# Patient Record
Sex: Female | Born: 1965 | Race: Black or African American | Hispanic: No | Marital: Single | Smoking: Former smoker
Health system: Southern US, Community
[De-identification: ages and names within clinical notes are randomized; demographics above are authoritative.]

## PROBLEM LIST (undated history)

## (undated) DIAGNOSIS — R011 Cardiac murmur, unspecified: Secondary | ICD-10-CM

## (undated) DIAGNOSIS — F32A Depression, unspecified: Secondary | ICD-10-CM

## (undated) DIAGNOSIS — F419 Anxiety disorder, unspecified: Secondary | ICD-10-CM

## (undated) DIAGNOSIS — G629 Polyneuropathy, unspecified: Secondary | ICD-10-CM

## (undated) DIAGNOSIS — M1990 Unspecified osteoarthritis, unspecified site: Secondary | ICD-10-CM

## (undated) DIAGNOSIS — E785 Hyperlipidemia, unspecified: Secondary | ICD-10-CM

## (undated) DIAGNOSIS — I219 Acute myocardial infarction, unspecified: Secondary | ICD-10-CM

## (undated) DIAGNOSIS — I2510 Atherosclerotic heart disease of native coronary artery without angina pectoris: Secondary | ICD-10-CM

## (undated) DIAGNOSIS — K219 Gastro-esophageal reflux disease without esophagitis: Secondary | ICD-10-CM

## (undated) DIAGNOSIS — K449 Diaphragmatic hernia without obstruction or gangrene: Secondary | ICD-10-CM

## (undated) DIAGNOSIS — I10 Essential (primary) hypertension: Secondary | ICD-10-CM

## (undated) DIAGNOSIS — Z720 Tobacco use: Secondary | ICD-10-CM

## (undated) DIAGNOSIS — E079 Disorder of thyroid, unspecified: Secondary | ICD-10-CM

## (undated) DIAGNOSIS — I714 Abdominal aortic aneurysm, without rupture, unspecified: Secondary | ICD-10-CM

## (undated) DIAGNOSIS — E119 Type 2 diabetes mellitus without complications: Secondary | ICD-10-CM

## (undated) DIAGNOSIS — R232 Flushing: Secondary | ICD-10-CM

## (undated) DIAGNOSIS — N189 Chronic kidney disease, unspecified: Secondary | ICD-10-CM

## (undated) DIAGNOSIS — C801 Malignant (primary) neoplasm, unspecified: Secondary | ICD-10-CM

## (undated) HISTORY — PX: ABDOMINAL AORTIC ANEURYSM REPAIR: SUR1152

## (undated) HISTORY — DX: Malignant (primary) neoplasm, unspecified: C80.1

## (undated) HISTORY — DX: Depression, unspecified: F32.A

## (undated) HISTORY — DX: Anxiety disorder, unspecified: F41.9

## (undated) HISTORY — DX: Hyperlipidemia, unspecified: E78.5

## (undated) HISTORY — DX: Gastro-esophageal reflux disease without esophagitis: K21.9

## (undated) HISTORY — DX: Unspecified osteoarthritis, unspecified site: M19.90

## (undated) HISTORY — DX: Chronic kidney disease, unspecified: N18.9

## (undated) HISTORY — DX: Abdominal aortic aneurysm, without rupture, unspecified: I71.40

## (undated) HISTORY — DX: Type 2 diabetes mellitus without complications: E11.9

## (undated) HISTORY — DX: Flushing: R23.2

## (undated) HISTORY — DX: Cardiac murmur, unspecified: R01.1

## (undated) HISTORY — PX: BREAST BIOPSY: SHX20

## (undated) HISTORY — DX: Diaphragmatic hernia without obstruction or gangrene: K44.9

## (undated) HISTORY — DX: Disorder of thyroid, unspecified: E07.9

---

## 1984-10-20 HISTORY — PX: BREAST SURGERY: SHX581

## 1998-10-20 HISTORY — PX: SALPINGOOPHORECTOMY: SHX82

## 2005-05-10 ENCOUNTER — Emergency Department: Payer: Self-pay | Admitting: Emergency Medicine

## 2005-05-10 ENCOUNTER — Other Ambulatory Visit: Payer: Self-pay

## 2005-11-23 ENCOUNTER — Emergency Department: Payer: Self-pay | Admitting: Emergency Medicine

## 2006-08-31 ENCOUNTER — Emergency Department: Payer: Self-pay | Admitting: Emergency Medicine

## 2006-09-30 ENCOUNTER — Other Ambulatory Visit: Payer: Self-pay

## 2006-10-07 ENCOUNTER — Ambulatory Visit: Payer: Self-pay | Admitting: Vascular Surgery

## 2006-10-19 ENCOUNTER — Emergency Department: Payer: Self-pay | Admitting: General Practice

## 2007-12-08 ENCOUNTER — Emergency Department: Payer: Self-pay | Admitting: Internal Medicine

## 2008-03-20 ENCOUNTER — Emergency Department (HOSPITAL_COMMUNITY): Admission: EM | Admit: 2008-03-20 | Discharge: 2008-03-20 | Payer: Self-pay | Admitting: Emergency Medicine

## 2008-07-10 ENCOUNTER — Emergency Department: Payer: Self-pay | Admitting: Emergency Medicine

## 2008-07-10 ENCOUNTER — Other Ambulatory Visit: Payer: Self-pay

## 2008-09-27 ENCOUNTER — Emergency Department: Payer: Self-pay | Admitting: Emergency Medicine

## 2008-10-08 ENCOUNTER — Inpatient Hospital Stay: Payer: Self-pay | Admitting: *Deleted

## 2008-11-02 ENCOUNTER — Ambulatory Visit (HOSPITAL_COMMUNITY): Admission: RE | Admit: 2008-11-02 | Discharge: 2008-11-02 | Payer: Self-pay | Admitting: Obstetrics & Gynecology

## 2008-11-30 ENCOUNTER — Ambulatory Visit: Payer: Self-pay | Admitting: Obstetrics & Gynecology

## 2009-02-15 ENCOUNTER — Ambulatory Visit: Payer: Self-pay | Admitting: Family Medicine

## 2009-02-20 ENCOUNTER — Emergency Department: Payer: Self-pay | Admitting: Internal Medicine

## 2009-05-03 ENCOUNTER — Ambulatory Visit: Payer: Self-pay | Admitting: Obstetrics and Gynecology

## 2009-05-22 ENCOUNTER — Emergency Department: Payer: Self-pay | Admitting: Emergency Medicine

## 2009-07-19 ENCOUNTER — Ambulatory Visit: Payer: Self-pay | Admitting: Obstetrics & Gynecology

## 2009-10-04 ENCOUNTER — Ambulatory Visit: Payer: Self-pay | Admitting: Obstetrics & Gynecology

## 2009-12-20 ENCOUNTER — Ambulatory Visit: Payer: Self-pay | Admitting: Obstetrics and Gynecology

## 2010-02-19 ENCOUNTER — Emergency Department: Payer: Self-pay | Admitting: Unknown Physician Specialty

## 2010-03-13 ENCOUNTER — Ambulatory Visit: Payer: Self-pay | Admitting: Obstetrics and Gynecology

## 2010-03-13 LAB — CONVERTED CEMR LAB: Pap Smear: NEGATIVE

## 2010-03-26 ENCOUNTER — Ambulatory Visit (HOSPITAL_COMMUNITY): Admission: RE | Admit: 2010-03-26 | Discharge: 2010-03-26 | Payer: Self-pay | Admitting: Family Medicine

## 2010-05-29 ENCOUNTER — Ambulatory Visit: Payer: Self-pay | Admitting: Obstetrics & Gynecology

## 2011-03-04 NOTE — Group Therapy Note (Signed)
Heather Mahoney, DEASON NO.:  [id number]   MEDICAL RECORD NO.:  [id number]          PATIENT TYPE:  WOC   LOCATION:  [facility]                   FACILITY:  [facility]   PHYSICIAN:  Allie Bossier, MD        DATE OF BIRTH:  May 30, 1966   DATE OF SERVICE:                                  CLINIC NOTE   Ms. Emerick presents today for her Depo shot.  However, on evaluation,  her blood pressure was found to be 165/110 with a repeat 160/100.  I was  asked to evaluate the patient for elevated blood pressures.  The patient  relates a long history of elevated blood pressures with the use of  medications on occasion previously.  However, due to her social  situation and no current insurance, she has not been to the doctor or  been able to get medications anytime recently.  She denies any  headaches, dizziness, chest pain, or shortness of breath.   PHYSICAL EXAMINATION:  VITAL SIGNS:  Blood pressure is as noted.  NECK:  Her thyroid is normal size without nodules noted.  LUNGS:  Clear to auscultation.  CARDIOVASCULAR:  Regular without murmur.  EXTREMITIES:  She has no peripheral edema.   PAST MEDICAL HISTORY:  Significant for hypertension.   PAST SURGICAL HISTORY:  She has had last laparoscopy and laparotomy.   ALLERGIES:  She is not allergic any medications.   FAMILY HISTORY:  Diabetes, hypertension.   SOCIAL HISTORY:  She smokes one-half pack per day and denies alcohol  use.   ASSESSMENT AND PLAN:  Essential hypertension.  We will prescribe the  patient Tenoretic 50/25, this is on the [employer], 4 dollars, she will  take pill a day.  I strongly recommended to her that once her benefits  kick in, she find a primary care Cathie Bonnell for further management of this  issue.     ______________________________  Odie Sera, D.O.    ______________________________  Allie Bossier, MD    MC/MEDQ  D:  02/15/2009  T:  02/16/2009  Job:  [id number]

## 2011-03-04 NOTE — Group Therapy Note (Signed)
Heather Mahoney, GIBLER NO.:  [id number]   MEDICAL RECORD NO.:  [id number]          PATIENT TYPE:  WOC   LOCATION:  [facility]                   FACILITY:  [facility]   PHYSICIAN:  Scheryl Darter, MD       DATE OF BIRTH:  12/03/1965   DATE OF SERVICE:  11/30/2008                                  CLINIC NOTE   Patient is referred due to heavy periods.   Patient is a 45 year old black female, gravida 3, para 3, last menstrual  period November 20, 2008, who has had increasing problems with prolonged  heavy periods.  In May 2009 her period went from Mar 03, 2008 to March 22, 2008.  Her cycles are about 21 to 30 days.  Last several cycles have  been 5 to 7 days.  She wanted to start Depo-Provera but she was referred  for evaluation for her irregular heavy periods.   PAST MEDICAL HISTORY:  1. Heart murmur.  2. Hypertension, currently on no medication.   PAST SURGICAL HISTORY:  Laparoscopy followed by laparotomy and salpingo-  oophorectomy in February 2000 in [place].   FAMILY HISTORY:  Diabetes and hypertension.   ALLERGIES:  STRAWBERRIES.  No medical allergies.   SOCIAL HISTORY:  Patient is separated and sexually active.  Her last  intercourse was about 5 days ago.  She smokes 1/2 pack of cigarettes a  day for 28 years and drinks alcohol about twice a week and denies any  abuse.  No bleeding today or abnormal discharge.   PHYSICAL EXAMINATION:  Patient in no acute distress.  Normal affect.  Height is 65-1/4 inches, BP 157/100, pulse is 82, temperature is 97.7.  ABDOMEN:  Soft, nontender, no mass.  PELVIC:  External genitalia, vagina and cervix appear normal.  Uterus is  normal size, nontender, no adnexal masses.  Last Pap smear was normal.   IMPRESSION:  Increasing menstrual flow with some menstrual irregularity  and the need for contraception.   PLAN:  Patient had a negative pregnancy test today.  She received Depo-  Provera 150 mg IM.  Ultrasound will be scheduled  to check for uterine  anomalies or endometrial thickening.  She will continue with Depo-  Provera every 3 months.  We discussed options including Mirena and  Implanons.      Scheryl Darter, MD     JA/MEDQ  D:  11/30/2008  T:  11/30/2008  Job:  [id number]

## 2011-04-08 ENCOUNTER — Emergency Department: Payer: Self-pay | Admitting: Emergency Medicine

## 2011-04-09 ENCOUNTER — Emergency Department: Payer: Self-pay | Admitting: Emergency Medicine

## 2011-10-07 ENCOUNTER — Emergency Department: Payer: Self-pay | Admitting: *Deleted

## 2012-08-10 ENCOUNTER — Emergency Department: Payer: Self-pay | Admitting: Emergency Medicine

## 2012-08-13 LAB — BETA STREP CULTURE(ARMC)

## 2013-10-05 ENCOUNTER — Emergency Department: Payer: Self-pay | Admitting: Emergency Medicine

## 2013-10-08 ENCOUNTER — Emergency Department: Payer: Self-pay | Admitting: Emergency Medicine

## 2014-06-20 ENCOUNTER — Emergency Department: Payer: Self-pay | Admitting: Emergency Medicine

## 2014-11-22 ENCOUNTER — Emergency Department: Payer: Self-pay | Admitting: Emergency Medicine

## 2015-04-21 ENCOUNTER — Encounter: Payer: Self-pay | Admitting: Emergency Medicine

## 2015-04-21 ENCOUNTER — Emergency Department
Admission: EM | Admit: 2015-04-21 | Discharge: 2015-04-21 | Disposition: A | Discharge status: Home / Self Care | Payer: No Typology Code available for payment source | Attending: Emergency Medicine | Admitting: Emergency Medicine

## 2015-04-21 DIAGNOSIS — M542 Cervicalgia: Secondary | ICD-10-CM | POA: Insufficient documentation

## 2015-04-21 DIAGNOSIS — J029 Acute pharyngitis, unspecified: Secondary | ICD-10-CM | POA: Diagnosis not present

## 2015-04-21 DIAGNOSIS — R51 Headache: Secondary | ICD-10-CM | POA: Diagnosis present

## 2015-04-21 DIAGNOSIS — K047 Periapical abscess without sinus: Secondary | ICD-10-CM | POA: Insufficient documentation

## 2015-04-21 DIAGNOSIS — Z79899 Other long term (current) drug therapy: Secondary | ICD-10-CM | POA: Diagnosis not present

## 2015-04-21 DIAGNOSIS — Z720 Tobacco use: Secondary | ICD-10-CM | POA: Insufficient documentation

## 2015-04-21 DIAGNOSIS — H9202 Otalgia, left ear: Secondary | ICD-10-CM | POA: Diagnosis not present

## 2015-04-21 DIAGNOSIS — I10 Essential (primary) hypertension: Secondary | ICD-10-CM | POA: Insufficient documentation

## 2015-04-21 HISTORY — DX: Essential (primary) hypertension: I10

## 2015-04-21 MED ORDER — HYDROCODONE-ACETAMINOPHEN 5-325 MG PO TABS: 1.0000 | ORAL_TABLET | ORAL | Status: DC | PRN

## 2015-04-21 MED ORDER — AMOXICILLIN 500 MG PO CAPS
500.0000 mg | ORAL_CAPSULE | Freq: Once | ORAL | Status: AC
  Administered 2015-04-21: 500 mg via ORAL

## 2015-04-21 MED ORDER — HYDROCODONE-ACETAMINOPHEN 5-325 MG PO TABS
ORAL_TABLET | ORAL | Status: AC
  Administered 2015-04-21: 1 via ORAL
  Filled 2015-04-21: qty 1

## 2015-04-21 MED ORDER — AMOXICILLIN 500 MG PO CAPS
ORAL_CAPSULE | ORAL | Status: AC
  Filled 2015-04-21: qty 1

## 2015-04-21 MED ORDER — HYDROCODONE-ACETAMINOPHEN 5-325 MG PO TABS
1.0000 | ORAL_TABLET | Freq: Once | ORAL | Status: AC
  Administered 2015-04-21: 1 via ORAL

## 2015-04-21 MED ORDER — AMOXICILLIN 500 MG PO TABS: 500.0000 mg | ORAL_TABLET | Freq: Two times a day (BID) | ORAL | Status: DC

## 2015-04-21 NOTE — Discharge Instructions (Signed)

## 2015-04-21 NOTE — ED Notes (Signed)
Pt presents to ER alert and in NAD. Pt states left side facial pain. Pt reports pain worse when laying down. Pt has no weakness or drift noted to left side, but states she is not able to smile due to pain.

## 2015-04-21 NOTE — ED Provider Notes (Signed)
[facility] Emergency Department Provider Note ____________________________________________  Time seen: Approximately 9:46 PM  I have reviewed the triage vital signs and the nursing notes.   HISTORY  Chief Complaint Facial Pain; Sore Throat; and Otalgia  HPI Heather Mahoney is a 49 y.o. female who presents to the emergency department for left-sided facial pain, left earache and pain to the left neck with swallowing. She states that she has a wisdom tooth that has been broken for quite some time. She rinses with Listerine and peroxide daily to help prevent infection, but she feels that it is now infected despite that. She denies fevers, nausea, vomiting. She denies difficulty hearing. She states that the pain in her throat is only with swallowing. She has not taken anything over-the-counter.   Past Medical History  Diagnosis Date  . Hypertension     There are no active problems to display for this patient.   Past Surgical History  Procedure Laterality Date  . Oophorectomy    . Breast surgery      Current Outpatient Rx  Name  Route  Sig  Dispense  Refill  . hydrochlorothiazide (MICROZIDE) 12.5 MG capsule   Oral   Take 12.5 mg by mouth daily.         Marland Kitchen amoxicillin (AMOXIL) 500 MG tablet   Oral   Take 1 tablet (500 mg total) by mouth 2 (two) times daily.   30 tablet   0   . HYDROcodone-acetaminophen (NORCO/VICODIN) 5-325 MG per tablet   Oral   Take 1 tablet by mouth every 4 (four) hours as needed for moderate pain.   12 tablet   0     Allergies Review of patient's allergies indicates no known allergies.  No family history on file.  Social History History  Substance Use Topics  . Smoking status: Current Every Day Smoker  . Smokeless tobacco: Not on file  . Alcohol Use: No    Review of Systems Constitutional: No fever/chills Eyes: No visual changes. ENT: No sore throat. Cardiovascular: Denies chest pain. Respiratory: Denies  shortness of breath. Gastrointestinal: No abdominal pain.  No nausea, no vomiting.  Genitourinary: Negative for dysuria. Musculoskeletal: Negative for back pain. Skin: Negative for rash. Neurological: Negative for headaches, focal weakness or numbness. 10-point ROS otherwise negative.  ____________________________________________   PHYSICAL EXAM:  VITAL SIGNS: ED Triage Vitals  Enc Vitals Group     BP 04/21/15 1952 172/117 mmHg     Pulse Rate 04/21/15 1952 78     Resp 04/21/15 1952 20     Temp 04/21/15 1952 98.5 F (36.9 C)     Temp Source 04/21/15 1952 Oral     SpO2 04/21/15 1952 98 %     Weight 04/21/15 1952 114 lb (51.71 kg)     Height 04/21/15 1952 5\' 7"  (1.702 m)     Head Cir --      Peak Flow --      Pain Score 04/21/15 1953 5     Pain Loc --      Pain Edu? --      Excl. in [place]? --     Constitutional: Alert and oriented. Well appearing and in no acute distress. Eyes: Conjunctivae are normal. PERRL. EOMI. Head: Atraumatic. Nose: No congestion/rhinnorhea. Mouth/Throat: Mucous membranes are moist.  Oropharynx non-erythematous. Periodontal Exam    Neck: No stridor.  Hematological/Lymphatic/Immunilogical: No cervical lymphadenopathy. Cardiovascular:   Good peripheral circulation. Respiratory: Normal respiratory effort.  No retractions. Musculoskeletal: No lower extremity  tenderness nor edema.  No joint effusions. Neurologic:  Normal speech and language. No gross focal neurologic deficits are appreciated. Speech is normal. No gait instability. Skin:  Skin is warm, dry and intact. No rash noted. Psychiatric: Mood and affect are normal. Speech and behavior are normal.  ____________________________________________   LABS (all labs ordered are listed, but only abnormal results are displayed)  Labs Reviewed - No data to display ____________________________________________   RADIOLOGY  Not  indicated ____________________________________________   PROCEDURES  Procedure(s) performed: None  Critical Care performed: No  ____________________________________________   INITIAL IMPRESSION / ASSESSMENT AND PLAN / ED COURSE  Pertinent labs & imaging results that were available during my care of the patient were reviewed by me and considered in my medical decision making (see chart for details).  Patient was advised to see the dentist within 14 days. Also advised to take the antibiotic until finished. Instructed to return to the ER for symptoms that change or worsen if you are unable to schedule an appointment.  She was also advised to make sure she takes her hypertension medications as prescribed. She was advised to follow-up with her primary care provider to recheck blood pressure. She states that it increases when she is in pain and feels that this is the reason why it is increased today. ____________________________________________   FINAL CLINICAL IMPRESSION(S) / ED DIAGNOSES  Final diagnoses:  Dental abscess       Victorino Dike, FNP 04/21/15 2150  Nena Polio, MD 04/22/15 [phone]

## 2015-04-25 ENCOUNTER — Other Ambulatory Visit: Payer: Self-pay

## 2015-04-25 ENCOUNTER — Emergency Department
Admission: EM | Admit: 2015-04-25 | Discharge: 2015-04-25 | Disposition: A | Discharge status: Home / Self Care | Payer: No Typology Code available for payment source | Attending: Emergency Medicine | Admitting: Emergency Medicine

## 2015-04-25 ENCOUNTER — Emergency Department: Payer: No Typology Code available for payment source

## 2015-04-25 DIAGNOSIS — Z79899 Other long term (current) drug therapy: Secondary | ICD-10-CM | POA: Diagnosis not present

## 2015-04-25 DIAGNOSIS — Z720 Tobacco use: Secondary | ICD-10-CM | POA: Diagnosis not present

## 2015-04-25 DIAGNOSIS — I10 Essential (primary) hypertension: Secondary | ICD-10-CM | POA: Insufficient documentation

## 2015-04-25 DIAGNOSIS — R519 Headache, unspecified: Secondary | ICD-10-CM

## 2015-04-25 DIAGNOSIS — R51 Headache: Secondary | ICD-10-CM

## 2015-04-25 DIAGNOSIS — Z792 Long term (current) use of antibiotics: Secondary | ICD-10-CM | POA: Diagnosis not present

## 2015-04-25 DIAGNOSIS — G43909 Migraine, unspecified, not intractable, without status migrainosus: Secondary | ICD-10-CM | POA: Diagnosis not present

## 2015-04-25 LAB — CBC WITH DIFFERENTIAL/PLATELET
Basophils Absolute: 0.1 10*3/uL (ref 0–0.1)
Basophils Relative: 1 %
Eosinophils Absolute: 0.3 10*3/uL (ref 0–0.7)
Eosinophils Relative: 3 %
HCT: 38.3 % (ref 35.0–47.0)
Hemoglobin: 12.4 g/dL (ref 12.0–16.0)
LYMPHS ABS: 3.4 10*3/uL (ref 1.0–3.6)
Lymphocytes Relative: 28 %
MCH: 28 pg (ref 26.0–34.0)
MCHC: 32.3 g/dL (ref 32.0–36.0)
MCV: 86.7 fL (ref 80.0–100.0)
MONO ABS: 0.5 10*3/uL (ref 0.2–0.9)
MONOS PCT: 5 %
NEUTROS ABS: 7.6 10*3/uL — AB (ref 1.4–6.5)
NEUTROS PCT: 63 %
PLATELETS: 376 10*3/uL (ref 150–440)
RBC: 4.42 MIL/uL (ref 3.80–5.20)
RDW: 15.8 % — AB (ref 11.5–14.5)
WBC: 12 10*3/uL — AB (ref 3.6–11.0)

## 2015-04-25 LAB — BASIC METABOLIC PANEL
ANION GAP: 10 (ref 5–15)
BUN: 8 mg/dL (ref 6–20)
CALCIUM: 9.4 mg/dL (ref 8.9–10.3)
CHLORIDE: 103 mmol/L (ref 101–111)
CO2: 28 mmol/L (ref 22–32)
Creatinine, Ser: 0.72 mg/dL (ref 0.44–1.00)
GFR calc Af Amer: >60 mL/min (ref >60)
GFR calc non Af Amer: >60 mL/min (ref >60)
GLUCOSE: 104 mg/dL — AB (ref 65–99)
POTASSIUM: 3.5 mmol/L (ref 3.5–5.1)
SODIUM: 141 mmol/L (ref 135–145)

## 2015-04-25 MED ORDER — METOPROLOL TARTRATE 1 MG/ML IV SOLN
2.5000 mg | Freq: Once | INTRAVENOUS | Status: AC
  Administered 2015-04-25: 2.5 mg via INTRAVENOUS

## 2015-04-25 MED ORDER — KETOROLAC TROMETHAMINE 30 MG/ML IJ SOLN
INTRAMUSCULAR | Status: AC
  Administered 2015-04-25: 30 mg via INTRAVENOUS
  Filled 2015-04-25: qty 1

## 2015-04-25 MED ORDER — SODIUM CHLORIDE 0.9 % IV BOLUS (SEPSIS)
500.0000 mL | Freq: Once | INTRAVENOUS | Status: AC
  Administered 2015-04-25: 500 mL via INTRAVENOUS

## 2015-04-25 MED ORDER — KETOROLAC TROMETHAMINE 30 MG/ML IJ SOLN
30.0000 mg | Freq: Once | INTRAMUSCULAR | Status: AC
  Administered 2015-04-25: 30 mg via INTRAVENOUS

## 2015-04-25 MED ORDER — METOCLOPRAMIDE HCL 5 MG/ML IJ SOLN
10.0000 mg | Freq: Once | INTRAMUSCULAR | Status: AC
  Administered 2015-04-25: 10 mg via INTRAVENOUS

## 2015-04-25 MED ORDER — METOCLOPRAMIDE HCL 5 MG/ML IJ SOLN
INTRAMUSCULAR | Status: AC
  Administered 2015-04-25: 10 mg via INTRAVENOUS
  Filled 2015-04-25: qty 2

## 2015-04-25 MED ORDER — METOPROLOL TARTRATE 1 MG/ML IV SOLN
INTRAVENOUS | Status: AC
  Administered 2015-04-25: 2.5 mg via INTRAVENOUS
  Filled 2015-04-25: qty 5

## 2015-04-25 NOTE — ED Notes (Signed)
Patient woke up at 4am this morning with headache.  She denies taking any medication.  She reports going to work out and seeing stars when going to for a walk.

## 2015-04-25 NOTE — Discharge Instructions (Signed)
It is unclear what initially started your headache. The head CT was okay. We treated you with some antimigraine medications. You felt much better after Reglan and Toradol. Continue your current anti-blood pressure medication. You may need to take an additional medication as well. Follow-up with regular doctor for further evaluation for this. Return to the emergency department if you have worsening symptoms, uncontrolled pain, or other urgent concerns.  General Headache Without Cause A general headache is pain or discomfort felt around the head or neck area. The cause may not be found.  HOME CARE   Keep all doctor visits.  Only take medicines as told by your doctor.  Lie down in a dark, quiet room when you have a headache.  Keep a journal to find out if certain things bring on headaches. For example, write down:  What you eat and drink.  How much sleep you get.  Any change to your diet or medicines.  Relax by getting a massage or doing other relaxing activities.  Put ice or heat packs on the head and neck area as told by your doctor.  Lessen stress.  Sit up straight. Do not tighten (tense) your muscles.  Quit smoking if you smoke.  Lessen how much alcohol you drink.  Lessen how much caffeine you drink, or stop drinking caffeine.  Eat and sleep on a regular schedule.  Get 7 to 9 hours of sleep, or as told by your doctor.  Keep lights dim if bright lights bother you or make your headaches worse. GET HELP RIGHT AWAY IF:   Your headache becomes really bad.  You have a fever.  You have a stiff neck.  You have trouble seeing.  Your muscles are weak, or you lose muscle control.  You lose your balance or have trouble walking.  You feel like you will pass out (faint), or you pass out.  You have really bad symptoms that are different than your first symptoms.  You have problems with the medicines given to you by your doctor.  Your medicines do not work.  Your  headache feels different than the other headaches.  You feel sick to your stomach (nauseous) or throw up (vomit). MAKE SURE YOU:   Understand these instructions.  Will watch your condition.  Will get help right away if you are not doing well or get worse. Document Released: 07/15/2008 Document Revised: 12/29/2011 Document Reviewed: 09/26/2011 [facility] Patient Information 2015 [place], [place]. This information is not intended to replace advice given to you by your health care provider. Make sure you discuss any questions you have with your health care provider.

## 2015-04-25 NOTE — ED Provider Notes (Signed)
[facility] Emergency Department Provider Note  ____________________________________________  Time seen: 10:15 AM  I have reviewed the triage vital signs and the nursing notes.   HISTORY  Chief Complaint Headache     HPI Heather Mahoney is a 49 y.o. female who woke at 4 AM, her usual time, with a bit of a headache. She was doing all right until she got up to go do some exercise area and she was given ago for a walk. As soon as she stepped out her door and started going down the stairs the headache got worse and she saw stars and other visual defects.   The patient denies any weakness in her arms or legs. She is ambulatory to. She is alert and communicative. She denies nausea.  She has been seen in the emergency department recently due to a dental infection on the left.  Patient denies having headaches like this before. Acute in onset.    Past Medical History  Diagnosis Date  . Hypertension     There are no active problems to display for this patient.   Past Surgical History  Procedure Laterality Date  . Oophorectomy    . Breast surgery      Current Outpatient Rx  Name  Route  Sig  Dispense  Refill  . amoxicillin (AMOXIL) 500 MG tablet   Oral   Take 1 tablet (500 mg total) by mouth 2 (two) times daily.   30 tablet   0   . hydrochlorothiazide (MICROZIDE) 12.5 MG capsule   Oral   Take 12.5 mg by mouth daily.         Marland Kitchen HYDROcodone-acetaminophen (NORCO/VICODIN) 5-325 MG per tablet   Oral   Take 1 tablet by mouth every 4 (four) hours as needed for moderate pain.   12 tablet   0     Allergies Strawberry  No family history on file.  Social History History  Substance Use Topics  . Smoking status: Current Every Day Smoker  . Smokeless tobacco: Not on file  . Alcohol Use: No    Review of Systems  Constitutional: Negative for fever. ENT: Negative for sore throat. Cardiovascular: Negative for chest pain. Respiratory: Negative  for shortness of breath. Gastrointestinal: Negative for abdominal pain, vomiting and diarrhea. Genitourinary: Negative for dysuria. Musculoskeletal: No myalgias or injuries. Skin: Negative for rash. Neurological: Positive for headache. See history of present illness   10-point ROS otherwise negative.  ____________________________________________   PHYSICAL EXAM:  VITAL SIGNS: ED Triage Vitals  Enc Vitals Group     BP 04/25/15 0923 190/117 mmHg     Pulse Rate 04/25/15 0923 71     Resp 04/25/15 0923 14     Temp 04/25/15 0923 98.3 F (36.8 C)     Temp Source 04/25/15 0923 Oral     SpO2 04/25/15 0923 97 %     Weight 04/25/15 0923 143 lb (64.864 kg)     Height 04/25/15 0923 5\' 4"  (1.626 m)     Head Cir --      Peak Flow --      Pain Score 04/25/15 0924 6     Pain Loc --      Pain Edu? --      Excl. in [place]? --     Constitutional: Alert and oriented. Well appearing and in no distress. ENT   Head: Normocephalic and atraumatic.   Nose: No congestion/rhinnorhea.   Mouth/Throat: Mucous membranes are moist.     Ears:  Normal canals except for a small amount of older oxidized wax, normal TM's, no erythema. Cardiovascular: Normal rate, regular rhythm, no murmur noted Respiratory:  Normal respiratory effort, no tachypnea.    Breath sounds are clear and equal bilaterally.  Gastrointestinal: Soft and nontender. No distention.  Back: No muscle spasm, no tenderness, no CVA tenderness. Musculoskeletal: No deformity noted. Nontender with normal range of motion in all extremities.  No noted edema. Neurologic:  Normal speech and language. No gross focal neurologic deficits are appreciated.  Alert, communicative, no acute distress, pucker 5 strength in all 4 extremities, equal grip strength bilaterally. Good finger to nose, negative pronator drift. Intact sensation. Cranial nerves II through XII are intact. Skin:  Skin is warm, dry. No rash noted. Psychiatric: Mood and affect are  normal. Speech and behavior are normal.  ____________________________________________    LABS (pertinent positives/negatives)  Labs Reviewed  CBC WITH DIFFERENTIAL/PLATELET - Abnormal; Notable for the following:    WBC 12.0 (*)    RDW 15.8 (*)    Neutro Abs 7.6 (*)    All other components within normal limits  BASIC METABOLIC PANEL - Abnormal; Notable for the following:    Glucose, Bld 104 (*)    All other components within normal limits     ____________________________________________   EKG  ED ECG REPORT I, Jaeleigh Monaco W, the attending physician, personally viewed and interpreted this ECG.   Date: 04/25/2015  EKG Time: 9:13 AM  Rate: 69  Rhythm: sinus rhythm with 2 PVCs noted on this trip.  Axis: Normal  Intervals: Normal  ST&T Change: None noted   ____________________________________________    RADIOLOGY  CT head: IMPRESSION: Negative noncontrast CT appearance of the brain when allowing for mild streak artifact through the posterior hemispheres.  ____________________________________________   INITIAL IMPRESSION / ASSESSMENT AND PLAN / ED COURSE  Pertinent labs & imaging results that were available during my care of the patient were reviewed by me and considered in my medical decision making (see chart for details).  Alert, well-appearing 48 year old female in no acute distress with a negative neuro exam. The patient may be having a migraine. We will treat her with Reglan. The head CT overall looks normal. She'll receive a dose of Toradol. The blood pressure is elevated as high as 583 systolic. The patient reports she used to take atenolol, but that the dose was too strong for her. We've talked about using a lower dose which she would welcome. At this time I will treat her with metoprolol, 2.5 mg, IV, times one.  ----------------------------------------- 12:16 PM on 04/25/2015 -----------------------------------------  Patient feels much better. She is  up and ambulating to the restroom. She is ready for discharge.  It is unclear what caused the headache, but I do suspect it may have been a migraine-like situation.  I will ask her to follow with her primary doctor for ongoing care of her hypertension. 5 advised her to return to the emergency department if she has further distress or any other urgent concerns.   ____________________________________________   FINAL CLINICAL IMPRESSION(S) / ED DIAGNOSES  Final diagnoses:  Migraine without status migrainosus, not intractable, unspecified migraine type  Essential hypertension       Ahmed Prima, MD 04/25/15 1220

## 2015-04-25 NOTE — ED Notes (Signed)
MD at bedside. 

## 2015-04-25 NOTE — ED Notes (Signed)
"  clear specs" in visual field, nausea with no vomiting. Headache that is on left side and radiates to whole head since pt awoke at 0400AM. Pt alert and oriented X4, active, cooperative, pt in NAD. RR even and unlabored, color WNL.

## 2015-07-22 ENCOUNTER — Emergency Department: Payer: No Typology Code available for payment source

## 2015-07-22 ENCOUNTER — Emergency Department
Admission: EM | Admit: 2015-07-22 | Discharge: 2015-07-22 | Disposition: A | Discharge status: Home / Self Care | Payer: No Typology Code available for payment source | Attending: Emergency Medicine | Admitting: Emergency Medicine

## 2015-07-22 ENCOUNTER — Encounter: Payer: Self-pay | Admitting: *Deleted

## 2015-07-22 DIAGNOSIS — Y92410 Unspecified street and highway as the place of occurrence of the external cause: Secondary | ICD-10-CM | POA: Diagnosis not present

## 2015-07-22 DIAGNOSIS — S199XXA Unspecified injury of neck, initial encounter: Secondary | ICD-10-CM | POA: Diagnosis present

## 2015-07-22 DIAGNOSIS — Z79899 Other long term (current) drug therapy: Secondary | ICD-10-CM | POA: Insufficient documentation

## 2015-07-22 DIAGNOSIS — S4992XA Unspecified injury of left shoulder and upper arm, initial encounter: Secondary | ICD-10-CM | POA: Insufficient documentation

## 2015-07-22 DIAGNOSIS — S161XXA Strain of muscle, fascia and tendon at neck level, initial encounter: Secondary | ICD-10-CM | POA: Insufficient documentation

## 2015-07-22 DIAGNOSIS — Y998 Other external cause status: Secondary | ICD-10-CM | POA: Diagnosis not present

## 2015-07-22 DIAGNOSIS — Y9389 Activity, other specified: Secondary | ICD-10-CM | POA: Insufficient documentation

## 2015-07-22 DIAGNOSIS — Z720 Tobacco use: Secondary | ICD-10-CM | POA: Insufficient documentation

## 2015-07-22 DIAGNOSIS — I10 Essential (primary) hypertension: Secondary | ICD-10-CM | POA: Insufficient documentation

## 2015-07-22 MED ORDER — IBUPROFEN 800 MG PO TABS: 800.0000 mg | ORAL_TABLET | Freq: Three times a day (TID) | ORAL | Status: DC | PRN

## 2015-07-22 MED ORDER — CYCLOBENZAPRINE HCL 10 MG PO TABS: 10.0000 mg | ORAL_TABLET | Freq: Three times a day (TID) | ORAL | Status: DC | PRN
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
## 2015-11-23 ENCOUNTER — Emergency Department: Payer: BLUE CROSS/BLUE SHIELD

## 2015-11-23 ENCOUNTER — Emergency Department
Admission: EM | Admit: 2015-11-23 | Discharge: 2015-11-23 | Disposition: A | Discharge status: Home / Self Care | Payer: BLUE CROSS/BLUE SHIELD | Attending: Emergency Medicine | Admitting: Emergency Medicine

## 2015-11-23 ENCOUNTER — Encounter: Payer: Self-pay | Admitting: Emergency Medicine

## 2015-11-23 DIAGNOSIS — S8001XA Contusion of right knee, initial encounter: Secondary | ICD-10-CM

## 2015-11-23 DIAGNOSIS — Z79899 Other long term (current) drug therapy: Secondary | ICD-10-CM | POA: Insufficient documentation

## 2015-11-23 DIAGNOSIS — Y998 Other external cause status: Secondary | ICD-10-CM | POA: Diagnosis not present

## 2015-11-23 DIAGNOSIS — I10 Essential (primary) hypertension: Secondary | ICD-10-CM | POA: Insufficient documentation

## 2015-11-23 DIAGNOSIS — Y92481 Parking lot as the place of occurrence of the external cause: Secondary | ICD-10-CM | POA: Diagnosis not present

## 2015-11-23 DIAGNOSIS — W010XXA Fall on same level from slipping, tripping and stumbling without subsequent striking against object, initial encounter: Secondary | ICD-10-CM | POA: Insufficient documentation

## 2015-11-23 DIAGNOSIS — Y9389 Activity, other specified: Secondary | ICD-10-CM | POA: Insufficient documentation

## 2015-11-23 DIAGNOSIS — S8991XA Unspecified injury of right lower leg, initial encounter: Secondary | ICD-10-CM | POA: Diagnosis present

## 2015-11-23 DIAGNOSIS — F17210 Nicotine dependence, cigarettes, uncomplicated: Secondary | ICD-10-CM | POA: Insufficient documentation

## 2015-11-23 MED ORDER — HYDROCODONE-ACETAMINOPHEN 5-325 MG PO TABS: 1.0000 | ORAL_TABLET | ORAL | Status: DC | PRN

## 2015-11-23 MED ORDER — HYDROCODONE-ACETAMINOPHEN 5-325 MG PO TABS
1.0000 | ORAL_TABLET | Freq: Once | ORAL | Status: AC
  Administered 2015-11-23: 1 via ORAL
  Filled 2015-11-23: qty 1

## 2015-11-23 NOTE — Discharge Instructions (Signed)
Contusion A contusion is a deep bruise. Contusions happen when an injury causes bleeding under the skin. Symptoms of bruising include pain, swelling, and discolored skin. The skin may turn blue, purple, or yellow. HOME CARE   Rest the injured area.  If told, put ice on the injured area.  Put ice in a plastic bag.  Place a towel between your skin and the bag.  Leave the ice on for 20 minutes, 2-3 times per day.  If told, put light pressure (compression) on the injured area using an elastic bandage. Make sure the bandage is not too tight. Remove it and put it back on as told by your doctor.  If possible, raise (elevate) the injured area above the level of your heart while you are sitting or lying down.  Take over-the-counter and prescription medicines only as told by your doctor. GET HELP IF:  Your symptoms do not get better after several days of treatment.  Your symptoms get worse.  You have trouble moving the injured area. GET HELP RIGHT AWAY IF:   You have very bad pain.  You have a loss of feeling (numbness) in a hand or foot.  Your hand or foot turns pale or cold.   This information is not intended to replace advice given to you by your health care provider. Make sure you discuss any questions you have with your health care provider.   Document Released: 03/24/2008 Document Revised: 06/27/2015 Document Reviewed: 02/21/2015 Elsevier Interactive Patient Education 2016 [place] and elevate leg as needed for pain. Take Norco needed for pain only as directed. Use crutches as needed for walking. Follow-up with your doctor or Dr. Marry Guan if any continued problems.
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
## 2015-11-29 ENCOUNTER — Ambulatory Visit (INDEPENDENT_AMBULATORY_CARE_PROVIDER_SITE_OTHER): Payer: BLUE CROSS/BLUE SHIELD | Admitting: Family Medicine

## 2015-11-29 ENCOUNTER — Encounter: Payer: Self-pay | Admitting: Family Medicine

## 2015-11-29 ENCOUNTER — Emergency Department
Admission: EM | Admit: 2015-11-29 | Discharge: 2015-11-29 | Disposition: A | Discharge status: Home / Self Care | Payer: BLUE CROSS/BLUE SHIELD | Attending: Emergency Medicine | Admitting: Emergency Medicine

## 2015-11-29 ENCOUNTER — Emergency Department: Payer: BLUE CROSS/BLUE SHIELD

## 2015-11-29 ENCOUNTER — Encounter: Payer: Self-pay | Admitting: Intensive Care

## 2015-11-29 VITALS — BP 146/104 | HR 102 | Temp 98.1°F | Resp 16 | Ht 67.0 in | Wt 144.0 lb

## 2015-11-29 DIAGNOSIS — F17210 Nicotine dependence, cigarettes, uncomplicated: Secondary | ICD-10-CM | POA: Insufficient documentation

## 2015-11-29 DIAGNOSIS — R1900 Intra-abdominal and pelvic swelling, mass and lump, unspecified site: Secondary | ICD-10-CM

## 2015-11-29 DIAGNOSIS — I10 Essential (primary) hypertension: Secondary | ICD-10-CM | POA: Diagnosis present

## 2015-11-29 DIAGNOSIS — Z23 Encounter for immunization: Secondary | ICD-10-CM

## 2015-11-29 DIAGNOSIS — I160 Hypertensive urgency: Secondary | ICD-10-CM | POA: Insufficient documentation

## 2015-11-29 LAB — BASIC METABOLIC PANEL
ANION GAP: 9 (ref 5–15)
BUN: 13 mg/dL (ref 6–20)
CO2: 25 mmol/L (ref 22–32)
Calcium: 9.4 mg/dL (ref 8.9–10.3)
Chloride: 105 mmol/L (ref 101–111)
Creatinine, Ser: 0.54 mg/dL (ref 0.44–1.00)
GFR calc Af Amer: >60 mL/min (ref >60)
GLUCOSE: 103 mg/dL — AB (ref 65–99)
POTASSIUM: 3.8 mmol/L (ref 3.5–5.1)
Sodium: 139 mmol/L (ref 135–145)

## 2015-11-29 MED ORDER — HYDROCHLOROTHIAZIDE 12.5 MG PO CAPS: 12.5000 mg | ORAL_CAPSULE | Freq: Every day | ORAL | Status: DC

## 2015-11-29 MED ORDER — HYDROCHLOROTHIAZIDE 12.5 MG PO CAPS
12.5000 mg | ORAL_CAPSULE | ORAL | Status: AC
  Administered 2015-11-29: 12.5 mg via ORAL
  Filled 2015-11-29: qty 1

## 2015-11-29 NOTE — Progress Notes (Signed)
Name: Heather Mahoney   MRN: [id number]    DOB: 11/10/1965   Date:11/29/2015       Progress Note  Subjective  Chief Complaint  Chief Complaint  Patient presents with  . Establish Care  . Hypertension    was on HCTZ    Hypertension This is a chronic problem. The problem has been rapidly worsening since onset. The problem is uncontrolled. Associated symptoms include headaches (had a headache past couple of days, but not today). Pertinent negatives include no blurred vision, chest pain, palpitations, peripheral edema or shortness of breath. Past treatments include diuretics and beta blockers (HCTZ Rx by [facility] in [place] 2-3 years ago.). There is no history of kidney disease, CAD/MI or CVA.    Pt. Is here to establish care. No previous PCP.  Past Medical History  Diagnosis Date  . GERD (gastroesophageal reflux disease)   . Hypertension     On Hydrochlorothiazide    Past Surgical History  Procedure Laterality Date  . Breast surgery  1986    I&D for 'milk duct'  . Salpingoophorectomy Left 2000    Family History  Problem Relation Age of Onset  . Diabetes Mother   . Hypertension Mother   . Hyperlipidemia Mother   . Heart disease Mother     CABG X 4  . Asthma Mother   . Sarcoidosis Mother     In remission  . Diabetes Maternal Grandmother   . Heart disease Maternal Grandmother   . Hyperlipidemia Maternal Grandmother     Social History   Social History  . Marital Status: Married    Spouse Name: N/A  . Number of Children: N/A  . Years of Education: N/A   Occupational History  . Not on file.   Social History Main Topics  . Smoking status: Current Every Day Smoker -- 0.50 packs/day    Types: Cigarettes  . Smokeless tobacco: Not on file  . Alcohol Use: No  . Drug Use: Yes    Special: Marijuana     Comment: smokes Marijuana 'when I cant sleep, maybe once or twice a week'  . Sexual Activity: Yes   Other Topics Concern  . Not on file   Social History  Narrative    No current outpatient prescriptions on file.  Allergies  Allergen Reactions  . Strawberry Extract      Review of Systems  Eyes: Negative for blurred vision and double vision.  Respiratory: Negative for shortness of breath.   Cardiovascular: Negative for chest pain, palpitations and leg swelling.  Gastrointestinal: Negative for nausea and vomiting.  Neurological: Positive for headaches (had a headache past couple of days, but not today).     Objective  Filed Vitals:   11/29/15 0836  BP: 146/104  Pulse: 102  Temp: 98.1 F (36.7 C)  TempSrc: Oral  Resp: 16  Height: 5\' 7"  (1.702 m)  Weight: 144 lb (65.318 kg)  SpO2: 98%    Physical Exam  Constitutional: She is oriented to person, place, and time and well-developed, well-nourished, and in no distress.  HENT:  Head: Normocephalic and atraumatic.  Cardiovascular: Regular rhythm.   Murmur heard.  Systolic murmur is present with a grade of 3/6  Regular rhythm with pauses every 4-5 beats.  Abdominal: Soft. She exhibits pulsatile midline mass. There is tenderness in the epigastric area.    Tenderness to palpation over the gastric region, pulsatile asked like sensation palpated in the same area, concern for aortic aneurysm  Neurological: She  is alert and oriented to person, place, and time.  Psychiatric: Mood, memory, affect and judgment normal.  Nursing note and vitals reviewed.     Assessment & Plan  1. Needs flu shot  - Flu Vaccine QUAD 36+ mos PF IM (Fluarix & Fluzone Quad PF)  2. Hypertensive urgency Blood pressure elevated at 146/104, with headaches in the past few days, concern for hypertensive urgency. We will refer to ER for immediate treatment and follow-up.  3. Abdominal pulsatile mass Along with hypertensive urgency, concern for possible aortic aneurysm. We will refer to ER for further assessment and workup. Patient verbalized agreement with plan.    Kimani Hovis [facility] 11/29/2015 9:03 AM

## 2015-11-29 NOTE — ED Provider Notes (Signed)
[facility] Emergency Department Provider Note  ____________________________________________  Time seen: Approximately 10:21 AM  I have reviewed the triage vital signs and the nursing notes.   HISTORY  Chief Complaint Hypertension    HPI Heather Mahoney is a 50 y.o. female sent from primary doctor's office to rule out aneurysm of the abdomen.  Patient reports she was in her normal state of health and went in for her first physical exam and blood pressure follow-up, one her doctor felt a mass that was pulsating or abdomen. Patient denies any pain, chest pain or other concerns. She states she does have hypertension and was following up for that is not currently on any medicine. She's not had any back pain, numbness, tingling, weakness or other concerns.  Reports she was previously on hydrochlorothiazide but ran out a while ago. No headache or vision change. No nausea or vomiting.   Past Medical History  Diagnosis Date  . GERD (gastroesophageal reflux disease)   . Hypertension     On Hydrochlorothiazide    Patient Active Problem List   Diagnosis Date Noted  . Hypertensive urgency 11/29/2015  . Abdominal pulsatile mass 11/29/2015    Past Surgical History  Procedure Laterality Date  . Breast surgery  1986    I&D for 'milk duct'  . Salpingoophorectomy Left 2000    Current Outpatient Rx  Name  Route  Sig  Dispense  Refill  . hydrochlorothiazide (MICROZIDE) 12.5 MG capsule   Oral   Take 1 capsule (12.5 mg total) by mouth daily.   30 capsule   0     Allergies Strawberry extract  Family History  Problem Relation Age of Onset  . Diabetes Mother   . Hypertension Mother   . Hyperlipidemia Mother   . Heart disease Mother     CABG X 4  . Asthma Mother   . Sarcoidosis Mother     In remission  . Diabetes Maternal Grandmother   . Heart disease Maternal Grandmother   . Hyperlipidemia Maternal Grandmother     Social History Social History   Substance Use Topics  . Smoking status: Current Every Day Smoker -- 0.50 packs/day    Types: Cigarettes  . Smokeless tobacco: Never Used  . Alcohol Use: Yes    Review of Systems Constitutional: No fever/chills Eyes: No visual changes. ENT: No sore throat. Cardiovascular: Denies chest pain. Respiratory: Denies shortness of breath. Gastrointestinal: No abdominal pain.  No nausea, no vomiting.  No diarrhea.  No constipation. Genitourinary: Negative for dysuria. Musculoskeletal: Negative for back pain. Skin: Negative for rash. Neurological: Negative for headaches, focal weakness or numbness.  10-point ROS otherwise negative.  ____________________________________________   PHYSICAL EXAM:  VITAL SIGNS: ED Triage Vitals  Enc Vitals Group     BP 11/29/15 0957 196/134 mmHg     Pulse Rate 11/29/15 0957 75     Resp 11/29/15 0957 13     Temp 11/29/15 0957 98 F (36.7 C)     Temp Source 11/29/15 0957 Oral     SpO2 11/29/15 0957 100 %     Weight 11/29/15 0957 144 lb (65.318 kg)     Height 11/29/15 0957 5\' 7"  (1.702 m)     Head Cir --      Peak Flow --      Pain Score 11/29/15 0959 1     Pain Loc --      Pain Edu? --      Excl. in [place]? --  Constitutional: Alert and oriented. Well appearing and in no acute distress. Eyes: Conjunctivae are normal. PERRL. EOMI. Head: Atraumatic. Nose: No congestion/rhinnorhea. Mouth/Throat: Mucous membranes are moist.  Oropharynx non-erythematous. Neck: No stridor.   Cardiovascular: Normal rate, regular rhythm. Grossly normal heart sounds.  Good peripheral circulation. Respiratory: Normal respiratory effort.  No retractions. Lungs CTAB. Gastrointestinal: Soft and nontender. No distention. No abdominal bruits.Musculoskeletal: No lower extremity tenderness nor edema.  Neurologic:  Normal speech and language. No gross focal neurologic deficits are appreciated. No gait instability. Skin:  Skin is warm, dry and intact. No rash noted. Psychiatric:  Mood and affect are normal. Speech and behavior are normal.  ____________________________________________   LABS (all labs ordered are listed, but only abnormal results are displayed)  Labs Reviewed  BASIC METABOLIC PANEL - Abnormal; Notable for the following:    Glucose, Bld 103 (*)    All other components within normal limits   ____________________________________________  EKG   ____________________________________________  RADIOLOGY  IMPRESSION: No abdominal aortic aneurysm is noted. Palpable abnormality may be related to a previously seen fat containing umbilical hernia. Clinical correlation is recommended.   Electronically Signed By: Inez Catalina M.D. On: 11/29/2015 10:53       ____________________________________________   PROCEDURES  Procedure(s) performed: None  Critical Care performed: No  ____________________________________________   INITIAL IMPRESSION / ASSESSMENT AND PLAN / ED COURSE  Pertinent labs & imaging results that were available during my care of the patient were reviewed by me and considered in my medical decision making (see chart for details).  Patient presents asymptomatically. She was following up simply for blood pressure and primary care establishment and the primary is concerned about a possible abdominal aneurysm. She denies any symptoms that would be consistent with rupture. She does have hypertension, but does not experience any moving pain, ripping pain, abdominal pain, and has normal distal dorsalis pedis and posterior tibial pulses. No signs or symptoms to suggest acute dissection.  Her blood pressure is elevated, I'll place her back on hydrochlorothiazide which she was previously treated on.  ----------------------------------------- 12:41 PM on 11/29/2015 -----------------------------------------  Patient a symptomatic. We'll discharge the patient to home, return precautions and prescription for heart score thiazide  given. Reviewed ultrasound findings. Patient will follow-up with her primary care doctor.  Return precautions and treatment recommendations and follow-up discussed with the patient who is agreeable with the plan.  ____________________________________________   FINAL CLINICAL IMPRESSION(S) / ED DIAGNOSES  Final diagnoses:  Hypertensive urgency      Delman Kitten, MD 11/29/15 1241

## 2015-11-29 NOTE — ED Notes (Signed)
Patient was sent here by Dr. Gabriel Carina for pounding aorta on abdomen(worried about possible aneurysm), heart murmur and hypertension

## 2015-11-29 NOTE — ED Notes (Signed)
Patient transported to Ultrasound 

## 2015-11-29 NOTE — ED Notes (Signed)
Patient arrived by EMS from [place]. Patient was being seen at [facility] for High B/P Doctor became concerned about pulsating mass on abdomen (aorta) and heart murmur. Patient denies any SOB or chest pain.

## 2015-11-29 NOTE — ED Notes (Signed)
NAD noted at time of D/C. Pt denies questions or concerns. Pt ambulatory to the lobby at this time.  

## 2015-12-12 ENCOUNTER — Ambulatory Visit (INDEPENDENT_AMBULATORY_CARE_PROVIDER_SITE_OTHER): Payer: BLUE CROSS/BLUE SHIELD | Admitting: Family Medicine

## 2015-12-12 ENCOUNTER — Encounter: Payer: Self-pay | Admitting: Family Medicine

## 2015-12-12 VITALS — BP 142/99 | HR 116 | Temp 98.7°F | Resp 21 | Ht 67.0 in | Wt 145.5 lb

## 2015-12-12 DIAGNOSIS — I10 Essential (primary) hypertension: Secondary | ICD-10-CM | POA: Diagnosis not present

## 2015-12-12 DIAGNOSIS — I340 Nonrheumatic mitral (valve) insufficiency: Secondary | ICD-10-CM | POA: Insufficient documentation

## 2015-12-12 DIAGNOSIS — D473 Essential (hemorrhagic) thrombocythemia: Secondary | ICD-10-CM | POA: Diagnosis not present

## 2015-12-12 DIAGNOSIS — E559 Vitamin D deficiency, unspecified: Secondary | ICD-10-CM

## 2015-12-12 DIAGNOSIS — R011 Cardiac murmur, unspecified: Secondary | ICD-10-CM

## 2015-12-12 DIAGNOSIS — E782 Mixed hyperlipidemia: Secondary | ICD-10-CM | POA: Insufficient documentation

## 2015-12-12 DIAGNOSIS — E785 Hyperlipidemia, unspecified: Secondary | ICD-10-CM | POA: Diagnosis not present

## 2015-12-12 DIAGNOSIS — R7989 Other specified abnormal findings of blood chemistry: Secondary | ICD-10-CM | POA: Insufficient documentation

## 2015-12-12 MED ORDER — VITAMIN D (ERGOCALCIFEROL) 1.25 MG (50000 UNIT) PO CAPS: 50000.0000 [IU] | ORAL_CAPSULE | ORAL | Status: DC

## 2015-12-12 MED ORDER — ATORVASTATIN CALCIUM 10 MG PO TABS: 10.0000 mg | ORAL_TABLET | Freq: Every day | ORAL | Status: DC

## 2015-12-12 MED ORDER — LOSARTAN POTASSIUM-HCTZ 50-12.5 MG PO TABS: 1.0000 | ORAL_TABLET | Freq: Every day | ORAL | Status: DC

## 2015-12-12 NOTE — Progress Notes (Signed)
Name: Heather Mahoney   MRN: [id number]    DOB: 12-05-1965   Date:12/12/2015       Progress Note  Subjective  Chief Complaint  Chief Complaint  Patient presents with  . Hospitalization Follow-up  . Hypertension    Hypertension This is a chronic problem. The problem is uncontrolled. Pertinent negatives include no blurred vision, chest pain, headaches or palpitations. Past treatments include diuretics. The current treatment provides mild improvement. There is no history of kidney disease or CAD/MI.  Hyperlipidemia This is a new problem. This is a new diagnosis. Recent lipid tests were reviewed and are high (Lab results reviewed from 11/27/15, obtained at [facility], ordered by [employer] She has no history of diabetes. Pertinent negatives include no chest pain. She is currently on no antihyperlipidemic treatment.   Elevated Platelet count Reviewed lab work ordered on 11/27/15 by Roanna Raider. Platelet count is elevated at 416. No history of Hematologic disorders.  Vitamin D Deficiency Reviewed lab work ordered on 11/27/15 and Vitamin D level was 8.3 ng/mL. She will be started on Vitamin D therapy today.   Past Medical History  Diagnosis Date  . GERD (gastroesophageal reflux disease)   . Hypertension     On Hydrochlorothiazide    Past Surgical History  Procedure Laterality Date  . Breast surgery  1986    I&D for 'milk duct'  . Salpingoophorectomy Left 2000    Family History  Problem Relation Age of Onset  . Diabetes Mother   . Hypertension Mother   . Hyperlipidemia Mother   . Heart disease Mother     CABG X 4  . Asthma Mother   . Sarcoidosis Mother     In remission  . Diabetes Maternal Grandmother   . Heart disease Maternal Grandmother   . Hyperlipidemia Maternal Grandmother     Social History   Social History  . Marital Status: Married    Spouse Name: N/A  . Number of Children: N/A  . Years of Education: N/A   Occupational History  . Not on  file.   Social History Main Topics  . Smoking status: Current Every Day Smoker -- 0.50 packs/day    Types: Cigarettes  . Smokeless tobacco: Never Used  . Alcohol Use: Yes  . Drug Use: Yes    Special: Marijuana     Comment: smokes Marijuana 'when I cant sleep, maybe once or twice a week'  . Sexual Activity: Yes   Other Topics Concern  . Not on file   Social History Narrative     Current outpatient prescriptions:  .  hydrochlorothiazide (MICROZIDE) 12.5 MG capsule, Take 1 capsule (12.5 mg total) by mouth daily., Disp: 30 capsule, Rfl: 0  Allergies  Allergen Reactions  . Strawberry Extract      Review of Systems  Eyes: Negative for blurred vision.  Cardiovascular: Negative for chest pain and palpitations.  Neurological: Negative for headaches.    Objective  Filed Vitals:   12/12/15 1437  BP: 142/99  Pulse: 116  Temp: 98.7 F (37.1 C)  TempSrc: Oral  Resp: 21  Height: 5\' 7"  (1.702 m)  Weight: 145 lb 8 oz (65.998 kg)  SpO2: 98%    Physical Exam  Constitutional: She is oriented to person, place, and time and well-developed, well-nourished, and in no distress.  HENT:  Head: Normocephalic.  Cardiovascular: Normal rate, regular rhythm, S1 normal and S2 normal.   Murmur heard.  Systolic murmur is present with a grade of 2/6  Pulmonary/Chest: Effort normal and breath sounds normal.  Abdominal: Soft. Bowel sounds are normal.  Neurological: She is alert and oriented to person, place, and time.  Nursing note and vitals reviewed.   Assessment & Plan  1. Essential hypertension BP 2 elevated on hydrochlorothiazide 12.5 mg. Will add losartan 50 mg to antihypertensive regimen. Recheck in 4 weeks. - losartan-hydrochlorothiazide (HYZAAR) 50-12.5 MG tablet; Take 1 tablet by mouth daily.  Dispense: 90 tablet; Refill: 3  2. Hyperlipidemia We'll start on Lipitor 10 mg at bedtime for elevated cholesterol - atorvastatin (LIPITOR) 10 MG tablet; Take 1 tablet (10 mg total) by  mouth daily.  Dispense: 90 tablet; Refill: 0  3. Vitamin D deficiency Started on vitamin D 50,000 units weekly. - Vitamin D, Ergocalciferol, (DRISDOL) 50000 units CAPS capsule; Take 1 capsule (50,000 Units total) by mouth every 7 (seven) days.  Dispense: 12 capsule; Refill: 0  4. Elevated platelet count (HCC)  - CBC with Differential  5. Cardiac murmur  - Ambulatory referral to Cardiology   [facility] 12/12/2015 2:49 PM

## 2015-12-13 ENCOUNTER — Encounter: Payer: Self-pay | Admitting: Family Medicine

## 2015-12-13 LAB — CBC WITH DIFFERENTIAL/PLATELET
BASOS: 0 %
Basophils Absolute: 0 10*3/uL (ref 0.0–0.2)
EOS (ABSOLUTE): 0.3 10*3/uL (ref 0.0–0.4)
EOS: 3 %
HEMATOCRIT: 37.8 % (ref 34.0–46.6)
HEMOGLOBIN: 12.4 g/dL (ref 11.1–15.9)
IMMATURE GRANS (ABS): 0 10*3/uL (ref 0.0–0.1)
IMMATURE GRANULOCYTES: 0 %
LYMPHS: 39 %
Lymphocytes Absolute: 4.7 10*3/uL — ABNORMAL HIGH (ref 0.7–3.1)
MCH: 28.3 pg (ref 26.6–33.0)
MCHC: 32.8 g/dL (ref 31.5–35.7)
MCV: 86 fL (ref 79–97)
Monocytes Absolute: 0.8 10*3/uL (ref 0.1–0.9)
Monocytes: 7 %
NEUTROS ABS: 6.1 10*3/uL (ref 1.4–7.0)
NEUTROS PCT: 51 %
PLATELETS: 443 10*3/uL — AB (ref 150–379)
RBC: 4.38 x10E6/uL (ref 3.77–5.28)
RDW: 16.1 % — ABNORMAL HIGH (ref 12.3–15.4)
WBC: 12 10*3/uL — ABNORMAL HIGH (ref 3.4–10.8)

## 2015-12-13 NOTE — Addendum Note (Signed)
Addended byManuella Ghazi, Allean Montfort A A on: 12/13/2015 08:10 PM   Modules accepted: Orders, SmartSet

## 2015-12-21 ENCOUNTER — Inpatient Hospital Stay: Payer: BLUE CROSS/BLUE SHIELD | Attending: Hematology and Oncology | Admitting: Hematology and Oncology

## 2015-12-21 ENCOUNTER — Ambulatory Visit
Admission: RE | Admit: 2015-12-21 | Discharge: 2015-12-21 | Disposition: A | Discharge status: Home / Self Care | Payer: BLUE CROSS/BLUE SHIELD | Source: Ambulatory Visit | Attending: Hematology and Oncology | Admitting: Hematology and Oncology

## 2015-12-21 ENCOUNTER — Ambulatory Visit: Payer: BLUE CROSS/BLUE SHIELD

## 2015-12-21 VITALS — BP 124/85 | HR 79 | Temp 98.4°F | Resp 18 | Wt 143.5 lb

## 2015-12-21 DIAGNOSIS — R05 Cough: Secondary | ICD-10-CM | POA: Insufficient documentation

## 2015-12-21 DIAGNOSIS — I10 Essential (primary) hypertension: Secondary | ICD-10-CM

## 2015-12-21 DIAGNOSIS — D473 Essential (hemorrhagic) thrombocythemia: Secondary | ICD-10-CM | POA: Diagnosis not present

## 2015-12-21 DIAGNOSIS — M79605 Pain in left leg: Secondary | ICD-10-CM | POA: Insufficient documentation

## 2015-12-21 DIAGNOSIS — R7989 Other specified abnormal findings of blood chemistry: Secondary | ICD-10-CM

## 2015-12-21 DIAGNOSIS — R059 Cough, unspecified: Secondary | ICD-10-CM

## 2015-12-21 DIAGNOSIS — F17210 Nicotine dependence, cigarettes, uncomplicated: Secondary | ICD-10-CM

## 2015-12-21 DIAGNOSIS — R779 Abnormality of plasma protein, unspecified: Secondary | ICD-10-CM | POA: Insufficient documentation

## 2015-12-21 DIAGNOSIS — Z780 Asymptomatic menopausal state: Secondary | ICD-10-CM | POA: Diagnosis not present

## 2015-12-21 DIAGNOSIS — E8809 Other disorders of plasma-protein metabolism, not elsewhere classified: Secondary | ICD-10-CM | POA: Insufficient documentation

## 2015-12-21 DIAGNOSIS — K219 Gastro-esophageal reflux disease without esophagitis: Secondary | ICD-10-CM | POA: Diagnosis not present

## 2015-12-21 DIAGNOSIS — M79604 Pain in right leg: Secondary | ICD-10-CM | POA: Insufficient documentation

## 2015-12-21 DIAGNOSIS — D72820 Lymphocytosis (symptomatic): Secondary | ICD-10-CM | POA: Insufficient documentation

## 2015-12-21 DIAGNOSIS — R918 Other nonspecific abnormal finding of lung field: Secondary | ICD-10-CM | POA: Diagnosis not present

## 2015-12-21 DIAGNOSIS — R61 Generalized hyperhidrosis: Secondary | ICD-10-CM

## 2015-12-21 LAB — COMPREHENSIVE METABOLIC PANEL
ALT: 14 U/L (ref 14–54)
AST: 17 U/L (ref 15–41)
Albumin: 4.6 g/dL (ref 3.5–5.0)
Alkaline Phosphatase: 78 U/L (ref 38–126)
Anion gap: 8 (ref 5–15)
BUN: 19 mg/dL (ref 6–20)
CO2: 31 mmol/L (ref 22–32)
Calcium: 9.7 mg/dL (ref 8.9–10.3)
Chloride: 99 mmol/L — ABNORMAL LOW (ref 101–111)
Creatinine, Ser: 0.72 mg/dL (ref 0.44–1.00)
GFR calc Af Amer: >60 mL/min (ref >60)
GFR calc non Af Amer: >60 mL/min (ref >60)
Glucose, Bld: 94 mg/dL (ref 65–99)
Potassium: 4 mmol/L (ref 3.5–5.1)
Sodium: 138 mmol/L (ref 135–145)
Total Bilirubin: 0.4 mg/dL (ref 0.3–1.2)
Total Protein: 8.5 g/dL — ABNORMAL HIGH (ref 6.5–8.1)

## 2015-12-21 LAB — CBC WITH DIFFERENTIAL/PLATELET
Basophils Absolute: 0.1 10*3/uL (ref 0–0.1)
Basophils Relative: 1 %
Eosinophils Absolute: 0.4 10*3/uL (ref 0–0.7)
Eosinophils Relative: 3 %
HCT: 38.7 % (ref 35.0–47.0)
Hemoglobin: 13 g/dL (ref 12.0–16.0)
Lymphocytes Relative: 37 %
Lymphs Abs: 4.3 10*3/uL — ABNORMAL HIGH (ref 1.0–3.6)
MCH: 27.9 pg (ref 26.0–34.0)
MCHC: 33.6 g/dL (ref 32.0–36.0)
MCV: 83.2 fL (ref 80.0–100.0)
Monocytes Absolute: 0.8 10*3/uL (ref 0.2–0.9)
Monocytes Relative: 7 %
Neutro Abs: 6 10*3/uL (ref 1.4–6.5)
Neutrophils Relative %: 52 %
Platelets: 390 10*3/uL (ref 150–440)
RBC: 4.66 MIL/uL (ref 3.80–5.20)
RDW: 15.7 % — ABNORMAL HIGH (ref 11.5–14.5)
WBC: 11.6 10*3/uL — ABNORMAL HIGH (ref 3.6–11.0)

## 2015-12-21 LAB — LACTATE DEHYDROGENASE: LDH: 134 U/L (ref 98–192)

## 2015-12-21 LAB — IRON AND TIBC
Iron: 67 ug/dL (ref 28–170)
Saturation Ratios: 16 % (ref 10.4–31.8)
TIBC: 409 ug/dL (ref 250–450)
UIBC: 342 ug/dL

## 2015-12-21 LAB — FERRITIN: Ferritin: 95 ng/mL (ref 11–307)

## 2015-12-21 LAB — SEDIMENTATION RATE: Sed Rate: 29 mm/hr (ref 0–30)

## 2015-12-21 LAB — URIC ACID: Uric Acid, Serum: 4.8 mg/dL (ref 2.3–6.6)

## 2015-12-21 NOTE — Progress Notes (Signed)
Patient here today as new evaluation for elevated platelets.  Referred by Dr. Manuella Ghazi.

## 2015-12-21 NOTE — Progress Notes (Signed)
[place] [facility] day:  12/21/2015  Chief Complaint: Heather Mahoney is a 50 y.o. female with thrombocytosis who is referred by Dr. Keith Rake for assessment and management.  HPI:  The patient states that she just started seeing a primary care physician because of her age, general health issues, blood pressure, and smoking history.    She states that she went to the [facility] on 11/27/2015.  Platelet count was 416,000.   She had follow-up blood work done on 12/12/2015 at [facility].  CBC revealed a hematocrit of 37.8, hemoglobin 12.4, MCV 86, platelets 443,000, WBC 12,000 with an ANC of 6100.  Absolute lymphocyte count was 4700.  CBC on 04/25/2015 revealed a platelet count of 376,000.  She comments that she has bilateral leg pain, from her waist down.  She denies any lower extremity swelling.  She notes no energy.  She notes poor sleep. She has had sweats for the past 2 years (nights are worse).  She describes some drenching sweats with the need to change her clothes. She denies any adenopathy or bleeding.  She states that she wakes up with her hands and arms numb.  She has to "shake them to get them to "wake the up". She denies any numbness or tingling in her toes.  Her last menses was about 5-7 years ago after taking Depo Provera.  She notes cough as if she has something in her throat and can't get it out. She has had a cough for about 6-7 months.  She has been smoking a half a pack a day for "30 some years".   Weight is been stable (135- 145 pounds).  She states that she doesn't eat a lot. She hates vegetables. She eats a lot of chicken. She eats red meat 1 to 2 times a month.  She craves pretzels.  She had a colonoscopy in 2002 or 2003. She denies any nausea, vomiting or diarrhea. She does not think she has ever had an EGD. She denies any melena or hematochezia.  Mammogram in 10/2015 was negative.  Past Medical History  Diagnosis Date   . GERD (gastroesophageal reflux disease)   . Hypertension     On Hydrochlorothiazide    Past Surgical History  Procedure Laterality Date  . Breast surgery  1986    I&D for 'milk duct'  . Salpingoophorectomy Left 2000    Family History  Problem Relation Age of Onset  . Diabetes Mother   . Hypertension Mother   . Hyperlipidemia Mother   . Heart disease Mother     CABG X 4  . Asthma Mother   . Sarcoidosis Mother     In remission  . Diabetes Maternal Grandmother   . Heart disease Maternal Grandmother   . Hyperlipidemia Maternal Grandmother     Social History:  reports that she has been smoking Cigarettes.  She has been smoking about 0.50 packs per day. She has never used smokeless tobacco. She reports that she drinks alcohol. She reports that she uses illicit drugs (Marijuana).  She works at [employer] at [employer].  The patient is alone today.  Allergies:  Allergies  Allergen Reactions  . Strawberry Extract     Current Medications: Current Outpatient Prescriptions  Medication Sig Dispense Refill  . atorvastatin (LIPITOR) 10 MG tablet Take 1 tablet (10 mg total) by mouth daily. 90 tablet 0  . losartan-hydrochlorothiazide (HYZAAR) 50-12.5 MG tablet Take 1 tablet by mouth daily. [place]  tablet 3  . Vitamin D, Ergocalciferol, (DRISDOL) 50000 units CAPS capsule Take 1 capsule (50,000 Units total) by mouth every 7 (seven) days. 12 capsule 0   No current facility-administered medications for this visit.    Review of Systems:  GENERAL:  No energy.  No fevers.  Sweats x 2 years.  Weight stable (135 -145 pounds). PERFORMANCE STATUS (ECOG):  1 HEENT:  No visual changes, runny nose, sore throat, mouth sores or tenderness.  Wisdom teeth need extraction. Lungs: Cough.  No shortness of breath.  No hemoptysis. Cardiac:  No chest pain, palpitations, orthopnea, or PND. GI:  Doesn't eat a lot.  No nausea, vomiting, diarrhea, constipation, melena or hematochezia. GU:  No urgency, frequency,  dysuria, or hematuria. Musculoskeletal:  Bilateral leg pain.  No back pain.  No joint pain.  No muscle tenderness. Extremities:  Right 3rd finger gets stuck.  No pain or swelling. Skin:  No rashes or skin changes. Neuro:  Wakes up with hands and arms numb.  Migraine headaches.  No weakness, balance or coordination issues. Endocrine:  No diabetes, thyroid issues, or hot flashes.  Night sweats.  No menses. Psych:  No mood changes, depression or anxiety. Pain:  No focal pain. Review of systems:  All other systems reviewed and found to be negative.  Physical Exam: Blood pressure 124/85, pulse 79, temperature 98.4 F (36.9 C), temperature source Tympanic, resp. rate 18, weight 143 lb 8.3 oz (65.1 kg). GENERAL:  Well developed, well nourished, woman appears somewhat sleepy and sitting comfortably in the exam room in no acute distress. MENTAL STATUS:  Alert and oriented to person, place and time. HEAD:  Wearing a cap.  Short brown curly hair.  Normocephalic, atraumatic, face symmetric, no Cushingoid features. EYES:  Brown eyes.  Pupils equal round and reactive to light and accomodation.  No conjunctivitis or scleral icterus. ENT:  Oropharynx clear without lesion.  Tongue normal. Mucous membranes moist.  RESPIRATORY:  Clear to auscultation without rales, wheezes or rhonchi. CARDIOVASCULAR:  Regular rate and rhythm without murmur, rub or gallop. ABDOMEN:  Soft, slightly tender left upper quadrant.  No guarding or rebound tenderness. Active bowel sounds and no appreciable hepatosplenomegaly.  No masses.  Pulsatile aorta. SKIN:  No rashes, ulcers or lesions. EXTREMITIES: 1+ clubbing.  No edema, no skin discoloration or tenderness.  No palpable cords. LYMPH NODES: Tender small (< 1 cm) cervical node.  No palpable supraclavicular, axillary or inguinal adenopathy  NEUROLOGICAL: Unremarkable. PSYCH:  Appropriate.  No visits with results within 3 Day(s) from this visit. Latest known visit with results  is:  Office Visit on 12/12/2015  Component Date Value Ref Range Status  . WBC 12/12/2015 12.0* 3.4 - 10.8 x10E3/uL Final  . RBC 12/12/2015 4.38  3.77 - 5.28 x10E6/uL Final  . Hemoglobin 12/12/2015 12.4  11.1 - 15.9 g/dL Final  . Hematocrit 12/12/2015 37.8  34.0 - 46.6 % Final  . MCV 12/12/2015 86  79 - 97 fL Final  . MCH 12/12/2015 28.3  26.6 - 33.0 pg Final  . MCHC 12/12/2015 32.8  31.5 - 35.7 g/dL Final  . RDW 12/12/2015 16.1* 12.3 - 15.4 % Final  . Platelets 12/12/2015 443* 150 - 379 x10E3/uL Final  . Neutrophils 12/12/2015 51   Final  . Lymphs 12/12/2015 39   Final  . Monocytes 12/12/2015 7   Final  . Eos 12/12/2015 3   Final  . Basos 12/12/2015 0   Final  . Neutrophils Absolute 12/12/2015 6.1  1.4 -  7.0 x10E3/uL Final  . Lymphocytes Absolute 12/12/2015 4.7* 0.7 - 3.1 x10E3/uL Final  . Monocytes Absolute 12/12/2015 0.8  0.1 - 0.9 x10E3/uL Final  . EOS (ABSOLUTE) 12/12/2015 0.3  0.0 - 0.4 x10E3/uL Final  . Basophils Absolute 12/12/2015 0.0  0.0 - 0.2 x10E3/uL Final  . Immature Granulocytes 12/12/2015 0   Final  . Immature Grans (Abs) 12/12/2015 0.0  0.0 - 0.1 x10E3/uL Final    Assessment:  Heather Mahoney is a 50 y.o. female with mild thrombocytosis since 11/27/2015.  Platelet count has ranged between 416,000 and 443,000.  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  She has a 15 pack year smoking history.  She has a 2 year history of sweats which appear to be perimenopausal.  Colonoscopy in 2002 or 2003 was negative.  Mammogram in 10/2015 was negative.  She is fatigued.  She denies any fevers or weight loss. She has had a cough for 6-7 months.  Exam reveals a small tender right cervical node.  She has mild lymphocytosis.  Plan: 1.  Review differential diagnosis of thrombocytosis.  Discuss reactive thrombocytopenia and primary thrombocytosis (ET).  Doubt myeloproliferative disorder.  Discuss limited work-up given only recent thrombocytosis. 2.  Labs today;  CBC with diff, CMP,  ESR, ferritin, iron studies, LDH, uric acid, FSH, estradiol. 3.  CXR (PA and lateral):  cough. 4.  Discuss consideration of JAK2 testing in future if platelet count remains elevated without explanation.  Discuss JAK2 + in approximately 50% of essential thrombocytosis (ET) cases.  There would be no indication for treatment if she had ET given her mild thrombocytopenia and age. 5.  RTC in 1 week for review of work-up and discussion regarding direction of therapy.   Lequita Asal, MD  12/21/2015, 3:17 PM

## 2015-12-22 LAB — ESTRADIOL: Estradiol: <5 pg/mL

## 2015-12-22 LAB — FOLLICLE STIMULATING HORMONE: FSH: 80.2 m[IU]/mL

## 2015-12-31 ENCOUNTER — Inpatient Hospital Stay (HOSPITAL_BASED_OUTPATIENT_CLINIC_OR_DEPARTMENT_OTHER): Payer: BLUE CROSS/BLUE SHIELD | Admitting: Hematology and Oncology

## 2015-12-31 ENCOUNTER — Encounter: Payer: Self-pay | Admitting: Hematology and Oncology

## 2015-12-31 VITALS — BP 134/94 | HR 76 | Temp 95.8°F | Resp 18 | Ht 67.0 in | Wt 143.1 lb

## 2015-12-31 DIAGNOSIS — D473 Essential (hemorrhagic) thrombocythemia: Secondary | ICD-10-CM | POA: Diagnosis not present

## 2015-12-31 DIAGNOSIS — R61 Generalized hyperhidrosis: Secondary | ICD-10-CM | POA: Diagnosis not present

## 2015-12-31 DIAGNOSIS — M79604 Pain in right leg: Secondary | ICD-10-CM

## 2015-12-31 DIAGNOSIS — I10 Essential (primary) hypertension: Secondary | ICD-10-CM

## 2015-12-31 DIAGNOSIS — D72820 Lymphocytosis (symptomatic): Secondary | ICD-10-CM

## 2015-12-31 DIAGNOSIS — E8809 Other disorders of plasma-protein metabolism, not elsewhere classified: Secondary | ICD-10-CM

## 2015-12-31 DIAGNOSIS — R05 Cough: Secondary | ICD-10-CM

## 2015-12-31 DIAGNOSIS — M79605 Pain in left leg: Secondary | ICD-10-CM | POA: Diagnosis not present

## 2015-12-31 DIAGNOSIS — R779 Abnormality of plasma protein, unspecified: Secondary | ICD-10-CM

## 2015-12-31 DIAGNOSIS — R7989 Other specified abnormal findings of blood chemistry: Secondary | ICD-10-CM

## 2015-12-31 DIAGNOSIS — Z780 Asymptomatic menopausal state: Secondary | ICD-10-CM

## 2015-12-31 DIAGNOSIS — F17210 Nicotine dependence, cigarettes, uncomplicated: Secondary | ICD-10-CM

## 2015-12-31 DIAGNOSIS — K219 Gastro-esophageal reflux disease without esophagitis: Secondary | ICD-10-CM

## 2015-12-31 NOTE — Progress Notes (Signed)
[facility] day:  12/31/2015  Chief Complaint: Heather Mahoney is a 50 y.o. female with thrombocytosis who is seen for review of work-up and discussion regarding direction of therapy.  HPI:  The patient was last seen in the hematology clinic on 12/21/2015.  At that time, she was seen for initial consultation regarding thrombocytosis.  Platelet count had ranged between 416,000 and 443,000.  She appeared to have reactive thrombocytosis.  She had a 15 pack year smoking history.  She noted a cough for 6-7 months.  She had a 2 year history of sweats which appeared to be perimenopausal.  She had mild lymphocytosis.  The patient underwent a work-up.  CBC revealed a normal platelet count of 390,000.  Differential revealed a mild lymphocytosis (4300).  Additional labs revealed an elevated protein of 8.5 (6.5-8.1).  Ferritin and iron studies revealed no evidence of iron deficiency anemia.  LDH was 134 (normal).  Uric acid was 4.8 (normal).  FSH and estradiol confirmed a post-menopausal status.  ESR was 29 (normal).  Chest x-ray on 12/21/2015 revealed bronchitic changes with minimal linear atelectasis versus scarring at RIGHT middle lobe.  Symptomatically, she feels tired (up at 4:30 AM).  She notes some indigestion.  Past Medical History  Diagnosis Date  . GERD (gastroesophageal reflux disease)   . Hypertension     On Hydrochlorothiazide    Past Surgical History  Procedure Laterality Date  . Breast surgery  1986    I&D for 'milk duct'  . Salpingoophorectomy Left 2000    Family History  Problem Relation Age of Onset  . Diabetes Mother   . Hypertension Mother   . Hyperlipidemia Mother   . Heart disease Mother     CABG X 4  . Asthma Mother   . Sarcoidosis Mother     In remission  . Diabetes Maternal Grandmother   . Heart disease Maternal Grandmother   . Hyperlipidemia Maternal Grandmother     Social History:  reports that she has been smoking  Cigarettes.  She has been smoking about 0.50 packs per day. She has never used smokeless tobacco. She reports that she drinks alcohol. She reports that she uses illicit drugs (Marijuana).  She works at [employer] at [employer].  The patient is alone today.  Allergies:  Allergies  Allergen Reactions  . Strawberry Extract     Current Medications: Current Outpatient Prescriptions  Medication Sig Dispense Refill  . atorvastatin (LIPITOR) 10 MG tablet Take 1 tablet (10 mg total) by mouth daily. 90 tablet 0  . losartan-hydrochlorothiazide (HYZAAR) 50-12.5 MG tablet Take 1 tablet by mouth daily. 90 tablet 3  . Vitamin D, Ergocalciferol, (DRISDOL) 50000 units CAPS capsule Take 1 capsule (50,000 Units total) by mouth every 7 (seven) days. 12 capsule 0   No current facility-administered medications for this visit.    Review of Systems:  GENERAL: Fatigue (up at 4;30 AM).  No fevers.  Sweats x 2 years.  Weight stable (135 -145 pounds). PERFORMANCE STATUS (ECOG):  1 HEENT:  No visual changes, runny nose, sore throat, mouth sores or tenderness.  Wisdom teeth need extraction. Lungs: Cough.  No shortness of breath.  No hemoptysis. Cardiac:  No chest pain, palpitations, orthopnea, or PND. GI:  Indigestion.  No nausea, vomiting, diarrhea, constipation, melena or hematochezia. GU:  No urgency, frequency, dysuria, or hematuria. Musculoskeletal:  Bilateral leg pain.  No back pain.  No joint pain.  No muscle tenderness. Extremities:  Right  3rd finger gets stuck.  No pain or swelling. Skin:  No rashes or skin changes. Neuro:  Wakes up with hands and arms numb.  Migraine headaches.  No weakness, balance or coordination issues. Endocrine:  No diabetes, thyroid issues, or hot flashes.  Night sweats.  No menses. Psych:  No mood changes, depression or anxiety. Pain:  No focal pain. Review of systems:  All other systems reviewed and found to be negative.  Physical Exam: Blood pressure 134/94, pulse 76, temperature  95.8 F (35.4 C), temperature source Tympanic, resp. rate 18, height 5' 7" (1.702 m), weight 143 lb 1.3 oz (64.9 kg). GENERAL:  Well developed, well nourished, woman sitting comfortably in the exam room in no acute distress. MENTAL STATUS:  Alert and oriented to person, place and time. HEAD:  Wearing a dark cap.  Short brown curly hair.  Normocephalic, atraumatic, face symmetric, no Cushingoid features. EYES:  Brown eyes. o conjunctivitis or scleral icterus. NEUROLOGICAL: Unremarkable. PSYCH:  Appropriate.  No visits with results within 3 Day(s) from this visit. Latest known visit with results is:  Appointment on 12/21/2015  Component Date Value Ref Range Status  . WBC 12/21/2015 11.6* 3.6 - 11.0 K/uL Final  . RBC 12/21/2015 4.66  3.80 - 5.20 MIL/uL Final  . Hemoglobin 12/21/2015 13.0  12.0 - 16.0 g/dL Final  . HCT 12/21/2015 38.7  35.0 - 47.0 % Final  . MCV 12/21/2015 83.2  80.0 - 100.0 fL Final  . MCH 12/21/2015 27.9  26.0 - 34.0 pg Final  . MCHC 12/21/2015 33.6  32.0 - 36.0 g/dL Final  . RDW 12/21/2015 15.7* 11.5 - 14.5 % Final  . Platelets 12/21/2015 390  150 - 440 K/uL Final  . Neutrophils Relative % 12/21/2015 52   Final  . Neutro Abs 12/21/2015 6.0  1.4 - 6.5 K/uL Final  . Lymphocytes Relative 12/21/2015 37   Final  . Lymphs Abs 12/21/2015 4.3* 1.0 - 3.6 K/uL Final  . Monocytes Relative 12/21/2015 7   Final  . Monocytes Absolute 12/21/2015 0.8  0.2 - 0.9 K/uL Final  . Eosinophils Relative 12/21/2015 3   Final  . Eosinophils Absolute 12/21/2015 0.4  0 - 0.7 K/uL Final  . Basophils Relative 12/21/2015 1   Final  . Basophils Absolute 12/21/2015 0.1  0 - 0.1 K/uL Final  . Sodium 12/21/2015 138  135 - 145 mmol/L Final  . Potassium 12/21/2015 4.0  3.5 - 5.1 mmol/L Final  . Chloride 12/21/2015 99* 101 - 111 mmol/L Final  . CO2 12/21/2015 31  22 - 32 mmol/L Final  . Glucose, Bld 12/21/2015 94  65 - 99 mg/dL Final  . BUN 12/21/2015 19  6 - 20 mg/dL Final  . Creatinine, Ser  12/21/2015 0.72  0.44 - 1.00 mg/dL Final  . Calcium 12/21/2015 9.7  8.9 - 10.3 mg/dL Final  . Total Protein 12/21/2015 8.5* 6.5 - 8.1 g/dL Final  . Albumin 12/21/2015 4.6  3.5 - 5.0 g/dL Final  . AST 12/21/2015 17  15 - 41 U/L Final  . ALT 12/21/2015 14  14 - 54 U/L Final  . Alkaline Phosphatase 12/21/2015 78  38 - 126 U/L Final  . Total Bilirubin 12/21/2015 0.4  0.3 - 1.2 mg/dL Final  . GFR calc non Af Amer 12/21/2015 >60  >60 mL/min Final  . GFR calc Af Amer 12/21/2015 >60  >60 mL/min Final   Comment: (NOTE) The eGFR has been calculated using the CKD EPI equation. This calculation has not  been validated in all clinical situations. eGFR's persistently <60 mL/min signify possible Chronic Kidney Disease.   . Anion gap 12/21/2015 8  5 - 15 Final  . Sed Rate 12/21/2015 29  0 - 30 mm/hr Final  . Ferritin 12/21/2015 95  11 - 307 ng/mL Final  . Iron 12/21/2015 67  28 - 170 ug/dL Final  . TIBC 12/21/2015 409  250 - 450 ug/dL Final  . Saturation Ratios 12/21/2015 16  10.4 - 31.8 % Final  . UIBC 12/21/2015 342   Final  . LDH 12/21/2015 134  98 - 192 U/L Final  . Uric Acid, Serum 12/21/2015 4.8  2.3 - 6.6 mg/dL Final  . [facility] 12/21/2015 80.2   Final   Comment: (NOTE)                    Adult Female:                      Follicular phase      3.5 -  12.5                      Ovulation phase       4.7 -  21.5                      Luteal phase          1.7 -   7.7                      Postmenopausal       25.8 - 134.8 Performed At: [facility] [place], [place] [id number] Lindon Romp MD EX:[id number]   . Estradiol 12/21/2015 <5.0   Final   Comment: (NOTE)                    Adult Female:                      Follicular phase   49.6 -   166.0                      Ovulation phase    85.8 -   498.0                      Luteal phase       43.8 -   211.0                      Postmenopausal     <6.0 -    54.7                    Pregnancy                      1st  trimester     215.0 - >4300.0                    Girls (1-10 years)    6.0 -    27.0 Roche ECLIA methodology Performed At: [facility] [place], [place] [id number] Lindon Romp MD KZ:[id number]     Assessment:  Heather Mahoney is a 50 y.o. female with transient mild thrombocytosis first noted on 11/27/2015.  Platelet count has ranged between 416,000 and 443,000.  Platelet count was 390,000 (normal) on 12/21/2015.  She has reactive thrombocytosis.    She has a 15 pack year smoking history.  Chest x-ray on 12/21/2015 revealed bronchitic changes with minimal linear atelectasis versus scarring at RIGHT middle lobe.  She has a 2 year history of sweats.  Labs on 12/21/2015 confirmed a post-menopausal status.  LDH and uric acid were normal.  Colonoscopy in 2002 or 2003 was negative.  Mammogram in 10/2015 was negative.  She is fatigued.  She denies any fevers or weight loss. She has had a cough for 6-7 months.  Exam reveals a small tender right cervical node.  She has mild lymphocytosis (ALC 4700 on 12/12/2015 and 4300 on 01/17/2016).  Plan: 1.  Review work-up.  Discuss resolution of thrombocytosis.   Discuss reactive thrombocytosis.  Etiology of what caused her reactive thrombocytosis is unclear.  Discussed her smoking history.  Discussed her CXR.  Discuss plan for follow-up CXR or low dose spiral non-contrasted chest CT.  Discuss likely mild reactive lymphocytosis.  Discuss follow-up CBC to ensure resolution.  Discuss mildly elevated protein with plan to recheck .  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 2.  Recommend follow-up CBC with diff and CMP in 3 months.  Consider SPEP with serum immunoglobulins if protein remains elevated. 3.  Recommend follow-up CXR or low dose spiral non-contrast chest CT given smoking history. 4.  Encourage smoking cessation. 5.  RTC prn.   Lequita Asal, MD   12/31/2015, 8:39 AM

## 2015-12-31 NOTE — Progress Notes (Signed)
No changes since last visit. 

## 2016-01-08 ENCOUNTER — Encounter: Payer: Self-pay | Admitting: Emergency Medicine

## 2016-01-08 ENCOUNTER — Emergency Department
Admission: EM | Admit: 2016-01-08 | Discharge: 2016-01-08 | Disposition: A | Discharge status: Home / Self Care | Payer: BLUE CROSS/BLUE SHIELD | Attending: Emergency Medicine | Admitting: Emergency Medicine

## 2016-01-08 ENCOUNTER — Emergency Department: Payer: BLUE CROSS/BLUE SHIELD

## 2016-01-08 DIAGNOSIS — R200 Anesthesia of skin: Secondary | ICD-10-CM | POA: Diagnosis not present

## 2016-01-08 DIAGNOSIS — G43809 Other migraine, not intractable, without status migrainosus: Secondary | ICD-10-CM | POA: Insufficient documentation

## 2016-01-08 DIAGNOSIS — I160 Hypertensive urgency: Secondary | ICD-10-CM | POA: Insufficient documentation

## 2016-01-08 DIAGNOSIS — F17210 Nicotine dependence, cigarettes, uncomplicated: Secondary | ICD-10-CM | POA: Diagnosis not present

## 2016-01-08 DIAGNOSIS — Z79899 Other long term (current) drug therapy: Secondary | ICD-10-CM | POA: Insufficient documentation

## 2016-01-08 DIAGNOSIS — E785 Hyperlipidemia, unspecified: Secondary | ICD-10-CM | POA: Insufficient documentation

## 2016-01-08 DIAGNOSIS — K219 Gastro-esophageal reflux disease without esophagitis: Secondary | ICD-10-CM | POA: Diagnosis not present

## 2016-01-08 DIAGNOSIS — I639 Cerebral infarction, unspecified: Secondary | ICD-10-CM | POA: Diagnosis present

## 2016-01-08 DIAGNOSIS — G43109 Migraine with aura, not intractable, without status migrainosus: Secondary | ICD-10-CM

## 2016-01-08 LAB — COMPREHENSIVE METABOLIC PANEL
ALBUMIN: 4.3 g/dL (ref 3.5–5.0)
ALT: 13 U/L — AB (ref 14–54)
AST: 20 U/L (ref 15–41)
Alkaline Phosphatase: 85 U/L (ref 38–126)
Anion gap: 7 (ref 5–15)
BILIRUBIN TOTAL: 0.8 mg/dL (ref 0.3–1.2)
BUN: 13 mg/dL (ref 6–20)
CHLORIDE: 103 mmol/L (ref 101–111)
CO2: 26 mmol/L (ref 22–32)
CREATININE: 0.69 mg/dL (ref 0.44–1.00)
Calcium: 9.5 mg/dL (ref 8.9–10.3)
GFR calc Af Amer: >60 mL/min (ref >60)
GLUCOSE: 115 mg/dL — AB (ref 65–99)
POTASSIUM: 3.5 mmol/L (ref 3.5–5.1)
Sodium: 136 mmol/L (ref 135–145)
TOTAL PROTEIN: 8.1 g/dL (ref 6.5–8.1)

## 2016-01-08 LAB — DIFFERENTIAL
BASOS ABS: 0.1 10*3/uL (ref 0–0.1)
Basophils Relative: 1 %
EOS ABS: 0.2 10*3/uL (ref 0–0.7)
Eosinophils Relative: 2 %
LYMPHS ABS: 2.2 10*3/uL (ref 1.0–3.6)
Lymphocytes Relative: 23 %
MONOS PCT: 11 %
Monocytes Absolute: 1 10*3/uL — ABNORMAL HIGH (ref 0.2–0.9)
NEUTROS ABS: 6.2 10*3/uL (ref 1.4–6.5)
NEUTROS PCT: 63 %

## 2016-01-08 LAB — CBC
HEMATOCRIT: 37.3 % (ref 35.0–47.0)
HEMOGLOBIN: 12.9 g/dL (ref 12.0–16.0)
MCH: 28.6 pg (ref 26.0–34.0)
MCHC: 34.6 g/dL (ref 32.0–36.0)
MCV: 82.6 fL (ref 80.0–100.0)
Platelets: 331 10*3/uL (ref 150–440)
RBC: 4.52 MIL/uL (ref 3.80–5.20)
RDW: 15.1 % — ABNORMAL HIGH (ref 11.5–14.5)
WBC: 9.7 10*3/uL (ref 3.6–11.0)

## 2016-01-08 LAB — PROTIME-INR
INR: 1.08
Prothrombin Time: 14.2 seconds (ref 11.4–15.0)

## 2016-01-08 LAB — TROPONIN I

## 2016-01-08 LAB — APTT: APTT: 33 s (ref 24–36)

## 2016-01-08 LAB — GLUCOSE, CAPILLARY: Glucose-Capillary: 110 mg/dL — ABNORMAL HIGH (ref 65–99)

## 2016-01-08 MED ORDER — BUTALBITAL-APAP-CAFFEINE 50-325-40 MG PO TABS: 1.0000 | ORAL_TABLET | Freq: Four times a day (QID) | ORAL | Status: AC | PRN

## 2016-01-08 MED ORDER — PROCHLORPERAZINE EDISYLATE 5 MG/ML IJ SOLN
10.0000 mg | Freq: Once | INTRAMUSCULAR | Status: AC
  Administered 2016-01-08: 10 mg via INTRAVENOUS
  Filled 2016-01-08: qty 2

## 2016-01-08 MED ORDER — SODIUM CHLORIDE 0.9 % IV BOLUS (SEPSIS)
1000.0000 mL | Freq: Once | INTRAVENOUS | Status: AC
  Administered 2016-01-08: 1000 mL via INTRAVENOUS

## 2016-01-08 MED ORDER — ASPIRIN 81 MG PO CHEW
324.0000 mg | CHEWABLE_TABLET | Freq: Once | ORAL | Status: AC
  Administered 2016-01-08: 324 mg via ORAL
  Filled 2016-01-08: qty 4

## 2016-01-08 MED ORDER — KETOROLAC TROMETHAMINE 30 MG/ML IJ SOLN
30.0000 mg | Freq: Once | INTRAMUSCULAR | Status: AC
  Administered 2016-01-08: 30 mg via INTRAVENOUS
  Filled 2016-01-08: qty 1

## 2016-01-08 MED ORDER — DIPHENHYDRAMINE HCL 50 MG/ML IJ SOLN
25.0000 mg | Freq: Once | INTRAMUSCULAR | Status: AC
  Administered 2016-01-08: 25 mg via INTRAVENOUS
  Filled 2016-01-08: qty 1

## 2016-01-08 NOTE — Progress Notes (Signed)
   01/08/16 0900  Clinical Encounter Type  Visited With Patient  Visit Type ED;Initial  Referral From Nurse  Consult/Referral To Chaplain  Spiritual Encounters  Spiritual Needs Emotional  Stress Factors  Patient Stress Factors Health changes  Provided care team support for patient. Chap. Zaven Klemens G. [place]

## 2016-01-08 NOTE — ED Notes (Signed)
Called SOC per Dr. Clearnce Hasten to establish code stroke [phone]

## 2016-01-08 NOTE — ED Notes (Signed)
Pt to ed with c/o headache, nausea, left hand numbness and tingling and dizziness that started about 15 min pta.

## 2016-01-08 NOTE — Discharge Instructions (Signed)

## 2016-01-08 NOTE — ED Notes (Signed)
Pt taken to ct scan.  Code stroke called. Charge rn aware.

## 2016-01-08 NOTE — ED Notes (Signed)
Cancelled SOC per Peyton Najjar, Dr. Doy Mince on unit [phone]

## 2016-01-08 NOTE — ED Notes (Signed)
Paged Dr. Doy Mince per Dr. Clearnce Hasten  [phone]

## 2016-01-08 NOTE — ED Notes (Signed)
Stroke nurse comment:  Pt arrived POV at 0821, LKW 0800 today.  Pt co sudden onset of headache, lt side sensory loss and "cold feeling" on lt side.  Pt recently hospitalized at [facility] for syncope, released from hospital yesterday.  NIHSS 1 for partial sensory loss on lt side.  No treatment at this time, pending further imaging from MRI.  Keeping pt in treatment window until 1230, VS Q15, Modified NIHSS Q30, Anna RN, primary nurse updated on status.

## 2016-01-08 NOTE — ED Notes (Signed)
Dr. Doy Mince, Neurologist in room to assess patient.  Will continue to monitor.

## 2016-01-08 NOTE — ED Notes (Signed)
Paged Dr. Doy Mince per Dr. Clearnce Hasten  [phone]

## 2016-01-08 NOTE — ED Provider Notes (Signed)
[facility] Emergency Department Provider Note  ____________________________________________  Time seen: Seen upon arrival to the emergency department  I have reviewed the triage vital signs and the nursing notes.   HISTORY  Chief Complaint Dizziness; Code Stroke; Nausea; and Weakness    HPI Heather Mahoney is a 50 y.o. female story of hypertension who is presenting to the emergency department today with left-sided headache as well as left-sided facial and upper extremity numbness. She says the numbness is in her left hand and started at about 8 AM. She said that she was also seen at [facility] this past Sunday after being found down in her kitchen by her children. She says she had no preceding symptoms. There was no seizure activity noted. She does not have a history of seizures.A code stroke was called from triage.  Patient describes the headache as needed out of 10, stabbing to the left side of her head. She said it started out weaker and then increased to its current status. She said that she also feels nauseous and this was the first symptom that she encountered.   Past Medical History  Diagnosis Date  . GERD (gastroesophageal reflux disease)   . Hypertension     On Hydrochlorothiazide    Patient Active Problem List   Diagnosis Date Noted  . Elevated blood protein 12/21/2015  . Lymphocytosis 12/21/2015  . Hypertension 12/12/2015  . Hyperlipidemia 12/12/2015  . Vitamin D deficiency 12/12/2015  . Elevated platelet count ([place]) 12/12/2015  . Cardiac murmur 12/12/2015  . Hypertensive urgency 11/29/2015  . Abdominal pulsatile mass 11/29/2015    Past Surgical History  Procedure Laterality Date  . Breast surgery  1986    I&D for 'milk duct'  . Salpingoophorectomy Left 2000    Current Outpatient Rx  Name  Route  Sig  Dispense  Refill  . atorvastatin (LIPITOR) 10 MG tablet   Oral   Take 1 tablet (10 mg total) by mouth daily.   90 tablet   0    . losartan-hydrochlorothiazide (HYZAAR) 50-12.5 MG tablet   Oral   Take 1 tablet by mouth daily.   90 tablet   3   . Vitamin D, Ergocalciferol, (DRISDOL) 50000 units CAPS capsule   Oral   Take 1 capsule (50,000 Units total) by mouth every 7 (seven) days.   12 capsule   0     Allergies Strawberry extract  Family History  Problem Relation Age of Onset  . Diabetes Mother   . Hypertension Mother   . Hyperlipidemia Mother   . Heart disease Mother     CABG X 4  . Asthma Mother   . Sarcoidosis Mother     In remission  . Diabetes Maternal Grandmother   . Heart disease Maternal Grandmother   . Hyperlipidemia Maternal Grandmother     Social History Social History  Substance Use Topics  . Smoking status: Current Every Day Smoker -- 0.50 packs/day    Types: Cigarettes  . Smokeless tobacco: Never Used  . Alcohol Use: Yes    Review of Systems Constitutional: No fever/chills Eyes: No visual changes. ENT: No sore throat. Cardiovascular: Denies chest pain. Respiratory: Denies shortness of breath. Gastrointestinal: No abdominal pain.   no vomiting.  No diarrhea.  No constipation. Genitourinary: Negative for dysuria. Musculoskeletal: Negative for back pain. Skin: Negative for rash. Neurological: Negative for focal weakness or numbness.  10-point ROS otherwise negative.  ____________________________________________   PHYSICAL EXAM:  VITAL SIGNS: ED Triage Vitals  Enc Vitals Group     BP 01/08/16 0825 129/103 mmHg     Pulse Rate 01/08/16 0825 103     Resp 01/08/16 0825 20     Temp 01/08/16 0825 98 F (36.7 C)     Temp Source 01/08/16 0847 Oral     SpO2 01/08/16 0825 97 %     Weight 01/08/16 0825 138 lb (62.596 kg)     Height 01/08/16 0825 5\' 7"  (1.702 m)     Head Cir --      Peak Flow --      Pain Score 01/08/16 0925 8     Pain Loc --      Pain Edu? --      Excl. in [place]? --     Constitutional: Alert and oriented. Well appearing and in no acute  distress. Eyes: Conjunctivae are normal. PERRL. EOMI. Head: Atraumatic. Nose: No congestion/rhinnorhea. Mouth/Throat: Mucous membranes are moist.   Neck: No stridor.   Cardiovascular: Normal rate, regular rhythm. Grossly normal heart sounds.  Good peripheral circulation. Respiratory: Normal respiratory effort.  No retractions. Lungs CTAB. Gastrointestinal: Soft and nontender. No distention. No abdominal bruits. No CVA tenderness. Musculoskeletal: No lower extremity tenderness nor edema.  No joint effusions. Neurologic:  Normal speech and language. Patient says that it feels "different" when palpating the left side of the face as well as left upper extremity. Skin:  Skin is warm, dry and intact. No rash noted. Psychiatric: Mood and affect are normal. Speech and behavior are normal.  NIH Stroke Scale   Person Administering Scale: Doran Stabler  Administer stroke scale items in the order listed. Record performance in each category after each subscale exam. Do not go back and change scores. Follow directions provided for each exam technique. Scores should reflect what the patient does, not what the clinician thinks the patient can do. The clinician should record answers while administering the exam and work quickly. Except where indicated, the patient should not be coached (i.e., repeated requests to patient to make a special effort).   1a  Level of consciousness: 0=alert; keenly responsive  1b. LOC questions:  0=Performs both tasks correctly  1c. LOC commands: 0=Performs both tasks correctly  2.  Best Gaze: 0=normal  3.  Visual: 0=No visual loss  4. Facial Palsy: 0=Normal symmetric movement  5a.  Motor left arm: 0=No drift, limb holds 90 (or 45) degrees for full 10 seconds  5b.  Motor right arm: 0=No drift, limb holds 90 (or 45) degrees for full 10 seconds  6a. motor left leg: 0=No drift, limb holds 90 (or 45) degrees for full 10 seconds  6b  Motor right leg:  0=No drift, limb holds  90 (or 45) degrees for full 10 seconds  7. Limb Ataxia: 0=Absent  8.  Sensory: 1=Mild to moderate sensory loss; patient feels pinprick is less sharp or is dull on the affected side; there is a loss of superficial pain with pinprick but patient is aware She is being touched  9. Best Language:  0=No aphasia, normal  10. Dysarthria: 0=Normal  11. Extinction and Inattention: 0=No abnormality  12. Distal motor function: 0=Normal   Total:   1   ____________________________________________   LABS (all labs ordered are listed, but only abnormal results are displayed)  Labs Reviewed  CBC - Abnormal; Notable for the following:    RDW 15.1 (*)    All other components within normal limits  DIFFERENTIAL - Abnormal; Notable for the following:  Monocytes Absolute 1.0 (*)    All other components within normal limits  COMPREHENSIVE METABOLIC PANEL - Abnormal; Notable for the following:    Glucose, Bld 115 (*)    ALT 13 (*)    All other components within normal limits  GLUCOSE, CAPILLARY - Abnormal; Notable for the following:    Glucose-Capillary 110 (*)    All other components within normal limits  PROTIME-INR  APTT  TROPONIN I  URINALYSIS COMPLETEWITH MICROSCOPIC ([facility] ONLY)  CBG MONITORING, ED   ____________________________________________  EKG  ED ECG REPORT I, Doran Stabler, the attending physician, personally viewed and interpreted this ECG.   Date: 01/08/2016  EKG Time: 849  Rate: 87  Rhythm: normal sinus rhythm, PVC times one  Axis: Normal  Intervals:none  ST&T Change: No ST segment elevation or depression. No abnormal T-wave inversions.  ____________________________________________  RADIOLOGY  No acute finding on the CAT scan of the brain.  : IMPRESSION:  No acute infarct or intracranial hemorrhage.  Several scattered punctate and patchy nonspecific white matter changes have progressed since prior examination and may reflect result of chronic small vessel  disease in this hypertensive patient. Other causes of white matter changes felt to be less likely considerations.  No intracranial mass lesion noted on this unenhanced exam.  Cervical spondylotic changes C4-5 incompletely assessed on present exam.  Minimal mucosal thickening ethmoid sinus air cells and maxillary sinuses.   Electronically Signed By: Genia Del M.D. On: 01/08/2016 10:14  ____________________________________________   PROCEDURES    ____________________________________________   INITIAL IMPRESSION / ASSESSMENT AND PLAN / ED COURSE  Pertinent labs & imaging results that were available during my care of the patient were reviewed by me and considered in my medical decision making (see chart for details).  ----------------------------------------- 9:42 AM on 01/08/2016 -----------------------------------------  Dr. Doy Mince of neurology has seen and evaluated the patient and deemed her not a candidate for TPA. The patient will undergo MRI. If the MRI is negative for any acute process Dr. Doy Mince recommends treating the headache and outpatient follow-up.  ----------------------------------------- 10:24 AM on 01/08/2016 -----------------------------------------  Patient is back from MRI and still says has left-sided headache as well as numbness. I reviewed the MRI results as well as CAT scan results with her and she is aware that there is no evidence of acute stroke. We will proceed with migraine treatment.  ----------------------------------------- 11:43 AM on 01/08/2016 -----------------------------------------  Patient now with complete resolution of symptoms after migraine treatment. MRI without any acute findings. Likely complex migraine. We'll give patient neurology follow-up as well as Fioricet for home use. Explain the results as well as the plan to the patient and she understands and is willing to  comply. ____________________________________________   FINAL CLINICAL IMPRESSION(S) / ED DIAGNOSES  Final diagnoses:  Left sided numbness   Complex migraine   Orbie Pyo, MD 01/08/16 1143

## 2016-01-08 NOTE — Consult Note (Addendum)
Referring Physician: Schaevitz    Chief Complaint: Left sided numbness, headache  HPI: Heather Mahoney is an 50 y.o. female who presents after acute onset left sided numbness and headache.  The patient reports waking up normal today.  Was able to drive, etc.  On returning home had the acute onset of left sided headache.  Describes it as sharp and rates at at 8/10.  No associated nausea or vomiting.  Also reports numbness on the left side of her body including the face, arm and leg.   Patient released from [facility] on yesterday after having a syncopal episode after which she was quadriplegic.  Symptoms resolved quickly and spontaneously.  Work up included a MRI/MRA of the head and MRI of the cervical spine that were unremarkable.   Patient with recent stressors of son that was in prison with psychiatric problems for 7 years being released this past weekend.   Initial NIHSS of 1.    Date last known well: 01/08/2016 Time last known well: Time: 08:00 tPA Given: No: Minimal symptoms  MRankin: 0  Past Medical History  Diagnosis Date  . GERD (gastroesophageal reflux disease)   . Hypertension     On Hydrochlorothiazide    Past Surgical History  Procedure Laterality Date  . Breast surgery  1986    I&D for 'milk duct'  . Salpingoophorectomy Left 2000    Family History  Problem Relation Age of Onset  . Diabetes Mother   . Hypertension Mother   . Hyperlipidemia Mother   . Heart disease Mother     CABG X 4  . Asthma Mother   . Sarcoidosis Mother     In remission  . Diabetes Maternal Grandmother   . Heart disease Maternal Grandmother   . Hyperlipidemia Maternal Grandmother    Social History:  reports that she has been smoking Cigarettes.  She has been smoking about 0.50 packs per day. She has never used smokeless tobacco. She reports that she drinks alcohol. She reports that she uses illicit drugs (Marijuana).  Allergies:  Allergies  Allergen Reactions  . Strawberry Extract      Medications: I have reviewed the patient's current medications. Prior to Admission:  Prior to Admission medications   Medication Sig Start Date End Date Taking? Authorizing Provider  atorvastatin (LIPITOR) 10 MG tablet Take 1 tablet (10 mg total) by mouth daily. 12/12/15   Roselee Nova, MD  losartan-hydrochlorothiazide (HYZAAR) 50-12.5 MG tablet Take 1 tablet by mouth daily. 12/12/15   Roselee Nova, MD  Vitamin D, Ergocalciferol, (DRISDOL) 50000 units CAPS capsule Take 1 capsule (50,000 Units total) by mouth every 7 (seven) days. 12/12/15   Roselee Nova, MD    ROS: History obtained from the patient  General ROS: negative for - chills, fatigue, fever, night sweats, weight gain or weight loss Psychological ROS: negative for - behavioral disorder, hallucinations, memory difficulties, mood swings or suicidal ideation Ophthalmic ROS: negative for - blurry vision, double vision, eye pain or loss of vision ENT ROS: negative for - epistaxis, nasal discharge, oral lesions, sore throat, tinnitus or vertigo Allergy and Immunology ROS: negative for - hives or itchy/watery eyes Hematological and Lymphatic ROS: negative for - bleeding problems, bruising or swollen lymph nodes Endocrine ROS: negative for - galactorrhea, hair pattern changes, polydipsia/polyuria or temperature intolerance Respiratory ROS: negative for - cough, hemoptysis, shortness of breath or wheezing Cardiovascular ROS: negative for - chest pain, dyspnea on exertion, edema or irregular heartbeat Gastrointestinal ROS:  negative for - abdominal pain, diarrhea, hematemesis, nausea/vomiting or stool incontinence Genito-Urinary ROS: negative for - dysuria, hematuria, incontinence or urinary frequency/urgency Musculoskeletal ROS: negative for - joint swelling or muscular weakness Neurological ROS: as noted in HPI Dermatological ROS: negative for rash and skin lesion changes  Physical Examination: Blood pressure 126/98, pulse  92, temperature 98.2 F (36.8 C), temperature source Oral, resp. rate 16, height 5\' 7"  (1.702 m), weight 61.78 kg (136 lb 3.2 oz), SpO2 98 %.  HEENT-  Normocephalic, no lesions, without obvious abnormality.  Normal external eye and conjunctiva.  Normal TM's bilaterally.  Normal auditory canals and external ears. Normal external nose, mucus membranes and septum.  Normal pharynx. Cardiovascular- S1, S2 normal, pulses palpable throughout   Lungs- chest clear, no wheezing, rales, normal symmetric air entry Abdomen- soft, non-tender; bowel sounds normal; no masses,  no organomegaly Extremities- no edema Lymph-no adenopathy palpable Musculoskeletal-no joint tenderness, deformity or swelling Skin-warm and dry, no hyperpigmentation, vitiligo, or suspicious lesions  Neurological Examination Mental Status: Alert, oriented, thought content appropriate.  Speech fluent without evidence of aphasia.  Able to follow 3 step commands without difficulty. Cranial Nerves: II: Discs flat bilaterally; Visual fields grossly normal, pupils equal, round, reactive to light and accommodation III,IV, VI: ptosis not present, extra-ocular motions intact bilaterally V,VII: smile symmetric, facial light touch sensation normal bilaterally VIII: hearing normal bilaterally IX,X: gag reflex present XI: bilateral shoulder shrug XII: midline tongue extension Motor: Right : Upper extremity   5/5    Left:     Upper extremity   5/5  Lower extremity   5/5     Lower extremity   5/5 Tone and bulk:normal tone throughout; no atrophy noted Sensory: Pinprick and light touch decreased on the left splitting the midline Deep Tendon Reflexes: 2+ and symmetric with absent AJ's bilaterally Plantars: Right: downgoing   Left: downgoing Cerebellar: Normal finger-to-nose and normal heel-to-shin testing bilaterally Gait: not tested due to safety concerns   Laboratory Studies:  Basic Metabolic Panel:  Recent Labs Lab 01/08/16 0841  NA  136  K 3.5  CL 103  CO2 26  GLUCOSE 115*  BUN 13  CREATININE 0.69  CALCIUM 9.5    Liver Function Tests:  Recent Labs Lab 01/08/16 0841  AST 20  ALT 13*  ALKPHOS 85  BILITOT 0.8  PROT 8.1  ALBUMIN 4.3   No results for input(s): LIPASE, AMYLASE in the last 168 hours. No results for input(s): AMMONIA in the last 168 hours.  CBC:  Recent Labs Lab 01/08/16 0841  WBC 9.7  NEUTROABS 6.2  HGB 12.9  HCT 37.3  MCV 82.6  PLT 331    Cardiac Enzymes:  Recent Labs Lab 01/08/16 0841  TROPONINI <0.03    BNP: Invalid input(s): POCBNP  CBG:  Recent Labs Lab 01/08/16 0842  GLUCAP 110*    Microbiology: Results for orders placed or performed in visit on 08/10/12  Beta Strep Culture [facility])     Status: None   Collection Time: 08/10/12 10:38 AM  Result Value Ref Range Status   Micro Text Report   Final       SOURCE: THROAT    ORGANISM 1                MODERATE GROWTH GROUP G STREPTOCOCCUS   COMMENT                   -   ANTIBIOTIC  Coagulation Studies:  Recent Labs  01/08/16 0841  LABPROT 14.2  INR 1.08    Urinalysis: No results for input(s): COLORURINE, LABSPEC, PHURINE, GLUCOSEU, HGBUR, BILIRUBINUR, KETONESUR, PROTEINUR, UROBILINOGEN, NITRITE, LEUKOCYTESUR in the last 168 hours.  Invalid input(s): APPERANCEUR  Lipid Panel: No results found for: CHOL, TRIG, HDL, CHOLHDL, VLDL, LDLCALC  HgbA1C: No results found for: HGBA1C  Urine Drug Screen:  No results found for: LABOPIA, COCAINSCRNUR, LABBENZ, AMPHETMU, THCU, LABBARB  Alcohol Level: No results for input(s): ETH in the last 168 hours.  Other results: EKG: normal sinus rhythm at 87 bpm.  Imaging: Ct Head Wo Contrast  01/08/2016  CLINICAL DATA:  Acute onset of left face and left upper extremity with weakness and numbness, generalized headache, code stroke EXAM: CT HEAD WITHOUT CONTRAST TECHNIQUE: Contiguous axial images were obtained from  the base of the skull through the vertex without intravenous contrast. COMPARISON:  Seven/ 6/16 FINDINGS: No skull fracture is noted. Paranasal sinuses and mastoid air cells are unremarkable. No intracranial hemorrhage, mass effect or midline shift. No definite acute cortical infarction. Ventricular size is stable from prior exam. No mass lesion is noted on this unenhanced scan. No hydrocephalus. No intra or extra-axial fluid collection. IMPRESSION: No acute intracranial abnormality. No definite acute cortical infarction. These results were called by telephone at the time of interpretation on 01/08/2016 at 8:46 am to Dr. Larae Grooms , who verbally acknowledged these results. Electronically Signed   By: Lahoma Crocker M.D.   On: 01/08/2016 08:46    Assessment: 50 y.o. female presenting with left sided headache and left sided paresthesias.  Patient splits the midline.  No weakness noted.  Head CT personally reviewed and shows no acute changes.  Although with extensive work up about 24 hours ago with change in neurological presentation would image further.    Stroke Risk Factors - hyperlipidemia, hypertension and smoking  Plan: 1. MRI of the brain without contrast.  If unremarkable, no further work up indicated at this time.   2. ASA 81mg  daily    Alexis Goodell, MD Neurology [phone] 01/08/2016, 9:12 AM

## 2016-01-08 NOTE — ED Notes (Signed)
Care handed off from [place], [place]

## 2016-01-08 NOTE — ED Notes (Signed)
Code  Stroke  Called  To 3333

## 2016-01-08 NOTE — ED Notes (Signed)
Patient presents to the ED with a left sided headache and left sided facial numbness/tingling that began at 8am.  Patient reports recently being seen at [facility].  Patient is alert and oriented at this time.  Patient is complaining of feeling weak.

## 2016-01-09 ENCOUNTER — Encounter: Payer: Self-pay | Admitting: Family Medicine

## 2016-01-09 ENCOUNTER — Ambulatory Visit (INDEPENDENT_AMBULATORY_CARE_PROVIDER_SITE_OTHER): Payer: BLUE CROSS/BLUE SHIELD | Admitting: Family Medicine

## 2016-01-09 VITALS — BP 118/72 | HR 97 | Temp 98.0°F | Resp 16 | Ht 67.0 in | Wt 138.0 lb

## 2016-01-09 DIAGNOSIS — G43009 Migraine without aura, not intractable, without status migrainosus: Secondary | ICD-10-CM | POA: Diagnosis not present

## 2016-01-09 DIAGNOSIS — E785 Hyperlipidemia, unspecified: Secondary | ICD-10-CM | POA: Diagnosis not present

## 2016-01-09 DIAGNOSIS — I10 Essential (primary) hypertension: Secondary | ICD-10-CM

## 2016-01-09 DIAGNOSIS — G43909 Migraine, unspecified, not intractable, without status migrainosus: Secondary | ICD-10-CM | POA: Insufficient documentation

## 2016-01-09 MED ORDER — LOSARTAN POTASSIUM-HCTZ 50-12.5 MG PO TABS: 1.0000 | ORAL_TABLET | Freq: Every day | ORAL | Status: DC

## 2016-01-09 NOTE — Progress Notes (Signed)
Name: Heather Mahoney   MRN: [id number]    DOB: 1966/03/09   Date:01/09/2016       Progress Note  Subjective  Chief Complaint  Chief Complaint  Patient presents with  . Hypertension    follow up  . Hyperlipidemia  . ER Follow up    Seen at [facility] 01/08/16 and [facility] 01/06/16. Family found her blackouted on floor. Neurology referral    HPI  Hypertension: Takes Losartan-HCTZ 50-12.5 mg daily. Works well, BP is at goal today.   Hyperlipidemia: Elevated total cholesterol and LDL. Started on Atorvastatin 10 mg since February 2017. No side effects.   Migraine Headache:Was seen in the ER with intense left sided headache, numbness and tingling  of left hand. Rules out for acute stroke and was felt to be migraines. Neurology was consulted and recommended outpatient follow-up. Was started on Fioricet, headache has now resolved. ER records and imaging studies reviewed and discussed with pt. In detail.  Past Medical History  Diagnosis Date  . GERD (gastroesophageal reflux disease)   . Hypertension     On Hydrochlorothiazide    Past Surgical History  Procedure Laterality Date  . Breast surgery  1986    I&D for 'milk duct'  . Salpingoophorectomy Left 2000    Family History  Problem Relation Age of Onset  . Diabetes Mother   . Hypertension Mother   . Hyperlipidemia Mother   . Heart disease Mother     CABG X 4  . Asthma Mother   . Sarcoidosis Mother     In remission  . Diabetes Maternal Grandmother   . Heart disease Maternal Grandmother   . Hyperlipidemia Maternal Grandmother     Social History   Social History  . Marital Status: Married    Spouse Name: N/A  . Number of Children: N/A  . Years of Education: N/A   Occupational History  . Not on file.   Social History Main Topics  . Smoking status: Current Every Day Smoker -- 0.50 packs/day    Types: Cigarettes  . Smokeless tobacco: Never Used  . Alcohol Use: Yes  . Drug Use: Yes    Special: Marijuana     Comment: smokes  Marijuana 'when I cant sleep, maybe once or twice a week'  . Sexual Activity: Yes   Other Topics Concern  . Not on file   Social History Narrative     Current outpatient prescriptions:  .  atorvastatin (LIPITOR) 10 MG tablet, Take 1 tablet (10 mg total) by mouth daily., Disp: 90 tablet, Rfl: 0 .  butalbital-acetaminophen-caffeine (FIORICET) 50-325-40 MG tablet, Take 1-2 tablets by mouth every 6 (six) hours as needed for headache., Disp: 20 tablet, Rfl: 0 .  losartan-hydrochlorothiazide (HYZAAR) 50-12.5 MG tablet, Take 1 tablet by mouth daily., Disp: 90 tablet, Rfl: 3 .  Vitamin D, Ergocalciferol, (DRISDOL) 50000 units CAPS capsule, Take 1 capsule (50,000 Units total) by mouth every 7 (seven) days., Disp: 12 capsule, Rfl: 0  Allergies  Allergen Reactions  . Strawberry Extract      Review of Systems  Constitutional: Negative for fever, chills, weight loss and malaise/fatigue.  Eyes: Negative for blurred vision and double vision.  Respiratory: Negative for cough and shortness of breath.   Cardiovascular: Negative for chest pain and palpitations.  Gastrointestinal: Negative for abdominal pain.  Neurological: Negative for headaches.     Objective  Filed Vitals:   01/09/16 0923  BP: 118/72  Pulse: 97  Temp: 98 F (36.7 C)  TempSrc:  Oral  Resp: 16  Height: 5\' 7"  (1.702 m)  Weight: 138 lb (62.596 kg)  SpO2: 97%    Physical Exam  Constitutional: She is oriented to person, place, and time and well-developed, well-nourished, and in no distress.  HENT:  Head: Normocephalic and atraumatic.  Cardiovascular: Normal rate, regular rhythm, S1 normal and S2 normal.   Murmur heard.  Systolic murmur is present with a grade of 2/6  Pulmonary/Chest: Effort normal and breath sounds normal.  Abdominal: Soft. Bowel sounds are normal.  Musculoskeletal: She exhibits no edema.  Neurological: She is alert and oriented to person, place, and time.  Psychiatric: Mood, memory, affect and  judgment normal.  Nursing note and vitals reviewed.       Assessment & Plan  1. Essential hypertension Blood pressure improving, continue present therapy - losartan-hydrochlorothiazide (HYZAAR) 50-12.5 MG tablet; Take 1 tablet by mouth daily.  Dispense: 90 tablet; Refill: 0  2. Hyperlipidemia Repeat fasting lipid panel and adjust medication as necessary - Lipid Profile  3. Migraine without aura and without status migrainosus, not intractable Continue on Fioricet started by the ER. Referral to neurology for further assessment of migraine headaches.   Dorothy Polhemus [facility] 01/09/2016 9:31 AM

## 2016-01-10 ENCOUNTER — Telehealth: Payer: Self-pay

## 2016-01-10 NOTE — Telephone Encounter (Signed)
Daughter stated that her mother was seen here yesterday and that she needed a note for work to keep her out due to her not feeling any better.

## 2016-01-10 NOTE — Telephone Encounter (Signed)
Patient mentioned that her headache had gone away and her office visit yesterday. If she needs any further time off, she should contact the neurologist whom she has been referred by the ER

## 2016-01-11 ENCOUNTER — Other Ambulatory Visit: Payer: Self-pay

## 2016-01-11 ENCOUNTER — Emergency Department
Admission: EM | Admit: 2016-01-11 | Discharge: 2016-01-11 | Disposition: A | Discharge status: Home / Self Care | Payer: BLUE CROSS/BLUE SHIELD | Attending: Emergency Medicine | Admitting: Emergency Medicine

## 2016-01-11 ENCOUNTER — Encounter: Payer: Self-pay | Admitting: *Deleted

## 2016-01-11 ENCOUNTER — Emergency Department: Payer: BLUE CROSS/BLUE SHIELD

## 2016-01-11 DIAGNOSIS — J9801 Acute bronchospasm: Secondary | ICD-10-CM

## 2016-01-11 DIAGNOSIS — Z79899 Other long term (current) drug therapy: Secondary | ICD-10-CM | POA: Diagnosis not present

## 2016-01-11 DIAGNOSIS — R0602 Shortness of breath: Secondary | ICD-10-CM | POA: Diagnosis present

## 2016-01-11 DIAGNOSIS — R011 Cardiac murmur, unspecified: Secondary | ICD-10-CM | POA: Insufficient documentation

## 2016-01-11 DIAGNOSIS — F1290 Cannabis use, unspecified, uncomplicated: Secondary | ICD-10-CM | POA: Insufficient documentation

## 2016-01-11 DIAGNOSIS — I160 Hypertensive urgency: Secondary | ICD-10-CM | POA: Diagnosis not present

## 2016-01-11 DIAGNOSIS — I10 Essential (primary) hypertension: Secondary | ICD-10-CM | POA: Insufficient documentation

## 2016-01-11 DIAGNOSIS — Z8719 Personal history of other diseases of the digestive system: Secondary | ICD-10-CM | POA: Insufficient documentation

## 2016-01-11 DIAGNOSIS — E785 Hyperlipidemia, unspecified: Secondary | ICD-10-CM | POA: Insufficient documentation

## 2016-01-11 DIAGNOSIS — F17210 Nicotine dependence, cigarettes, uncomplicated: Secondary | ICD-10-CM | POA: Insufficient documentation

## 2016-01-11 DIAGNOSIS — G43909 Migraine, unspecified, not intractable, without status migrainosus: Secondary | ICD-10-CM | POA: Diagnosis not present

## 2016-01-11 MED ORDER — ALBUTEROL SULFATE HFA 108 (90 BASE) MCG/ACT IN AERS: 2.0000 | INHALATION_SPRAY | Freq: Four times a day (QID) | RESPIRATORY_TRACT | Status: DC | PRN

## 2016-01-11 MED ORDER — IPRATROPIUM-ALBUTEROL 0.5-2.5 (3) MG/3ML IN SOLN
3.0000 mL | Freq: Once | RESPIRATORY_TRACT | Status: AC
  Administered 2016-01-11: 3 mL via RESPIRATORY_TRACT
  Filled 2016-01-11 (×2): qty 3

## 2016-01-11 NOTE — ED Provider Notes (Signed)
[facility] Emergency Department Provider Note  ____________________________________________    I have reviewed the triage vital signs and the nursing notes.   HISTORY  Chief Complaint Shortness of Breath    HPI Heather Mahoney is a 50 y.o. female who presents with shortness of breath. Patient reports she was at work when she started becoming short of breath and anxious. She feels this may be a panic attack. She denies chest pain. She does report a history of smoking. Denies fevers or chills. No recent travel. No calf pain or swelling. No pleurisy.     Past Medical History  Diagnosis Date  . GERD (gastroesophageal reflux disease)   . Hypertension     On Hydrochlorothiazide    Patient Active Problem List   Diagnosis Date Noted  . Migraine headache 01/09/2016  . Lymphocytosis 12/21/2015  . Hypertension 12/12/2015  . Hyperlipidemia 12/12/2015  . Vitamin D deficiency 12/12/2015  . Cardiac murmur 12/12/2015  . Hypertensive urgency 11/29/2015  . Abdominal pulsatile mass 11/29/2015    Past Surgical History  Procedure Laterality Date  . Breast surgery  1986    I&D for 'milk duct'  . Salpingoophorectomy Left 2000    Current Outpatient Rx  Name  Route  Sig  Dispense  Refill  . albuterol (PROVENTIL HFA;VENTOLIN HFA) 108 (90 Base) MCG/ACT inhaler   Inhalation   Inhale 2 puffs into the lungs every 6 (six) hours as needed for wheezing or shortness of breath.   1 Inhaler   2   . atorvastatin (LIPITOR) 10 MG tablet   Oral   Take 1 tablet (10 mg total) by mouth daily.   90 tablet   0   . butalbital-acetaminophen-caffeine (FIORICET) 50-325-40 MG tablet   Oral   Take 1-2 tablets by mouth every 6 (six) hours as needed for headache.   20 tablet   0   . losartan-hydrochlorothiazide (HYZAAR) 50-12.5 MG tablet   Oral   Take 1 tablet by mouth daily.   90 tablet   0   . Vitamin D, Ergocalciferol, (DRISDOL) 50000 units CAPS capsule   Oral   Take 1  capsule (50,000 Units total) by mouth every 7 (seven) days.   12 capsule   0     Allergies Strawberry extract  Family History  Problem Relation Age of Onset  . Diabetes Mother   . Hypertension Mother   . Hyperlipidemia Mother   . Heart disease Mother     CABG X 4  . Asthma Mother   . Sarcoidosis Mother     In remission  . Diabetes Maternal Grandmother   . Heart disease Maternal Grandmother   . Hyperlipidemia Maternal Grandmother     Social History Social History  Substance Use Topics  . Smoking status: Current Every Day Smoker -- 0.50 packs/day    Types: Cigarettes  . Smokeless tobacco: Never Used  . Alcohol Use: Yes    Review of Systems  Constitutional: Negative for fever. Eyes: Negative for redness ENT: Negative for sore throat Cardiovascular: Negative for chest pain Respiratory: As above Gastrointestinal: Negative for abdominal pain Genitourinary: Negative for dysuria. Musculoskeletal: Negative for back pain. Skin: Negative for rash. Neurological: Negative for focal weakness Psychiatric: Positive for anxiety    ____________________________________________   PHYSICAL EXAM:  VITAL SIGNS: ED Triage Vitals  Enc Vitals Group     BP 01/11/16 1825 111/84 mmHg     Pulse Rate 01/11/16 1825 84     Resp 01/11/16 1825 19  Temp 01/11/16 1825 97.9 F (36.6 C)     Temp Source 01/11/16 1825 Oral     SpO2 01/11/16 1825 99 %     Weight 01/11/16 1825 138 lb (62.596 kg)     Height 01/11/16 1825 5\' 7"  (1.702 m)     Head Cir --      Peak Flow --      Pain Score 01/11/16 1821 0     Pain Loc --      Pain Edu? --      Excl. in [place]? --      Constitutional: Alert and oriented. Well appearing but anxious and tearful Eyes: Conjunctivae are normal. No erythema or injection ENT   Head: Normocephalic and atraumatic.   Mouth/Throat: Mucous membranes are moist. Cardiovascular: Normal rate, regular rhythm. Normal and symmetric distal pulses are present in the  upper extremities. No murmurs or rubs  Respiratory: Normal respiratory effort without tachypnea nor retractions. Scattered wheezes Gastrointestinal: Soft and non-tender in all quadrants. No distention. There is no CVA tenderness. Genitourinary: deferred Musculoskeletal: Nontender with normal range of motion in all extremities. No lower extremity tenderness nor edema. Neurologic:  Normal speech and language. No gross focal neurologic deficits are appreciated. Skin:  Skin is warm, dry and intact. No rash noted. Psychiatric: Mood and affect are normal. Patient exhibits appropriate insight and judgment.  ____________________________________________    LABS (pertinent positives/negatives)  Labs Reviewed - No data to display  ____________________________________________   EKG  ED ECG REPORT I, Lavonia Drafts, the attending physician, personally viewed and interpreted this ECG.  Date: 01/11/2016 EKG Time: 6:20 PM Rate: 93 Rhythm: normal sinus rhythm QRS Axis: normal Intervals: normal ST/T Wave abnormalities: normal Conduction Disturbances: none Narrative Interpretation: PVCs   ____________________________________________    RADIOLOGY  Chest x-ray normal  ____________________________________________   PROCEDURES  Procedure(s) performed: none  Critical Care performed:none  ____________________________________________   INITIAL IMPRESSION / ASSESSMENT AND PLAN / ED COURSE  Pertinent labs & imaging results that were available during my care of the patient were reviewed by me and considered in my medical decision making (see chart for details).  Patient presents with shortness of breath of relatively rapid onset. She does have scattered wheezes but certainly there is a component of anxiety as well. We will give a DuoNeb, check a chest x-ray and reevaluate. She has no chest pain.  After DuoNeb patient reports complete resolution of her shortness of breath. She is  smiling and no longer tearful. She feels well. At this point I'll discharge her with a inhaler for asthma needed at home. We did discuss return precautions  ____________________________________________   FINAL CLINICAL IMPRESSION(S) / ED DIAGNOSES  Final diagnoses:  Bronchospasm          Lavonia Drafts, MD 01/11/16 2004

## 2016-01-11 NOTE — Discharge Instructions (Signed)
Bronchospasm, Adult  A bronchospasm is a spasm or tightening of the airways going into the lungs. During a bronchospasm breathing becomes more difficult because the airways get smaller. When this happens there can be coughing, a whistling sound when breathing (wheezing), and difficulty breathing. Bronchospasm is often associated with asthma, but not all patients who experience a bronchospasm have asthma.  CAUSES   A bronchospasm is caused by inflammation or irritation of the airways. The inflammation or irritation may be triggered by:   · Allergies (such as to animals, pollen, food, or mold). Allergens that cause bronchospasm may cause wheezing immediately after exposure or many hours later.    · Infection. Viral infections are believed to be the most common cause of bronchospasm.    · Exercise.    · Irritants (such as pollution, cigarette smoke, strong odors, aerosol sprays, and paint fumes).    · Weather changes. Winds increase molds and pollens in the air. Rain refreshes the air by washing irritants out. Cold air may cause inflammation.    · Stress and emotional upset.    SIGNS AND SYMPTOMS   · Wheezing.    · Excessive nighttime coughing.    · Frequent or severe coughing with a simple cold.    · Chest tightness.    · Shortness of breath.    DIAGNOSIS   Bronchospasm is usually diagnosed through a history and physical exam. Tests, such as chest X-rays, are sometimes done to look for other conditions.  TREATMENT   · Inhaled medicines can be given to open up your airways and help you breathe. The medicines can be given using either an inhaler or a nebulizer machine.  · Corticosteroid medicines may be given for severe bronchospasm, usually when it is associated with asthma.  HOME CARE INSTRUCTIONS   · Always have a plan prepared for seeking medical care. Know when to call your health care provider and local emergency services (911 in the [place] Know where you can access local emergency care.  · Only take medicines as  directed by your health care provider.  · If you were prescribed an inhaler or nebulizer machine, ask your health care provider to explain how to use it correctly. Always use a spacer with your inhaler if you were given one.  · It is necessary to remain calm during an attack. Try to relax and breathe more slowly.   · Control your home environment in the following ways:      Change your heating and air conditioning filter at least once a month.      Limit your use of fireplaces and wood stoves.    Do not smoke and do not allow smoking in your home.      Avoid exposure to perfumes and fragrances.      Get rid of pests (such as roaches and mice) and their droppings.      Throw away plants if you see mold on them.      Keep your house clean and dust free.      Replace carpet with wood, tile, or vinyl flooring. Carpet can trap dander and dust.      Use allergy-proof pillows, mattress covers, and box spring covers.      Wash bed sheets and blankets every week in hot water and dry them in a dryer.      Use blankets that are made of polyester or cotton.      Wash hands frequently.  SEEK MEDICAL CARE IF:   · You have muscle aches.    · You have chest pain.    · The sputum changes from clear or   white to yellow, green, gray, or bloody.    · The sputum you cough up gets thicker.    · There are problems that may be related to the medicine you are given, such as a rash, itching, swelling, or trouble breathing.    SEEK IMMEDIATE MEDICAL CARE IF:   · You have worsening wheezing and coughing even after taking your prescribed medicines.    · You have increased difficulty breathing.    · You develop severe chest pain.  MAKE SURE YOU:   · Understand these instructions.  · Will watch your condition.  · Will get help right away if you are not doing well or get worse.     This information is not intended to replace advice given to you by your health care provider. Make sure you discuss any questions you have with your health care  provider.     Document Released: 10/09/2003 Document Revised: 10/27/2014 Document Reviewed: 03/28/2013  Elsevier Interactive Patient Education ©2016 Elsevier Inc.

## 2016-01-11 NOTE — ED Notes (Addendum)
Pt presents to ED with SOB. Pt states was just seen here two days for same. Pt states was at work this afternoon when became SOB, lightheaded, and dizzy. Upon arrival pt AAOx4, NAD noted at this time. Pt repeating to self in triage, "this is just a panic attack, this is just a panic attack" Talking complete and full sentences.

## 2016-01-12 LAB — LIPID PANEL
Chol/HDL Ratio: 4.4 ratio units (ref 0.0–4.4)
Cholesterol, Total: 226 mg/dL — ABNORMAL HIGH (ref 100–199)
HDL: 51 mg/dL (ref >39)
LDL Calculated: 158 mg/dL — ABNORMAL HIGH (ref 0–99)
Triglycerides: 85 mg/dL (ref 0–149)
VLDL CHOLESTEROL CAL: 17 mg/dL (ref 5–40)

## 2016-01-16 ENCOUNTER — Telehealth: Payer: Self-pay

## 2016-01-16 MED ORDER — ATORVASTATIN CALCIUM 20 MG PO TABS: 20.0000 mg | ORAL_TABLET | Freq: Every day | ORAL | Status: DC

## 2016-01-16 NOTE — Telephone Encounter (Signed)
Lab results have been reported to patient and a prescription for Atorvastatin 20 mg has been sent to [facility] per Dr. Manuella Ghazi, patient has been notified.

## 2016-01-17 ENCOUNTER — Ambulatory Visit (INDEPENDENT_AMBULATORY_CARE_PROVIDER_SITE_OTHER): Payer: BLUE CROSS/BLUE SHIELD | Admitting: Cardiology

## 2016-01-17 ENCOUNTER — Encounter: Payer: Self-pay | Admitting: Cardiology

## 2016-01-17 VITALS — BP 110/77 | HR 93 | Ht 67.0 in | Wt 136.5 lb

## 2016-01-17 DIAGNOSIS — R55 Syncope and collapse: Secondary | ICD-10-CM | POA: Diagnosis not present

## 2016-01-17 DIAGNOSIS — I10 Essential (primary) hypertension: Secondary | ICD-10-CM | POA: Diagnosis not present

## 2016-01-17 DIAGNOSIS — R011 Cardiac murmur, unspecified: Secondary | ICD-10-CM | POA: Diagnosis not present

## 2016-01-17 DIAGNOSIS — E785 Hyperlipidemia, unspecified: Secondary | ICD-10-CM

## 2016-01-17 DIAGNOSIS — I493 Ventricular premature depolarization: Secondary | ICD-10-CM

## 2016-01-17 MED ORDER — LOSARTAN POTASSIUM 50 MG PO TABS: 50.0000 mg | ORAL_TABLET | Freq: Every day | ORAL | Status: DC

## 2016-01-17 NOTE — Progress Notes (Signed)
Cardiology Office Note   Date:  01/17/2016   ID:  Heather Mahoney, Heather Mahoney 02-Apr-1966, MRN [id number]  Referring Doctor:  Keith Rake, MD   Cardiologist:   Wende Bushy, MD   Reason for consultation:  Chief Complaint  Patient presents with  . other    Heart murmur and syncope. Meds reviewed verbally with pt.      History of Present Illness: Heather Mahoney is a 50 y.o. female who presents for Heart murmur and syncope.  Patient had an episode of question of loss of consciousness 01/06/2016. Patient reports that she was fine the whole day, it was a significant day since it was a date that his son got out of prison. They celebrated by eating out and spending a lot of time together. In the evening, she remembers walking from her bedroom to the kitchen to take the chicken biscuits out of the oven. That was the last thing that she remembers before being found on the kitchen floor facedown by her family members. Family heard a loud sound in the kitchen and they rushed to see her face down on the floor. From there recollection as they told her what happened, it appeared that she was able to take out the food out of the oven put it on top of the stove, and was able to throw out her cigarette butt to the trash.  As soon as they rolled her over to her back, they noted her to have her eyes open and staring to the left. She remembers being able to hear her family but the voices sounded like they came from a distance. She remembers her family asking her to move her hands and fingers. In her mind she was moving her fingers but apparently she wasn't able to do so. They called 911 and brought her [facility] for [facility]. Patient remembers EMS report and that her blood pressure was very low, she does not expect to exactly remember the numbers but it sounded like it was 60/40. She denied having any symptoms of chest pain, shortness of breath, palpitations or any sort of prodrome. She was evaluated at [facility] and  eventually she regained mobility of all extremities. She remembers being told that there was no stroke on the MRI.  She was sent here to cardiology for further evaluation of that episode as well as the murmur that was heard on auscultation.  In terms of her hypertension, patient says her blood pressure was out of control until her PCP started her on Hyzaar. She was only previously on HCTZ 12.5. When she started taking Hyzaar after her 12/13/2015 visit, she noted improvement in her blood pressure with the numbers in the 120s. Per medical records, on 12/13/2015, office visit blood pressure was 144/99. On 12/21/2015 hematology visit her blood pressure was 124/88.  Patient also brings up complaints of shortness of breath. This is been going on for many years now. She has shortness breath with exertion. Mild in intensity, occurring every time she walks or takes a flight of stairs. Last during the duration of the activity, resolves with rest.  Otherwise, no headache, fever, cough, colds, abdominal pain, orthopnea, PND, edema.  ROS:  Please see the history of present illness. Aside from mentioned under HPI, all other systems are reviewed and negative.     Past Medical History  Diagnosis Date  . GERD (gastroesophageal reflux disease)   . Hypertension     On Hydrochlorothiazide  . Heart murmur   .  Hyperlipidemia   . Hiatal hernia     Past Surgical History  Procedure Laterality Date  . Breast surgery  1986    I&D for 'milk duct'  . Salpingoophorectomy Left 2000     reports that she has been smoking Cigarettes.  She has a 9.5 pack-year smoking history. She has never used smokeless tobacco. She reports that she drinks alcohol. She reports that she uses illicit drugs (Marijuana).   family history includes Asthma in her mother; Diabetes in her maternal grandmother and mother; Heart attack in her mother; Heart disease in her maternal grandmother and mother; Hyperlipidemia in her maternal  grandmother and mother; Hypertension in her mother; Sarcoidosis in her mother.   Current Outpatient Prescriptions  Medication Sig Dispense Refill  . albuterol (PROVENTIL HFA;VENTOLIN HFA) 108 (90 Base) MCG/ACT inhaler Inhale 2 puffs into the lungs every 6 (six) hours as needed for wheezing or shortness of breath. 1 Inhaler 2  . atorvastatin (LIPITOR) 20 MG tablet Take 1 tablet (20 mg total) by mouth daily. 90 tablet 0  . butalbital-acetaminophen-caffeine (FIORICET) 50-325-40 MG tablet Take 1-2 tablets by mouth every 6 (six) hours as needed for headache. 20 tablet 0  . Vitamin D, Ergocalciferol, (DRISDOL) 50000 units CAPS capsule Take 1 capsule (50,000 Units total) by mouth every 7 (seven) days. 12 capsule 0  . losartan (COZAAR) 50 MG tablet Take 1 tablet (50 mg total) by mouth daily. 30 tablet 11   No current facility-administered medications for this visit.    Allergies: Strawberry extract    PHYSICAL EXAM: VS:  BP 110/77 mmHg  Pulse 93  Ht 5\' 7"  (1.702 m)  Wt 136 lb 8 oz (61.916 kg)  BMI 21.37 kg/m2 , Body mass index is 21.37 kg/(m^2). Wt Readings from Last 3 Encounters:  01/17/16 136 lb 8 oz (61.916 kg)  01/11/16 138 lb (62.596 kg)  01/09/16 138 lb (62.596 kg)   No significant change in her orthostatic vital signs. Recorded heart rate with change in position likely not accurate as patient has ectopy. GENERAL:  well developed, well nourished, obese, not in acute distress HEENT: normocephalic, pink conjunctivae, anicteric sclerae, no xanthelasma, normal dentition, oropharynx clear NECK:  no neck vein engorgement, JVP normal, no hepatojugular reflux, carotid upstroke brisk and symmetric, no bruit, no thyromegaly, no lymphadenopathy LUNGS:  good respiratory effort, clear to auscultation bilaterally CV:  PMI not displaced, no thrills, no lifts, S1 and S2 within normal limits, no palpable S3 or S4, no rubs, no gallops, soft 2 to 3/6 systolic ejection murmur, nonradiating ABD:  Soft,  nontender, nondistended, normoactive bowel sounds, no abdominal aortic bruit, no hepatomegaly, no splenomegaly MS: nontender back, no kyphosis, no scoliosis, no joint deformities EXT:  2+ DP/PT pulses, no edema, no varicosities, no cyanosis, no clubbing SKIN: warm, nondiaphoretic, normal turgor, no ulcers NEUROPSYCH: alert, oriented to person, place, and time, sensory/motor grossly intact, normal mood, appropriate affect  Recent Labs: 01/08/2016: ALT 13*; BUN 13; Creatinine, Ser 0.69; Hemoglobin 12.9; Platelets 331; Potassium 3.5; Sodium 136   Lipid Panel    Component Value Date/Time   CHOL 226* 01/11/2016 1000   TRIG 85 01/11/2016 1000   HDL 51 01/11/2016 1000   CHOLHDL 4.4 01/11/2016 1000   LDLCALC 158* 01/11/2016 1000     Other studies Reviewed:  EKG:  EKG Is ordered today. The ekg from 01/17/2016  was personally reviewed by me and it revealed sinus rhythm with occasional PVCs. Nonspecific T-wave abnormality. Ventricular rate 92 BPM.  Additional studies/ records  that were reviewed personally reviewed by me today include: None available   ASSESSMENT AND PLAN:   Fall and question of loss of consciousness Based on her story in recent changes in her medications, it may have been related to orthostatic drop in her blood pressure. Patient reported that EMS said her blood pressure was very low. Could not find that information in care everywhere. She hasn't had any recurrence of her symptoms. Recommend to adjust medication to change to just losartan 50 mg daily. DC HCTZ portion. Blood pressure monitor and follow-up in our office for this. Recommend stress test, echo, and Holter monitor.  Systolic murmur Recommend echocardiogram for further evaluation.  Shortness of breath Recommend further investigation with nuclear exercise stress testing. This was also serve as a workup for her question of loss of consciousness.  PVCs noted on EKG Recommend 24-hour Holter  monitor  Hyperlipidemia PCP following. Patient on statin therapy.  Hypertension Medication adjustment as noted above  Current medicines are reviewed at length with the patient today.  The patient does not have concerns regarding medicines.  Labs/ tests ordered today include:  Orders Placed This Encounter  Procedures  . NM Myocar Multi W/Spect W/Wall Motion / EF  . Holter monitor - 24 hour  . EKG 12-Lead  . Echocardiogram    I had a lengthy and detailed discussion with the patient regarding diagnoses, prognosis, diagnostic options, treatment options , and side effects of medications.   I counseled the patient on importance of lifestyle modification including heart healthy diet, regular physical activity.  Disposition:   FU with undersigned after tests   Signed, Wende Bushy, MD  01/17/2016 1:36 PM    [place]

## 2016-01-17 NOTE — Patient Instructions (Signed)
Medication Instructions:  Your physician has recommended you make the following change in your medication:  1. STOP losartan-hydrochlorothiazide (Hyzaar) 50-12.5 2. START Losartan 50 mg once daily  Labwork: None ordered  Testing/Procedures: Your physician has requested that you have an echocardiogram. Echocardiography is a painless test that uses sound waves to create images of your heart. It provides your doctor with information about the size and shape of your heart and how well your heart's chambers and valves are working. This procedure takes approximately one hour. There are no restrictions for this procedure.  Date & Time:________________________________________________________________  Your physician has recommended that you wear a holter monitor. Holter monitors are medical devices that record the heart's electrical activity. Doctors most often use these monitors to diagnose arrhythmias. Arrhythmias are problems with the speed or rhythm of the heartbeat. The monitor is a small, portable device. You can wear one while you do your normal daily activities. This is usually used to diagnose what is causing palpitations/syncope (passing out).  Date & Time: __________________________________________________________________  Your physician has requested that you have en exercise stress myoview. For further information please visit [email or web address]. Please follow instruction sheet, as given.  Date of Procedure:___Wednesday Heather Mahoney 5, 2017 at 08:30AM__________________  Arrival Time for Procedure:___Arrive at 08:15 AM____________  Follow-Up: Your physician recommends that you schedule a follow-up appointment after testing to review results.  Date & Time: _____________________________________________________________   Any Other Special Instructions Will Be Listed Below (If Applicable).  [place]  Your caregiver has ordered a Stress Test with nuclear imaging. The purpose of this test  is to evaluate the blood supply to your heart muscle. This procedure is referred to as a "Non-Invasive Stress Test." This is because other than having an IV started in your vein, nothing is inserted or "invades" your body. Cardiac stress tests are done to find areas of poor blood flow to the heart by determining the extent of coronary artery disease (CAD). Some patients exercise on a treadmill, which naturally increases the blood flow to your heart, while others who are  unable to walk on a treadmill due to physical limitations have a pharmacologic/chemical stress agent called Lexiscan . This medicine will mimic walking on a treadmill by temporarily increasing your coronary blood flow.   Please note: these test may take anywhere between 2-4 hours to complete  PLEASE REPORT TO [place] AT THE FIRST DESK WILL DIRECT YOU WHERE TO GO  Date of Procedure:___Wednesday Heather Mahoney 5, 2017 at 08:30AM__________________  Arrival Time for Procedure:___Arrive at 08:15 AM____________   PLEASE NOTIFY THE OFFICE AT LEAST 24 HOURS IN ADVANCE IF YOU ARE UNABLE TO [place].  [phone] AND  PLEASE NOTIFY NUCLEAR MEDICINE AT [facility] AT LEAST 24 HOURS IN ADVANCE IF YOU ARE UNABLE TO KEEP YOUR APPOINTMENT. [phone]  How to prepare for your Myoview test:  1. Do not eat or drink after midnight 2. No caffeine for 24 hours prior to test 3. No smoking 24 hours prior to test. 4. Your medication may be taken with water.  If your doctor stopped a medication because of this test, do not take that medication. 5. Ladies, please do not wear dresses.  Skirts or pants are appropriate. Please wear a short sleeve shirt. 6. No perfume, cologne or lotion. 7. Wear comfortable walking shoes. No heels!            If you need a refill on your cardiac medications before your next appointment, please call your  pharmacy.  Echocardiogram An echocardiogram, or echocardiography, uses  sound waves (ultrasound) to produce an image of your heart. The echocardiogram is simple, painless, obtained within a short period of time, and offers valuable information to your health care provider. The images from an echocardiogram can provide information such as:  Evidence of coronary artery disease (CAD).  Heart size.  Heart muscle function.  Heart valve function.  Aneurysm detection.  Evidence of a past heart attack.  Fluid buildup around the heart.  Heart muscle thickening.  Assess heart valve function. LET [facility] CARE PROVIDER KNOW ABOUT:  Any allergies you have.  All medicines you are taking, including vitamins, herbs, eye drops, creams, and over-the-counter medicines.  Previous problems you or members of your family have had with the use of anesthetics.  Any blood disorders you have.  Previous surgeries you have had.  Medical conditions you have.  Possibility of pregnancy, if this applies. BEFORE THE PROCEDURE  No special preparation is needed. Eat and drink normally.  PROCEDURE  8. In order to produce an image of your heart, gel will be applied to your chest and a wand-like tool (transducer) will be moved over your chest. The gel will help transmit the sound waves from the transducer. The sound waves will harmlessly bounce off your heart to allow the heart images to be captured in real-time motion. These images will then be recorded. 9. You may need an IV to receive a medicine that improves the quality of the pictures. AFTER THE PROCEDURE You may return to your normal schedule including diet, activities, and medicines, unless your health care provider tells you otherwise.   This information is not intended to replace advice given to you by your health care provider. Make sure you discuss any questions you have with your health care provider.   Document Released: 10/03/2000 Document Revised: 10/27/2014 Document Reviewed: 06/13/2013 Elsevier Interactive  Patient Education 2016 Elsevier Inc.   Holter Monitoring A Holter monitor is a small device that is used to detect abnormal heart rhythms. It clips to your clothing and is connected by wires to flat, sticky disks (electrodes) that attach to your chest. It is worn continuously for 24-48 hours. HOME CARE INSTRUCTIONS  Wear your Holter monitor at all times, even while exercising and sleeping, for as long as directed by your health care provider.  Make sure that the Holter monitor is safely clipped to your clothing or close to your body as recommended by your health care provider.  Do not get the monitor or wires wet.  Do not put body lotion or moisturizer on your chest.  Keep your skin clean.  Keep a diary of your daily activities, such as walking and doing chores. If you feel that your heartbeat is abnormal or that your heart is fluttering or skipping a beat:  Record what you are doing when it happens.  Record what time of day the symptoms occur.  Return your Holter monitor as directed by your health care provider.  Keep all follow-up visits as directed by your health care provider. This is important. SEEK IMMEDIATE MEDICAL CARE IF:  You feel lightheaded or you faint.  You have trouble breathing.  You feel pain in your chest, upper arm, or jaw.  You feel sick to your stomach and your skin is pale, cool, or damp.  You heartbeat feels unusual or abnormal.   This information is not intended to replace advice given to you by your health care provider. Make sure  you discuss any questions you have with your health care provider.   Document Released: 07/04/2004 Document Revised: 10/27/2014 Document Reviewed: 05/15/2014 Elsevier Interactive Patient Education 2016 [place].  Cardiac Nuclear Scanning A cardiac nuclear scan is used to check your heart for problems, such as the following:  A portion of the heart is not getting enough blood.  Part of the heart muscle has died,  which happens with a heart attack.  The heart wall is not working normally.  In this test, a radioactive dye (tracer) is injected into your bloodstream. After the tracer has traveled to your heart, a scanning device is used to measure how much of the tracer is absorbed by or distributed to various areas of your heart. LET [facility] CARE PROVIDER KNOW ABOUT:  Any allergies you have.  All medicines you are taking, including vitamins, herbs, eye drops, creams, and over-the-counter medicines.  Previous problems you or members of your family have had with the use of anesthetics.  Any blood disorders you have.  Previous surgeries you have had.  Medical conditions you have.  RISKS AND COMPLICATIONS Generally, this is a safe procedure. However, as with any procedure, problems can occur. Possible problems include:  10. Serious chest pain. 11. Rapid heartbeat. 12. Sensation of warmth in your chest. This usually passes quickly. BEFORE THE PROCEDURE Ask your health care provider about changing or stopping your regular medicines. PROCEDURE This procedure is usually done at a hospital and takes 2-4 hours.  An IV tube is inserted into one of your veins.  Your health care provider will inject a small amount of radioactive tracer through the tube.  You will then wait for 20-40 minutes while the tracer travels through your bloodstream.  You will lie down on an exam table so images of your heart can be taken. Images will be taken for about 15-20 minutes.  You will exercise on a treadmill or stationary bike. While you exercise, your heart activity will be monitored with an electrocardiogram (ECG), and your blood pressure will be checked.  If you are unable to exercise, you may be given a medicine to make your heart beat faster.  When blood flow to your heart has peaked, tracer will again be injected through the IV tube.  After 20-40 minutes, you will get back on the exam table and have more  images taken of your heart.  When the procedure is over, your IV tube will be removed. AFTER THE PROCEDURE  You will likely be able to leave shortly after the test. Unless your health care provider tells you otherwise, you may return to your normal schedule, including diet, activities, and medicines.  Make sure you find out how and when you will get your test results.   This information is not intended to replace advice given to you by your health care provider. Make sure you discuss any questions you have with your health care provider.   Document Released: 10/31/2004 Document Revised: 10/11/2013 Document Reviewed: 09/14/2013 Elsevier Interactive Patient Education [employer].

## 2016-01-22 ENCOUNTER — Telehealth: Payer: Self-pay | Admitting: Cardiology

## 2016-01-22 NOTE — Telephone Encounter (Signed)
Reviewed instructions with patient for her stress test tomorrow and she had no further questions at this time.

## 2016-01-23 ENCOUNTER — Ambulatory Visit
Admission: RE | Admit: 2016-01-23 | Discharge: 2016-01-23 | Disposition: A | Discharge status: Home / Self Care | Payer: BLUE CROSS/BLUE SHIELD | Source: Ambulatory Visit | Attending: Cardiology | Admitting: Cardiology

## 2016-01-23 DIAGNOSIS — R011 Cardiac murmur, unspecified: Secondary | ICD-10-CM | POA: Insufficient documentation

## 2016-01-23 DIAGNOSIS — R55 Syncope and collapse: Secondary | ICD-10-CM | POA: Insufficient documentation

## 2016-01-23 LAB — NM MYOCAR MULTI W/SPECT W/WALL MOTION / EF
CHL CUP NUCLEAR SRS: 4
CHL CUP RESTING HR STRESS: 72 {beats}/min
CSEPEDS: 30 s
CSEPEW: 10.1 METS
CSEPHR: 84 %
Exercise duration (min): 8 min
LV sys vol: 49 mL
LVDIAVOL: 106 mL (ref 46–106)
NUC STRESS TID: 0.92
Peak HR: 142 {beats}/min
SDS: 0
SSS: 0

## 2016-01-23 MED ORDER — TECHNETIUM TC 99M SESTAMIBI - CARDIOLITE
13.4300 | Freq: Once | INTRAVENOUS | Status: AC | PRN
  Administered 2016-01-23: 09:00:00 12.92 via INTRAVENOUS

## 2016-01-23 MED ORDER — TECHNETIUM TC 99M SESTAMIBI - CARDIOLITE
28.7470 | Freq: Once | INTRAVENOUS | Status: AC | PRN
  Administered 2016-01-23: 10:00:00 28.747 via INTRAVENOUS

## 2016-02-01 ENCOUNTER — Ambulatory Visit: Payer: BLUE CROSS/BLUE SHIELD | Admitting: Cardiovascular Disease

## 2016-02-05 ENCOUNTER — Ambulatory Visit (INDEPENDENT_AMBULATORY_CARE_PROVIDER_SITE_OTHER): Payer: Self-pay

## 2016-02-05 DIAGNOSIS — R011 Cardiac murmur, unspecified: Secondary | ICD-10-CM

## 2016-02-05 DIAGNOSIS — R55 Syncope and collapse: Secondary | ICD-10-CM

## 2016-02-06 ENCOUNTER — Other Ambulatory Visit: Payer: Self-pay

## 2016-02-06 ENCOUNTER — Ambulatory Visit (INDEPENDENT_AMBULATORY_CARE_PROVIDER_SITE_OTHER): Payer: Self-pay

## 2016-02-06 DIAGNOSIS — R011 Cardiac murmur, unspecified: Secondary | ICD-10-CM

## 2016-02-06 DIAGNOSIS — R55 Syncope and collapse: Secondary | ICD-10-CM

## 2016-02-07 ENCOUNTER — Ambulatory Visit: Payer: BLUE CROSS/BLUE SHIELD | Admitting: Cardiology

## 2016-02-07 ENCOUNTER — Ambulatory Visit
Admission: RE | Admit: 2016-02-07 | Discharge: 2016-02-07 | Disposition: A | Discharge status: Home / Self Care | Payer: Self-pay | Source: Ambulatory Visit | Attending: Cardiology | Admitting: Cardiology

## 2016-02-07 DIAGNOSIS — R011 Cardiac murmur, unspecified: Secondary | ICD-10-CM | POA: Insufficient documentation

## 2016-02-07 DIAGNOSIS — R55 Syncope and collapse: Secondary | ICD-10-CM | POA: Insufficient documentation

## 2016-02-12 ENCOUNTER — Encounter: Payer: Self-pay | Admitting: Cardiology

## 2016-02-12 ENCOUNTER — Ambulatory Visit (INDEPENDENT_AMBULATORY_CARE_PROVIDER_SITE_OTHER): Payer: Self-pay | Admitting: Cardiology

## 2016-02-12 ENCOUNTER — Other Ambulatory Visit: Payer: BLUE CROSS/BLUE SHIELD

## 2016-02-12 VITALS — BP 116/70 | HR 86 | Ht 67.5 in | Wt 143.4 lb

## 2016-02-12 DIAGNOSIS — E785 Hyperlipidemia, unspecified: Secondary | ICD-10-CM

## 2016-02-12 DIAGNOSIS — I493 Ventricular premature depolarization: Secondary | ICD-10-CM | POA: Insufficient documentation

## 2016-02-12 DIAGNOSIS — I10 Essential (primary) hypertension: Secondary | ICD-10-CM | POA: Insufficient documentation

## 2016-02-12 DIAGNOSIS — F17200 Nicotine dependence, unspecified, uncomplicated: Secondary | ICD-10-CM | POA: Insufficient documentation

## 2016-02-12 MED ORDER — METOPROLOL SUCCINATE ER 25 MG PO TB24: 25.0000 mg | ORAL_TABLET | Freq: Every day | ORAL | Status: DC

## 2016-02-12 MED ORDER — VARENICLINE TARTRATE 1 MG PO TABS: 1.0000 mg | ORAL_TABLET | Freq: Two times a day (BID) | ORAL | Status: DC

## 2016-02-12 MED ORDER — VARENICLINE TARTRATE 0.5 MG X 11 & 1 MG X 42 PO MISC: ORAL | Status: DC

## 2016-02-12 MED ORDER — LOSARTAN POTASSIUM 50 MG PO TABS: 25.0000 mg | ORAL_TABLET | Freq: Every day | ORAL | Status: DC

## 2016-02-12 NOTE — Progress Notes (Signed)
Cardiology Office Note   Date:  02/12/2016   ID:  Collins Ceclia, Diles 1965/10/25, MRN [id number]  Referring Doctor:  Keith Rake, MD   Cardiologist:   Wende Bushy, MD   Reason for consultation:  Follow-up after tests    History of Present Illness: Heather Mahoney is a 50 y.o. female who presents for follow-up after tests.  Patient has been doing quite well. Blood pressure has been within the normal range. She has had no recurrence of loss of consciousness or falling to the floor.  In terms of the PVCs, she reports that she feels occasionally her heart pounding/heartbeat stronger. No sensation that it is beating very fast.  In terms of the shortness breath, no progression. Mild in intensity, no chest pain.   In terms of cigarette smoking, she is currently using half a pack per day. She wants to stop eventually but is requesting for a prescription for Chantix. She would like to try this medication.  Otherwise, no headache, fever, cough, colds, abdominal pain, orthopnea, PND, edema.  ROS:  Please see the history of present illness. Aside from mentioned under HPI, all other systems are reviewed and negative.    Past Medical History  Diagnosis Date  . GERD (gastroesophageal reflux disease)   . Hypertension     On Hydrochlorothiazide  . Heart murmur   . Hyperlipidemia   . Hiatal hernia     Past Surgical History  Procedure Laterality Date  . Breast surgery  1986    I&D for 'milk duct'  . Salpingoophorectomy Left 2000     reports that she has been smoking Cigarettes.  She has a 9.5 pack-year smoking history. She has never used smokeless tobacco. She reports that she drinks alcohol. She reports that she uses illicit drugs (Marijuana).   family history includes Asthma in her mother; Diabetes in her maternal grandmother and mother; Heart attack in her mother; Heart disease in her maternal grandmother and mother; Hyperlipidemia in her maternal grandmother and mother;  Hypertension in her mother; Sarcoidosis in her mother.   Current Outpatient Prescriptions  Medication Sig Dispense Refill  . albuterol (PROVENTIL HFA;VENTOLIN HFA) 108 (90 Base) MCG/ACT inhaler Inhale 2 puffs into the lungs every 6 (six) hours as needed for wheezing or shortness of breath. 1 Inhaler 2  . atorvastatin (LIPITOR) 20 MG tablet Take 1 tablet (20 mg total) by mouth daily. 90 tablet 0  . butalbital-acetaminophen-caffeine (FIORICET) 50-325-40 MG tablet Take 1-2 tablets by mouth every 6 (six) hours as needed for headache. 20 tablet 0  . losartan (COZAAR) 50 MG tablet Take 1 tablet (50 mg total) by mouth daily. 30 tablet 11  . Vitamin D, Ergocalciferol, (DRISDOL) 50000 units CAPS capsule Take 1 capsule (50,000 Units total) by mouth every 7 (seven) days. 12 capsule 0   No current facility-administered medications for this visit.    Allergies: Strawberry extract    PHYSICAL EXAM: VS:  BP 116/70 mmHg  Pulse 86  Ht 5' 7.5" (1.715 m)  Wt 143 lb 6.4 oz (65.046 kg)  BMI 22.12 kg/m2  LMP  (Approximate) , Body mass index is 22.12 kg/(m^2). Wt Readings from Last 3 Encounters:  02/12/16 143 lb 6.4 oz (65.046 kg)  01/17/16 136 lb 8 oz (61.916 kg)  01/11/16 138 lb (62.596 kg)   GENERAL:  well developed, well nourished, not in acute distress HEENT: normocephalic, pink conjunctivae, anicteric sclerae, no xanthelasma, normal dentition, oropharynx clear NECK:  no neck vein engorgement, JVP normal,  no hepatojugular reflux, carotid upstroke brisk and symmetric, no bruit, no thyromegaly, no lymphadenopathy LUNGS:  good respiratory effort, clear to auscultation bilaterally CV:  PMI not displaced, no thrills, no lifts, S1 and S2 within normal limits, no palpable S3 or S4, no rubs, no gallops, soft 2 to 3/6 systolic ejection murmur, nonradiating - not noted today ABD:  Soft, nontender, nondistended, normoactive bowel sounds, no abdominal aortic bruit, no hepatomegaly, no splenomegaly MS: nontender  back, no kyphosis, no scoliosis, no joint deformities EXT:  2+ DP/PT pulses, no edema, no varicosities, no cyanosis, no clubbing SKIN: warm, nondiaphoretic, normal turgor, no ulcers NEUROPSYCH: alert, oriented to person, place, and time, sensory/motor grossly intact, normal mood, appropriate affect  Recent Labs: 01/08/2016: ALT 13*; BUN 13; Creatinine, Ser 0.69; Hemoglobin 12.9; Platelets 331; Potassium 3.5; Sodium 136   Lipid Panel    Component Value Date/Time   CHOL 226* 01/11/2016 1000   TRIG 85 01/11/2016 1000   HDL 51 01/11/2016 1000   CHOLHDL 4.4 01/11/2016 1000   LDLCALC 158* 01/11/2016 1000     Other studies Reviewed:  EKG:  EKG from 02/12/2016 was personally reviewed by me and it showed sinus rhythm, 86 BPM. Occasional PVCs.  The ekg from 01/17/2016  was personally reviewed by me and it revealed sinus rhythm with occasional PVCs. Nonspecific T-wave abnormality. Ventricular rate 92 BPM.  Additional studies/ records that were reviewed personally reviewed by me today include:  Echocardiogram 02/06/2016: Left ventricle: The cavity size was normal. Systolic function was  normal. The estimated ejection fraction was in the range of 55%  to 60%. Wall motion was normal; there were no regional wall  motion abnormalities. Left ventricular diastolic function  parameters were normal. - Mitral valve: There was mild regurgitation. - Right ventricle: Systolic function was normal. - Pulmonary arteries: Systolic pressure was within the normal  range. - Pericardium, extracardiac: A trivial pericardial effusion was  identified.  Holter monitor for 02/05/2016: Overall rhythm was sinus rhythm, with average heart rate of 88 BPM. Minimal heart rate of 57 bpm at 5:24 AM 02/06/2016. Maximum heart rate of 136 BPM and 1747 02/05/2016. 10% total number of beats in tachycardia. Longest RR interval was 1.46 seconds.  No high grade supraventricular ectopy: 4 isolated PACs.   Ventricular  ectopy: 14,946 isolated PVCs. 53 ventricular couplets. No ventricular runs. 12% of total number of beats were ventricular beats.  No documented atrial fibrillation.     Exercise nuclear stress test 01/23/2016:  Blood pressure demonstrated a hypertensive response to exercise.  There was no ST segment deviation noted during stress.  No T wave inversion was noted during stress.  Exercise myocardial perfusion imaging study with no significant ischemia Normal wall motion, EF estimated at 46% Depressed EF likely secondary to GI uptake artifact  No EKG changes concerning for ischemia at peak stress or in recovery. Rare PVCs noted Good exercise tolerance, 10.1 METS achieved Low risk scan  ASSESSMENT AND PLAN:  Fall and question of loss of consciousness Based on her story in recent changes in her medications, it may have been related to orthostatic drop in her blood pressure.She hasn't had any recurrence of her symptoms.  Blood pressure has been within the normal range at home on just the losartan 50 once a day. No ischemia noted on exercise nuclear stress test. LVEF within the normal range on echo. Patient reassured.  Systolic murmur No significant valvular problem on echocardiogram.  Shortness of breath No ischemia on exercise stress test. Findings on  the stress is portends a low risk of clinically significant CAD. This was discussed in detail with the patient. Risk factor modification recommended.  PVCs noted on EKG Holter monitor revealed almost 15,000 isolated PVCs, monomorphic. Patient reports occasionally feeling some pounding in her chest. Recommend to start metoprolol ER 25 mg by mouth daily. In light of her blood pressure being in the normal range now, recommend to decrease losartan to 25 mg by mouth daily. Continue to monitor BP. Grip report to our office if blood pressures greater 140/80, or on the low side, or if with symptoms.  Hyperlipidemia PCP following. Patient  on statin therapy.  Hypertension Continue blood pressure log. See discussion above.  Tobacco use disorder Commended patient on her desire to quit. Patient requesting a trial with Chantix. We discussed possible side effects with Chantix, patient report to our office if with issues with mood or suicidal ideation. Patient will see if she can afford Chantix and will likely quit smoking a week after initiation of Chantix.  Current medicines are reviewed at length with the patient today.  The patient does not have concerns regarding medicines.  Labs/ tests ordered today include:  No orders of the defined types were placed in this encounter.    I had a lengthy and detailed discussion with the patient regarding diagnoses, prognosis, diagnostic options, treatment options , and side effects of medications.   I counseled the patient on importance of lifestyle modification including heart healthy diet, regular physical activity, and smoking cessation.  Disposition:   FU with undersigned in one month   I spent at least 40 minutes with the patient today and more than 50% of the time was spent counseling the patient and coordinating care.    Signed, Wende Bushy, MD  02/12/2016 9:46 AM    [place]

## 2016-02-12 NOTE — Patient Instructions (Addendum)
Medication Instructions:  Your physician has recommended you make the following change in your medication:  1. Decrease Losartan 50 mg to 1/2 tablet once daily 2. Start metoprolol 25 mg Once daily 3. Chantix starting pack prescription and then follow up months sent in to your pharmacy.    Labwork: None ordered  Testing/Procedures: None ordered  Follow-Up: Your physician recommends that you schedule a follow-up appointment in: 1 month with Dr. Yvone Neu  Date & Time:______________________________________________________________   Any Other Special Instructions Will Be Listed Below (If Applicable). Your physician has requested that you regularly monitor and record your blood pressure readings at home. Please use the same machine at the same time of day to check your readings and record them to bring to your follow-up visit.     If you need a refill on your cardiac medications before your next appointment, please call your pharmacy.  You Can Quit Smoking If you are ready to quit smoking or are thinking about it, congratulations! You have chosen to help yourself be healthier and live longer! There are lots of different ways to quit smoking. Nicotine gum, nicotine patches, a nicotine inhaler, or nicotine nasal spray can help with physical craving. Hypnosis, support groups, and medicines help break the habit of smoking. TIPS TO GET OFF AND STAY OFF CIGARETTES  Learn to predict your moods. Do not let a bad situation be your excuse to have a cigarette. Some situations in your life might tempt you to have a cigarette.  Ask friends and co-workers not to smoke around you.  Make your home smoke-free.  Never have "just one" cigarette. It leads to wanting another and another. Remind yourself of your decision to quit.  On a card, make a list of your reasons for not smoking. Read it at least the same number of times a day as you have a cigarette. Tell yourself everyday, "I do not want to smoke. I  choose not to smoke."  Ask someone at home or work to help you with your plan to quit smoking.  Have something planned after you eat or have a cup of coffee. Take a walk or get other exercise to perk you up. This will help to keep you from overeating.  Try a relaxation exercise to calm you down and decrease your stress. Remember, you may be tense and nervous the first two weeks after you quit. This will pass.  Find new activities to keep your hands busy. Play with a pen, coin, or rubber band. Doodle or draw things on paper.  Brush your teeth right after eating. This will help cut down the craving for the taste of tobacco after meals. You can try mouthwash too.  Try gum, breath mints, or diet candy to keep something in your mouth. IF YOU SMOKE AND WANT TO QUIT:  Do not stock up on cigarettes. Never buy a carton. Wait until one pack is finished before you buy another.  Never carry cigarettes with you at work or at home.  Keep cigarettes as far away from you as possible. Leave them with someone else.  Never carry matches or a lighter with you.  Ask yourself, "Do I need this cigarette or is this just a reflex?"  Bet with someone that you can quit. Put cigarette money in a piggy bank every morning. If you smoke, you give up the money. If you do not smoke, by the end of the week, you keep the money.  Keep trying. It takes 21 days  to change a habit!  Talk to your doctor about using medicines to help you quit. These include nicotine replacement gum, lozenges, or skin patches.   This information is not intended to replace advice given to you by your health care provider. Make sure you discuss any questions you have with your health care provider.   Document Released: 08/02/2009 Document Revised: 12/29/2011 Document Reviewed: 08/02/2009 Elsevier Interactive Patient Education [employer].

## 2016-02-26 ENCOUNTER — Ambulatory Visit: Payer: BLUE CROSS/BLUE SHIELD | Admitting: Cardiology

## 2016-03-12 ENCOUNTER — Encounter: Payer: Self-pay | Admitting: Cardiology

## 2016-03-12 ENCOUNTER — Ambulatory Visit: Payer: Self-pay | Admitting: Cardiology

## 2016-04-10 ENCOUNTER — Ambulatory Visit: Payer: BLUE CROSS/BLUE SHIELD | Admitting: Family Medicine

## 2016-08-26 ENCOUNTER — Emergency Department: Payer: Self-pay

## 2016-08-26 ENCOUNTER — Emergency Department
Admission: EM | Admit: 2016-08-26 | Discharge: 2016-08-26 | Disposition: A | Discharge status: Home / Self Care | Payer: Self-pay | Attending: Emergency Medicine | Admitting: Emergency Medicine

## 2016-08-26 DIAGNOSIS — F17210 Nicotine dependence, cigarettes, uncomplicated: Secondary | ICD-10-CM | POA: Insufficient documentation

## 2016-08-26 DIAGNOSIS — I10 Essential (primary) hypertension: Secondary | ICD-10-CM | POA: Insufficient documentation

## 2016-08-26 DIAGNOSIS — A5901 Trichomonal vulvovaginitis: Secondary | ICD-10-CM | POA: Insufficient documentation

## 2016-08-26 DIAGNOSIS — R109 Unspecified abdominal pain: Secondary | ICD-10-CM

## 2016-08-26 DIAGNOSIS — A599 Trichomoniasis, unspecified: Secondary | ICD-10-CM

## 2016-08-26 DIAGNOSIS — N898 Other specified noninflammatory disorders of vagina: Secondary | ICD-10-CM

## 2016-08-26 DIAGNOSIS — Z79899 Other long term (current) drug therapy: Secondary | ICD-10-CM | POA: Insufficient documentation

## 2016-08-26 LAB — WET PREP, GENITAL
Clue Cells Wet Prep HPF POC: NONE SEEN
SPERM: NONE SEEN
Yeast Wet Prep HPF POC: NONE SEEN

## 2016-08-26 LAB — URINALYSIS COMPLETE WITH MICROSCOPIC (ARMC ONLY)
BILIRUBIN URINE: NEGATIVE
Bacteria, UA: NONE SEEN
Glucose, UA: NEGATIVE mg/dL
KETONES UR: NEGATIVE mg/dL
NITRITE: NEGATIVE
PH: 6 (ref 5.0–8.0)
PROTEIN: NEGATIVE mg/dL
SPECIFIC GRAVITY, URINE: 1.016 (ref 1.005–1.030)

## 2016-08-26 LAB — CHLAMYDIA/NGC RT PCR (ARMC ONLY)
CHLAMYDIA TR: NOT DETECTED
N GONORRHOEAE: NOT DETECTED

## 2016-08-26 LAB — BASIC METABOLIC PANEL
Anion gap: 6 (ref 5–15)
BUN: 14 mg/dL (ref 6–20)
CALCIUM: 9.5 mg/dL (ref 8.9–10.3)
CHLORIDE: 106 mmol/L (ref 101–111)
CO2: 29 mmol/L (ref 22–32)
CREATININE: 0.75 mg/dL (ref 0.44–1.00)
GFR calc Af Amer: >60 mL/min (ref >60)
GFR calc non Af Amer: >60 mL/min (ref >60)
Glucose, Bld: 116 mg/dL — ABNORMAL HIGH (ref 65–99)
Potassium: 3.8 mmol/L (ref 3.5–5.1)
Sodium: 141 mmol/L (ref 135–145)

## 2016-08-26 LAB — CBC
HEMATOCRIT: 33.7 % — AB (ref 35.0–47.0)
HEMOGLOBIN: 11.6 g/dL — AB (ref 12.0–16.0)
MCH: 28.6 pg (ref 26.0–34.0)
MCHC: 34.5 g/dL (ref 32.0–36.0)
MCV: 83 fL (ref 80.0–100.0)
Platelets: 400 10*3/uL (ref 150–440)
RBC: 4.06 MIL/uL (ref 3.80–5.20)
RDW: 16.3 % — AB (ref 11.5–14.5)
WBC: 15.7 10*3/uL — ABNORMAL HIGH (ref 3.6–11.0)

## 2016-08-26 LAB — PREGNANCY, URINE: PREG TEST UR: NEGATIVE

## 2016-08-26 MED ORDER — METOPROLOL TARTRATE 25 MG PO TABS
25.0000 mg | ORAL_TABLET | Freq: Once | ORAL | Status: AC
  Administered 2016-08-26: 25 mg via ORAL

## 2016-08-26 MED ORDER — METRONIDAZOLE 500 MG PO TABS
500.0000 mg | ORAL_TABLET | Freq: Once | ORAL | Status: AC
  Administered 2016-08-26: 500 mg via ORAL

## 2016-08-26 MED ORDER — LOSARTAN POTASSIUM 50 MG PO TABS
ORAL_TABLET | ORAL | Status: AC
  Administered 2016-08-26: 25 mg via ORAL
  Filled 2016-08-26: qty 1

## 2016-08-26 MED ORDER — LOSARTAN POTASSIUM 50 MG PO TABS
25.0000 mg | ORAL_TABLET | Freq: Once | ORAL | Status: AC
  Administered 2016-08-26: 25 mg via ORAL

## 2016-08-26 MED ORDER — METRONIDAZOLE 500 MG PO TABS: 500.0000 mg | ORAL_TABLET | Freq: Two times a day (BID) | ORAL | 0 refills | Status: AC

## 2016-08-26 MED ORDER — METOPROLOL TARTRATE 25 MG PO TABS
ORAL_TABLET | ORAL | Status: AC
Start: 2016-08-26 — End: 2016-08-26
  Administered 2016-08-26: 25 mg via ORAL
  Filled 2016-08-26: qty 1

## 2016-08-26 MED ORDER — METRONIDAZOLE 500 MG PO TABS
ORAL_TABLET | ORAL | Status: AC
  Administered 2016-08-26: 500 mg via ORAL
  Filled 2016-08-26: qty 1

## 2016-08-26 NOTE — ED Provider Notes (Signed)
[facility] Emergency Department Provider Note   ____________________________________________   First MD Initiated Contact with Patient 08/26/16 [phone]     (approximate)  I have reviewed the triage vital signs and the nursing notes.   HISTORY  Chief Complaint Abdominal Pain    HPI Heather Mahoney is a 50 y.o. female who comes into the hospital today with some lower abdominal pain. She reports that the pain started tonight around 9:00. The patient was at work when it started. She did not need to have a bowel movement. 3 weeks ago she had what she thought was a yeast infection and some Monistat. She reports though that the vaginal discharge returned after a few days. She reports that she use the bathroom and noticed some yellowish-brown discharge she decided to come into the hospital. The patient does have a sinus infection and has a cough from that which she has been treating with over-the-counter medication. The patient reports that her pain is a 4 out of 10 in intensity. She is sexually active and reports a month ago she did not use protection with her ex significant other. The patient denies any nausea or vomiting, chest pain, shortness breath, dizziness, headache. The patient also denies any dysuria. She is here for evaluation.   Past Medical History:  Diagnosis Date  . GERD (gastroesophageal reflux disease)   . Heart murmur   . Hiatal hernia   . Hyperlipidemia   . Hypertension    On Hydrochlorothiazide    Patient Active Problem List   Diagnosis Date Noted  . PVC (premature ventricular contraction) 02/12/2016  . Essential hypertension, benign 02/12/2016  . Tobacco use disorder 02/12/2016  . Migraine headache 01/09/2016  . Lymphocytosis 12/21/2015  . Hypertension 12/12/2015  . Hyperlipidemia 12/12/2015  . Vitamin D deficiency 12/12/2015  . Cardiac murmur 12/12/2015  . Hypertensive urgency 11/29/2015  . Abdominal pulsatile mass 11/29/2015     Past Surgical History:  Procedure Laterality Date  . BREAST SURGERY  1986   I&D for 'milk duct'  . SALPINGOOPHORECTOMY Left 2000    Prior to Admission medications   Medication Sig Start Date End Date Taking? Authorizing Provider  albuterol (PROVENTIL HFA;VENTOLIN HFA) 108 (90 Base) MCG/ACT inhaler Inhale 2 puffs into the lungs every 6 (six) hours as needed for wheezing or shortness of breath. 01/11/16   Lavonia Drafts, MD  atorvastatin (LIPITOR) 20 MG tablet Take 1 tablet (20 mg total) by mouth daily. 01/16/16   Roselee Nova, MD  butalbital-acetaminophen-caffeine (FIORICET) [phone] MG tablet Take 1-2 tablets by mouth every 6 (six) hours as needed for headache. 01/08/16 01/07/17  Orbie Pyo, MD  losartan (COZAAR) 50 MG tablet Take 0.5 tablets (25 mg total) by mouth daily. 02/12/16   Wende Bushy, MD  metoprolol succinate (TOPROL XL) 25 MG 24 hr tablet Take 1 tablet (25 mg total) by mouth daily. 02/12/16   Wende Bushy, MD  metroNIDAZOLE (FLAGYL) 500 MG tablet Take 1 tablet (500 mg total) by mouth 2 (two) times daily. 08/26/16 09/02/16  Loney Hering, MD  varenicline (CHANTIX CONTINUING MONTH PAK) 1 MG tablet Take 1 tablet (1 mg total) by mouth 2 (two) times daily. 02/12/16   Wende Bushy, MD  varenicline (CHANTIX STARTING MONTH PAK) 0.5 MG X 11 & 1 MG X 42 tablet Take one 0.5 mg tablet by mouth once daily for 3 days, then increase to one 0.5 mg tablet twice daily for 4 days, then increase to one 1 mg  tablet twice daily. 02/12/16   Wende Bushy, MD  Vitamin D, Ergocalciferol, (DRISDOL) 50000 units CAPS capsule Take 1 capsule (50,000 Units total) by mouth every 7 (seven) days. 12/12/15   Roselee Nova, MD    Allergies Strawberry extract  Family History  Problem Relation Age of Onset  . Diabetes Mother   . Hypertension Mother   . Hyperlipidemia Mother   . Heart disease Mother     CABG X 4  . Asthma Mother   . Sarcoidosis Mother     In remission  . Heart attack Mother    . Diabetes Maternal Grandmother   . Heart disease Maternal Grandmother   . Hyperlipidemia Maternal Grandmother     Social History Social History  Substance Use Topics  . Smoking status: Current Every Day Smoker    Packs/day: 0.25    Years: 38.00    Types: Cigarettes  . Smokeless tobacco: Never Used  . Alcohol use Yes     Comment: occassional    Review of Systems Constitutional: No fever/chills Eyes: No visual changes. ENT: No sore throat. Cardiovascular: Denies chest pain. Respiratory: Denies shortness of breath. Gastrointestinal:  abdominal pain.  No nausea, no vomiting.  No diarrhea.  No constipation. Genitourinary: Vaginal discharge Musculoskeletal: Negative for back pain. Skin: Negative for rash. Neurological: Negative for headaches, focal weakness or numbness.  10-point ROS otherwise negative.  ____________________________________________   PHYSICAL EXAM:  VITAL SIGNS: ED Triage Vitals  Enc Vitals Group     BP 08/26/16 0042 (!) 179/107     Pulse Rate 08/26/16 0042 (!) 102     Resp 08/26/16 0042 18     Temp 08/26/16 0042 98.4 F (36.9 C)     Temp Source 08/26/16 0042 Oral     SpO2 08/26/16 0042 100 %     Weight 08/26/16 0039 140 lb (63.5 kg)     Height 08/26/16 0039 5\' 7"  (1.702 m)     Head Circumference --      Peak Flow --      Pain Score 08/26/16 0039 4     Pain Loc --      Pain Edu? --      Excl. in [place]? --     Constitutional: Alert and oriented. Well appearing and in Mild distress. Eyes: Conjunctivae are normal. PERRL. EOMI. Head: Atraumatic. Nose: No congestion/rhinnorhea. Mouth/Throat: Mucous membranes are moist.  Oropharynx non-erythematous. Cardiovascular: Normal rate, regular rhythm. Grossly normal heart sounds.  Good peripheral circulation. Respiratory: Normal respiratory effort.  No retractions. Lungs CTAB. Gastrointestinal: Soft and nontender. No distention. Positive bowel sounds Genitourinary: Normal external genitalia with some  yellowish vaginal discharge. Patient has some midline tenderness to palpation with minimal adnexal tenderness to palpation. Musculoskeletal: No lower extremity tenderness nor edema.  Neurologic:  Normal speech and language.  Skin:  Skin is warm, dry and intact.  Psychiatric: Mood and affect are normal.   ____________________________________________   LABS (all labs ordered are listed, but only abnormal results are displayed)  Labs Reviewed  WET PREP, GENITAL - Abnormal; Notable for the following:       Result Value   Trich, Wet Prep PRESENT (*)    WBC, Wet Prep HPF POC MANY (*)    All other components within normal limits  URINALYSIS COMPLETEWITH MICROSCOPIC ([facility] ONLY) - Abnormal; Notable for the following:    Color, Urine YELLOW (*)    APPearance CLEAR (*)    Hgb urine dipstick 1+ (*)    Leukocytes, UA  3+ (*)    Squamous Epithelial / LPF 0-5 (*)    All other components within normal limits  CBC - Abnormal; Notable for the following:    WBC 15.7 (*)    Hemoglobin 11.6 (*)    HCT 33.7 (*)    RDW 16.3 (*)    All other components within normal limits  BASIC METABOLIC PANEL - Abnormal; Notable for the following:    Glucose, Bld 116 (*)    All other components within normal limits  CHLAMYDIA/NGC RT PCR ([facility] ONLY)  PREGNANCY, URINE   ____________________________________________  EKG  none ____________________________________________  RADIOLOGY  Pelvic ultrasound ____________________________________________   PROCEDURES  Procedure(s) performed: None  Procedures  Critical Care performed: No  ____________________________________________   INITIAL IMPRESSION / ASSESSMENT AND PLAN / ED COURSE  Pertinent labs & imaging results that were available during my care of the patient were reviewed by me and considered in my medical decision making (see chart for details).  This is a 50 year old female who comes into the hospital today with abdominal pain and  vaginal discharge. The patient was unsure what was going on so she decided to come into the hospital. Her pain is minimal at this time. I Wilson the patient for an ultrasound as I am awaiting the results of her wet prep. The patient is resting comfortably at this time.  Clinical Course as of Aug 27 751  Tue Aug 26, 2016  [id number] Normal ultrasound appearance of the uterus and right ovary. Surgical absence of left ovary. US Pelvis Complete [AW]    Clinical Course User Index [AW] Loney Hering, MD    It appears that the patient has Trichomonas. 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 I will give the patient a dose of metronidazole and her home blood pressure medications as she did not take them before she came in tonight. The patient is feeling well and has no other complaints. She'll be discharged to follow-up with her primary care physician as well as her OB/GYN. I did advise the patient to inform her partner of the Trichomonas that he can be treated as well. ____________________________________________   FINAL CLINICAL IMPRESSION(S) / ED DIAGNOSES  Final diagnoses:  Abdominal pain  Trichomonas vaginalis infection  Vaginal discharge      NEW MEDICATIONS STARTED DURING THIS VISIT:  Discharge Medication List as of 08/26/2016  4:50 AM    START taking these medications   Details  metroNIDAZOLE (FLAGYL) 500 MG tablet Take 1 tablet (500 mg total) by mouth 2 (two) times daily., Starting Tue 08/26/2016, Until Tue 09/02/2016, Print         Note:  This document was prepared using Dragon voice recognition software and may include unintentional dictation errors.    Loney Hering, MD 08/26/16 [phone]

## 2016-08-26 NOTE — ED Notes (Signed)
Pt also c/o vaginal discharge, reports "yellowish-brown" discharge tonight, pt reports used diaper rash for itching with relief. Pt has hx of HTN reports did not take BP medicine yesterday.

## 2016-08-26 NOTE — ED Triage Notes (Signed)
Patient ambulatory to triage with steady gait, without difficulty or distress noted; pt reports mid lower abd pain radiating into back last few days with no accomp symptoms

## 2016-11-10 ENCOUNTER — Encounter: Payer: Self-pay | Admitting: Emergency Medicine

## 2016-11-10 DIAGNOSIS — I160 Hypertensive urgency: Secondary | ICD-10-CM | POA: Insufficient documentation

## 2016-11-10 DIAGNOSIS — Z79899 Other long term (current) drug therapy: Secondary | ICD-10-CM | POA: Insufficient documentation

## 2016-11-10 DIAGNOSIS — F17210 Nicotine dependence, cigarettes, uncomplicated: Secondary | ICD-10-CM | POA: Insufficient documentation

## 2016-11-10 DIAGNOSIS — R1031 Right lower quadrant pain: Secondary | ICD-10-CM | POA: Insufficient documentation

## 2016-11-10 DIAGNOSIS — F1290 Cannabis use, unspecified, uncomplicated: Secondary | ICD-10-CM | POA: Insufficient documentation

## 2016-11-10 LAB — URINALYSIS, COMPLETE (UACMP) WITH MICROSCOPIC
Bilirubin Urine: NEGATIVE
Glucose, UA: NEGATIVE mg/dL
Ketones, ur: NEGATIVE mg/dL
LEUKOCYTES UA: NEGATIVE
NITRITE: NEGATIVE
PROTEIN: NEGATIVE mg/dL
Specific Gravity, Urine: 1.018 (ref 1.005–1.030)
pH: 5 (ref 5.0–8.0)

## 2016-11-10 LAB — COMPREHENSIVE METABOLIC PANEL
ALT: 16 U/L (ref 14–54)
AST: 24 U/L (ref 15–41)
Albumin: 4.6 g/dL (ref 3.5–5.0)
Alkaline Phosphatase: 77 U/L (ref 38–126)
Anion gap: 9 (ref 5–15)
BILIRUBIN TOTAL: 0.4 mg/dL (ref 0.3–1.2)
BUN: 12 mg/dL (ref 6–20)
CHLORIDE: 104 mmol/L (ref 101–111)
CO2: 28 mmol/L (ref 22–32)
CREATININE: 0.8 mg/dL (ref 0.44–1.00)
Calcium: 9.5 mg/dL (ref 8.9–10.3)
Glucose, Bld: 117 mg/dL — ABNORMAL HIGH (ref 65–99)
POTASSIUM: 4 mmol/L (ref 3.5–5.1)
Sodium: 141 mmol/L (ref 135–145)
TOTAL PROTEIN: 8.1 g/dL (ref 6.5–8.1)

## 2016-11-10 LAB — CBC
HCT: 35.7 % (ref 35.0–47.0)
Hemoglobin: 12.3 g/dL (ref 12.0–16.0)
MCH: 28.5 pg (ref 26.0–34.0)
MCHC: 34.3 g/dL (ref 32.0–36.0)
MCV: 83 fL (ref 80.0–100.0)
PLATELETS: 413 10*3/uL (ref 150–440)
RBC: 4.3 MIL/uL (ref 3.80–5.20)
RDW: 16.4 % — ABNORMAL HIGH (ref 11.5–14.5)
WBC: 13.8 10*3/uL — ABNORMAL HIGH (ref 3.6–11.0)

## 2016-11-10 LAB — LIPASE, BLOOD: LIPASE: 32 U/L (ref 11–51)

## 2016-11-10 LAB — POCT PREGNANCY, URINE: PREG TEST UR: NEGATIVE

## 2016-11-10 NOTE — ED Triage Notes (Signed)
C/O RLQ abdominal pain x 5 hours.  Nausea as well.  Denies dysuria.

## 2016-11-11 ENCOUNTER — Emergency Department
Admission: EM | Admit: 2016-11-11 | Discharge: 2016-11-11 | Disposition: A | Discharge status: Home / Self Care | Payer: Self-pay | Attending: Emergency Medicine | Admitting: Emergency Medicine

## 2016-11-11 ENCOUNTER — Encounter: Payer: Self-pay | Admitting: Radiology

## 2016-11-11 ENCOUNTER — Emergency Department: Payer: Self-pay

## 2016-11-11 DIAGNOSIS — R1031 Right lower quadrant pain: Secondary | ICD-10-CM

## 2016-11-11 MED ORDER — IOPAMIDOL (ISOVUE-300) INJECTION 61%
30.0000 mL | Freq: Once | INTRAVENOUS | Status: AC | PRN
  Administered 2016-11-11: 30 mL via ORAL

## 2016-11-11 MED ORDER — IOPAMIDOL (ISOVUE-300) INJECTION 61%
100.0000 mL | Freq: Once | INTRAVENOUS | Status: AC | PRN
  Administered 2016-11-11: 100 mL via INTRAVENOUS

## 2016-11-11 MED ORDER — POLYETHYLENE GLYCOL 3350 17 G PO PACK: 17.0000 g | PACK | Freq: Every day | ORAL | 0 refills | Status: DC

## 2016-11-11 NOTE — ED Provider Notes (Signed)
[facility] Emergency Department Provider Note   ____________________________________________   First MD Initiated Contact with Patient 11/11/16 [phone]     (approximate)  I have reviewed the triage vital signs and the nursing notes.   HISTORY  Chief Complaint Abdominal Pain    HPI Heather Mahoney is a 51 y.o. female who comes into the hospital today with right lower quadrant pain. She reports it started between 4 and 5 PM. The patient was at work when it started. He feels that someone is stabbing her in the right lower quadrant. She reports that it comes every now and then. She didn't take anything for pain. She went to lunch around 9:00 and then after eating became nauseous so she decided to come in and get checked out. The patient rates her pain a 1 out of 10 in intensity currently but she reports it does come and go. She reports she had a long time ago whenever she had yeast or bacterial infection but she denies any vaginal discharge. The patient denies any fevers. The patient has had a salpingo-oophorectomy but she reports she forgot on which side. She is concerned about appendicitis which is why she came in tonight.   Past Medical History:  Diagnosis Date  . GERD (gastroesophageal reflux disease)   . Heart murmur   . Hiatal hernia   . Hyperlipidemia   . Hypertension    On Hydrochlorothiazide    Patient Active Problem List   Diagnosis Date Noted  . PVC (premature ventricular contraction) 02/12/2016  . Essential hypertension, benign 02/12/2016  . Tobacco use disorder 02/12/2016  . Migraine headache 01/09/2016  . Lymphocytosis 12/21/2015  . Hypertension 12/12/2015  . Hyperlipidemia 12/12/2015  . Vitamin D deficiency 12/12/2015  . Cardiac murmur 12/12/2015  . Hypertensive urgency 11/29/2015  . Abdominal pulsatile mass 11/29/2015    Past Surgical History:  Procedure Laterality Date  . BREAST SURGERY  1986   I&D for 'milk duct'  .  SALPINGOOPHORECTOMY Left 2000    Prior to Admission medications   Medication Sig Start Date End Date Taking? Authorizing Provider  albuterol (PROVENTIL HFA;VENTOLIN HFA) 108 (90 Base) MCG/ACT inhaler Inhale 2 puffs into the lungs every 6 (six) hours as needed for wheezing or shortness of breath. 01/11/16   Lavonia Drafts, MD  atorvastatin (LIPITOR) 20 MG tablet Take 1 tablet (20 mg total) by mouth daily. 01/16/16   Roselee Nova, MD  butalbital-acetaminophen-caffeine (FIORICET) [phone] MG tablet Take 1-2 tablets by mouth every 6 (six) hours as needed for headache. 01/08/16 01/07/17  Orbie Pyo, MD  losartan (COZAAR) 50 MG tablet Take 0.5 tablets (25 mg total) by mouth daily. 02/12/16   Wende Bushy, MD  metoprolol succinate (TOPROL XL) 25 MG 24 hr tablet Take 1 tablet (25 mg total) by mouth daily. 02/12/16   Wende Bushy, MD  polyethylene glycol [facility]) packet Take 17 g by mouth daily. 11/11/16   Loney Hering, MD  varenicline (CHANTIX CONTINUING MONTH PAK) 1 MG tablet Take 1 tablet (1 mg total) by mouth 2 (two) times daily. 02/12/16   Wende Bushy, MD  varenicline (CHANTIX STARTING MONTH PAK) 0.5 MG X 11 & 1 MG X 42 tablet Take one 0.5 mg tablet by mouth once daily for 3 days, then increase to one 0.5 mg tablet twice daily for 4 days, then increase to one 1 mg tablet twice daily. 02/12/16   Wende Bushy, MD  Vitamin D, Ergocalciferol, (DRISDOL) 50000 units CAPS capsule  Take 1 capsule (50,000 Units total) by mouth every 7 (seven) days. 12/12/15   Roselee Nova, MD    Allergies Strawberry extract  Family History  Problem Relation Age of Onset  . Diabetes Mother   . Hypertension Mother   . Hyperlipidemia Mother   . Heart disease Mother     CABG X 4  . Asthma Mother   . Sarcoidosis Mother     In remission  . Heart attack Mother   . Diabetes Maternal Grandmother   . Heart disease Maternal Grandmother   . Hyperlipidemia Maternal Grandmother     Social History Social  History  Substance Use Topics  . Smoking status: Current Every Day Smoker    Packs/day: 0.25    Years: 38.00    Types: Cigarettes  . Smokeless tobacco: Never Used  . Alcohol use Yes     Comment: occassional    Review of Systems Constitutional: No fever/chills Eyes: No visual changes. ENT: No sore throat. Cardiovascular: Denies chest pain. Respiratory: Denies shortness of breath. Gastrointestinal:  abdominal pain, nausea, no vomiting.  No diarrhea.  No constipation. Genitourinary: Negative for dysuria. Musculoskeletal: Negative for back pain. Skin: Negative for rash. Neurological: Negative for headaches, focal weakness or numbness.  10-point ROS otherwise negative.  ____________________________________________   PHYSICAL EXAM:  VITAL SIGNS: ED Triage Vitals  Enc Vitals Group     BP 11/10/16 2207 (!) 182/102     Pulse Rate 11/10/16 2207 99     Resp 11/10/16 2207 16     Temp 11/10/16 2207 98 F (36.7 C)     Temp Source 11/10/16 2207 Oral     SpO2 11/10/16 2207 97 %     Weight 11/10/16 2206 150 lb (68 kg)     Height 11/10/16 2206 5\' 7"  (1.702 m)     Head Circumference --      Peak Flow --      Pain Score 11/10/16 2206 5     Pain Loc --      Pain Edu? --      Excl. in [place]? --     Constitutional: Alert and oriented. Well appearing and in mild distress. Eyes: Conjunctivae are normal. PERRL. EOMI. Head: Atraumatic. Nose: No congestion/rhinnorhea. Mouth/Throat: Mucous membranes are moist.  Oropharynx non-erythematous. Cardiovascular: Normal rate, regular rhythm. Grossly normal heart sounds.  Good peripheral circulation. Respiratory: Normal respiratory effort.  No retractions. Lungs CTAB. Gastrointestinal: Soft with RLQ tenderness to palpation. No distention. Positive bowel sounds Musculoskeletal: No lower extremity tenderness nor edema.   Neurologic:  Normal speech and language.  Skin:  Skin is warm, dry and intact.  Psychiatric: Mood and affect are normal.    ____________________________________________   LABS (all labs ordered are listed, but only abnormal results are displayed)  Labs Reviewed  COMPREHENSIVE METABOLIC PANEL - Abnormal; Notable for the following:       Result Value   Glucose, Bld 117 (*)    All other components within normal limits  CBC - Abnormal; Notable for the following:    WBC 13.8 (*)    RDW 16.4 (*)    All other components within normal limits  URINALYSIS, COMPLETE (UACMP) WITH MICROSCOPIC - Abnormal; Notable for the following:    Color, Urine YELLOW (*)    APPearance CLEAR (*)    Hgb urine dipstick SMALL (*)    Bacteria, UA RARE (*)    Squamous Epithelial / LPF 0-5 (*)    All other components within normal limits  LIPASE, BLOOD  POCT PREGNANCY, URINE   ____________________________________________  EKG  none ____________________________________________  RADIOLOGY  CT abd and pelvis ____________________________________________   PROCEDURES  Procedure(s) performed: None  Procedures  Critical Care performed: No  ____________________________________________   INITIAL IMPRESSION / ASSESSMENT AND PLAN / ED COURSE  Pertinent labs & imaging results that were available during my care of the patient were reviewed by me and considered in my medical decision making (see chart for details).  This is a 51 year old female who comes into the hospital today with some right lower quadrant abdominal pain. She reports that started earlier this evening but she also has some nausea with it. She reports that it does come and go. She has had a salpingo-oophorectomy but it's on the left. I will send the patient for a CT scan to evaluate her abdomen. She did not have adnexal pain on exam but it was more in her right lower quadrant. The patient will be reassessed once I received the results of her CT scan.  Clinical Course as of Nov 11 404  Tue Nov 11, 2016  [id number] 1. Increased colonic stool burden consistent with  constipation. Slightly thickened appearing sigmoid colon which may be simply due to underdistention. Mild colitis is not entirely excluded. Correlate clinically. Normal appendix. 2. Infrarenal 3.3 cm abdominal aortic aneurysm. Recommend followup by ultrasound in 3 years. This recommendation follows ACR consensus guidelines: White Paper of the ACR Incidental Findings Committee II on Vascular Findings. Joellyn Rued Radiol 2013; [id number]   CT Abdomen Pelvis W Contrast [AW]    Clinical Course User Index [AW] Loney Hering, MD   The patient's CT scan is unremarkable. She does have a colonic stool burden consistent with constipation. She does have some thickening over the sigmoid but does not have pain at that place. The patient's appendix is normal and she has a small abdominal aortic aneurysm that she'll need to follow-up. Otherwise the patient has no other findings on the CT scan. Her pain is controlled at this time. I will discharge the patient home to have her follow-up with her primary care physician. The patient has no questions or concerns at this time and she will be discharged home.  ____________________________________________   FINAL CLINICAL IMPRESSION(S) / ED DIAGNOSES  Final diagnoses:  Right lower quadrant abdominal pain      NEW MEDICATIONS STARTED DURING THIS VISIT:  New Prescriptions   POLYETHYLENE GLYCOL (MIRALAX) PACKET    Take 17 g by mouth daily.     Note:  This document was prepared using Dragon voice recognition software and may include unintentional dictation errors.    Loney Hering, MD 11/11/16 [phone]

## 2016-11-11 NOTE — Discharge Instructions (Signed)
Lee's follow-up with a primary care physician but return to the emergency department with any worsening pain any vomiting any fevers or any other concerns.

## 2017-02-16 ENCOUNTER — Emergency Department
Admission: EM | Admit: 2017-02-16 | Discharge: 2017-02-16 | Disposition: A | Discharge status: Home / Self Care | Payer: Self-pay | Attending: Student in an Organized Health Care Education/Training Program | Admitting: Student in an Organized Health Care Education/Training Program

## 2017-02-16 ENCOUNTER — Encounter: Payer: Self-pay | Admitting: Emergency Medicine

## 2017-02-16 ENCOUNTER — Emergency Department: Payer: Self-pay

## 2017-02-16 DIAGNOSIS — Z79899 Other long term (current) drug therapy: Secondary | ICD-10-CM | POA: Insufficient documentation

## 2017-02-16 DIAGNOSIS — F17210 Nicotine dependence, cigarettes, uncomplicated: Secondary | ICD-10-CM | POA: Insufficient documentation

## 2017-02-16 DIAGNOSIS — R112 Nausea with vomiting, unspecified: Secondary | ICD-10-CM | POA: Insufficient documentation

## 2017-02-16 DIAGNOSIS — R197 Diarrhea, unspecified: Secondary | ICD-10-CM | POA: Insufficient documentation

## 2017-02-16 DIAGNOSIS — I10 Essential (primary) hypertension: Secondary | ICD-10-CM | POA: Insufficient documentation

## 2017-02-16 DIAGNOSIS — R1011 Right upper quadrant pain: Secondary | ICD-10-CM | POA: Insufficient documentation

## 2017-02-16 DIAGNOSIS — R1013 Epigastric pain: Secondary | ICD-10-CM | POA: Insufficient documentation

## 2017-02-16 LAB — URINALYSIS, COMPLETE (UACMP) WITH MICROSCOPIC
BACTERIA UA: NONE SEEN
Bilirubin Urine: NEGATIVE
Glucose, UA: NEGATIVE mg/dL
Ketones, ur: NEGATIVE mg/dL
Leukocytes, UA: NEGATIVE
Nitrite: NEGATIVE
PROTEIN: 30 mg/dL — AB
Specific Gravity, Urine: 1.028 (ref 1.005–1.030)
pH: 5 (ref 5.0–8.0)

## 2017-02-16 LAB — COMPREHENSIVE METABOLIC PANEL
ALBUMIN: 4.6 g/dL (ref 3.5–5.0)
ALK PHOS: 93 U/L (ref 38–126)
ALT: 13 U/L — ABNORMAL LOW (ref 14–54)
AST: 19 U/L (ref 15–41)
Anion gap: 12 (ref 5–15)
BUN: 11 mg/dL (ref 6–20)
CO2: 21 mmol/L — AB (ref 22–32)
Calcium: 9.9 mg/dL (ref 8.9–10.3)
Chloride: 105 mmol/L (ref 101–111)
Creatinine, Ser: 0.6 mg/dL (ref 0.44–1.00)
GFR calc Af Amer: >60 mL/min (ref >60)
GFR calc non Af Amer: >60 mL/min (ref >60)
GLUCOSE: 125 mg/dL — AB (ref 65–99)
POTASSIUM: 3.9 mmol/L (ref 3.5–5.1)
SODIUM: 138 mmol/L (ref 135–145)
Total Bilirubin: 0.9 mg/dL (ref 0.3–1.2)
Total Protein: 8.6 g/dL — ABNORMAL HIGH (ref 6.5–8.1)

## 2017-02-16 LAB — CBC
HEMATOCRIT: 40.9 % (ref 35.0–47.0)
HEMOGLOBIN: 13.5 g/dL (ref 12.0–16.0)
MCH: 27.4 pg (ref 26.0–34.0)
MCHC: 33.1 g/dL (ref 32.0–36.0)
MCV: 82.6 fL (ref 80.0–100.0)
Platelets: 461 10*3/uL — ABNORMAL HIGH (ref 150–440)
RBC: 4.95 MIL/uL (ref 3.80–5.20)
RDW: 16.5 % — ABNORMAL HIGH (ref 11.5–14.5)
WBC: 19 10*3/uL — ABNORMAL HIGH (ref 3.6–11.0)

## 2017-02-16 LAB — LIPASE, BLOOD: Lipase: 21 U/L (ref 11–51)

## 2017-02-16 MED ORDER — PROMETHAZINE HCL 12.5 MG PO TABS: 12.5000 mg | ORAL_TABLET | Freq: Four times a day (QID) | ORAL | 0 refills | Status: DC | PRN

## 2017-02-16 MED ORDER — ONDANSETRON HCL 4 MG/2ML IJ SOLN
4.0000 mg | Freq: Once | INTRAMUSCULAR | Status: AC
  Administered 2017-02-16: 4 mg via INTRAVENOUS
  Filled 2017-02-16: qty 2

## 2017-02-16 MED ORDER — MORPHINE SULFATE (PF) 4 MG/ML IV SOLN
4.0000 mg | INTRAVENOUS | Status: DC | PRN
  Administered 2017-02-16: 4 mg via INTRAVENOUS
  Filled 2017-02-16: qty 1

## 2017-02-16 MED ORDER — SODIUM CHLORIDE 0.9 % IV BOLUS (SEPSIS)
1000.0000 mL | Freq: Once | INTRAVENOUS | Status: AC
  Administered 2017-02-16: 1000 mL via INTRAVENOUS

## 2017-02-16 NOTE — ED Provider Notes (Signed)
[facility] Emergency Department Provider Note    First MD Initiated Contact with Patient 02/16/17 1723     (approximate)  I have reviewed the triage vital signs and the nursing notes.   HISTORY  Chief Complaint Abdominal Pain    HPI Versie M Kamp is a 51 y.o. female presents with acute onset nausea and vomiting and epigastric pain that started this morning after she drank some coffee. Patient states that the pain is worsened with palpation of the right upper quadrant and eating anything. His had decreased oral intake throughout the day due to her pain. Has never had pain like this before. Has been having some loose stools today. No measured fevers. Currently rates the pain as 9 out of 10 in severity.   Past Medical History:  Diagnosis Date  . GERD (gastroesophageal reflux disease)   . Heart murmur   . Hiatal hernia   . Hyperlipidemia   . Hypertension    On Hydrochlorothiazide   Family History  Problem Relation Age of Onset  . Diabetes Mother   . Hypertension Mother   . Hyperlipidemia Mother   . Heart disease Mother     CABG X 4  . Asthma Mother   . Sarcoidosis Mother     In remission  . Heart attack Mother   . Diabetes Maternal Grandmother   . Heart disease Maternal Grandmother   . Hyperlipidemia Maternal Grandmother    Past Surgical History:  Procedure Laterality Date  . BREAST SURGERY  1986   I&D for 'milk duct'  . SALPINGOOPHORECTOMY Left 2000   Patient Active Problem List   Diagnosis Date Noted  . PVC (premature ventricular contraction) 02/12/2016  . Essential hypertension, benign 02/12/2016  . Tobacco use disorder 02/12/2016  . Migraine headache 01/09/2016  . Lymphocytosis 12/21/2015  . Hypertension 12/12/2015  . Hyperlipidemia 12/12/2015  . Vitamin D deficiency 12/12/2015  . Cardiac murmur 12/12/2015  . Hypertensive urgency 11/29/2015  . Abdominal pulsatile mass 11/29/2015      Prior to Admission medications     Medication Sig Start Date End Date Taking? Authorizing Provider  albuterol (PROVENTIL HFA;VENTOLIN HFA) 108 (90 Base) MCG/ACT inhaler Inhale 2 puffs into the lungs every 6 (six) hours as needed for wheezing or shortness of breath. 01/11/16   Lavonia Drafts, MD  atorvastatin (LIPITOR) 20 MG tablet Take 1 tablet (20 mg total) by mouth daily. 01/16/16   Roselee Nova, MD  losartan (COZAAR) 50 MG tablet Take 0.5 tablets (25 mg total) by mouth daily. 02/12/16   Wende Bushy, MD  metoprolol succinate (TOPROL XL) 25 MG 24 hr tablet Take 1 tablet (25 mg total) by mouth daily. 02/12/16   Wende Bushy, MD  polyethylene glycol [facility]) packet Take 17 g by mouth daily. 11/11/16   Loney Hering, MD  promethazine (PHENERGAN) 12.5 MG tablet Take 1 tablet (12.5 mg total) by mouth every 6 (six) hours as needed for nausea or vomiting. 02/16/17   Merlyn Lot, MD  varenicline (CHANTIX CONTINUING MONTH PAK) 1 MG tablet Take 1 tablet (1 mg total) by mouth 2 (two) times daily. 02/12/16   Wende Bushy, MD  varenicline (CHANTIX STARTING MONTH PAK) 0.5 MG X 11 & 1 MG X 42 tablet Take one 0.5 mg tablet by mouth once daily for 3 days, then increase to one 0.5 mg tablet twice daily for 4 days, then increase to one 1 mg tablet twice daily. 02/12/16   Wende Bushy, MD  Vitamin D,  Ergocalciferol, (DRISDOL) 50000 units CAPS capsule Take 1 capsule (50,000 Units total) by mouth every 7 (seven) days. 12/12/15   Roselee Nova, MD    Allergies Strawberry extract    Social History Social History  Substance Use Topics  . Smoking status: Current Every Day Smoker    Packs/day: 0.25    Years: 38.00    Types: Cigarettes  . Smokeless tobacco: Never Used  . Alcohol use Yes     Comment: occassional    Review of Systems Patient denies headaches, rhinorrhea, blurry vision, numbness, shortness of breath, chest pain, edema, cough, abdominal pain, nausea, vomiting, diarrhea, dysuria, fevers, rashes or hallucinations unless  otherwise stated above in HPI. ____________________________________________   PHYSICAL EXAM:  VITAL SIGNS: Vitals:   02/16/17 1558  BP: (!) 154/107  Pulse: 96  Resp: 20  Temp: 98.2 F (36.8 C)    Constitutional: Alert and oriented.  in no acute distress. Eyes: Conjunctivae are normal. PERRL. EOMI. Head: Atraumatic. Nose: No congestion/rhinnorhea. Mouth/Throat: Mucous membranes are moist.  Oropharynx non-erythematous. Neck: No stridor. Painless ROM. No cervical spine tenderness to palpation Hematological/Lymphatic/Immunilogical: No cervical lymphadenopathy. Cardiovascular: Normal rate, regular rhythm. Grossly normal heart sounds.  Good peripheral circulation. Respiratory: Normal respiratory effort.  No retractions. Lungs CTAB. Gastrointestinal: Soft with ttp of RUQ. No distention. No abdominal bruits. No CVA tenderness. Musculoskeletal: No lower extremity tenderness nor edema.  No joint effusions. Neurologic:  Normal speech and language. No gross focal neurologic deficits are appreciated. No gait instability. Skin:  Skin is warm, dry and intact. No rash noted. Psychiatric: Mood and affect are normal. Speech and behavior are normal. ____________________________________________   LABS (all labs ordered are listed, but only abnormal results are displayed)  Results for orders placed or performed during the hospital encounter of 02/16/17 (from the past 24 hour(s))  Lipase, blood     Status: None   Collection Time: 02/16/17  3:55 PM  Result Value Ref Range   Lipase 21 11 - 51 U/L  Comprehensive metabolic panel     Status: Abnormal   Collection Time: 02/16/17  3:55 PM  Result Value Ref Range   Sodium 138 135 - 145 mmol/L   Potassium 3.9 3.5 - 5.1 mmol/L   Chloride 105 101 - 111 mmol/L   CO2 21 (L) 22 - 32 mmol/L   Glucose, Bld 125 (H) 65 - 99 mg/dL   BUN 11 6 - 20 mg/dL   Creatinine, Ser 0.60 0.44 - 1.00 mg/dL   Calcium 9.9 8.9 - 10.3 mg/dL   Total Protein 8.6 (H) 6.5 - 8.1  g/dL   Albumin 4.6 3.5 - 5.0 g/dL   AST 19 15 - 41 U/L   ALT 13 (L) 14 - 54 U/L   Alkaline Phosphatase 93 38 - 126 U/L   Total Bilirubin 0.9 0.3 - 1.2 mg/dL   GFR calc non Af Amer >60 >60 mL/min   GFR calc Af Amer >60 >60 mL/min   Anion gap 12 5 - 15  CBC     Status: Abnormal   Collection Time: 02/16/17  3:55 PM  Result Value Ref Range   WBC 19.0 (H) 3.6 - 11.0 K/uL   RBC 4.95 3.80 - 5.20 MIL/uL   Hemoglobin 13.5 12.0 - 16.0 g/dL   HCT 40.9 35.0 - 47.0 %   MCV 82.6 80.0 - 100.0 fL   MCH 27.4 26.0 - 34.0 pg   MCHC 33.1 32.0 - 36.0 g/dL   RDW 16.5 (H) 11.5 -  14.5 %   Platelets 461 (H) 150 - 440 K/uL  Urinalysis, Complete w Microscopic     Status: Abnormal   Collection Time: 02/16/17  3:55 PM  Result Value Ref Range   Color, Urine YELLOW (A) YELLOW   APPearance HAZY (A) CLEAR   Specific Gravity, Urine 1.028 1.005 - 1.030   pH 5.0 5.0 - 8.0   Glucose, UA NEGATIVE NEGATIVE mg/dL   Hgb urine dipstick SMALL (A) NEGATIVE   Bilirubin Urine NEGATIVE NEGATIVE   Ketones, ur NEGATIVE NEGATIVE mg/dL   Protein, ur 30 (A) NEGATIVE mg/dL   Nitrite NEGATIVE NEGATIVE   Leukocytes, UA NEGATIVE NEGATIVE   RBC / HPF 0-5 0 - 5 RBC/hpf   WBC, UA 0-5 0 - 5 WBC/hpf   Bacteria, UA NONE SEEN NONE SEEN   Squamous Epithelial / LPF 0-5 (A) NONE SEEN   Mucous PRESENT    ____________________________________________   ____________________________________________  RADIOLOGY  I personally reviewed all radiographic images ordered to evaluate for the above acute complaints and reviewed radiology reports and findings.  These findings were personally discussed with the patient.  Please see medical record for radiology report.  ____________________________________________   PROCEDURES  Procedure(s) performed:  Procedures    Critical Care performed: no ____________________________________________   INITIAL IMPRESSION / ASSESSMENT AND PLAN / ED COURSE  Pertinent labs & imaging results that  were available during my care of the patient were reviewed by me and considered in my medical decision making (see chart for details).  DDX: choleithiasis, cholecystitis, enteritis, gastritis, pancreatitis  Alajah M Filippone is a 51 y.o. who presents to the ED with Nausea vomiting diarrhea and right upper quadrant abdominal pain as described above. She is afebrile and otherwise hemodynamic stable. Symptoms started after eating coffee this morning. Based on her pain will order ultrasound evaluate for evidence of acute biliary pathology. She has no right lower quadrant tenderness to palpation or suggestion of acute surgical process.  The patient will be placed on continuous pulse oximetry and telemetry for monitoring.  Laboratory evaluation will be sent to evaluate for the above complaints.     Clinical Course as of Feb 17 2124  Mon Feb 16, 2017  2122 Patient was able to tolerate PO and was able to ambulate with a steady gait.  REpeat abdominal exam is soft and benign.  At this point I do suspect some component of viral enteritis.  POssible food poisoning.  Patient stable for DC home with close follow up.  Discussed signs and symptoms for which patient should return.    [PR]    Clinical Course User Index [PR] Merlyn Lot, MD     ____________________________________________   FINAL CLINICAL IMPRESSION(S) / ED DIAGNOSES  Final diagnoses:  RUQ pain  Right upper quadrant abdominal pain  Non-intractable vomiting with nausea, unspecified vomiting type      NEW MEDICATIONS STARTED DURING THIS VISIT:  New Prescriptions   PROMETHAZINE (PHENERGAN) 12.5 MG TABLET    Take 1 tablet (12.5 mg total) by mouth every 6 (six) hours as needed for nausea or vomiting.     Note:  This document was prepared using Dragon voice recognition software and may include unintentional dictation errors.    Merlyn Lot, MD 02/16/17 2125

## 2017-02-16 NOTE — Discharge Instructions (Signed)

## 2017-02-16 NOTE — ED Triage Notes (Signed)
Pt with abd pain, n/v/d started this am after she drank some coffee.

## 2017-05-14 ENCOUNTER — Emergency Department
Admission: EM | Admit: 2017-05-14 | Discharge: 2017-05-14 | Disposition: A | Discharge status: Home / Self Care | Payer: Self-pay | Attending: Student in an Organized Health Care Education/Training Program | Admitting: Student in an Organized Health Care Education/Training Program

## 2017-05-14 ENCOUNTER — Emergency Department
Admission: EM | Admit: 2017-05-14 | Discharge: 2017-05-14 | Disposition: A | Discharge status: Home / Self Care | Payer: Self-pay | Attending: Emergency Medicine | Admitting: Emergency Medicine

## 2017-05-14 ENCOUNTER — Encounter: Payer: Self-pay | Admitting: *Deleted

## 2017-05-14 ENCOUNTER — Encounter: Payer: Self-pay | Admitting: Emergency Medicine

## 2017-05-14 ENCOUNTER — Telehealth: Payer: Self-pay | Admitting: Emergency Medicine

## 2017-05-14 DIAGNOSIS — I10 Essential (primary) hypertension: Secondary | ICD-10-CM | POA: Insufficient documentation

## 2017-05-14 DIAGNOSIS — G43009 Migraine without aura, not intractable, without status migrainosus: Secondary | ICD-10-CM | POA: Insufficient documentation

## 2017-05-14 DIAGNOSIS — F17210 Nicotine dependence, cigarettes, uncomplicated: Secondary | ICD-10-CM | POA: Insufficient documentation

## 2017-05-14 DIAGNOSIS — R51 Headache: Secondary | ICD-10-CM

## 2017-05-14 DIAGNOSIS — R519 Headache, unspecified: Secondary | ICD-10-CM

## 2017-05-14 DIAGNOSIS — Z79899 Other long term (current) drug therapy: Secondary | ICD-10-CM | POA: Insufficient documentation

## 2017-05-14 DIAGNOSIS — Z5321 Procedure and treatment not carried out due to patient leaving prior to being seen by health care provider: Secondary | ICD-10-CM | POA: Insufficient documentation

## 2017-05-14 MED ORDER — ACETAMINOPHEN 500 MG PO TABS
1000.0000 mg | ORAL_TABLET | Freq: Once | ORAL | Status: AC
  Administered 2017-05-14: 1000 mg via ORAL
  Filled 2017-05-14: qty 2

## 2017-05-14 MED ORDER — PROCHLORPERAZINE EDISYLATE 5 MG/ML IJ SOLN
10.0000 mg | Freq: Once | INTRAMUSCULAR | Status: AC
  Administered 2017-05-14: 10 mg via INTRAMUSCULAR
  Filled 2017-05-14 (×2): qty 2

## 2017-05-14 MED ORDER — AMLODIPINE BESYLATE 5 MG PO TABS
5.0000 mg | ORAL_TABLET | Freq: Once | ORAL | Status: AC
  Administered 2017-05-14: 5 mg via ORAL
  Filled 2017-05-14: qty 1

## 2017-05-14 MED ORDER — AMLODIPINE BESYLATE 5 MG PO TABS
5.0000 mg | ORAL_TABLET | Freq: Once | ORAL | Status: AC
  Administered 2017-05-14: 5 mg via ORAL

## 2017-05-14 MED ORDER — METOPROLOL TARTRATE 25 MG PO TABS
25.0000 mg | ORAL_TABLET | Freq: Once | ORAL | Status: AC
  Administered 2017-05-14: 25 mg via ORAL
  Filled 2017-05-14: qty 1

## 2017-05-14 MED ORDER — ACETAMINOPHEN 500 MG PO TABS
1000.0000 mg | ORAL_TABLET | Freq: Once | ORAL | Status: AC
  Administered 2017-05-14: 1000 mg via ORAL
  Filled 2017-05-14 (×2): qty 2

## 2017-05-14 MED ORDER — DEXAMETHASONE 4 MG PO TABS
6.0000 mg | ORAL_TABLET | Freq: Once | ORAL | Status: AC
  Administered 2017-05-14: 6 mg via ORAL
  Filled 2017-05-14 (×2): qty 1.5

## 2017-05-14 MED ORDER — AMLODIPINE BESYLATE 5 MG PO TABS
ORAL_TABLET | ORAL | Status: AC
  Filled 2017-05-14: qty 1

## 2017-05-14 MED ORDER — LOSARTAN POTASSIUM 50 MG PO TABS
25.0000 mg | ORAL_TABLET | Freq: Once | ORAL | Status: AC
  Administered 2017-05-14: 25 mg via ORAL
  Filled 2017-05-14: qty 1

## 2017-05-14 NOTE — Telephone Encounter (Signed)
Called patient due to lwot to inquire about condition and follow up plans. Patient says she was on bp meds but no longer has insurance.  Says she did buy a bp monitor last night.  Continues with headache.  She is at work now.  I explained options of [facility] and [facility] for care.  I told her that unless they were going to see her within next few days she should return here so we can get her back on her meds.  I gave her numbers for clinics.

## 2017-05-14 NOTE — ED Triage Notes (Signed)
Pt reports has been without blood pressure medication for over one month due to lack of insurance. Pt reports her blood pressure has been elevated. Pt reports has had a headache for four days. Pt alert and oriented in triage. No apparent distress noted.

## 2017-05-14 NOTE — ED Notes (Signed)
MD aware of BP prior to discharge.

## 2017-05-14 NOTE — Discharge Instructions (Signed)
Be sure to follow up with her new primary care appointment first thing in the morning to restart her blood pressure medications. Return for any worsening symptoms, discomfort, chest pain, shortness of breath or for any other concerns.   As discussed in the emergency department, you may use Tylenol and/or Ibuprofen for headaches. These are  "Over the Counter" medications and can be found at most drug stores and grocery stores. Please use the recommended dosing instructions on the bottle/box. Do not exceed the maximum dose for either medications. Please be sure to rest and drink plenty of fluids. Please be sure to call your PCP for a follow-up visit, especially if your headaches persist.  Please call your physician or return to ED if you have: 1. Worsening or change in headaches. 2. Changes in vision. 3. New-onset nausea and vomiting. 4. Numbness, tingling, weakness in your extremities,. 4. Inability to eat or drink adequate amounts of food or liquids. 5. Chest pain, shortness of breath, or difficulty breathing. 6. Neurological changes- dizziness, fainting, loss of function of your arms, legs or other parts of your body. 7. Uncontrolled hypertension. 8. Or any other emergent concerns.

## 2017-05-14 NOTE — ED Triage Notes (Signed)
Pt has a headache for 4 days.  Pt taking aleve for headache.  Pt out of blood pressure meds for several months.  Pt alert.  Speech clear.   cig smoker.

## 2017-05-14 NOTE — ED Provider Notes (Signed)
[facility] Emergency Department Provider Note    First MD Initiated Contact with Patient 05/14/17 2120     (approximate)  I have reviewed the triage vital signs and the nursing notes.   HISTORY  Chief Complaint Headache    HPI Heather Mahoney is a 51 y.o. female history of hypertension as well as migraine headaches presents with headache for the past 4 days. Patient states been out of her antihypertensive medications for several months she does not have insurance. She is actually here at this facility earlier this morning but had to leave before being seen to make it to work. She went to work was still having persistent headache and after discussion with her coworkers she was allowed to leave to return to the ER. Patient denies any chest pain or shortness of breath. She's not having any numbness or tingling. States that this feels similar to previous headaches but has been longer in duration. Patient is concerned about her elevated blood pressure and has arranged for close follow-up with [facility] at 8:00 tomorrow morning to restart her on her antihypertensive medications. States that she's been unsuccessful managing her headache at home. Denies any fevers. No congestion. No blurry vision. Does endorse photophobia and phonophobia.   Past Medical History:  Diagnosis Date  . GERD (gastroesophageal reflux disease)   . Heart murmur   . Hiatal hernia   . Hyperlipidemia   . Hypertension    On Hydrochlorothiazide   Family History  Problem Relation Age of Onset  . Diabetes Mother   . Hypertension Mother   . Hyperlipidemia Mother   . Heart disease Mother        CABG X 4  . Asthma Mother   . Sarcoidosis Mother        In remission  . Heart attack Mother   . Diabetes Maternal Grandmother   . Heart disease Maternal Grandmother   . Hyperlipidemia Maternal Grandmother    Past Surgical History:  Procedure Laterality Date  . BREAST SURGERY   1986   I&D for 'milk duct'  . SALPINGOOPHORECTOMY Left 2000   Patient Active Problem List   Diagnosis Date Noted  . PVC (premature ventricular contraction) 02/12/2016  . Essential hypertension, benign 02/12/2016  . Tobacco use disorder 02/12/2016  . Migraine headache 01/09/2016  . Lymphocytosis 12/21/2015  . Hypertension 12/12/2015  . Hyperlipidemia 12/12/2015  . Vitamin D deficiency 12/12/2015  . Cardiac murmur 12/12/2015  . Hypertensive urgency 11/29/2015  . Abdominal pulsatile mass 11/29/2015      Prior to Admission medications   Medication Sig Start Date End Date Taking? Authorizing Provider  albuterol (PROVENTIL HFA;VENTOLIN HFA) 108 (90 Base) MCG/ACT inhaler Inhale 2 puffs into the lungs every 6 (six) hours as needed for wheezing or shortness of breath. 01/11/16   Lavonia Drafts, MD  atorvastatin (LIPITOR) 20 MG tablet Take 1 tablet (20 mg total) by mouth daily. 01/16/16   Roselee Nova, MD  losartan (COZAAR) 50 MG tablet Take 0.5 tablets (25 mg total) by mouth daily. 02/12/16   Wende Bushy, MD  metoprolol succinate (TOPROL XL) 25 MG 24 hr tablet Take 1 tablet (25 mg total) by mouth daily. 02/12/16   Wende Bushy, MD  polyethylene glycol (MIRALAX) packet Take 17 g by mouth daily. 11/11/16   Loney Hering, MD  promethazine (PHENERGAN) 12.5 MG tablet Take 1 tablet (12.5 mg total) by mouth every 6 (six) hours as needed for nausea or vomiting. 02/16/17  Merlyn Lot, MD  varenicline (CHANTIX CONTINUING MONTH PAK) 1 MG tablet Take 1 tablet (1 mg total) by mouth 2 (two) times daily. 02/12/16   Wende Bushy, MD  varenicline (CHANTIX STARTING MONTH PAK) 0.5 MG X 11 & 1 MG X 42 tablet Take one 0.5 mg tablet by mouth once daily for 3 days, then increase to one 0.5 mg tablet twice daily for 4 days, then increase to one 1 mg tablet twice daily. 02/12/16   Wende Bushy, MD  Vitamin D, Ergocalciferol, (DRISDOL) 50000 units CAPS capsule Take 1 capsule (50,000 Units total) by mouth  every 7 (seven) days. 12/12/15   Roselee Nova, MD    Allergies Strawberry extract    Social History Social History  Substance Use Topics  . Smoking status: Current Every Day Smoker    Packs/day: 0.25    Years: 38.00    Types: Cigarettes  . Smokeless tobacco: Never Used  . Alcohol use Yes     Comment: occassional    Review of Systems Patient denies headaches, rhinorrhea, blurry vision, numbness, shortness of breath, chest pain, edema, cough, abdominal pain, nausea, vomiting, diarrhea, dysuria, fevers, rashes or hallucinations unless otherwise stated above in HPI. ____________________________________________   PHYSICAL EXAM:  VITAL SIGNS: Vitals:   05/14/17 2215 05/14/17 2218  BP: (!) 208/104 (!) 208/104  Pulse:  83  Resp:    Temp:      Constitutional: Alert and oriented. Well appearing and in no acute distress. Eyes: Conjunctivae are normal.  Head: Atraumatic. Nose: No congestion/rhinnorhea. Mouth/Throat: Mucous membranes are moist.   Neck: No stridor. Painless ROM.  Cardiovascular: Normal rate, regular rhythm. Grossly normal heart sounds.  Good peripheral circulation. Respiratory: Normal respiratory effort.  No retractions. Lungs CTAB. Gastrointestinal: Soft and nontender. No distention. No abdominal bruits. No CVA tenderness. Genitourinary:  Musculoskeletal: No lower extremity tenderness nor edema.  No joint effusions. Neurologic:  Normal speech and language. No gross focal neurologic deficits are appreciated. No facial droop Skin:  Skin is warm, dry and intact. No rash noted. Psychiatric: Mood and affect are normal. Speech and behavior are normal.  ____________________________________________   LABS (all labs ordered are listed, but only abnormal results are displayed)  No results found for this or any previous visit (from the past 24  hour(s)). ____________________________________________  ____________________________________________  [id number]   ____________________________________________   PROCEDURES  Procedure(s) performed:  Procedures    Critical Care performed: no ____________________________________________   INITIAL IMPRESSION / ASSESSMENT AND PLAN / ED COURSE  Pertinent labs & imaging results that were available during my care of the patient were reviewed by me and considered in my medical decision making (see chart for details).  DDX: tension, hypertensive emergency, migraine, unlikely sah, ich  Heather Mahoney is a 51 y.o. who presents to the ED with with Hx of migranes p/w HA for last 4 days. Also concerned for elevated BP. Not worst HA ever. Gradual onset. HA similar to previous episodes. Denies focal neurologic symptoms. Denies trauma. No fevers or neck pain. No vision loss. Afebrile in ED. VSS. Exam as above. No meningeal signs. No CN, motor, sensory or cerebellar deficits. Temporal arteries palpable and non-tender. Appears well and non-toxic.   Likely tension, non-specific or possible migraine HA. Clinical picture is not consistent with ICH, SAH, SDH, EDH, TIA, or CVA. No concern for meningitis or encephalitis. No concern for GCA/Temporal arteritis.  Will provide IM compazine, tylenol and decadron as patient declining IV medications.  Will give norvasc.   -----------------------------------------  10:42 PM on 05/14/2017 -----------------------------------------   Pain improved.  Patient states headache is now completely gone. Repeat neuro exam is again without focal deficit, nuchal rigidity or evidence of meningeal irritation.  Stable to D/C home, follow up with PCP or Neurology if persistent recurrent Has.  Have discussed with the patient and available family all diagnostics and treatments performed thus far and all questions were answered to the best of my ability. The patient demonstrates  understanding and agreement with plan.        ____________________________________________   FINAL CLINICAL IMPRESSION(S) / ED DIAGNOSES  Final diagnoses:  Acute nonintractable headache, unspecified headache type  Hypertension, unspecified type      NEW MEDICATIONS STARTED DURING THIS VISIT:  New Prescriptions   No medications on file     Note:  This document was prepared using Dragon voice recognition software and may include unintentional dictation errors.    Merlyn Lot, MD 05/14/17 2242

## 2017-09-29 IMAGING — US US PELVIS COMPLETE
1 series · 14 of 25 positions shown · non-contrast
Comparison: CT abdomen and pelvis 04/08/2011

CLINICAL DATA: Abdominal pain with vaginal discharge. Previous left
oophorectomy. Patient is postmenopausal.

EXAM:
TRANSABDOMINAL AND TRANSVAGINAL ULTRASOUND OF PELVIS
TECHNIQUE: Both transabdominal and transvaginal ultrasound examinations of the
pelvis were performed. Transabdominal technique was performed for
global imaging of the pelvis including uterus, ovaries, adnexal
regions, and pelvic cul-de-sac. It was necessary to proceed with
endovaginal exam following the transabdominal exam to visualize the
uterus and ovaries.

[Series 1: us pelvis complete · 63 acquisitions, 14 frames shown]
[im 1/63]
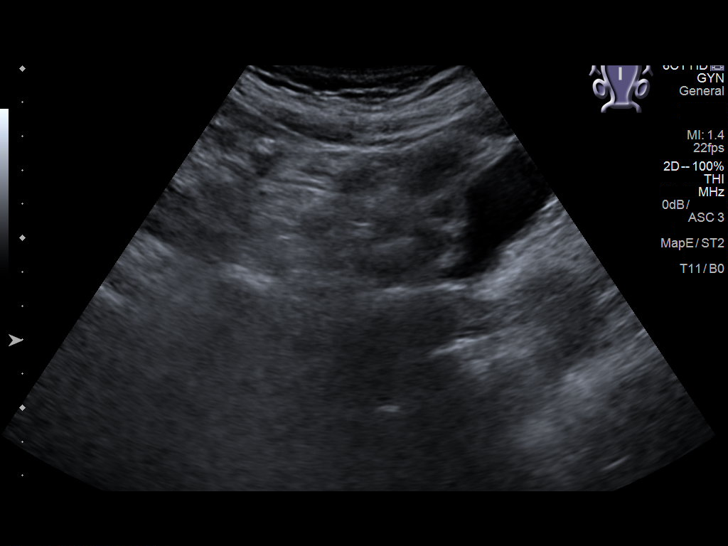
[im 6/63]
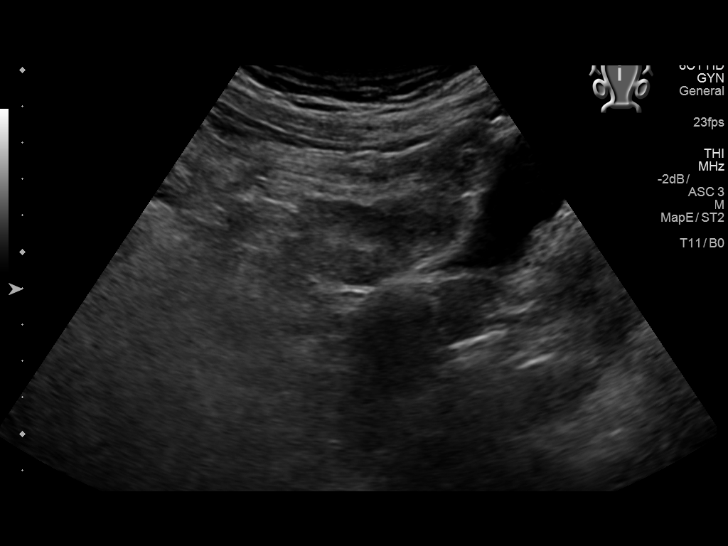
[im 11/63]
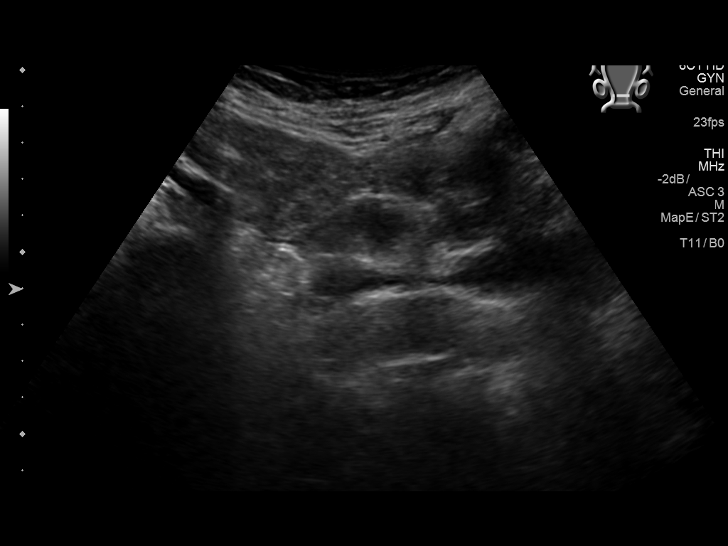
[im 16/63]
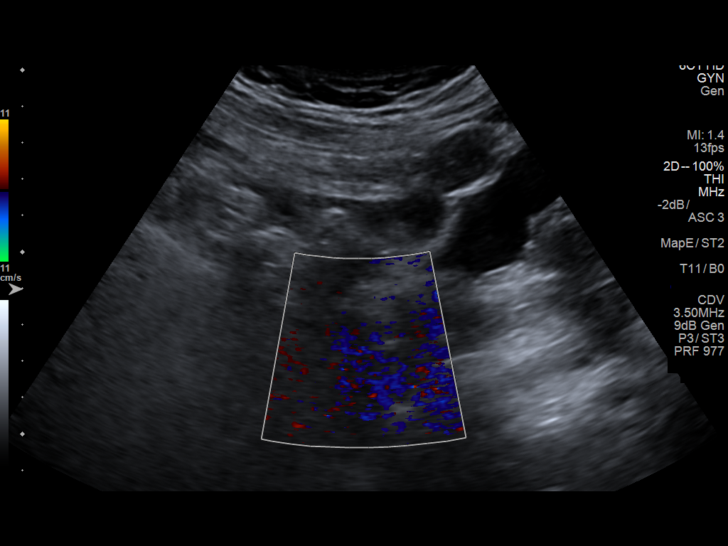
[im 21/63]
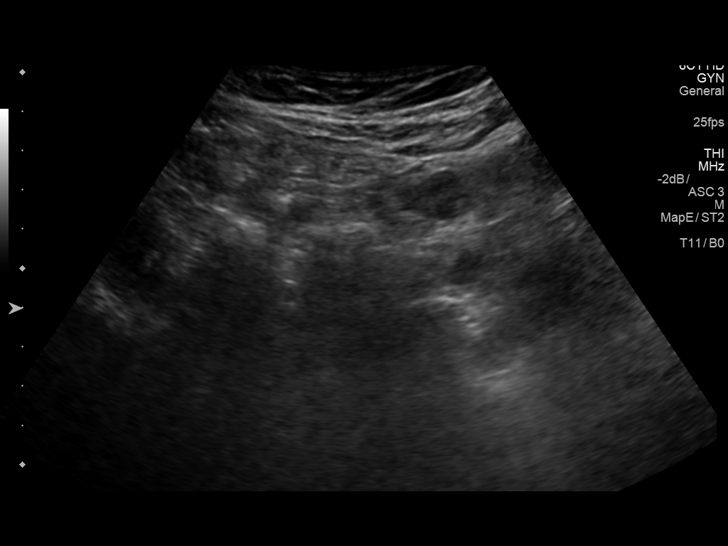
[im 24/63]
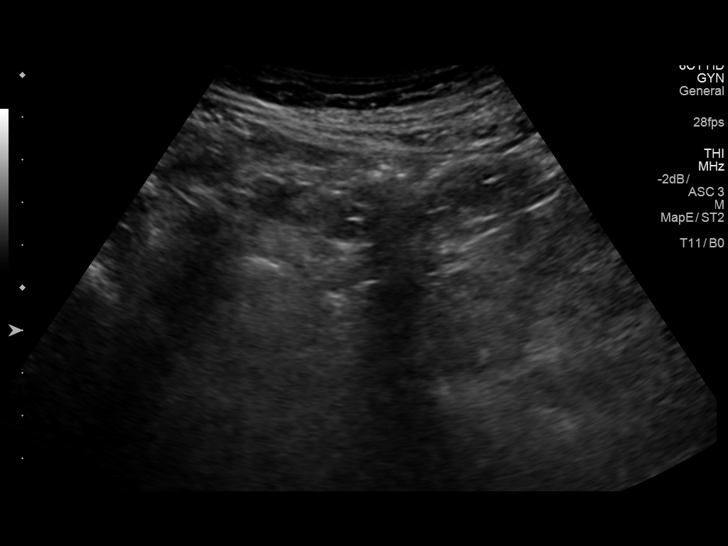
[im 29/63]
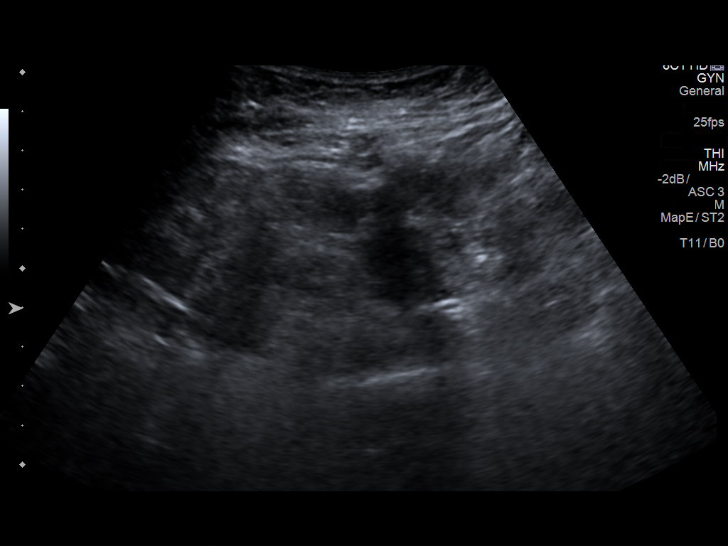
[im 34/63]
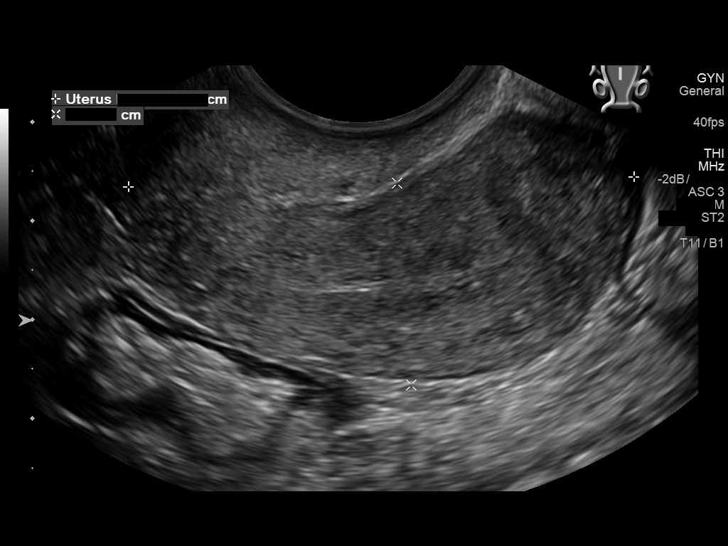
[im 39/63]
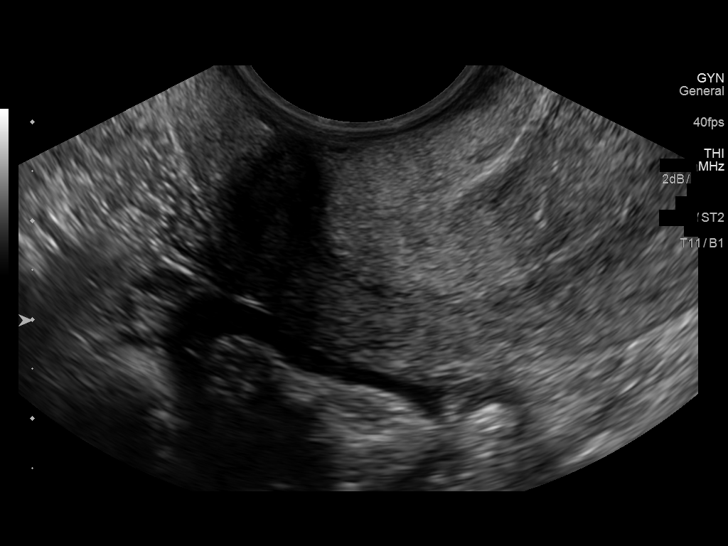
[im 42/63]
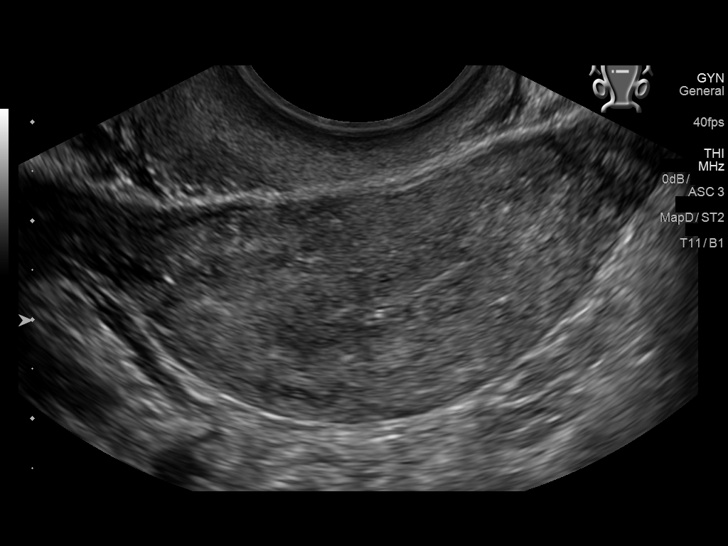
[im 47/63]
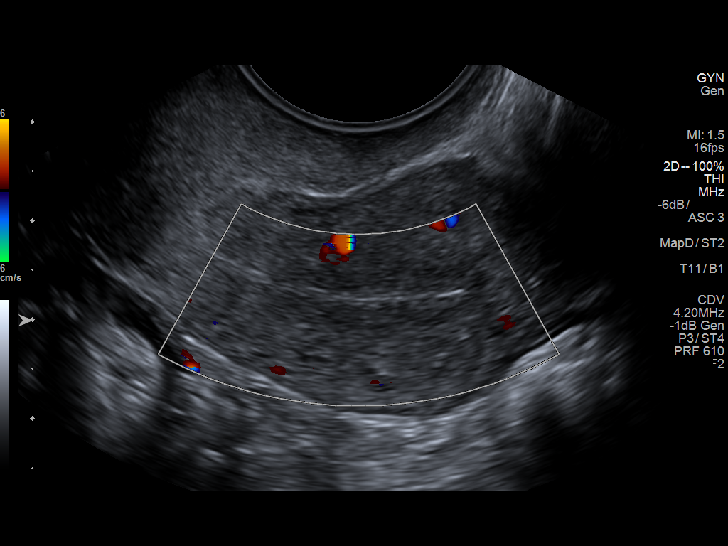
[im 52/63]
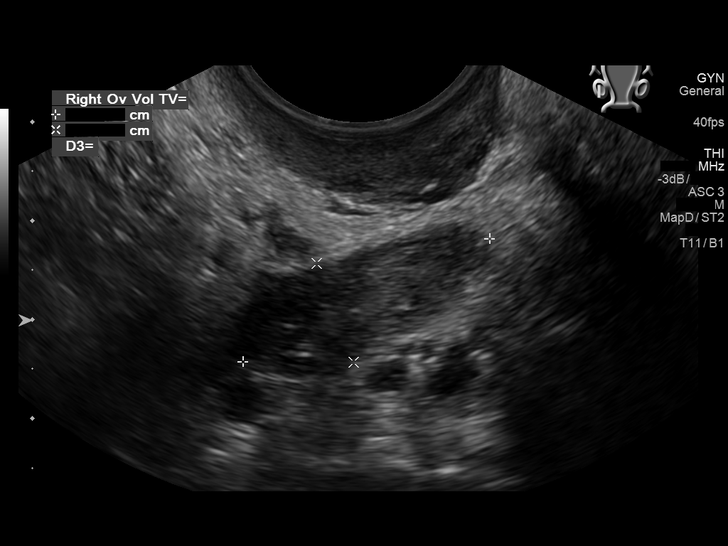
[im 57/63]
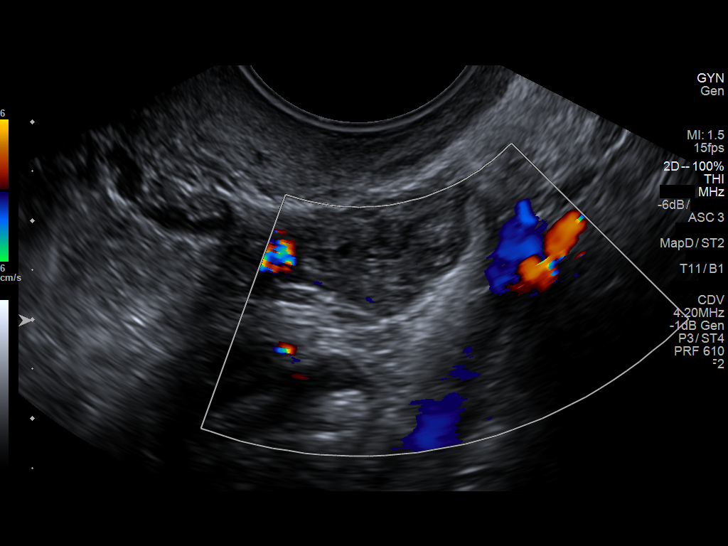
[im 63/63]
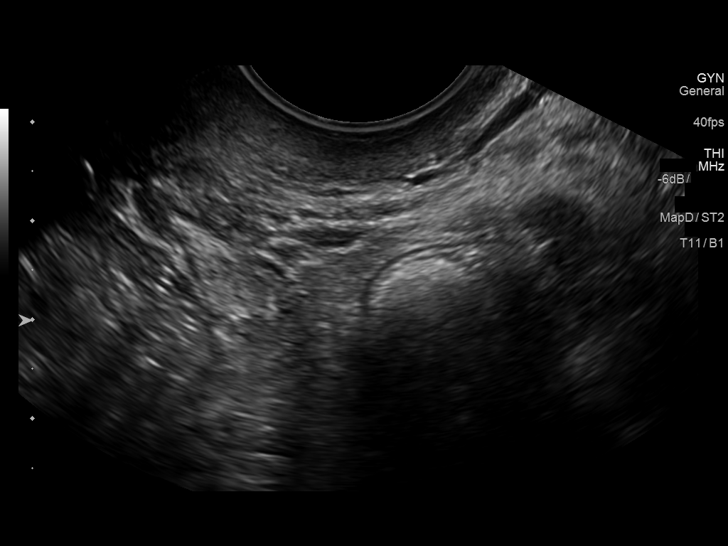

[14 of 25 positions shown; findings below may reference images not displayed]

FINDINGS: Uterus

Measurements: 5.8 x 2.6 x 4.2 cm. Uterus is retroverted. No fibroids
or other mass visualized.

Endometrium

Thickness: 2.2 mm.  No focal abnormality visualized.

Right ovary

Measurements: 2.8 x 1 x 1.8 cm. Normal appearance/no adnexal mass.

Left ovary

Left ovary is surgically absent.  No abnormal adnexal masses seen.

Other findings

No abnormal free fluid.
IMPRESSION: Normal ultrasound appearance of the uterus and right ovary. Surgical
absence of left ovary.

## 2017-11-04 ENCOUNTER — Encounter: Payer: Self-pay | Admitting: Emergency Medicine

## 2017-11-04 ENCOUNTER — Emergency Department: Payer: Self-pay

## 2017-11-04 ENCOUNTER — Other Ambulatory Visit: Payer: Self-pay

## 2017-11-04 DIAGNOSIS — F17210 Nicotine dependence, cigarettes, uncomplicated: Secondary | ICD-10-CM | POA: Insufficient documentation

## 2017-11-04 DIAGNOSIS — R51 Headache: Secondary | ICD-10-CM | POA: Insufficient documentation

## 2017-11-04 DIAGNOSIS — I10 Essential (primary) hypertension: Secondary | ICD-10-CM | POA: Insufficient documentation

## 2017-11-04 DIAGNOSIS — R0789 Other chest pain: Secondary | ICD-10-CM | POA: Insufficient documentation

## 2017-11-04 DIAGNOSIS — Z79899 Other long term (current) drug therapy: Secondary | ICD-10-CM | POA: Insufficient documentation

## 2017-11-04 LAB — BASIC METABOLIC PANEL WITH GFR
Anion gap: 11 (ref 5–15)
BUN: 14 mg/dL (ref 6–20)
CO2: 24 mmol/L (ref 22–32)
Calcium: 9.1 mg/dL (ref 8.9–10.3)
Chloride: 103 mmol/L (ref 101–111)
Creatinine, Ser: 0.72 mg/dL (ref 0.44–1.00)
GFR calc Af Amer: >60 mL/min
GFR calc non Af Amer: >60 mL/min
Glucose, Bld: 96 mg/dL (ref 65–99)
Potassium: 3.3 mmol/L — ABNORMAL LOW (ref 3.5–5.1)
Sodium: 138 mmol/L (ref 135–145)

## 2017-11-04 LAB — CBC
HEMATOCRIT: 35.4 % (ref 35.0–47.0)
HEMOGLOBIN: 12.3 g/dL (ref 12.0–16.0)
MCH: 28.6 pg (ref 26.0–34.0)
MCHC: 34.9 g/dL (ref 32.0–36.0)
MCV: 82.1 fL (ref 80.0–100.0)
Platelets: 427 10*3/uL (ref 150–440)
RBC: 4.31 MIL/uL (ref 3.80–5.20)
RDW: 16.3 % — ABNORMAL HIGH (ref 11.5–14.5)
WBC: 15.5 10*3/uL — ABNORMAL HIGH (ref 3.6–11.0)

## 2017-11-04 LAB — TROPONIN I: Troponin I: <0.03 ng/mL

## 2017-11-04 NOTE — ED Triage Notes (Signed)
Patient ambulatory to triage with steady gait, without difficulty or distress noted; pt st mid CP x 4 days with no accomp symptoms; st hx of same with HTN

## 2017-11-05 ENCOUNTER — Emergency Department
Admission: EM | Admit: 2017-11-05 | Discharge: 2017-11-05 | Disposition: A | Discharge status: Home / Self Care | Payer: Self-pay | Attending: Emergency Medicine | Admitting: Emergency Medicine

## 2017-11-05 DIAGNOSIS — R519 Headache, unspecified: Secondary | ICD-10-CM

## 2017-11-05 DIAGNOSIS — R51 Headache: Secondary | ICD-10-CM

## 2017-11-05 DIAGNOSIS — R0789 Other chest pain: Secondary | ICD-10-CM

## 2017-11-05 LAB — TROPONIN I

## 2017-11-05 MED ORDER — DIPHENHYDRAMINE HCL 50 MG/ML IJ SOLN
12.5000 mg | INTRAMUSCULAR | Status: AC
  Administered 2017-11-05: 12.5 mg via INTRAVENOUS
  Filled 2017-11-05: qty 1

## 2017-11-05 MED ORDER — SODIUM CHLORIDE 0.9 % IV BOLUS (SEPSIS)
500.0000 mL | INTRAVENOUS | Status: AC
  Administered 2017-11-05: 500 mL via INTRAVENOUS

## 2017-11-05 MED ORDER — KETOROLAC TROMETHAMINE 30 MG/ML IJ SOLN
15.0000 mg | Freq: Once | INTRAMUSCULAR | Status: AC
  Administered 2017-11-05: 15 mg via INTRAVENOUS
  Filled 2017-11-05: qty 1

## 2017-11-05 MED ORDER — ASPIRIN 81 MG PO CHEW
324.0000 mg | CHEWABLE_TABLET | Freq: Once | ORAL | Status: AC
  Administered 2017-11-05: 324 mg via ORAL
  Filled 2017-11-05: qty 4

## 2017-11-05 MED ORDER — DEXAMETHASONE SODIUM PHOSPHATE 10 MG/ML IJ SOLN
10.0000 mg | Freq: Once | INTRAMUSCULAR | Status: AC
  Administered 2017-11-05: 10 mg via INTRAVENOUS
  Filled 2017-11-05: qty 1

## 2017-11-05 MED ORDER — METOCLOPRAMIDE HCL 5 MG/ML IJ SOLN
10.0000 mg | INTRAMUSCULAR | Status: AC
  Administered 2017-11-05: 10 mg via INTRAVENOUS
  Filled 2017-11-05: qty 2

## 2017-11-05 NOTE — Discharge Instructions (Signed)
As we discussed, your workup was reassuring without any evidence of acute or emergent medical condition.  It appears you are suffering from a migraine.  Your cardiac workup was also reassuring but we do suggest that you follow-up with a cardiologist for additional testing.  If you are not already taking a daily baby aspirin I encourage you to do so.  Return to the emergency department if you develop new or worsening symptoms that concern you.

## 2017-11-05 NOTE — ED Notes (Signed)
Patient discharged to home per MD order. Patient in stable condition, and deemed medically cleared by ED provider for discharge. Discharge instructions reviewed with patient/family using "Teach Back"; verbalized understanding of medication education and administration, and information about follow-up care. Denies further concerns. ° °

## 2017-11-05 NOTE — ED Provider Notes (Addendum)
[facility] Emergency Department Provider Note  ____________________________________________   First MD Initiated Contact with Patient 11/05/17 0231     (approximate)  I have reviewed the triage vital signs and the nursing notes.   HISTORY  Chief Complaint Chest Pain    HPI Heather Mahoney is a 52 y.o. female who presents for evaluation of 2 separate complaints:  1) headache.  The patient reports that she has had a global throbbing headache for about 4 days.  It started gradually and has been constant.  It gradually got worse over time.  It is worse with bright lights and loud noises.  It feels similar to prior migraines.  It is accompanied with nausea but she has not been vomiting.  She denies abdominal pain and numbness/weakness in her extremities.  She has had no vision changes.  She has no difficulty swallowing or speaking or finding words.  She has had no trauma.  2) chest pressure.  The symptoms started earlier tonight.  Nothing particular makes it better or worse.  No shortness of breath, and she states that in the past she has had sharp chest pains but never the dull pressure.  It is mild.    Past Medical History:  Diagnosis Date  . GERD (gastroesophageal reflux disease)   . Heart murmur   . Hiatal hernia   . Hyperlipidemia   . Hypertension    On Hydrochlorothiazide    Patient Active Problem List   Diagnosis Date Noted  . PVC (premature ventricular contraction) 02/12/2016  . Essential hypertension, benign 02/12/2016  . Tobacco use disorder 02/12/2016  . Migraine headache 01/09/2016  . Lymphocytosis 12/21/2015  . Hypertension 12/12/2015  . Hyperlipidemia 12/12/2015  . Vitamin D deficiency 12/12/2015  . Cardiac murmur 12/12/2015  . Hypertensive urgency 11/29/2015  . Abdominal pulsatile mass 11/29/2015    Past Surgical History:  Procedure Laterality Date  . BREAST SURGERY  1986   I&D for 'milk duct'  . SALPINGOOPHORECTOMY Left 2000      Prior to Admission medications   Medication Sig Start Date End Date Taking? Authorizing Provider  albuterol (PROVENTIL HFA;VENTOLIN HFA) 108 (90 Base) MCG/ACT inhaler Inhale 2 puffs into the lungs every 6 (six) hours as needed for wheezing or shortness of breath. 01/11/16  Yes Lavonia Drafts, MD  atorvastatin (LIPITOR) 20 MG tablet Take 1 tablet (20 mg total) by mouth daily. 01/16/16  Yes Roselee Nova, MD  losartan (COZAAR) 50 MG tablet Take 0.5 tablets (25 mg total) by mouth daily. 02/12/16  Yes Wende Bushy, MD  metoprolol succinate (TOPROL XL) 25 MG 24 hr tablet Take 1 tablet (25 mg total) by mouth daily. 02/12/16  Yes Wende Bushy, MD  naproxen sodium (ALEVE) 220 MG tablet Take 220 mg by mouth 2 (two) times daily as needed (pain).   Yes [provider]    Allergies Strawberry extract  Family History  Problem Relation Age of Onset  . Diabetes Mother   . Hypertension Mother   . Hyperlipidemia Mother   . Heart disease Mother        CABG X 4  . Asthma Mother   . Sarcoidosis Mother        In remission  . Heart attack Mother   . Diabetes Maternal Grandmother   . Heart disease Maternal Grandmother   . Hyperlipidemia Maternal Grandmother     Social History Social History   Tobacco Use  . Smoking status: Current Every Day Smoker  Packs/day: 0.25    Years: 38.00    Pack years: 9.50    Types: Cigarettes  . Smokeless tobacco: Never Used  Substance Use Topics  . Alcohol use: Yes    Comment: occassional  . Drug use: Yes    Types: Marijuana    Comment: smokes Marijuana 'when I cant sleep, maybe once or twice a week'    Review of Systems Constitutional: No fever/chills Eyes: No visual changes but HA worse with photophobia ENT: No sore throat. Cardiovascular: chest pressure as described above Respiratory: Denies shortness of breath. Gastrointestinal: No abdominal pain.  Nausea, no vomiting.  No diarrhea.  No constipation. Genitourinary: Negative for  dysuria. Musculoskeletal: Negative for neck pain.  Negative for back pain. Integumentary: Negative for rash. Neurological: Generalized global headaches.  No focal numbness/weakness   ____________________________________________   PHYSICAL EXAM:  VITAL SIGNS: ED Triage Vitals  Enc Vitals Group     BP 11/04/17 2311 (!) 187/100     Pulse Rate 11/04/17 2311 80     Resp 11/04/17 2311 18     Temp 11/04/17 2311 98.2 F (36.8 C)     Temp Source 11/04/17 2311 Oral     SpO2 11/04/17 2311 98 %     Weight 11/04/17 2309 71.2 kg (157 lb)     Height 11/04/17 2309 1.702 m (5\' 7" )     Head Circumference --      Peak Flow --      Pain Score 11/04/17 2309 4     Pain Loc --      Pain Edu? --      Excl. in [place]? --     Constitutional: Alert and oriented. Well appearing and in no acute distress. Eyes: Conjunctivae are normal. PERRL. EOMI. Head: Atraumatic. Nose: No congestion/rhinnorhea. Mouth/Throat: Mucous membranes are moist. Neck: No stridor.  No meningeal signs.   Cardiovascular: Normal rate, regular rhythm. Good peripheral circulation. Grossly normal heart sounds. Respiratory: Normal respiratory effort.  No retractions. Lungs CTAB. Gastrointestinal: Soft and nontender. No distention.  Musculoskeletal: No lower extremity tenderness nor edema. No gross deformities of extremities. Neurologic:  Normal speech and language. No gross focal neurologic deficits are appreciated.  Skin:  Skin is warm, dry and intact. No rash noted. Psychiatric: Mood and affect are normal. Speech and behavior are normal.  ____________________________________________   LABS (all labs ordered are listed, but only abnormal results are displayed)  Labs Reviewed  BASIC METABOLIC PANEL - Abnormal; Notable for the following components:      Result Value   Potassium 3.3 (*)    All other components within normal limits  CBC - Abnormal; Notable for the following components:   WBC 15.5 (*)    RDW 16.3 (*)    All  other components within normal limits  TROPONIN I  TROPONIN I   ____________________________________________  EKG  ED ECG REPORT I, Hinda Kehr, the attending physician, personally viewed and interpreted this ECG.  Date: 11/04/2017 EKG Time: 23: 21 Rate: 74 Rhythm: normal sinus rhythm QRS Axis: normal Intervals: normal ST/T Wave abnormalities: Non-specific ST segment / T-wave changes, but no evidence of acute ischemia. Narrative Interpretation: no evidence of acute ischemia   ____________________________________________  RADIOLOGY   Dg Chest 2 View  Result Date: 11/04/2017 CLINICAL DATA:  Chest pain for 4 days EXAM: CHEST  2 VIEW COMPARISON:  01/11/2016 chest x-ray FINDINGS: Mild bronchitic changes. No consolidation or effusion. Normal heart size. Aortic atherosclerosis. No pneumothorax. IMPRESSION: Mild bronchitic changes.  No focal  pulmonary infiltrate is seen. Electronically Signed   By: Donavan Foil M.D.   On: 11/04/2017 23:47    ____________________________________________   PROCEDURES  Critical Care performed: No   Procedure(s) performed:   Procedures   ____________________________________________   INITIAL IMPRESSION / ASSESSMENT AND PLAN / ED COURSE  As part of my medical decision making, I reviewed the following data within the [place] notes reviewed and incorporated, Labs reviewed , EKG interpreted , Old chart reviewed and Notes from prior ED visits    Differential diagnosis for chest pain includes, but is not limited to, ACS, aortic dissection, pulmonary embolism, cardiac tamponade, pneumothorax, pneumonia, pericarditis, myocarditis, GI-related causes including esophagitis/gastritis, and musculoskeletal chest wall pain.  For this particular patient she may be having some degree of angina but I think it may be related to some bronchitis and her smoking history.  Her chest x-ray was generally on her kidney changes and she has  a mild leukocytosis.  There is no evidence that she requires antibiotics no indication of any acute or emergent condition at this time.  I will repeat a troponin to make sure that it is unlikely she is having symptoms of ACS/NSTEMI, but she is comfortable with the plan to follow-up as an outpatient.  She remains low risk by HEART score and her Wells score for pulmonary embolism is 0.  Differential diagnosis for headache includes, but is not limited to, intracranial hemorrhage, meningitis/encephalitis, previous head trauma, cavernous venous thrombosis, tension headache, temporal arteritis, migraine or migraine equivalent, idiopathic intracranial hypertension, and non-specific headache.  Her symptoms are most consistent with a migraine.  She has had migraines in the past that felt similar.  I will treat empirically and if her pain is not relieved or significantly improved that I will consider imaging but given the absence of any focal neurological deficits or thunderclap headache or other indication of an emergent condition, I think this is appropriate treatment.  She understands and agrees with the plan.    Clinical Course as of Nov 05 553  Thu Nov 05, 2017  0430 Second troponin was negative Troponin I: <0.03 [CF]  0545 The patient has been resting comfortably.  She said her headache is almost completely gone and she feels ready to go home.  I gave my usual and customary return precautions.  She will follow-up as an outpatient or return to the ED if necessary.  [CF]  [phone] gave full dose ASA prior to discharge  [CF]    Clinical Course User Index [CF] Hinda Kehr, MD    ____________________________________________  FINAL CLINICAL IMPRESSION(S) / ED DIAGNOSES  Final diagnoses:  Nonintractable headache, unspecified chronicity pattern, unspecified headache type  Atypical chest pain     MEDICATIONS GIVEN DURING THIS VISIT:  Medications  aspirin chewable tablet 324 mg (not administered)   ketorolac (TORADOL) 30 MG/ML injection 15 mg (15 mg Intravenous Given 11/05/17 0424)  metoCLOPramide (REGLAN) injection 10 mg (10 mg Intravenous Given 11/05/17 0424)  diphenhydrAMINE (BENADRYL) injection 12.5 mg (12.5 mg Intravenous Given 11/05/17 0424)  dexamethasone (DECADRON) injection 10 mg (10 mg Intravenous Given 11/05/17 0424)  sodium chloride 0.9 % bolus 500 mL (500 mLs Intravenous New Bag/Given 11/05/17 0424)     ED Discharge Orders    None       Note:  This document was prepared using Dragon voice recognition software and may include unintentional dictation errors.    Hinda Kehr, MD 11/05/17 0865    Hinda Kehr, MD 11/05/17  8472    Hinda Kehr, MD 11/05/17 [phone]

## 2017-12-17 NOTE — Progress Notes (Signed)
Cardiology Office Note  Date:  12/18/2017   ID:  Heather Mahoney, DOB [birth year], MRN [id number]  PCP:  Patient, No Pcp Per   Chief Complaint  Patient presents with  . OTHER    Pt seen in ED due to chest pain c/o elevated BP. Meds reviewed verbally with pt.      History of Present Illness: Heather Mahoney is a 52 y.o. female  Smoker GERD HTN PVCs Fall and question of loss of consciousness 2017, secondary to orthostasis,  Who presents today for follow-up of recent episode of chest pain  Seen in the emergency room November 05, 2017 Reported to have a headache , blinking lights leading to headaches also chest pressure Blood pressure was markedly elevated 187/100 Potassium 3.3 Cardiac enzymes negative  Active at baseline with no reproducible chest pain Blood pressure has been running high at home Ran out of her Lipitor Tolerating metoprolol and losartan taking 50 mg losartan  Denies having any significant PVCs Long discussion concerning her smoking currently using half a pack per day.  Unable to afford Chantix  EKG personally reviewed by myself on todays visit Shows normal sinus rhythm rate 79 bpm nonspecific T wave abnormality V6 inferior leads III and aVF  Other past medical history reviewed Echocardiogram 02/06/2016: Left ventricle: The cavity size was normal. Systolic function was  normal. The estimated ejection fraction was in the range of 55%  to 60%. Wall motion was normal; there were no regional wall  motion abnormalities. Left ventricular diastolic function  parameters were normal. - Mitral valve: There was mild regurgitation. - Right ventricle: Systolic function was normal. - Pulmonary arteries: Systolic pressure was within the normal  range. - Pericardium, extracardiac: A trivial pericardial effusion was  identified.  Holter monitor for 02/05/2016: Overall rhythm was sinus rhythm, with average heart rate of 88 BPM. Minimal heart rate of 57 bpm at 5:24  AM 02/06/2016. Maximum heart rate of 136 BPM and 1747 02/05/2016. 10% total number of beats in tachycardia. Longest RR interval was 1.46 seconds.  No high grade supraventricular ectopy: 4 isolated PACs.   Ventricular ectopy: 14,946 isolated PVCs. 53 ventricular couplets. No ventricular runs. 12% of total number of beats were ventricular beats.  No documented atrial fibrillation.     Exercise nuclear stress test 01/23/2016:  Blood pressure demonstrated a hypertensive response to exercise.  There was no ST segment deviation noted during stress.  No T wave inversion was noted during stress.  Exercise myocardial perfusion imaging study with no significant ischemia Normal wall motion, EF estimated at 46% Depressed EF likely secondary to GI uptake artifact  No EKG changes concerning for ischemia at peak stress or in recovery. Rare PVCs noted Good exercise tolerance, 10.1 METS achieved Low risk scan   family history includes Asthma in her mother; Diabetes in her maternal grandmother and mother; Heart attack in her mother; Heart disease in her maternal grandmother and mother; Hyperlipidemia in her maternal grandmother and mother; Hypertension in her mother; Sarcoidosis in her mother.    PMH:   has a past medical history of GERD (gastroesophageal reflux disease), Heart murmur, Hiatal hernia, Hyperlipidemia, and Hypertension.  PSH:    Past Surgical History:  Procedure Laterality Date  . BREAST SURGERY  1986   I&D for 'milk duct'  . SALPINGOOPHORECTOMY Left 2000    Current Outpatient Medications  Medication Sig Dispense Refill  . albuterol (PROVENTIL HFA;VENTOLIN HFA) 108 (90 Base) MCG/ACT inhaler Inhale 2 puffs into the lungs every 6 (  six) hours as needed for wheezing or shortness of breath. 1 Inhaler 2  . atorvastatin (LIPITOR) 20 MG tablet Take 1 tablet (20 mg total) by mouth daily. 30 tablet 11  . losartan (COZAAR) 100 MG tablet Take 1 tablet (100 mg total) by mouth daily. 30  tablet 11  . metoprolol succinate (TOPROL-XL) 50 MG 24 hr tablet Take 1 tablet (50 mg total) by mouth daily. 30 tablet 11  . naproxen sodium (ALEVE) 220 MG tablet Take 220 mg by mouth 2 (two) times daily as needed (pain).     No current facility-administered medications for this visit.      Allergies:   Strawberry extract   Social History:  The patient  reports that she has been smoking cigarettes.  She has a 9.50 pack-year smoking history. she has never used smokeless tobacco. She reports that she drinks alcohol. She reports that she uses drugs. Drug: Marijuana.   Family History:   family history includes Asthma in her mother; Diabetes in her maternal grandmother and mother; Heart attack in her mother; Heart disease in her maternal grandmother and mother; Hyperlipidemia in her maternal grandmother and mother; Hypertension in her mother; Sarcoidosis in her mother.    Review of Systems: Review of Systems  Constitutional: Negative.   Eyes:       Visual disturbance  Respiratory: Negative.   Cardiovascular: Positive for chest pain.  Gastrointestinal: Negative.   Musculoskeletal: Negative.   Neurological: Positive for headaches.  Psychiatric/Behavioral: Negative.   All other systems reviewed and are negative.    PHYSICAL EXAM: VS:  BP (!) 158/90 (BP Location: Left Arm, Patient Position: Sitting, Cuff Size: Normal)   Pulse 79   Ht 5\' 7"  (1.702 m)   Wt 158 lb 4 oz (71.8 kg)   BMI 24.79 kg/m  , BMI Body mass index is 24.79 kg/m. GEN: Well nourished, well developed, in no acute distress  HEENT: normal  Neck: no JVD, carotid bruits, or masses Cardiac: RRR; no murmurs, rubs, or gallops,no edema  Respiratory:  clear to auscultation bilaterally, normal work of breathing GI: soft, nontender, nondistended, + BS MS: no deformity or atrophy  Skin: warm and dry, no rash Neuro:  Strength and sensation are intact Psych: euthymic mood, full affect   Recent Labs: 02/16/2017: ALT  13 11/04/2017: BUN 14; Creatinine, Ser 0.72; Hemoglobin 12.3; Platelets 427; Potassium 3.3; Sodium 138    Lipid Panel Lab Results  Component Value Date   CHOL 226 (H) 01/11/2016   HDL 51 01/11/2016   LDLCALC 158 (H) 01/11/2016   TRIG 85 01/11/2016      Wt Readings from Last 3 Encounters:  12/18/17 158 lb 4 oz (71.8 kg)  11/04/17 157 lb (71.2 kg)  05/14/17 150 lb (68 kg)      ASSESSMENT AND PLAN:  Syncope, unspecified syncope type - Plan: EKG 12-Lead Denies any recent near syncope or syncope  Essential hypertension, benign -  Persistently elevated blood pressure, recommended that she increase losartan up to 100 mg daily, metoprolol up to 50 daily Recently seen in the emergency room with systolic pressures of 683   Mixed hyperlipidemia - Plan: EKG 12-Lead Recommend she stay on her Lipitor,  prescription has been sent in  Tobacco use disorder - Plan: EKG 12-Lead, metoprolol succinate (TOPROL-XL) 50 MG 24 hr tablet, losartan (COZAAR) 100 MG tablet  PVC (premature ventricular contraction) - denies any significant symptoms of PVCs Continue metoprolol  Chest pain with moderate risk for cardiac etiology Atypical presentation No  further workup at this time  Visual field scotoma, unspecified laterality  Etiology unclear, details discussed with her Recently seen in the emergency room for scotoma-like symptoms She does not want referral to neurology  Disposition:   F/U as needed   Orders Placed This Encounter  Procedures  . EKG 12-Lead     Signed, Esmond Plants, M.D., Ph.D. 12/18/2017  [place], [place]

## 2017-12-18 ENCOUNTER — Encounter: Payer: Self-pay | Admitting: Cardiovascular Disease

## 2017-12-18 ENCOUNTER — Ambulatory Visit (INDEPENDENT_AMBULATORY_CARE_PROVIDER_SITE_OTHER): Payer: Self-pay | Admitting: Cardiovascular Disease

## 2017-12-18 VITALS — BP 158/90 | HR 79 | Ht 67.0 in | Wt 158.2 lb

## 2017-12-18 DIAGNOSIS — R079 Chest pain, unspecified: Secondary | ICD-10-CM | POA: Insufficient documentation

## 2017-12-18 DIAGNOSIS — E782 Mixed hyperlipidemia: Secondary | ICD-10-CM

## 2017-12-18 DIAGNOSIS — R55 Syncope and collapse: Secondary | ICD-10-CM

## 2017-12-18 DIAGNOSIS — I493 Ventricular premature depolarization: Secondary | ICD-10-CM

## 2017-12-18 DIAGNOSIS — H53419 Scotoma involving central area, unspecified eye: Secondary | ICD-10-CM | POA: Insufficient documentation

## 2017-12-18 DIAGNOSIS — E785 Hyperlipidemia, unspecified: Secondary | ICD-10-CM

## 2017-12-18 DIAGNOSIS — I10 Essential (primary) hypertension: Secondary | ICD-10-CM

## 2017-12-18 DIAGNOSIS — F17200 Nicotine dependence, unspecified, uncomplicated: Secondary | ICD-10-CM

## 2017-12-18 MED ORDER — ATORVASTATIN CALCIUM 20 MG PO TABS: 20.0000 mg | ORAL_TABLET | Freq: Every day | ORAL | 11 refills | Status: DC

## 2017-12-18 MED ORDER — METOPROLOL SUCCINATE ER 50 MG PO TB24: 50.0000 mg | ORAL_TABLET | Freq: Every day | ORAL | 11 refills | Status: DC

## 2017-12-18 MED ORDER — LOSARTAN POTASSIUM 100 MG PO TABS: 100.0000 mg | ORAL_TABLET | Freq: Every day | ORAL | 11 refills | Status: DC

## 2017-12-18 NOTE — Patient Instructions (Addendum)
Research scotoma (vision abnormality)   Medication Instructions:   Please increase the losartan up to 100 mg daily Increase the metoprolol up to 50 mg daily  Labwork:  No new labs needed  Testing/Procedures:  No further testing at this time  CAll if right leg pain gets severe, We would order lower extremity arterial ultrasound for blockage    Follow-Up: It was a pleasure seeing you in the office today. Please call us if you have new issues that need to be addressed before your next appt.  [phone]  Your physician wants you to follow-up in: As needed   If you need a refill on your cardiac medications before your next appointment, please call your pharmacy.  For educational health videos Log in to : [email or web address] Or : [email or web address], password : [email or web address]

## 2017-12-22 ENCOUNTER — Emergency Department
Admission: EM | Admit: 2017-12-22 | Discharge: 2017-12-22 | Disposition: A | Discharge status: Home / Self Care | Payer: Medicaid Other | Attending: Emergency Medicine | Admitting: Emergency Medicine

## 2017-12-22 ENCOUNTER — Encounter: Payer: Self-pay | Admitting: Emergency Medicine

## 2017-12-22 DIAGNOSIS — I10 Essential (primary) hypertension: Secondary | ICD-10-CM | POA: Insufficient documentation

## 2017-12-22 DIAGNOSIS — Z79899 Other long term (current) drug therapy: Secondary | ICD-10-CM | POA: Insufficient documentation

## 2017-12-22 DIAGNOSIS — T465X5A Adverse effect of other antihypertensive drugs, initial encounter: Secondary | ICD-10-CM | POA: Insufficient documentation

## 2017-12-22 DIAGNOSIS — R5383 Other fatigue: Secondary | ICD-10-CM | POA: Insufficient documentation

## 2017-12-22 DIAGNOSIS — F17210 Nicotine dependence, cigarettes, uncomplicated: Secondary | ICD-10-CM | POA: Insufficient documentation

## 2017-12-22 DIAGNOSIS — T50905A Adverse effect of unspecified drugs, medicaments and biological substances, initial encounter: Secondary | ICD-10-CM

## 2017-12-22 NOTE — ED Triage Notes (Signed)
Pt reports got up this am and took her BP medication. Pt reports that her Losartin was increased from 50mg  to 100mg  3 days ago. Pt reports she got to work and was just not feeling right.

## 2017-12-22 NOTE — ED Triage Notes (Signed)
First Nurse Note:  Arrives with c/o feeling dizzy and 'just not right" this morning.  Patient states her blood pressure medication just changed, and first dose was this morning at 0600.  Losartan 100 mg QD (increased from 50 mg QD)

## 2017-12-22 NOTE — Discharge Instructions (Signed)
Please try 75 mg of Losartan for several days before increasing to 100mg  as we discussed

## 2017-12-22 NOTE — ED Provider Notes (Signed)
[facility] Emergency Department Provider Note   ____________________________________________    I have reviewed the triage vital signs and the nursing notes.   HISTORY  Chief Complaint "Not feeling right "    HPI Heather Mahoney is a 52 y.o. female with a long history of difficult to control hypertension presents today with complaints of "not feeling right ".  She feels somewhat fatigued, had some dizziness earlier which resolved.  She denies nausea vomiting chest pain or palpitations.  No shortness of breath.  She reports he recently increased her losartan to 100 mg from 50, today was the first day she took it this morning.  She suspects that her blood pressure medication is the cause of her not feeling well.  Past Medical History:  Diagnosis Date  . GERD (gastroesophageal reflux disease)   . Heart murmur   . Hiatal hernia   . Hyperlipidemia   . Hypertension    On Hydrochlorothiazide    Patient Active Problem List   Diagnosis Date Noted  . Chest pain with moderate risk for cardiac etiology 12/18/2017  . 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 PVC (premature ventricular contraction) 02/12/2016  . Essential hypertension, benign 02/12/2016  . Tobacco use disorder 02/12/2016  . Migraine headache 01/09/2016  . Lymphocytosis 12/21/2015  . Hypertension 12/12/2015  . Hyperlipidemia 12/12/2015  . Vitamin D deficiency 12/12/2015  . Cardiac murmur 12/12/2015  . Hypertensive urgency 11/29/2015  . Abdominal pulsatile mass 11/29/2015    Past Surgical History:  Procedure Laterality Date  . BREAST SURGERY  1986   I&D for 'milk duct'  . SALPINGOOPHORECTOMY Left 2000    Prior to Admission medications   Medication Sig Start Date End Date Taking? Authorizing Provider  albuterol (PROVENTIL HFA;VENTOLIN HFA) 108 (90 Base) MCG/ACT inhaler Inhale 2 puffs into the lungs every 6 (six) hours as needed for wheezing or shortness of breath. 01/11/16   Lavonia Drafts, MD    atorvastatin (LIPITOR) 20 MG tablet Take 1 tablet (20 mg total) by mouth daily. 12/18/17   Minna Merritts, MD  losartan (COZAAR) 100 MG tablet Take 1 tablet (100 mg total) by mouth daily. 12/18/17   Minna Merritts, MD  metoprolol succinate (TOPROL-XL) 50 MG 24 hr tablet Take 1 tablet (50 mg total) by mouth daily. 12/18/17   Minna Merritts, MD  naproxen sodium (ALEVE) 220 MG tablet Take 220 mg by mouth 2 (two) times daily as needed (pain).    [provider]     Allergies Strawberry extract  Family History  Problem Relation Age of Onset  . Diabetes Mother   . Hypertension Mother   . Hyperlipidemia Mother   . Heart disease Mother        CABG X 4  . Asthma Mother   . Sarcoidosis Mother        In remission  . Heart attack Mother   . Diabetes Maternal Grandmother   . Heart disease Maternal Grandmother   . Hyperlipidemia Maternal Grandmother     Social History Social History   Tobacco Use  . Smoking status: Current Every Day Smoker    Packs/day: 0.25    Years: 38.00    Pack years: 9.50    Types: Cigarettes  . Smokeless tobacco: Never Used  Substance Use Topics  . Alcohol use: Yes    Comment: occassional  . Drug use: Yes    Types: Marijuana    Comment: smokes Marijuana 'when I cant sleep, maybe once or twice  a week'    Review of Systems  Constitutional: No fever Eyes: No visual changes.  ENT: No throat swelling Cardiovascular: No palpitations or chest pain Respiratory: Denies shortness of breath.  No cough gastrointestinal: No abdominal pain.  No nausea, no vomiting.   Genitourinary: Negative for dysuria. Musculoskeletal: Negative for leg swelling Skin: Negative for rash. Neurological: Mild headache, no neuro deficits   ____________________________________________   PHYSICAL EXAM:  VITAL SIGNS: ED Triage Vitals [12/22/17 1111]  Enc Vitals Group     BP (!) 207/125     Pulse Rate 84     Resp 20     Temp 98.1 F (36.7 C)     Temp Source Oral      SpO2 100 %     Weight 71.7 kg (158 lb)     Height 1.702 m (5\' 7" )     Head Circumference      Peak Flow      Pain Score      Pain Loc      Pain Edu?      Excl. in [place]?     Constitutional: Alert and oriented. No acute distress. Pleasant and interactive Eyes: Conjunctivae are normal.   Nose: No congestion/rhinnorhea. Mouth/Throat: Mucous membranes are moist.    Cardiovascular: Normal rate, regular rhythm. Grossly normal heart sounds.  Good peripheral circulation. Respiratory: Normal respiratory effort.  No retractions. Lungs CTAB. Gastrointestinal: Soft and nontender. No distention.  No   Musculoskeletal: No lower extremity tenderness nor edema.  Warm and well perfused Neurologic:  Normal speech and language. No gross focal neurologic deficits are appreciated.  Skin:  Skin is warm, dry and intact. No rash noted. Psychiatric: Mood and affect are normal. Speech and behavior are normal.  ____________________________________________   LABS (all labs ordered are listed, but only abnormal results are displayed)  Labs Reviewed - No data to display ____________________________________________  EKG  None ____________________________________________  RADIOLOGY   ____________________________________________   PROCEDURES  Procedure(s) performed: No  Procedures   Critical Care performed: No ____________________________________________   INITIAL IMPRESSION / ASSESSMENT AND PLAN / ED COURSE  Pertinent labs & imaging results that were available during my care of the patient were reviewed by me and considered in my medical decision making (see chart for details).  Patient well-appearing in no acute distress.  Exam is quite reassuring.  Repeat blood pressure has improved somewhat, long history of significantly elevated blood pressures, working with cardiology to control this.  I do suspect side effects from losartan as the cause of her not feeling well today.  Recommended  that she try to increase her losartan by 25 mg tomorrow to 75 mg and try that for a couple of days.  She will go home and rest today, return precautions discussed    ____________________________________________   FINAL CLINICAL IMPRESSION(S) / ED DIAGNOSES  Final diagnoses:  Medication side effect, initial encounter        Note:  This document was prepared using Dragon voice recognition software and may include unintentional dictation errors.    Lavonia Drafts, MD 12/22/17 [phone]

## 2017-12-22 NOTE — ED Triage Notes (Signed)
Pt BP elevated. Pt reports it remains elevated which is why they increased her BP meds.

## 2017-12-22 NOTE — ED Notes (Signed)
Pt reports PCP changed BP medications and since she has been having dizziness after having taken medications. Pt able to ambulate to room but reports feeling dizzy still.

## 2017-12-22 NOTE — ED Notes (Signed)
Signature pad broken at this time. Paper copy of signature in paper chart.

## 2017-12-22 NOTE — ED Triage Notes (Signed)
Pt denies SOB, CP reports she just feels bad.

## 2018-01-05 ENCOUNTER — Other Ambulatory Visit: Payer: Self-pay

## 2018-01-05 ENCOUNTER — Encounter: Payer: Self-pay | Admitting: Emergency Medicine

## 2018-01-05 ENCOUNTER — Emergency Department
Admission: EM | Admit: 2018-01-05 | Discharge: 2018-01-05 | Disposition: A | Discharge status: Home / Self Care | Payer: Medicaid Other | Attending: Emergency Medicine | Admitting: Emergency Medicine

## 2018-01-05 DIAGNOSIS — R69 Illness, unspecified: Secondary | ICD-10-CM

## 2018-01-05 DIAGNOSIS — F17210 Nicotine dependence, cigarettes, uncomplicated: Secondary | ICD-10-CM | POA: Insufficient documentation

## 2018-01-05 DIAGNOSIS — I10 Essential (primary) hypertension: Secondary | ICD-10-CM | POA: Insufficient documentation

## 2018-01-05 DIAGNOSIS — Z79899 Other long term (current) drug therapy: Secondary | ICD-10-CM | POA: Insufficient documentation

## 2018-01-05 DIAGNOSIS — J111 Influenza due to unidentified influenza virus with other respiratory manifestations: Secondary | ICD-10-CM | POA: Insufficient documentation

## 2018-01-05 LAB — INFLUENZA PANEL BY PCR (TYPE A & B)
INFLAPCR: NEGATIVE
INFLBPCR: NEGATIVE

## 2018-01-05 LAB — GROUP A STREP BY PCR: GROUP A STREP BY PCR: NOT DETECTED

## 2018-01-05 MED ORDER — IBUPROFEN 600 MG PO TABS: 600.0000 mg | ORAL_TABLET | Freq: Three times a day (TID) | ORAL | 0 refills | Status: DC | PRN

## 2018-01-05 MED ORDER — PSEUDOEPH-BROMPHEN-DM 30-2-10 MG/5ML PO SYRP: 5.0000 mL | ORAL_SOLUTION | Freq: Four times a day (QID) | ORAL | 0 refills | Status: DC | PRN

## 2018-01-05 NOTE — ED Provider Notes (Signed)
[facility] Emergency Department Provider Note   ____________________________________________   First MD Initiated Contact with Patient 01/05/18 1241     (approximate)  I have reviewed the triage vital signs and the nursing notes.   HISTORY  Chief Complaint Influenza    HPI Heather Mahoney is a 52 y.o. female patient complaining of generalized body aches, fever, ear, and throat pain.  Onset of complaint was yesterday.  Patient state no nausea vomiting but does have diarrhea.  Patient has been exposed to flu by employees at work.  Patient is not taking flu shot for this season.  Patient rates her pain discomfort as a 3/10.  Patient described the pain is "achy".  No palliative measure for complaint.  Past Medical History:  Diagnosis Date  . GERD (gastroesophageal reflux disease)   . Heart murmur   . Hiatal hernia   . Hyperlipidemia   . Hypertension    On Hydrochlorothiazide    Patient Active Problem List   Diagnosis Date Noted  . Chest pain with moderate risk for cardiac etiology 12/18/2017  . 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 PVC (premature ventricular contraction) 02/12/2016  . Essential hypertension, benign 02/12/2016  . Tobacco use disorder 02/12/2016  . Migraine headache 01/09/2016  . Lymphocytosis 12/21/2015  . Hypertension 12/12/2015  . Hyperlipidemia 12/12/2015  . Vitamin D deficiency 12/12/2015  . Cardiac murmur 12/12/2015  . Hypertensive urgency 11/29/2015  . Abdominal pulsatile mass 11/29/2015    Past Surgical History:  Procedure Laterality Date  . BREAST SURGERY  1986   I&D for 'milk duct'  . SALPINGOOPHORECTOMY Left 2000    Prior to Admission medications   Medication Sig Start Date End Date Taking? Authorizing Provider  albuterol (PROVENTIL HFA;VENTOLIN HFA) 108 (90 Base) MCG/ACT inhaler Inhale 2 puffs into the lungs every 6 (six) hours as needed for wheezing or shortness of breath. 01/11/16   Lavonia Drafts, MD  atorvastatin (LIPITOR)  20 MG tablet Take 1 tablet (20 mg total) by mouth daily. 12/18/17   Minna Merritts, MD  brompheniramine-pseudoephedrine-DM 30-2-10 MG/5ML syrup Take 5 mLs by mouth 4 (four) times daily as needed. 01/05/18   Sable Feil, PA-C  ibuprofen (ADVIL,MOTRIN) 600 MG tablet Take 1 tablet (600 mg total) by mouth every 8 (eight) hours as needed. 01/05/18   Sable Feil, PA-C  losartan (COZAAR) 100 MG tablet Take 1 tablet (100 mg total) by mouth daily. 12/18/17   Minna Merritts, MD  metoprolol succinate (TOPROL-XL) 50 MG 24 hr tablet Take 1 tablet (50 mg total) by mouth daily. 12/18/17   Minna Merritts, MD  naproxen sodium (ALEVE) 220 MG tablet Take 220 mg by mouth 2 (two) times daily as needed (pain).    [provider]    Allergies Strawberry extract and Strawberry flavor  Family History  Problem Relation Age of Onset  . Diabetes Mother   . Hypertension Mother   . Hyperlipidemia Mother   . Heart disease Mother        CABG X 4  . Asthma Mother   . Sarcoidosis Mother        In remission  . Heart attack Mother   . Diabetes Maternal Grandmother   . Heart disease Maternal Grandmother   . Hyperlipidemia Maternal Grandmother     Social History Social History   Tobacco Use  . Smoking status: Current Every Day Smoker    Packs/day: 0.25    Years: 38.00    Pack years: 9.50  Types: Cigarettes  . Smokeless tobacco: Never Used  Substance Use Topics  . Alcohol use: Yes    Comment: occassional  . Drug use: Yes    Types: Marijuana    Comment: smokes Marijuana 'when I cant sleep, maybe once or twice a week'    Review of Systems Constitutional: No fever/chills and body aches. Eyes: No visual changes. ENT: Sore throat.   Cardiovascular: Denies chest pain. Respiratory: Denies shortness of breath.  Nonproductive cough. Gastrointestinal: No abdominal pain.  No nausea, no vomiting.  Diarrhea.  No constipation. Allergic/Immunilogical:  Strawberries ____________________________________________   PHYSICAL EXAM:  VITAL SIGNS: ED Triage Vitals  Enc Vitals Group     BP 01/05/18 1227 (!) 179/112     Pulse Rate 01/05/18 1227 100     Resp 01/05/18 1227 20     Temp 01/05/18 1227 99.1 F (37.3 C)     Temp Source 01/05/18 1227 Oral     SpO2 01/05/18 1227 100 %     Weight 01/05/18 1228 158 lb (71.7 kg)     Height 01/05/18 1228 5\' 7"  (1.702 m)     Head Circumference --      Peak Flow --      Pain Score 01/05/18 1227 3     Pain Loc --      Pain Edu? --      Excl. in [place]? --    Constitutional: Alert and oriented. Well appearing and in no acute distress. Nose: Edematous nasal turbinates clear rhinorrhea . mouth/Throat: Mucous membranes are moist.  Oropharynx non-erythematous.  Postnasal drainage. Neck: No stridor. Cardiovascular: Normal rate, regular rhythm. Grossly normal heart sounds.  Good peripheral circulation.  Elevated blood pressure Respiratory: Normal respiratory effort.  No retractions. Lungs CTAB. Gastrointestinal: Hyperactive bowel sounds.  Soft and nontender. No distention. No abdominal bruits. No CVA tenderness. Neurologic:  Normal speech and language. No gross focal neurologic deficits are appreciated. No gait instability. Skin:  Skin is warm, dry and intact. No rash noted. P ____________________________________________   LABS (all labs ordered are listed, but only abnormal results are displayed)  Labs Reviewed  GROUP A STREP BY PCR  INFLUENZA PANEL BY PCR (TYPE A & B)   ____________________________________________  EKG   ____________________________________________  RADIOLOGY  ED MD interpretation:    Official radiology report(s): No results found.  ____________________________________________   PROCEDURES  Procedure(s) performed: None  Procedures  Critical Care performed: No  ____________________________________________   INITIAL IMPRESSION / ASSESSMENT AND PLAN / ED  COURSE  As part of my medical decision making, I reviewed the following data within the [place]    Patient presents with generalized aching fever sore throat and coughing secondary to viral illness.  Discussed negative flu results with patient.  Patient given discharge care instruction.  Patient given a work note and advised take medication as directed.  Patient advised follow-up [place] if condition does not improve in 3-5 days.      ____________________________________________   FINAL CLINICAL IMPRESSION(S) / ED DIAGNOSES  Final diagnoses:  Influenza-like illness     ED Discharge Orders        Ordered    brompheniramine-pseudoephedrine-DM 30-2-10 MG/5ML syrup  4 times daily PRN     01/05/18 1333    ibuprofen (ADVIL,MOTRIN) 600 MG tablet  Every 8 hours PRN     01/05/18 1333       Note:  This document was prepared using Dragon voice recognition software and may include unintentional dictation  errors.    Sable Feil, PA-C 01/05/18 [place], [place], MD 01/06/18 [phone]

## 2018-01-05 NOTE — ED Notes (Signed)
See triage note  States she developed body aches,headache and bilateral ears pain yesterday  Low grade fever on arrival

## 2018-01-05 NOTE — ED Triage Notes (Signed)
Pt reports that she is having generalized achy, fever, ears and throat are hurting. Her daughter is having the same symptoms.

## 2018-08-10 ENCOUNTER — Emergency Department: Payer: Self-pay

## 2018-08-10 ENCOUNTER — Other Ambulatory Visit: Payer: Self-pay

## 2018-08-10 ENCOUNTER — Inpatient Hospital Stay
Admission: EM | Admit: 2018-08-10 | Discharge: 2018-08-15 | DRG: 269 | Disposition: A | Discharge status: Home / Self Care | Payer: Self-pay | Attending: Internal Medicine | Admitting: Internal Medicine

## 2018-08-10 DIAGNOSIS — K5900 Constipation, unspecified: Secondary | ICD-10-CM | POA: Diagnosis present

## 2018-08-10 DIAGNOSIS — F17210 Nicotine dependence, cigarettes, uncomplicated: Secondary | ICD-10-CM

## 2018-08-10 DIAGNOSIS — I714 Abdominal aortic aneurysm, without rupture, unspecified: Secondary | ICD-10-CM | POA: Diagnosis present

## 2018-08-10 DIAGNOSIS — K449 Diaphragmatic hernia without obstruction or gangrene: Secondary | ICD-10-CM | POA: Diagnosis present

## 2018-08-10 DIAGNOSIS — D649 Anemia, unspecified: Secondary | ICD-10-CM | POA: Diagnosis present

## 2018-08-10 DIAGNOSIS — Z7901 Long term (current) use of anticoagulants: Secondary | ICD-10-CM

## 2018-08-10 DIAGNOSIS — F1290 Cannabis use, unspecified, uncomplicated: Secondary | ICD-10-CM

## 2018-08-10 DIAGNOSIS — I10 Essential (primary) hypertension: Secondary | ICD-10-CM | POA: Diagnosis present

## 2018-08-10 DIAGNOSIS — E782 Mixed hyperlipidemia: Secondary | ICD-10-CM | POA: Diagnosis present

## 2018-08-10 DIAGNOSIS — K219 Gastro-esophageal reflux disease without esophagitis: Secondary | ICD-10-CM | POA: Diagnosis present

## 2018-08-10 DIAGNOSIS — I739 Peripheral vascular disease, unspecified: Secondary | ICD-10-CM | POA: Diagnosis present

## 2018-08-10 DIAGNOSIS — I998 Other disorder of circulatory system: Secondary | ICD-10-CM | POA: Diagnosis present

## 2018-08-10 DIAGNOSIS — Z79899 Other long term (current) drug therapy: Secondary | ICD-10-CM

## 2018-08-10 DIAGNOSIS — Z8249 Family history of ischemic heart disease and other diseases of the circulatory system: Secondary | ICD-10-CM

## 2018-08-10 DIAGNOSIS — Z9114 Patient's other noncompliance with medication regimen: Secondary | ICD-10-CM

## 2018-08-10 DIAGNOSIS — I169 Hypertensive crisis, unspecified: Secondary | ICD-10-CM

## 2018-08-10 DIAGNOSIS — F17200 Nicotine dependence, unspecified, uncomplicated: Secondary | ICD-10-CM | POA: Diagnosis present

## 2018-08-10 DIAGNOSIS — Z23 Encounter for immunization: Secondary | ICD-10-CM

## 2018-08-10 DIAGNOSIS — I161 Hypertensive emergency: Secondary | ICD-10-CM | POA: Diagnosis present

## 2018-08-10 DIAGNOSIS — D696 Thrombocytopenia, unspecified: Secondary | ICD-10-CM | POA: Diagnosis present

## 2018-08-10 DIAGNOSIS — E785 Hyperlipidemia, unspecified: Secondary | ICD-10-CM

## 2018-08-10 HISTORY — DX: Tobacco use: Z72.0

## 2018-08-10 LAB — URINALYSIS, COMPLETE (UACMP) WITH MICROSCOPIC
BACTERIA UA: NONE SEEN
Bilirubin Urine: NEGATIVE
Glucose, UA: NEGATIVE mg/dL
KETONES UR: NEGATIVE mg/dL
Leukocytes, UA: NEGATIVE
Nitrite: NEGATIVE
PROTEIN: NEGATIVE mg/dL
Specific Gravity, Urine: 1.006 (ref 1.005–1.030)
pH: 6 (ref 5.0–8.0)

## 2018-08-10 LAB — CBC
HCT: 36.4 % (ref 36.0–46.0)
Hemoglobin: 11.9 g/dL — ABNORMAL LOW (ref 12.0–15.0)
MCH: 27.7 pg (ref 26.0–34.0)
MCHC: 32.7 g/dL (ref 30.0–36.0)
MCV: 84.7 fL (ref 80.0–100.0)
NRBC: 0 % (ref 0.0–0.2)
PLATELETS: 439 10*3/uL — AB (ref 150–400)
RBC: 4.3 MIL/uL (ref 3.87–5.11)
RDW: 16.4 % — ABNORMAL HIGH (ref 11.5–15.5)
WBC: 11.6 10*3/uL — AB (ref 4.0–10.5)

## 2018-08-10 LAB — BASIC METABOLIC PANEL
ANION GAP: 9 (ref 5–15)
BUN: 13 mg/dL (ref 6–20)
CO2: 27 mmol/L (ref 22–32)
Calcium: 9.2 mg/dL (ref 8.9–10.3)
Chloride: 104 mmol/L (ref 98–111)
Creatinine, Ser: 0.79 mg/dL (ref 0.44–1.00)
Glucose, Bld: 130 mg/dL — ABNORMAL HIGH (ref 70–99)
POTASSIUM: 3.6 mmol/L (ref 3.5–5.1)
SODIUM: 140 mmol/L (ref 135–145)

## 2018-08-10 LAB — HCG, QUANTITATIVE, PREGNANCY: HCG, BETA CHAIN, QUANT, S: 2 m[IU]/mL (ref <5)

## 2018-08-10 LAB — GLUCOSE, CAPILLARY: GLUCOSE-CAPILLARY: 140 mg/dL — AB (ref 70–99)

## 2018-08-10 MED ORDER — ONDANSETRON HCL 4 MG/2ML IJ SOLN
4.0000 mg | Freq: Once | INTRAMUSCULAR | Status: AC
  Administered 2018-08-10: 4 mg via INTRAVENOUS
  Filled 2018-08-10: qty 2

## 2018-08-10 MED ORDER — FENTANYL CITRATE (PF) 100 MCG/2ML IJ SOLN
50.0000 ug | Freq: Once | INTRAMUSCULAR | Status: AC
  Administered 2018-08-10: 50 ug via INTRAVENOUS
  Filled 2018-08-10: qty 2

## 2018-08-10 MED ORDER — METOPROLOL SUCCINATE ER 25 MG PO TB24
50.0000 mg | ORAL_TABLET | Freq: Once | ORAL | Status: AC
  Administered 2018-08-10: 50 mg via ORAL
  Filled 2018-08-10: qty 2

## 2018-08-10 MED ORDER — ALBUTEROL SULFATE (2.5 MG/3ML) 0.083% IN NEBU: 2.5000 mg | INHALATION_SOLUTION | Freq: Four times a day (QID) | RESPIRATORY_TRACT | Status: DC | PRN

## 2018-08-10 MED ORDER — LOSARTAN POTASSIUM 50 MG PO TABS
100.0000 mg | ORAL_TABLET | Freq: Once | ORAL | Status: AC
  Administered 2018-08-10: 100 mg via ORAL
  Filled 2018-08-10 (×2): qty 2

## 2018-08-10 MED ORDER — ATORVASTATIN CALCIUM 20 MG PO TABS
20.0000 mg | ORAL_TABLET | Freq: Every day | ORAL | Status: DC
  Administered 2018-08-11 – 2018-08-14 (×3): 20 mg via ORAL
  Filled 2018-08-10 (×3): qty 1

## 2018-08-10 MED ORDER — NICARDIPINE HCL IN NACL 20-0.86 MG/200ML-% IV SOLN
3.0000 mg/h | Freq: Once | INTRAVENOUS | Status: AC
  Administered 2018-08-10: 5 mg/h via INTRAVENOUS
  Filled 2018-08-10: qty 200

## 2018-08-10 MED ORDER — IOPAMIDOL (ISOVUE-300) INJECTION 61%
100.0000 mL | Freq: Once | INTRAVENOUS | Status: AC | PRN
  Administered 2018-08-10: 100 mL via INTRAVENOUS
  Filled 2018-08-10: qty 100

## 2018-08-10 MED ORDER — INFLUENZA VAC SPLIT QUAD 0.5 ML IM SUSY
0.5000 mL | PREFILLED_SYRINGE | INTRAMUSCULAR | Status: AC
  Administered 2018-08-15: 0.5 mL via INTRAMUSCULAR
  Filled 2018-08-10 (×2): qty 0.5

## 2018-08-10 MED ORDER — ENOXAPARIN SODIUM 40 MG/0.4ML ~~LOC~~ SOLN
40.0000 mg | SUBCUTANEOUS | Status: DC
  Administered 2018-08-11: 40 mg via SUBCUTANEOUS
  Filled 2018-08-10: qty 0.4

## 2018-08-10 MED ORDER — NICARDIPINE HCL IN NACL 20-0.86 MG/200ML-% IV SOLN
3.0000 mg/h | INTRAVENOUS | Status: DC
  Administered 2018-08-10: 8.5 mg/h via INTRAVENOUS
  Administered 2018-08-11: 6 mg/h via INTRAVENOUS
  Administered 2018-08-11: 8.5 mg/h via INTRAVENOUS
  Filled 2018-08-10 (×4): qty 200

## 2018-08-10 NOTE — Consult Note (Signed)
[facility] Vascular Consult Note  MRN : [id number]  Heather Mahoney is a 52 y.o. (Jan 20, 1966) female who presents with chief complaint of  Chief Complaint  Patient presents with  . Pelvic Pain  .  History of Present Illness:   I am asked to evaluate the patient while she is in the emergency room by Dr. Alfred Levins for expanding abdominal aortic aneurysm.  Patient is a 52 year old woman who presented to [facility] earlier today with the abrupt onset of abdominal pain.  On physical examination Dr. Alfred Levins noted a pulsatile abdominal mass consistent with abdominal aortic aneurysm which was very tender.  By the patient's report she considered it extremely tender.  This prompted a CT scan which demonstrated an infrarenal abdominal aortic aneurysm.  Comparison to a study from 1-1/2 years ago there has been over 1.5 cm of growth.  At the time of presentation to the emergency room she was also noted to be in hypertensive crisis with a systolic blood pressure of greater than 200.  She has since been started on parenteral antihypertensives her blood pressure is now under very good control and her abdominal pain is resolving although it is not completely gone.  The patient presents to the office for evaluation of an abdominal aortic aneurysm. The aneurysm was found incidentally by CT scan. Patient denies abdominal pain or unusual back pain, no other abdominal complaints.  No history of an acute onset of painful blue discoloration of the toes.     No family history of AAA.   Patient denies amaurosis fugax or TIA symptoms. There is no history of claudication or rest pain symptoms of the lower extremities.  The patient denies angina or shortness of breath.  CT scan is reviewed by me personally and shows an infrarenal AAA that measures 4.9 cm.    Current Meds  Medication Sig  . albuterol (PROVENTIL HFA;VENTOLIN HFA) 108 (90 Base) MCG/ACT inhaler Inhale 2  puffs into the lungs every 6 (six) hours as needed for wheezing or shortness of breath.  Marland Kitchen atorvastatin (LIPITOR) 20 MG tablet Take 1 tablet (20 mg total) by mouth daily.  Marland Kitchen ibuprofen (ADVIL,MOTRIN) 600 MG tablet Take 1 tablet (600 mg total) by mouth every 8 (eight) hours as needed.  Marland Kitchen losartan (COZAAR) 100 MG tablet Take 1 tablet (100 mg total) by mouth daily.  . metoprolol succinate (TOPROL-XL) 50 MG 24 hr tablet Take 1 tablet (50 mg total) by mouth daily.  . naproxen sodium (ALEVE) 220 MG tablet Take 220 mg by mouth 2 (two) times daily as needed (pain).    Past Medical History:  Diagnosis Date  . GERD (gastroesophageal reflux disease)   . Heart murmur   . Hiatal hernia   . Hyperlipidemia   . Hypertension    On Hydrochlorothiazide    Past Surgical History:  Procedure Laterality Date  . BREAST SURGERY  1986   I&D for 'milk duct'  . SALPINGOOPHORECTOMY Left 2000    Social History Social History   Tobacco Use  . Smoking status: Current Every Day Smoker    Packs/day: 0.25    Years: 38.00    Pack years: 9.50    Types: Cigarettes  . Smokeless tobacco: Never Used  Substance Use Topics  . Alcohol use: Yes    Comment: occassional  . Drug use: Yes    Types: Marijuana    Comment: smokes Marijuana 'when I cant sleep, maybe once or twice a week'  Family History Family History  Problem Relation Age of Onset  . Diabetes Mother   . Hypertension Mother   . Hyperlipidemia Mother   . Heart disease Mother        CABG X 4  . Asthma Mother   . Sarcoidosis Mother        In remission  . Heart attack Mother   . Diabetes Maternal Grandmother   . Heart disease Maternal Grandmother   . Hyperlipidemia Maternal Grandmother   No family history of bleeding/clotting disorders, porphyria or autoimmune disease   Allergies  Allergen Reactions  . Strawberry Extract Hives and Swelling  . Strawberry Flavor Rash     REVIEW OF SYSTEMS (Negative unless checked)  Constitutional:  [] Weight loss  [] Fever  [] Chills Cardiac: [] Chest pain   [] Chest pressure   [] Palpitations   [] Shortness of breath when laying flat   [] Shortness of breath at rest   [] Shortness of breath with exertion. Vascular:  [] Pain in legs with walking   [] Pain in legs at rest   [] Pain in legs when laying flat   [] Claudication   [] Pain in feet when walking  [] Pain in feet at rest  [] Pain in feet when laying flat   [] History of DVT   [] Phlebitis   [] Swelling in legs   [] Varicose veins   [] Non-healing ulcers Pulmonary:   [] Uses home oxygen   [] Productive cough   [] Hemoptysis   [] Wheeze  [] COPD   [] Asthma Neurologic:  [] Dizziness  [] Blackouts   [] Seizures   [] History of stroke   [] History of TIA  [] Aphasia   [] Temporary blindness   [] Dysphagia   [x] Weakness or numbness in arms   [] Weakness or numbness in legs Musculoskeletal:  [] Arthritis   [] Joint swelling   [] Joint pain   [] Low back pain Hematologic:  [] Easy bruising  [] Easy bleeding   [] Hypercoagulable state   [] Anemic  [] Hepatitis Gastrointestinal:  [] Blood in stool   [] Vomiting blood  [] Gastroesophageal reflux/heartburn   [] Difficulty swallowing. Genitourinary:  [] Chronic kidney disease   [] Difficult urination  [] Frequent urination  [] Burning with urination   [] Blood in urine Skin:  [] Rashes   [] Ulcers   [] Wounds Psychological:  [] History of anxiety   []  History of major depression.  Physical Examination  Vitals:   08/10/18 1818 08/10/18 2030 08/10/18 2115 08/10/18 2215  BP: (!) 177/103 (!) 150/106 139/82 125/89  Pulse: 77 (!) 58 76 72  Resp: 16 13 16 15   Temp:      TempSrc:      SpO2: 100% 98% 99% 95%  Weight:      Height:       Body mass index is 23.15 kg/m. Gen:  WD/WN, NAD Head: [place]/AT, No temporalis wasting. Prominent temp pulse not noted. Ear/Nose/Throat: Hearing grossly intact, nares w/o erythema or drainage, oropharynx w/o Erythema/Exudate Eyes: PERRLA, EOMI.  Neck: Supple, no nuchal rigidity.  No bruit or JVD.  Pulmonary:  Good air  movement, clear to auscultation bilaterally.  Cardiac: RRR, normal S1, S2, no Murmurs, rubs or gallops. Vascular: Abdominal aorta demonstrates a pulsatile mass which is tender to palpation Vessel Right Left  Radial Palpable Palpable  Aorta Palpable tender N/A  Femoral Palpable Palpable  Popliteal Palpable Palpable  PT Palpable Palpable  DP Palpable Palpable   Gastrointestinal: soft, non-tender/non-distended. No guarding/reflex. No masses, surgical incisions, or scars. Musculoskeletal: M/S 5/5 throughout.  Extremities without ischemic changes.  No deformity or atrophy. No edema. Neurologic: CN 2-12 intact. Pain and light touch intact in extremities.  Symmetrical.  Speech is fluent. Motor exam as listed above. Psychiatric: Judgment intact, Mood & affect appropriate for pt's clinical situation. Dermatologic: No rashes or ulcers noted.  No cellulitis or open wounds. Lymph : No Cervical, Axillary, or Inguinal lymphadenopathy.    CBC Lab Results  Component Value Date   WBC 11.6 (H) 08/10/2018   HGB 11.9 (L) 08/10/2018   HCT 36.4 08/10/2018   MCV 84.7 08/10/2018   PLT 439 (H) 08/10/2018    BMET    Component Value Date/Time   NA 140 08/10/2018 1607   K 3.6 08/10/2018 1607   CL 104 08/10/2018 1607   CO2 27 08/10/2018 1607   GLUCOSE 130 (H) 08/10/2018 1607   BUN 13 08/10/2018 1607   CREATININE 0.79 08/10/2018 1607   CALCIUM 9.2 08/10/2018 1607   GFRNONAA >60 08/10/2018 1607   GFRAA >60 08/10/2018 1607   Estimated Creatinine Clearance: 80.6 mL/min (by C-G formula based on SCr of 0.79 mg/dL).  COAG Lab Results  Component Value Date   INR 1.08 01/08/2016    Radiology Ct Abdomen Pelvis W Contrast  Result Date: 08/10/2018 CLINICAL DATA:  Pt c/o pelvic pain. More on L side. Has had an ovary removed but unsure which side. Denies bleeding or discharge. Pain x 2 days. Denies NANDVANDDANDfever. Still has appendix, gallbladder. States urinary frequency. Denies back pain EXAM: CT  ABDOMEN AND PELVIS WITH CONTRAST TECHNIQUE: Multidetector CT imaging of the abdomen and pelvis was performed using the standard protocol following bolus administration of intravenous contrast. CONTRAST:  164mL ISOVUE-300 IOPAMIDOL (ISOVUE-300) INJECTION 61% COMPARISON:  CT of the abdomen and pelvis on 11/11/2016 FINDINGS: Lower chest: No acute abnormality. Hepatobiliary: The liver is homogeneous. No focal liver lesions. Gallbladder is present and normal in CT appearance. Pancreas: Unremarkable. No pancreatic ductal dilatation or surrounding inflammatory changes. Spleen: Normal in size without focal abnormality. Adrenals/Urinary Tract: The adrenal glands are normal in appearance. No hydronephrosis or renal mass. No ureteral obstruction. Urinary bladder is decompressed. Stomach/Bowel: The stomach and small bowel loops are normal in appearance. The appendix is well seen and has a normal appearance. Moderate stool burden in otherwise normal appearing loops of large bowel. Vascular/Lymphatic: There is atherosclerotic calcification of the abdominal aorta. Infrarenal abdominal aortic aneurysm contains mural thrombus and measures 4.1 x 3.8 centimeters, previously 3.3 centimeters. Reproductive: The uterus is present.  No adnexal mass. Other: No free pelvic fluid. Small fat containing supraumbilical hernia. Musculoskeletal: No acute or significant osseous findings. IMPRESSION: 1. Large stool burden. 2. Increased size of infrarenal abdominal aortic aneurysm, now 4.1 centimeters. Recommend followup by ultrasound in 1 year. This recommendation follows ACR consensus guidelines: White Paper of the ACR Incidental Findings Committee II on Vascular Findings. J Am Coll Radiol 2013; 16:109-604. 3. Aortic Atherosclerosis (ICD10-I70.0). Aortic aneurysm NOS (ICD10-I71.9). Electronically Signed   By: Nolon Nations M.D.   On: 08/10/2018 19:23     Assessment/Plan 1.  Symptomatic abdominal aortic aneurysm: Recommend: The aneurysm is  approximately 5 cm and has demonstrated rapid growth over the past year and a half.  It is tender by palpation and associated with hypertensive crisis.  Therefore should undergo repair. Patient is status post CT scan of the abdominal aorta. The patient is a candidate for endovascular repair.  Tentatively I plan for repair on Friday 08/13/2018  She will require cardiac evaluation.   The patient will continue antiplatelet therapy as prescribed (since the patient is undergoing endovascular repair as opposed to open repair) as well as aggressive management of hypertension and  hyperlipidemia. Exercise is again strongly encouraged.   The patient is reminded that lifetime routine surveillance is a necessity with an endograft.   The risks and benefits of AAA repair are reviewed with the patient.  All questions are answered.  Alternative therapies are also discussed.  The patient agrees to proceed with endovascular aneurysm repair.  Patient will follow-up with me in the office after the surgery.  2.  Hypertension: Patient is admitted to the intensive care unit she will remain on parenteral antihypertensives until she can be transitioned to oral agents with good control.  3.  Hyperlipidemia: Continue statin as ordered and reviewed, no changes at this time    Hortencia Pilar, MD  08/10/2018 11:48 PM

## 2018-08-10 NOTE — ED Notes (Signed)
[facility] RN aware of bed assigned

## 2018-08-10 NOTE — H&P (Signed)
[place] [facility] [place] NAME: Jinan Ulin    MR#:  [id number]  DATE OF BIRTH:  11/17/1965  DATE OF ADMISSION:  08/10/2018  PRIMARY CARE PHYSICIAN: Patient, No Pcp Per   REQUESTING/REFERRING PHYSICIAN: Rudene Re, MD  CHIEF COMPLAINT:   Chief Complaint  Patient presents with  . Pelvic Pain    HISTORY OF PRESENT ILLNESS:  Benjamin Torry  is a 52 y.o. female who presents with chief complaint as above. Patient describes suprapubic bilateral 10/10 sharp groin pain more prominent on Left.  Patient's BP >200/110, controlled on nicardipine drip.  Patient states this began Monday evening and has been getting progressively worse, tonight she is unable to reposition without sharp pain.  Patient also described right lower extremity pain on Sunday evening sharp that encompassed entire leg, this pain went away with rest and heat.  Patient states she has been experiencing bilateral lower extremity pain intermittently with walking/activity, worse on the right.  ED CT showed an infrarenal AAA that had grown from 3.3 to 4.1 cm in 2 years.  Dr. Alfred Levins confirmed consulting Dr. Ronalee Belts from vascular surgery who is on his way to see the patient.  Hospitalist called for admission would appreciate vascular surgery's recommendations.  PAST MEDICAL HISTORY:   Past Medical History:  Diagnosis Date  . GERD (gastroesophageal reflux disease)   . Heart murmur   . Hiatal hernia   . Hyperlipidemia   . Hypertension    On Hydrochlorothiazide    PAST SURGICAL HISTORY:   Past Surgical History:  Procedure Laterality Date  . BREAST SURGERY  1986   I&D for 'milk duct'  . SALPINGOOPHORECTOMY Left 2000    SOCIAL HISTORY:   Social History   Tobacco Use  . Smoking status: Current Every Day Smoker    Packs/day: 0.25    Years: 38.00    Pack years: 9.50    Types: Cigarettes  . Smokeless tobacco: Never Used  Substance Use Topics  . Alcohol use: Yes     Comment: occassional    FAMILY HISTORY:   Family History  Problem Relation Age of Onset  . Diabetes Mother   . Hypertension Mother   . Hyperlipidemia Mother   . Heart disease Mother        CABG X 4  . Asthma Mother   . Sarcoidosis Mother        In remission  . Heart attack Mother   . Diabetes Maternal Grandmother   . Heart disease Maternal Grandmother   . Hyperlipidemia Maternal Grandmother     DRUG ALLERGIES:   Allergies  Allergen Reactions  . Strawberry Extract Hives and Swelling  . Strawberry Flavor Rash    MEDICATIONS AT HOME:   Prior to Admission medications   Medication Sig Start Date End Date Taking? Authorizing Provider  albuterol (PROVENTIL HFA;VENTOLIN HFA) 108 (90 Base) MCG/ACT inhaler Inhale 2 puffs into the lungs every 6 (six) hours as needed for wheezing or shortness of breath. 01/11/16  Yes Lavonia Drafts, MD  atorvastatin (LIPITOR) 20 MG tablet Take 1 tablet (20 mg total) by mouth daily. 12/18/17  Yes Gollan, Kathlene November, MD  ibuprofen (ADVIL,MOTRIN) 600 MG tablet Take 1 tablet (600 mg total) by mouth every 8 (eight) hours as needed. 01/05/18  Yes Sable Feil, PA-C  losartan (COZAAR) 100 MG tablet Take 1 tablet (100 mg total) by mouth daily. 12/18/17  Yes Minna Merritts, MD  metoprolol succinate (TOPROL-XL) 50 MG 24 hr  tablet Take 1 tablet (50 mg total) by mouth daily. 12/18/17  Yes Minna Merritts, MD  naproxen sodium (ALEVE) 220 MG tablet Take 220 mg by mouth 2 (two) times daily as needed (pain).   Yes [provider]  brompheniramine-pseudoephedrine-DM 30-2-10 MG/5ML syrup Take 5 mLs by mouth 4 (four) times daily as needed. Patient not taking: Reported on 08/10/2018 01/05/18   Sable Feil, PA-C    REVIEW OF SYSTEMS:  Review of Systems  Constitutional: Negative for chills, fever, malaise/fatigue and weight loss.  HENT: Negative for ear pain, hearing loss and tinnitus.   Eyes: Negative for blurred vision, double vision, pain and redness.   Respiratory: Negative for cough, hemoptysis, shortness of breath and wheezing.   Cardiovascular: Negative for chest pain, palpitations, orthopnea and leg swelling.  Gastrointestinal: Positive for abdominal pain. Negative for constipation, diarrhea, nausea and vomiting.  Genitourinary: Negative for dysuria, flank pain, frequency, hematuria and urgency.  Musculoskeletal: Negative for back pain, joint pain and neck pain.  Skin: Negative.        No acne, rash, or lesions  Neurological: Negative for dizziness, tremors, focal weakness, weakness and headaches.  Endo/Heme/Allergies: Negative for polydipsia. Does not bruise/bleed easily.  Psychiatric/Behavioral: Negative for depression. The patient is not nervous/anxious and does not have insomnia.      VITAL SIGNS:   Vitals:   08/10/18 1556 08/10/18 1557 08/10/18 1818 08/10/18 2030  BP: (!) 211/111  (!) 177/103 (!) 150/106  Pulse: 82  77 (!) 58  Resp:   16 13  Temp: 98.1 F (36.7 C)     TempSrc: Oral     SpO2: 99%  100% 98%  Weight:  68 kg    Height:  5' 7.5" (1.715 m)     Wt Readings from Last 3 Encounters:  08/10/18 68 kg  01/05/18 71.7 kg  12/22/17 71.7 kg    PHYSICAL EXAMINATION:  Physical Exam  Vitals reviewed. Constitutional: She is oriented to person, place, and time. She appears well-developed and well-nourished. No distress.  HENT:  Head: Normocephalic and atraumatic.  Mouth/Throat: Oropharynx is clear and moist.  Eyes: Pupils are equal, round, and reactive to light. Conjunctivae and EOM are normal. No scleral icterus.  Neck: Normal range of motion. Neck supple. No JVD present. No thyromegaly present.  Cardiovascular: Normal rate, regular rhythm and intact distal pulses. Exam reveals no gallop and no friction rub.  Murmur heard. Respiratory: Effort normal and breath sounds normal. No respiratory distress. She has no wheezes. She has no rales.  GI: Soft. Bowel sounds are normal. She exhibits distension (bilateral lower  quadrants). There is tenderness (bilateral superpubic- LLQ worse then RLQ).  Genitourinary:  Genitourinary Comments: deferred  Musculoskeletal: Normal range of motion. She exhibits no edema or tenderness.  No arthritis, no gout  Lymphadenopathy:    She has no cervical adenopathy.  Neurological: She is alert and oriented to person, place, and time. No cranial nerve deficit.  No dysarthria, no aphasia  Skin: Skin is warm and dry. No rash noted. No erythema.  No ecchymosis noted on back/flank/abdomen  Psychiatric: She has a normal mood and affect. Her behavior is normal. Judgment and thought content normal.    LABORATORY PANEL:   CBC Recent Labs  Lab 08/10/18 1607  WBC 11.6*  HGB 11.9*  HCT 36.4  PLT 439*   ------------------------------------------------------------------------------------------------------------------  Chemistries  Recent Labs  Lab 08/10/18 1607  NA 140  K 3.6  CL 104  CO2 27  GLUCOSE 130*  BUN 13  CREATININE 0.79  CALCIUM 9.2   ------------------------------------------------------------------------------------------------------------------  Cardiac Enzymes No results for input(s): TROPONINI in the last 168 hours. ------------------------------------------------------------------------------------------------------------------  RADIOLOGY:  Ct Abdomen Pelvis W Contrast  Result Date: 08/10/2018 CLINICAL DATA:  Pt c/o pelvic pain. More on L side. Has had an ovary removed but unsure which side. Denies bleeding or discharge. Pain x 2 days. Denies NANDVANDDANDfever. Still has appendix, gallbladder. States urinary frequency. Denies back pain EXAM: CT ABDOMEN AND PELVIS WITH CONTRAST TECHNIQUE: Multidetector CT imaging of the abdomen and pelvis was performed using the standard protocol following bolus administration of intravenous contrast. CONTRAST:  150mL ISOVUE-300 IOPAMIDOL (ISOVUE-300) INJECTION 61% COMPARISON:  CT of the abdomen and pelvis on  11/11/2016 FINDINGS: Lower chest: No acute abnormality. Hepatobiliary: The liver is homogeneous. No focal liver lesions. Gallbladder is present and normal in CT appearance. Pancreas: Unremarkable. No pancreatic ductal dilatation or surrounding inflammatory changes. Spleen: Normal in size without focal abnormality. Adrenals/Urinary Tract: The adrenal glands are normal in appearance. No hydronephrosis or renal mass. No ureteral obstruction. Urinary bladder is decompressed. Stomach/Bowel: The stomach and small bowel loops are normal in appearance. The appendix is well seen and has a normal appearance. Moderate stool burden in otherwise normal appearing loops of large bowel. Vascular/Lymphatic: There is atherosclerotic calcification of the abdominal aorta. Infrarenal abdominal aortic aneurysm contains mural thrombus and measures 4.1 x 3.8 centimeters, previously 3.3 centimeters. Reproductive: The uterus is present.  No adnexal mass. Other: No free pelvic fluid. Small fat containing supraumbilical hernia. Musculoskeletal: No acute or significant osseous findings. IMPRESSION: 1. Large stool burden. 2. Increased size of infrarenal abdominal aortic aneurysm, now 4.1 centimeters. Recommend followup by ultrasound in 1 year. This recommendation follows ACR consensus guidelines: White Paper of the ACR Incidental Findings Committee II on Vascular Findings. J Am Coll Radiol 2013; 06:301-601. 3. Aortic Atherosclerosis (ICD10-I70.0). Aortic aneurysm NOS (ICD10-I71.9). Electronically Signed   By: Nolon Nations M.D.   On: 08/10/2018 19:23    EKG:   Orders placed or performed in visit on 12/18/17  . EKG 12-Lead    IMPRESSION AND PLAN:  Principal Problem:   AAA (abdominal aortic aneurysm) [facility]):  Vascular surgery consultation ordered by ED MD, nicardipine gtt to keep BP < 140/90, patient admitted to [facility] for close monitoring.  Active Problems:  Uncontrolled Hypertension: controlled on nicardipine gtt, keep BP <  140/90.  Will restart home medications once patient is stabilized.   Hyperlipidemia: home medication continued.   Chart review performed and case discussed with ED provider. Labs, imaging and/or ECG reviewed by provider and discussed with patient/family. Management plans discussed with the patient and/or family.  DVT PROPHYLAXIS: SubQ lovenox   GI PROPHYLAXIS:  None  ADMISSION STATUS: Inpatient     CODE STATUS: FULL  TOTAL TIME TAKING CARE OF THIS PATIENT: 45 minutes.   Raiyan Dalesandro Burchinal 08/10/2018, 9:31 PM  [facility]  Office  [phone]  CC: Primary care physician; Patient, No Pcp Per  Note:  This document was prepared using Dragon voice recognition software and may include unintentional dictation errors.

## 2018-08-10 NOTE — ED Triage Notes (Signed)
Pt c/o pelvic pain. More on L side. Has had an ovary removed but unsure which side. Denies bleeding or discharge. Pain x 2 days. Denies N&V&D&fever. Still has appendix, gallbladder. States urinary frequency. Denies back pain. A&O, ambulatory.

## 2018-08-10 NOTE — ED Notes (Signed)
Floor unable to take report at this time. Will call back.  

## 2018-08-10 NOTE — ED Notes (Signed)
Patient transported to CT 

## 2018-08-10 NOTE — ED Provider Notes (Signed)
[facility] Emergency Department Provider Note  ____________________________________________  Time seen: Approximately 6:03 PM  I have reviewed the triage vital signs and the nursing notes.   HISTORY  Chief Complaint Pelvic Pain   HPI Heather Mahoney is a 52 y.o. female with a history of left salpingo-oophorectomy, hypertension, hyperlipidemia, GERD who presents for evaluation of abdominal pain.  Patient reports that she woke up this afternoon with severe sharp stabbing left lower quadrant abdominal pain.  The pain goes away if she stays still and does not move but when she moves the pain becomes severe.  No nausea, vomiting, diarrhea, constipation, vaginal discharge, dysuria, hematuria, chest pain, shortness of breath.  Patient reports similar pain when she had to have her left ovary removed.  No other abdominal surgeries.  Past Medical History:  Diagnosis Date  . GERD (gastroesophageal reflux disease)   . Heart murmur   . Hiatal hernia   . Hyperlipidemia   . Hypertension    On Hydrochlorothiazide    Patient Active Problem List   Diagnosis Date Noted  . Chest pain with moderate risk for cardiac etiology 12/18/2017  . 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 PVC (premature ventricular contraction) 02/12/2016  . Essential hypertension, benign 02/12/2016  . Tobacco use disorder 02/12/2016  . Migraine headache 01/09/2016  . Lymphocytosis 12/21/2015  . Hypertension 12/12/2015  . Hyperlipidemia 12/12/2015  . Vitamin D deficiency 12/12/2015  . Cardiac murmur 12/12/2015  . Hypertensive urgency 11/29/2015  . Abdominal pulsatile mass 11/29/2015    Past Surgical History:  Procedure Laterality Date  . BREAST SURGERY  1986   I&D for 'milk duct'  . SALPINGOOPHORECTOMY Left 2000    Prior to Admission medications   Medication Sig Start Date End Date Taking? Authorizing Provider  albuterol (PROVENTIL HFA;VENTOLIN HFA) 108 (90 Base) MCG/ACT inhaler Inhale 2 puffs into the  lungs every 6 (six) hours as needed for wheezing or shortness of breath. 01/11/16   Lavonia Drafts, MD  atorvastatin (LIPITOR) 20 MG tablet Take 1 tablet (20 mg total) by mouth daily. 12/18/17   Minna Merritts, MD  brompheniramine-pseudoephedrine-DM 30-2-10 MG/5ML syrup Take 5 mLs by mouth 4 (four) times daily as needed. 01/05/18   Sable Feil, PA-C  ibuprofen (ADVIL,MOTRIN) 600 MG tablet Take 1 tablet (600 mg total) by mouth every 8 (eight) hours as needed. 01/05/18   Sable Feil, PA-C  losartan (COZAAR) 100 MG tablet Take 1 tablet (100 mg total) by mouth daily. 12/18/17   Minna Merritts, MD  metoprolol succinate (TOPROL-XL) 50 MG 24 hr tablet Take 1 tablet (50 mg total) by mouth daily. 12/18/17   Minna Merritts, MD  naproxen sodium (ALEVE) 220 MG tablet Take 220 mg by mouth 2 (two) times daily as needed (pain).    [provider]    Allergies Strawberry extract and Strawberry flavor  Family History  Problem Relation Age of Onset  . Diabetes Mother   . Hypertension Mother   . Hyperlipidemia Mother   . Heart disease Mother        CABG X 4  . Asthma Mother   . Sarcoidosis Mother        In remission  . Heart attack Mother   . Diabetes Maternal Grandmother   . Heart disease Maternal Grandmother   . Hyperlipidemia Maternal Grandmother     Social History Social History   Tobacco Use  . Smoking status: Current Every Day Smoker    Packs/day: 0.25  Years: 38.00    Pack years: 9.50    Types: Cigarettes  . Smokeless tobacco: Never Used  Substance Use Topics  . Alcohol use: Yes    Comment: occassional  . Drug use: Yes    Types: Marijuana    Comment: smokes Marijuana 'when I cant sleep, maybe once or twice a week'    Review of Systems  Constitutional: Negative for fever. Eyes: Negative for visual changes. ENT: Negative for sore throat. Neck: No neck pain  Cardiovascular: Negative for chest pain. Respiratory: Negative for shortness of  breath. Gastrointestinal: + LLQ abdominal pain. No vomiting or diarrhea. Genitourinary: Negative for dysuria. Musculoskeletal: Negative for back pain. Skin: Negative for rash. Neurological: Negative for headaches, weakness or numbness. Psych: No SI or HI  ____________________________________________   PHYSICAL EXAM:  VITAL SIGNS: ED Triage Vitals  Enc Vitals Group     BP 08/10/18 1556 (!) 211/111     Pulse Rate 08/10/18 1556 82     Resp --      Temp 08/10/18 1556 98.1 F (36.7 C)     Temp Source 08/10/18 1556 Oral     SpO2 08/10/18 1556 99 %     Weight 08/10/18 1557 150 lb (68 kg)     Height 08/10/18 1557 5' 7.5" (1.715 m)     Head Circumference --      Peak Flow --      Pain Score 08/10/18 1602 5     Pain Loc --      Pain Edu? --      Excl. in [place]? --     Constitutional: Alert and oriented. Well appearing and in no apparent distress. HEENT:      Head: Normocephalic and atraumatic.         Eyes: Conjunctivae are normal. Sclera is non-icteric.       Mouth/Throat: Mucous membranes are moist.       Neck: Supple with no signs of meningismus. Cardiovascular: Regular rate and rhythm. No murmurs, gallops, or rubs. 2+ symmetrical distal pulses are present in all extremities. No JVD. Respiratory: Normal respiratory effort. Lungs are clear to auscultation bilaterally. No wheezes, crackles, or rhonchi.  Gastrointestinal: Soft, tender to palpation on the left upper quadrant with prominent palpable aorta on exam, no LLQ or RLQ ttp, and non distended with positive bowel sounds. No rebound or guarding. Genitourinary: No CVA tenderness. Musculoskeletal: Nontender with normal range of motion in all extremities. No edema, cyanosis, or erythema of extremities. Neurologic: Normal speech and language. Face is symmetric. Moving all extremities. No gross focal neurologic deficits are appreciated. Skin: Skin is warm, dry and intact. No rash noted. Psychiatric: Mood and affect are normal. Speech  and behavior are normal.  ____________________________________________   LABS (all labs ordered are listed, but only abnormal results are displayed)  Labs Reviewed  URINALYSIS, COMPLETE (UACMP) WITH MICROSCOPIC - Abnormal; Notable for the following components:      Result Value   Color, Urine STRAW (*)    APPearance CLEAR (*)    Hgb urine dipstick SMALL (*)    All other components within normal limits  CBC - Abnormal; Notable for the following components:   WBC 11.6 (*)    Hemoglobin 11.9 (*)    RDW 16.4 (*)    Platelets 439 (*)    All other components within normal limits  BASIC METABOLIC PANEL - Abnormal; Notable for the following components:   Glucose, Bld 130 (*)    All other components within normal  limits  HCG, QUANTITATIVE, PREGNANCY  POC URINE PREG, ED   ____________________________________________  EKG  none  ____________________________________________  RADIOLOGY  I have personally reviewed the images performed during this visit and I agree with the Radiologist's read.   Interpretation by Radiologist:  Ct Abdomen Pelvis W Contrast  Result Date: 08/10/2018 CLINICAL DATA:  Pt c/o pelvic pain. More on L side. Has had an ovary removed but unsure which side. Denies bleeding or discharge. Pain x 2 days. Denies NANDVANDDANDfever. Still has appendix, gallbladder. States urinary frequency. Denies back pain EXAM: CT ABDOMEN AND PELVIS WITH CONTRAST TECHNIQUE: Multidetector CT imaging of the abdomen and pelvis was performed using the standard protocol following bolus administration of intravenous contrast. CONTRAST:  184mL ISOVUE-300 IOPAMIDOL (ISOVUE-300) INJECTION 61% COMPARISON:  CT of the abdomen and pelvis on 11/11/2016 FINDINGS: Lower chest: No acute abnormality. Hepatobiliary: The liver is homogeneous. No focal liver lesions. Gallbladder is present and normal in CT appearance. Pancreas: Unremarkable. No pancreatic ductal dilatation or surrounding inflammatory changes.  Spleen: Normal in size without focal abnormality. Adrenals/Urinary Tract: The adrenal glands are normal in appearance. No hydronephrosis or renal mass. No ureteral obstruction. Urinary bladder is decompressed. Stomach/Bowel: The stomach and small bowel loops are normal in appearance. The appendix is well seen and has a normal appearance. Moderate stool burden in otherwise normal appearing loops of large bowel. Vascular/Lymphatic: There is atherosclerotic calcification of the abdominal aorta. Infrarenal abdominal aortic aneurysm contains mural thrombus and measures 4.1 x 3.8 centimeters, previously 3.3 centimeters. Reproductive: The uterus is present.  No adnexal mass. Other: No free pelvic fluid. Small fat containing supraumbilical hernia. Musculoskeletal: No acute or significant osseous findings. IMPRESSION: 1. Large stool burden. 2. Increased size of infrarenal abdominal aortic aneurysm, now 4.1 centimeters. Recommend followup by ultrasound in 1 year. This recommendation follows ACR consensus guidelines: White Paper of the ACR Incidental Findings Committee II on Vascular Findings. J Am Coll Radiol 2013; 16:010-932. 3. Aortic Atherosclerosis (ICD10-I70.0). Aortic aneurysm NOS (ICD10-I71.9). Electronically Signed   By: Nolon Nations M.D.   On: 08/10/2018 19:23      ____________________________________________   PROCEDURES  Procedure(s) performed: None Procedures Critical Care performed: yes  CRITICAL CARE Performed by: Rudene Re  ?  Total critical care time: 35 min  Critical care time was exclusive of separately billable procedures and treating other patients.  Critical care was necessary to treat or prevent imminent or life-threatening deterioration.  Critical care was time spent personally by me on the following activities: development of treatment plan with patient and/or surrogate as well as nursing, discussions with consultants, evaluation of patient's response to treatment,  examination of patient, obtaining history from patient or surrogate, ordering and performing treatments and interventions, ordering and review of laboratory studies, ordering and review of radiographic studies, pulse oximetry and re-evaluation of patient's condition.  ____________________________________________   INITIAL IMPRESSION / ASSESSMENT AND PLAN / ED COURSE  52 y.o. female with a history of left salpingo-oophorectomy, hypertension, hyperlipidemia, GERD who presents for evaluation of sudden onset of L sided abdominal pain.  Patient is well-appearing, blood pressure is extremely elevated in the setting of not taking her blood pressure medications today, abdomen is soft with tenderness to palpation over the left upper quadrant, patient is aorta is prominent on exam, there is no left lower quadrant or right lower quadrant tenderness.  Differential diagnoses including aneurysm versus dissection versus diverticulitis versus SBO versus peptic ulcer disease versus kidney stone versus gastritis.  Urinalysis showing small amount of blood.  CBC showing mild leukocytosis with white count of 11.6.  CMP and lipase are within normal limits. HCG pending to rule out ectopic.  Clinical Course as of Aug 10 1957  Tue Aug 10, 2018  1956 CT concerning for symptomatic 4.1 infrarenal AAA. BP trending down but still elevated at 010 systolic. Will start nicardipine and admit to vascular surgery. Dr. Delana Meyer paged but he is scrubbed in the OR and will eval patient at the end of his case.    [CV]    Clinical Course User Index [CV] Alfred Levins [place], MD     As part of my medical decision making, I reviewed the following data within the [place] notes reviewed and incorporated, Labs reviewed , Old chart reviewed, Radiograph reviewed , A consult was requested and obtained from this/these consultant(s) vascular surgery, Notes from prior ED visits and [place] Controlled Substance  Database    Pertinent labs & imaging results that were available during my care of the patient were reviewed by me and considered in my medical decision making (see chart for details).    ____________________________________________   FINAL CLINICAL IMPRESSION(S) / ED DIAGNOSES  Final diagnoses:  Abdominal aortic aneurysm (AAA) >39 mm diameter (HCC)      NEW MEDICATIONS STARTED DURING THIS VISIT:  ED Discharge Orders    None       Note:  This document was prepared using Dragon voice recognition software and may include unintentional dictation errors.    Rudene Re, MD 08/10/18 [place]

## 2018-08-11 DIAGNOSIS — I714 Abdominal aortic aneurysm, without rupture: Secondary | ICD-10-CM

## 2018-08-11 LAB — CBC
HCT: 38.7 % (ref 36.0–46.0)
Hemoglobin: 12.3 g/dL (ref 12.0–15.0)
MCH: 26.9 pg (ref 26.0–34.0)
MCHC: 31.8 g/dL (ref 30.0–36.0)
MCV: 84.5 fL (ref 80.0–100.0)
NRBC: 0 % (ref 0.0–0.2)
PLATELETS: 476 10*3/uL — AB (ref 150–400)
RBC: 4.58 MIL/uL (ref 3.87–5.11)
RDW: 16.2 % — AB (ref 11.5–15.5)
WBC: 11.4 10*3/uL — AB (ref 4.0–10.5)

## 2018-08-11 LAB — BASIC METABOLIC PANEL
Anion gap: 8 (ref 5–15)
BUN: 12 mg/dL (ref 6–20)
CHLORIDE: 106 mmol/L (ref 98–111)
CO2: 26 mmol/L (ref 22–32)
CREATININE: 0.74 mg/dL (ref 0.44–1.00)
Calcium: 9.1 mg/dL (ref 8.9–10.3)
GFR calc Af Amer: >60 mL/min (ref >60)
Glucose, Bld: 99 mg/dL (ref 70–99)
Potassium: 3.9 mmol/L (ref 3.5–5.1)
SODIUM: 140 mmol/L (ref 135–145)

## 2018-08-11 LAB — MRSA PCR SCREENING: MRSA by PCR: NEGATIVE

## 2018-08-11 MED ORDER — SODIUM CHLORIDE 0.9% FLUSH
3.0000 mL | Freq: Two times a day (BID) | INTRAVENOUS | Status: DC
  Administered 2018-08-11 – 2018-08-15 (×7): 3 mL via INTRAVENOUS

## 2018-08-11 MED ORDER — NICOTINE 14 MG/24HR TD PT24
14.0000 mg | MEDICATED_PATCH | Freq: Every day | TRANSDERMAL | Status: DC
  Administered 2018-08-15: 14 mg via TRANSDERMAL
  Filled 2018-08-11 (×3): qty 1

## 2018-08-11 MED ORDER — OXYCODONE-ACETAMINOPHEN 5-325 MG PO TABS
1.0000 | ORAL_TABLET | Freq: Four times a day (QID) | ORAL | Status: DC | PRN
  Administered 2018-08-11 – 2018-08-12 (×2): 1 via ORAL
  Filled 2018-08-11 (×2): qty 1

## 2018-08-11 MED ORDER — SODIUM CHLORIDE 0.9% FLUSH
3.0000 mL | Freq: Two times a day (BID) | INTRAVENOUS | Status: DC
  Administered 2018-08-11 – 2018-08-15 (×4): 3 mL via INTRAVENOUS

## 2018-08-11 MED ORDER — BISACODYL 5 MG PO TBEC: 5.0000 mg | DELAYED_RELEASE_TABLET | Freq: Every day | ORAL | Status: DC | PRN

## 2018-08-11 MED ORDER — MORPHINE SULFATE (PF) 4 MG/ML IV SOLN: 4.0000 mg | INTRAVENOUS | Status: DC | PRN

## 2018-08-11 MED ORDER — SENNOSIDES-DOCUSATE SODIUM 8.6-50 MG PO TABS: 1.0000 | ORAL_TABLET | Freq: Every evening | ORAL | Status: DC | PRN

## 2018-08-11 MED ORDER — HYDRALAZINE HCL 20 MG/ML IJ SOLN
10.0000 mg | Freq: Four times a day (QID) | INTRAMUSCULAR | Status: DC | PRN
  Administered 2018-08-13: 10 mg via INTRAVENOUS
  Filled 2018-08-11 (×2): qty 1

## 2018-08-11 MED ORDER — LOSARTAN POTASSIUM 50 MG PO TABS
100.0000 mg | ORAL_TABLET | Freq: Every day | ORAL | Status: DC
  Administered 2018-08-11 – 2018-08-15 (×5): 100 mg via ORAL
  Filled 2018-08-11 (×5): qty 2
  Filled 2018-08-11: qty 4

## 2018-08-11 MED ORDER — ACETAMINOPHEN 325 MG PO TABS
650.0000 mg | ORAL_TABLET | Freq: Four times a day (QID) | ORAL | Status: DC | PRN
  Administered 2018-08-13: 650 mg via ORAL
  Filled 2018-08-11: qty 2

## 2018-08-11 MED ORDER — METOPROLOL SUCCINATE ER 50 MG PO TB24
50.0000 mg | ORAL_TABLET | Freq: Every day | ORAL | Status: DC
  Administered 2018-08-11: 50 mg via ORAL
  Filled 2018-08-11 (×2): qty 1

## 2018-08-11 NOTE — Care Management Note (Addendum)
Case Management Note  Patient Details  Name: Heather Mahoney MRN: [id number] Date of Birth: 1966/02/09  Subjective/Objective:     Patient admitted to the ICU for hypertensive crisis and AAA.  Pt presented to the ED with severe abdominal and pelvic pain.  AAA repair scheduled for Friday with vascular surgery.  Pt required nicardipine drip to control blood pressure but the nicardipine is off and the patient will transfer to the floor today.  Patient currently has no insurance.  She has not been taking her BP meds for 2 months because she cannot afford them, at home she should be taking metoprolol and losartan.  Patient does not have a PCP.  I gave the patient information on [facility] and [facility]ication [facility] she has the application and will complete it.  I made a referral to both [facility] and [facility].  I also printed out the current list of $4 medications available at [facility], losartan and metoprolol are both on the list.  I asked if she would be able to afford $4 meds and she says that there is no way that she can afford them.  She works at [employer] part time but is trying to pick up more hours so hopefully she will be able to get on with them full time.  She lives at home with her son Heather Mahoney who is 35y/o.  She is able to complete all ADL's independently.  She is able to drive so transportation is not a problem.  She also has a daughter.  She does not currently have a HCPOA but she states that her children would be her decision makers in the event that she is unable to do so.                  Action/Plan:   Expected Discharge Date:                  Expected Discharge Plan:  Home/Self Care  In-House Referral:     Discharge planning Services  CM Consult  Post Acute Care Choice:    Choice offered to:     DME Arranged:    DME Agency:     HH Arranged:    HH Agency:     Status of Service:  In process, will continue to follow  If discussed at Long Length of Stay Meetings, dates discussed:     Additional Comments:  Shelbie Hutching, RN 08/11/2018, 11:26 AM

## 2018-08-11 NOTE — Consult Note (Signed)
Name: Heather Mahoney MRN: [id number] DOB: 19-Aug-1966    ADMISSION DATE:  08/10/2018 CONSULTATION DATE:  08/11/18  REFERRING MD :  Dr. Jannifer Franklin  CHIEF COMPLAINT:  Pelvic pain  BRIEF PATIENT DESCRIPTION:  52 y.o. Female with hypertensive emergency requiring Nicardipine drip in setting of med noncompliance and expanding AAA that is 4.1 cm x 3.8 cm (previously 3.3 cm)  Vascular surgery is consulted and following along.  SIGNIFICANT EVENTS  08/10/18>> Admission to [facility] Stepdown unit  STUDIES:  CT Abdomen & Pelvis 08/10/18>> There is atherosclerotic calcification of the abdominal aorta. Infrarenal abdominal aortic aneurysm contains mural thrombus and measures 4.1 x 3.8 centimeters, previously 3.3 centimeters.   HISTORY OF PRESENT ILLNESS:   Heather Mahoney is a 52 y.o. Female with a PMH of HTN, HLD, heart murmur, GERD, and hiatal hernia who presents to [facility] ED on 08/10/18 with c/o abdominal pain.  She reports that the abdominal pain was abrupt in onset on 10/22 and described it as severe, sharp, and stabbing in the left lower quadrant.  She denied nausea, vomiting, diarrhea, constipation, vaginal discharge, dysuria, hematuria, or chest pain.  On physical exam she was noted to have a tender, pulsatile abdominal mass concerning for AAA.  CT Abdomen and Pelvis was performed which revealed an infrarenal AAA that was 4.1 x 3.8 cm (previously 3.3 cm).  Upon presentation, pt was also noted to have significantly elevated Hypertension with BP 211/111. Pt reports that she has been noncompliant with her home Metoprolol and Losartan for approximately 2 months.  UA was negative for UTI.  She is being admitted to [facility] unit for treatment of Hypertensive emergency requiring Nicardipine drip and expanding AAA.  PCCM is consulted for further management.  PAST MEDICAL HISTORY :   has a past medical history of GERD (gastroesophageal reflux disease), Heart murmur, Hiatal hernia, Hyperlipidemia, and  Hypertension.  has a past surgical history that includes Breast surgery (1986) and Salpingoophorectomy (Left, 2000). Prior to Admission medications   Medication Sig Start Date End Date Taking? Authorizing Provider  albuterol (PROVENTIL HFA;VENTOLIN HFA) 108 (90 Base) MCG/ACT inhaler Inhale 2 puffs into the lungs every 6 (six) hours as needed for wheezing or shortness of breath. 01/11/16  Yes Lavonia Drafts, MD  atorvastatin (LIPITOR) 20 MG tablet Take 1 tablet (20 mg total) by mouth daily. 12/18/17  Yes Gollan, Kathlene November, MD  ibuprofen (ADVIL,MOTRIN) 600 MG tablet Take 1 tablet (600 mg total) by mouth every 8 (eight) hours as needed. 01/05/18  Yes Sable Feil, PA-C  losartan (COZAAR) 100 MG tablet Take 1 tablet (100 mg total) by mouth daily. 12/18/17  Yes Minna Merritts, MD  metoprolol succinate (TOPROL-XL) 50 MG 24 hr tablet Take 1 tablet (50 mg total) by mouth daily. 12/18/17  Yes Minna Merritts, MD  naproxen sodium (ALEVE) 220 MG tablet Take 220 mg by mouth 2 (two) times daily as needed (pain).   Yes [provider]  brompheniramine-pseudoephedrine-DM 30-2-10 MG/5ML syrup Take 5 mLs by mouth 4 (four) times daily as needed. Patient not taking: Reported on 08/10/2018 01/05/18   Sable Feil, PA-C   Allergies  Allergen Reactions  . Strawberry Extract Hives and Swelling  . Strawberry Flavor Rash    FAMILY HISTORY:  family history includes Asthma in her mother; Diabetes in her maternal grandmother and mother; Heart attack in her mother; Heart disease in her maternal grandmother and mother; Hyperlipidemia in her maternal grandmother and mother; Hypertension in her mother; Sarcoidosis in her  mother. SOCIAL HISTORY:  reports that she has been smoking cigarettes. She has a 9.50 pack-year smoking history. She has never used smokeless tobacco. She reports that she drinks alcohol. She reports that she has current or past drug history. Drug: Marijuana.  REVIEW OF SYSTEMS:  Positives in  BOLD Constitutional: Negative for fever, chills, weight loss, malaise/fatigue and diaphoresis.  HENT: Negative for hearing loss, ear pain, nosebleeds, congestion, sore throat, neck pain, tinnitus and ear discharge.   Eyes: Negative for blurred vision, double vision, photophobia, pain, discharge and redness.  Respiratory: Negative for cough, hemoptysis, sputum production, shortness of breath, wheezing and stridor.   Cardiovascular: Negative for chest pain, palpitations, orthopnea, claudication, leg swelling and PND.  Gastrointestinal: Negative for heartburn, nausea, vomiting, +abdominal pain, diarrhea, constipation, blood in stool and melena.  Genitourinary: Negative for dysuria, urgency, frequency, hematuria and flank pain.  Musculoskeletal: Negative for myalgias, back pain, joint pain and falls.  Skin: Negative for itching and rash.  Neurological: Negative for dizziness, tingling, tremors, sensory change, speech change, focal weakness, seizures, loss of consciousness, weakness and headaches.  Endo/Heme/Allergies: Negative for environmental allergies and polydipsia. Does not bruise/bleed easily.  SUBJECTIVE:  Reports that her abdominal pain is minimal and only experiences it with standing Denies SOB, chest pain, LE pain Denies fever, chills On room air Wants to drink some water  VITAL SIGNS: Temp:  [98.1 F (36.7 C)] 98.1 F (36.7 C) (10/22 1556) Pulse Rate:  [58-82] 72 (10/22 2215) Resp:  [13-16] 15 (10/22 2215) BP: (125-211)/(82-111) 125/89 (10/22 2215) SpO2:  [95 %-100 %] 95 % (10/22 2215) Weight:  [68 kg] 68 kg (10/22 1557)  PHYSICAL EXAMINATION: General:  Acutely ill appearing female, laying in bed, on room air, in NAD Neuro:  Awake, A&O x4, no focal deficits, speech clear HEENT:  Atraumatic, normocephalic, neck supple, no JVD Cardiovascular:  RRR, s1s2, no M/R/G Lungs:  Clear bilaterally, even, nonlabored, normal effort Abdomen:  Soft, tender to palpation, nondistended, BS+  x4 Musculoskeletal:  No deformities, normal bulk and tone, no edema Skin:  Warm/dry.  No obvious rashes, lesions, or ulcerations  Recent Labs  Lab 08/10/18 1607  NA 140  K 3.6  CL 104  CO2 27  BUN 13  CREATININE 0.79  GLUCOSE 130*   Recent Labs  Lab 08/10/18 1607  HGB 11.9*  HCT 36.4  WBC 11.6*  PLT 439*   Ct Abdomen Pelvis W Contrast  Result Date: 08/10/2018 CLINICAL DATA:  Pt c/o pelvic pain. More on L side. Has had an ovary removed but unsure which side. Denies bleeding or discharge. Pain x 2 days. Denies NANDVANDDANDfever. Still has appendix, gallbladder. States urinary frequency. Denies back pain EXAM: CT ABDOMEN AND PELVIS WITH CONTRAST TECHNIQUE: Multidetector CT imaging of the abdomen and pelvis was performed using the standard protocol following bolus administration of intravenous contrast. CONTRAST:  19mL ISOVUE-300 IOPAMIDOL (ISOVUE-300) INJECTION 61% COMPARISON:  CT of the abdomen and pelvis on 11/11/2016 FINDINGS: Lower chest: No acute abnormality. Hepatobiliary: The liver is homogeneous. No focal liver lesions. Gallbladder is present and normal in CT appearance. Pancreas: Unremarkable. No pancreatic ductal dilatation or surrounding inflammatory changes. Spleen: Normal in size without focal abnormality. Adrenals/Urinary Tract: The adrenal glands are normal in appearance. No hydronephrosis or renal mass. No ureteral obstruction. Urinary bladder is decompressed. Stomach/Bowel: The stomach and small bowel loops are normal in appearance. The appendix is well seen and has a normal appearance. Moderate stool burden in otherwise normal appearing loops of large bowel. Vascular/Lymphatic: There  is atherosclerotic calcification of the abdominal aorta. Infrarenal abdominal aortic aneurysm contains mural thrombus and measures 4.1 x 3.8 centimeters, previously 3.3 centimeters. Reproductive: The uterus is present.  No adnexal mass. Other: No free pelvic fluid. Small fat containing  supraumbilical hernia. Musculoskeletal: No acute or significant osseous findings. IMPRESSION: 1. Large stool burden. 2. Increased size of infrarenal abdominal aortic aneurysm, now 4.1 centimeters. Recommend followup by ultrasound in 1 year. This recommendation follows ACR consensus guidelines: White Paper of the ACR Incidental Findings Committee II on Vascular Findings. J Am Coll Radiol 2013; 37:048-889. 3. Aortic Atherosclerosis (ICD10-I70.0). Aortic aneurysm NOS (ICD10-I71.9). Electronically Signed   By: Nolon Nations M.D.   On: 08/10/2018 19:23    ASSESSMENT / PLAN:  A: -Symptomatic AAA -Hypertensive Emergency in setting of noncompliance with home Antihypertensives -Hyperlipidemia -Anemia without signs of bleeding -Hx: HTN, HLD, GERD, Heart murmur, Hiatal hernia P: -Cardiac monitoring -Nicardipine gtt to maintain BP < 140/90 -PO Losartan, metoprolol -Vascular surgery consulted, appreciate input -Per Vascular surgery, plan for possible endovascular repair on 08/13/18 -Continue Lipitor -Monitor for s/sx of bleeding -Trend CBC -Transfuse for Hgb <7   DISPOSITION: Stepdown GOALS OF CARE: Full code VTE PROPHYLAXIS: Lovenox UPDATES: Updated pt at bedside 08/11/18  Darel Hong, AGACNP-BC [facility] Pager: [phone]   08/11/2018, 12:41 AM

## 2018-08-11 NOTE — Progress Notes (Signed)
Pt being transferred to room 250. Report called to [place], Therapist, sports. Pt and belongings transferred to room 250 without incident.

## 2018-08-11 NOTE — Progress Notes (Signed)
[facility] [place] NAME: Heather Mahoney    MR#:  [id number]  DATE OF BIRTH:  [birth year]  SUBJECTIVE:  CHIEF COMPLAINT:   Chief Complaint  Patient presents with  . Pelvic Pain   Much better abdominal pain.  Off the Cardene drip. REVIEW OF SYSTEMS:  Review of Systems  Constitutional: Negative for chills, fever and malaise/fatigue.  HENT: Negative for sore throat.   Eyes: Negative for blurred vision and double vision.  Respiratory: Negative for cough, hemoptysis, shortness of breath, wheezing and stridor.   Cardiovascular: Negative for chest pain, palpitations, orthopnea and leg swelling.  Gastrointestinal: Negative for abdominal pain, blood in stool, diarrhea, melena, nausea and vomiting.  Genitourinary: Negative for dysuria, flank pain and hematuria.  Musculoskeletal: Negative for back pain and joint pain.  Skin: Negative for rash.  Neurological: Negative for dizziness, sensory change, focal weakness, seizures, loss of consciousness, weakness and headaches.  Endo/Heme/Allergies: Negative for polydipsia.  Psychiatric/Behavioral: Negative for depression. The patient is not nervous/anxious.     DRUG ALLERGIES:   Allergies  Allergen Reactions  . Strawberry Extract Hives and Swelling  . Strawberry Flavor Rash   VITALS:  Blood pressure (!) 150/96, pulse 60, temperature 97.8 F (36.6 C), temperature source Oral, resp. rate 13, height 5\' 7"  (1.702 m), weight 65.3 kg, SpO2 100 %. PHYSICAL EXAMINATION:  Physical Exam  Constitutional: She is oriented to person, place, and time. No distress.  HENT:  Head: Normocephalic.  Mouth/Throat: Oropharynx is clear and moist.  Eyes: Pupils are equal, round, and reactive to light. Conjunctivae and EOM are normal. No scleral icterus.  Neck: Normal range of motion. Neck supple. No JVD present. No tracheal deviation present.  Cardiovascular: Normal rate, regular rhythm and normal heart sounds. Exam  reveals no gallop.  No murmur heard. Pulmonary/Chest: Effort normal and breath sounds normal. No respiratory distress. She has no wheezes. She has no rales.  Abdominal: Soft. Bowel sounds are normal. She exhibits no distension. There is no tenderness. There is no rebound.  Musculoskeletal: Normal range of motion. She exhibits no edema or tenderness.  Neurological: She is alert and oriented to person, place, and time. No cranial nerve deficit.  Skin: No rash noted. No erythema.  Psychiatric: She has a normal mood and affect.   LABORATORY PANEL:  Female CBC Recent Labs  Lab 08/11/18 0455  WBC 11.4*  HGB 12.3  HCT 38.7  PLT 476*   ------------------------------------------------------------------------------------------------------------------ Chemistries  Recent Labs  Lab 08/11/18 0455  NA 140  K 3.9  CL 106  CO2 26  GLUCOSE 99  BUN 12  CREATININE 0.74  CALCIUM 9.1   RADIOLOGY:  Ct Abdomen Pelvis W Contrast  Result Date: 08/10/2018 CLINICAL DATA:  Pt c/o pelvic pain. More on L side. Has had an ovary removed but unsure which side. Denies bleeding or discharge. Pain x 2 days. Denies NANDVANDDANDfever. Still has appendix, gallbladder. States urinary frequency. Denies back pain EXAM: CT ABDOMEN AND PELVIS WITH CONTRAST TECHNIQUE: Multidetector CT imaging of the abdomen and pelvis was performed using the standard protocol following bolus administration of intravenous contrast. CONTRAST:  160mL ISOVUE-300 IOPAMIDOL (ISOVUE-300) INJECTION 61% COMPARISON:  CT of the abdomen and pelvis on 11/11/2016 FINDINGS: Lower chest: No acute abnormality. Hepatobiliary: The liver is homogeneous. No focal liver lesions. Gallbladder is present and normal in CT appearance. Pancreas: Unremarkable. No pancreatic ductal dilatation or surrounding inflammatory changes. Spleen: Normal in size without focal abnormality. Adrenals/Urinary Tract: The adrenal glands  are normal in appearance. No hydronephrosis or  renal mass. No ureteral obstruction. Urinary bladder is decompressed. Stomach/Bowel: The stomach and small bowel loops are normal in appearance. The appendix is well seen and has a normal appearance. Moderate stool burden in otherwise normal appearing loops of large bowel. Vascular/Lymphatic: There is atherosclerotic calcification of the abdominal aorta. Infrarenal abdominal aortic aneurysm contains mural thrombus and measures 4.1 x 3.8 centimeters, previously 3.3 centimeters. Reproductive: The uterus is present.  No adnexal mass. Other: No free pelvic fluid. Small fat containing supraumbilical hernia. Musculoskeletal: No acute or significant osseous findings. IMPRESSION: 1. Large stool burden. 2. Increased size of infrarenal abdominal aortic aneurysm, now 4.1 centimeters. Recommend followup by ultrasound in 1 year. This recommendation follows ACR consensus guidelines: White Paper of the ACR Incidental Findings Committee II on Vascular Findings. J Am Coll Radiol 2013; 25:956-387. 3. Aortic Atherosclerosis (ICD10-I70.0). Aortic aneurysm NOS (ICD10-I71.9). Electronically Signed   By: Nolon Nations M.D.   On: 08/10/2018 19:23   ASSESSMENT AND PLAN:   AAA (abdominal aortic aneurysm) [facility]):   AAA repair on October 25.  Hypertension malignancy. Off nicardipine gtt, restarted home medications to eep BP < 140/90.  Hyperlipidemia: home medication continued.  Chronic thrombocytopenia.  Unclear etiology.  Stable.  Tobacco abuse.  Smoking cessation was counseled for 3 to 4 minutes.  The patient wants to quit. Patch.  All the records are reviewed and case discussed with Care Management/Social Worker. Management plans discussed with the patient, family and they are in agreement.  CODE STATUS: Full Code  TOTAL TIME TAKING CARE OF THIS PATIENT: 32 minutes.   More than 50% of the time was spent in counseling/coordination of care: YES  POSSIBLE D/C IN 3 DAYS, DEPENDING ON CLINICAL CONDITION.   Demetrios Loll M.D on 08/11/2018 at 3:43 PM  Between 7am to 6pm - Pager - [phone]  After 6pm go to [email or web address] - [employer] [facility]

## 2018-08-11 NOTE — Progress Notes (Signed)
Advanced Care Plan.  Purpose of Encounter: CODE STATUS. Parties in Attendance: The patient and me. Patient's Decisional Capacity: Yes. Medical Story: Heather Mahoney  is a 52 y.o. female  with history of AAA, hypertension, hyperlipidemia and GERD.  The patient is admitted to ICU for worsening AAA and hypertension malignancy.  She has been treated with Cardene drip.  I discussed with patient about patient's current condition, high risk for AAA rupture, prognosis and CODE STATUS.  The patient wants to be resuscitated and intubated if she has cardiopulmonary arrest. Plan:  Code Status: Full code. Time spent discussing advance care planning: 17 minutes.

## 2018-08-12 ENCOUNTER — Encounter: Payer: Self-pay | Admitting: Student

## 2018-08-12 DIAGNOSIS — I700 Atherosclerosis of aorta: Secondary | ICD-10-CM

## 2018-08-12 DIAGNOSIS — E782 Mixed hyperlipidemia: Secondary | ICD-10-CM

## 2018-08-12 DIAGNOSIS — R109 Unspecified abdominal pain: Secondary | ICD-10-CM

## 2018-08-12 DIAGNOSIS — I10 Essential (primary) hypertension: Secondary | ICD-10-CM

## 2018-08-12 DIAGNOSIS — F17200 Nicotine dependence, unspecified, uncomplicated: Secondary | ICD-10-CM

## 2018-08-12 LAB — HIV ANTIBODY (ROUTINE TESTING W REFLEX): HIV Screen 4th Generation wRfx: NONREACTIVE

## 2018-08-12 MED ORDER — METOPROLOL SUCCINATE ER 25 MG PO TB24: 25.0000 mg | ORAL_TABLET | Freq: Every day | ORAL | Status: DC

## 2018-08-12 MED ORDER — PHENOL 1.4 % MT LIQD
1.0000 | OROMUCOSAL | Status: DC | PRN
  Administered 2018-08-12 (×2): 1 via OROMUCOSAL
  Filled 2018-08-12: qty 177

## 2018-08-12 MED ORDER — AMLODIPINE BESYLATE 5 MG PO TABS
5.0000 mg | ORAL_TABLET | Freq: Every day | ORAL | Status: DC
  Administered 2018-08-12: 5 mg via ORAL
  Filled 2018-08-12: qty 1

## 2018-08-12 MED ORDER — METOPROLOL SUCCINATE ER 50 MG PO TB24: 50.0000 mg | ORAL_TABLET | Freq: Every day | ORAL | Status: DC

## 2018-08-12 MED ORDER — MELATONIN 5 MG PO TABS
5.0000 mg | ORAL_TABLET | Freq: Every evening | ORAL | Status: DC | PRN
  Administered 2018-08-13: 5 mg via ORAL
  Filled 2018-08-12 (×3): qty 1

## 2018-08-12 MED ORDER — METOPROLOL SUCCINATE ER 25 MG PO TB24
25.0000 mg | ORAL_TABLET | Freq: Every day | ORAL | Status: DC
  Administered 2018-08-13 – 2018-08-15 (×3): 25 mg via ORAL
  Filled 2018-08-12 (×3): qty 1

## 2018-08-12 MED ORDER — CEFAZOLIN SODIUM-DEXTROSE 2-4 GM/100ML-% IV SOLN
2.0000 g | INTRAVENOUS | Status: AC
  Administered 2018-08-13: 2 g via INTRAVENOUS
  Filled 2018-08-12: qty 100

## 2018-08-12 MED ORDER — NON FORMULARY: 3.0000 mg | Freq: Every evening | Status: DC | PRN

## 2018-08-12 MED ORDER — SODIUM CHLORIDE 0.9 % IV SOLN
INTRAVENOUS | Status: DC
  Administered 2018-08-13 (×2): via INTRAVENOUS
  Administered 2018-08-14: 75 mL/h via INTRAVENOUS

## 2018-08-12 NOTE — Progress Notes (Addendum)
[place] [facility] [place] NAME: Heather Mahoney    MR#:  [id number]  DATE OF BIRTH:  11-21-1965  SUBJECTIVE:  CHIEF COMPLAINT:   Chief Complaint  Patient presents with  . Pelvic Pain   No abdominal pain. REVIEW OF SYSTEMS:  Review of Systems  Constitutional: Negative for chills, fever and malaise/fatigue.  HENT: Negative for sore throat.   Eyes: Negative for blurred vision and double vision.  Respiratory: Negative for cough, hemoptysis, shortness of breath, wheezing and stridor.   Cardiovascular: Negative for chest pain, palpitations, orthopnea and leg swelling.  Gastrointestinal: Negative for abdominal pain, blood in stool, diarrhea, melena, nausea and vomiting.  Genitourinary: Negative for dysuria, flank pain and hematuria.  Musculoskeletal: Negative for back pain and joint pain.  Skin: Negative for rash.  Neurological: Negative for dizziness, sensory change, focal weakness, seizures, loss of consciousness, weakness and headaches.  Endo/Heme/Allergies: Negative for polydipsia.  Psychiatric/Behavioral: Negative for depression. The patient is not nervous/anxious.     DRUG ALLERGIES:   Allergies  Allergen Reactions  . Strawberry Extract Hives and Swelling  . Strawberry Flavor Rash   VITALS:  Blood pressure (!) 153/97, pulse (!) 52, temperature 97.7 F (36.5 C), temperature source Oral, resp. rate 15, height 5\' 7"  (1.702 m), weight 65.3 kg, SpO2 100 %. PHYSICAL EXAMINATION:  Physical Exam  Constitutional: She is oriented to person, place, and time. No distress.  HENT:  Head: Normocephalic.  Mouth/Throat: Oropharynx is clear and moist.  Eyes: Pupils are equal, round, and reactive to light. Conjunctivae and EOM are normal. No scleral icterus.  Neck: Normal range of motion. Neck supple. No JVD present. No tracheal deviation present.  Cardiovascular: Normal rate, regular rhythm and normal heart sounds. Exam reveals no gallop.  No murmur  heard. Pulmonary/Chest: Effort normal and breath sounds normal. No respiratory distress. She has no wheezes. She has no rales.  Abdominal: Soft. Bowel sounds are normal. She exhibits no distension. There is no tenderness. There is no rebound.  Musculoskeletal: Normal range of motion. She exhibits no edema or tenderness.  Neurological: She is alert and oriented to person, place, and time. No cranial nerve deficit.  Skin: No rash noted. No erythema.  Psychiatric: She has a normal mood and affect.   LABORATORY PANEL:  Female CBC Recent Labs  Lab 08/11/18 0455  WBC 11.4*  HGB 12.3  HCT 38.7  PLT 476*   ------------------------------------------------------------------------------------------------------------------ Chemistries  Recent Labs  Lab 08/11/18 0455  NA 140  K 3.9  CL 106  CO2 26  GLUCOSE 99  BUN 12  CREATININE 0.74  CALCIUM 9.1   RADIOLOGY:  No results found. ASSESSMENT AND PLAN:   AAA (abdominal aortic aneurysm) [facility]):   AAA repair tomorrow.  Low risk for AAA repair surgery per Dr. Rockey Situ.  Hypertension malignancy. Off nicardipine gtt, continue lopressor and losartan, add norvasc, IV hydralazine.  Hyperlipidemia: home medication continued.  Chronic thrombocytopenia.  Unclear etiology.  Stable.  Tobacco abuse.  Smoking cessation was counseled for 3 to 4 minutes.  The patient wants to quit. Patch.  Discussed with Dr. Rockey Situ. All the records are reviewed and case discussed with Care Management/Social Worker. Management plans discussed with the patient, family and they are in agreement.  CODE STATUS: Full Code  TOTAL TIME TAKING CARE OF THIS PATIENT: 26 minutes.   More than 50% of the time was spent in counseling/coordination of care: YES  POSSIBLE D/C IN 2 DAYS, DEPENDING ON CLINICAL CONDITION.  Demetrios Loll M.D on 08/12/2018 at 1:58 PM  Between 7am to 6pm - Pager - [phone]  After 6pm go to [email or web address] - Research scientist (physical sciences) Hospitalists

## 2018-08-12 NOTE — Consult Note (Addendum)
Cardiology Consult    Patient ID: Keven CLOIS MONTAVON MRN: [id number], DOB/AGE: Mar 07, 1966   Admit date: 08/10/2018 Date of Consult: 08/12/2018  Primary Physician: Patient, No Pcp Per Primary Cardiologist: Ida Rogue, MD Requesting Provider: Katha Cabal, MD  Patient Profile    Heather Mahoney is a 52 y.o. female with a history of AAA, HTN with previous emergency room visits for hypertensive urgency, HLD, GERD, PVCs and 40 pack-year tobacco smoking history who is being seen today for preoperative evaluation for AAA repair at the request of Dr. Delana Meyer.  Past Medical History   Past Medical History:  Diagnosis Date  . GERD (gastroesophageal reflux disease)   . Heart murmur   . Hiatal hernia   . Hyperlipidemia   . Hypertension    On Hydrochlorothiazide  . Tobacco abuse    a. Started at age 20, quit during 3 pregnancies. b. 1 PPD for a 40 pack-year hx (2019)     Past Surgical History:  Procedure Laterality Date  . BREAST SURGERY  1986   I&D for 'milk duct'  . SALPINGOOPHORECTOMY Left 2000     Allergies  Allergies  Allergen Reactions  . Strawberry Extract Hives and Swelling  . Strawberry Flavor Rash    History of Present Illness     Heather Mahoney is a 52 y.o. female with the above noted PMH.  She is not managed by a PCP and has a history of HTN, and multiple episodes of hypertensive urgency warranting emergency room management. She also has a h/o abd pain and has been evaluated w/ CT's in the past.  In 03/2011 CT abd/pelvis showed no abdominal aortic abnormalities, however, in 10/2016, repeat CT abd/pelvis w/ contrast, again in the setting of abd pain, showed incidental finding of 3.3 cm AAA.   She has a history of chest pain with a stress test in 01/2016 showing no significant ischemia and normal wall motion.  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 She states that she frequently misses  doses of her blood pressure medication because often she feels like she can't afford it. She reports last year she had multiple episodes of calf pain while walking, but she has not experienced any recently.  She reports frequent episodes of chest pain while walking. She can't say how long it takes before it will come on but that sometimes she can walk into work from the parking lot without a problem and sometimes she has to stop. She states that the episodes last less than 3 minutes normally but if she has smoked recently they will last longer, but still less than 20 mins. They are relieved by rest. She also gets shortness of breath when walking that she thinks is more likely due to her smoking. She denies DM2 or previous MI.  She presented the emergency room on 10/22 for LLQ abdominal pain. She was also noted to have SBP >200. A pulsatile abdominal mass was noted and CT of the abdomen and pelvis showed that her AAA had increased in size to 4.1 cm. She was admitted to the ICU with a nicardapine drip. She responded well and was transferred to tele unit. She was then seen by Dr. Delana Meyer, vascular surgeon, for evaluation who measured the aneurysm at 4.9 cm and in the setting of rapid growth and HTN he recommended endovascular repair, tentatively scheduled for 10/25. He requested that cardiology assess the patient for  cardiac risk.  Risk Assessment: The patient currently has HTN, HLD with elevated LDL in 2017 being treated with Atorvastatin, she is a smoker, post-menopausal status and has a strong family history of CAD and DM2. She currently demonstrates no sxs of ACS. She denies DM2.  Her current capacity for activity is stable as she walks regularly, with a functional capacity score of 21.2 and 5.35 METS per [facility].   Inpatient Medications    . atorvastatin  20 mg Oral Daily  . Influenza vac split quadrivalent PF  0.5 mL Intramuscular Tomorrow-1000  . losartan  100 mg Oral Daily  . [START ON 08/13/2018]  metoprolol succinate  25 mg Oral Daily  . nicotine  14 mg Transdermal Daily  . sodium chloride flush  3 mL Intravenous Q12H  . sodium chloride flush  3 mL Intravenous Q12H    Family History    Family History  Problem Relation Age of Onset  . Diabetes Mother   . Hypertension Mother   . Hyperlipidemia Mother   . Heart disease Mother        CABG X 4  . Asthma Mother   . Sarcoidosis Mother        In remission  . Heart attack Mother   . Diabetes Maternal Grandmother   . Heart disease Maternal Grandmother   . Hyperlipidemia Maternal Grandmother    She indicated that her mother is alive. She indicated that her father is deceased. She indicated that all of her three sisters are alive. She indicated that her brother is alive. She indicated that her maternal grandmother is alive.   Social History    Social History   Socioeconomic History  . Marital status: Married    Spouse name: Not on file  . Number of children: Not on file  . Years of education: Not on file  . Highest education level: Not on file  Occupational History  . Not on file  Social Needs  . Financial resource strain: Not on file  . Food insecurity:    Worry: Not on file    Inability: Not on file  . Transportation needs:    Medical: Not on file    Non-medical: Not on file  Tobacco Use  . Smoking status: Current Every Day Smoker    Packs/day: 0.25    Years: 38.00    Pack years: 9.50    Types: Cigarettes  . Smokeless tobacco: Never Used  Substance and Sexual Activity  . Alcohol use: Yes    Comment: occassional  . Drug use: Yes    Types: Marijuana    Comment: smokes Marijuana 'when I cant sleep, maybe once or twice a week'  . Sexual activity: Yes  Lifestyle  . Physical activity:    Days per week: Not on file    Minutes per session: Not on file  . Stress: Not on file  Relationships  . Social connections:    Talks on phone: Not on file    Gets together: Not on file    Attends religious service: Not on  file    Active member of club or organization: Not on file    Attends meetings of clubs or organizations: Not on file    Relationship status: Not on file  . Intimate partner violence:    Fear of current or ex partner: Not on file    Emotionally abused: Not on file    Physically abused: Not on file    Forced sexual activity: Not on  file  Other Topics Concern  . Not on file  Social History Narrative  . Not on file     Review of Systems    Review of Systems  Constitutional: Negative for chills, diaphoresis, fever, malaise/fatigue and weight loss.  HENT: Positive for sore throat.   Respiratory: Positive for shortness of breath. Negative for wheezing.   Cardiovascular: Positive for chest pain (exertional) and claudication (past hx, no recent sxs). Negative for palpitations, orthopnea, leg swelling and PND.  Gastrointestinal: Positive for abdominal pain. Negative for constipation, diarrhea, nausea and vomiting.  Musculoskeletal: Negative for neck pain.  Skin: Negative for itching and rash.  Neurological: Negative for dizziness, loss of consciousness, weakness and headaches.    All other systems reviewed and are otherwise negative except as noted above.  Physical Exam   Blood pressure (!) 153/97, pulse (!) 52, temperature 97.7 F (36.5 C), temperature source Oral, resp. rate 15, height 5\' 7"  (1.702 m), weight 65.3 kg, SpO2 100 %.  Physical Exam  Constitutional: She is oriented to person, place, and time. She appears well-developed and well-nourished. No distress.  HENT:  Head: Normocephalic and atraumatic.  Eyes: Pupils are equal, round, and reactive to light.  Neck: Normal range of motion. Neck supple. No JVD present.  Cardiovascular: Normal rate and regular rhythm. Exam reveals no gallop and no friction rub.  Murmur (2/6 intermittent systolic murmur, chronic according to pt) heard. Normal S1/S2, radial pulses 2+ and symmetrical, R PT 1+, L PT 2+, Capillary refill in fingers and  toes <3 seconds bilaterally  Pulmonary/Chest: Effort normal and breath sounds normal. No respiratory distress. She has no wheezes. She has no rales. She exhibits no tenderness.  Abdominal: Soft. She exhibits no distension. There is no tenderness. There is no rebound and no guarding.  Musculoskeletal: Normal range of motion. She exhibits no edema or tenderness.  Neurological: She is alert and oriented to person, place, and time.  Skin: Skin is warm and dry.  Psychiatric: She has a normal mood and affect. Her behavior is normal.     Labs    Lab Results  Component Value Date   WBC 11.4 (H) 08/11/2018   HGB 12.3 08/11/2018   HCT 38.7 08/11/2018   MCV 84.5 08/11/2018   PLT 476 (H) 08/11/2018    Recent Labs  Lab 08/11/18 0455  NA 140  K 3.9  CL 106  CO2 26  BUN 12  CREATININE 0.74  CALCIUM 9.1  GLUCOSE 99   Lab Results  Component Value Date   CHOL 226 (H) 01/11/2016   HDL 51 01/11/2016   LDLCALC 158 (H) 01/11/2016   TRIG 85 01/11/2016     Radiology Studies    Ct Abdomen Pelvis W Contrast  Result Date: 08/10/2018 CLINICAL DATA:  Pt c/o pelvic pain. More on L side. Has had an ovary removed but unsure which side. Denies bleeding or discharge. Pain x 2 days. Denies NANDVANDDANDfever. Still has appendix, gallbladder. States urinary frequency. Denies back pain EXAM: CT ABDOMEN AND PELVIS WITH CONTRAST TECHNIQUE: Multidetector CT imaging of the abdomen and pelvis was performed using the standard protocol following bolus administration of intravenous contrast. CONTRAST:  155mL ISOVUE-300 IOPAMIDOL (ISOVUE-300) INJECTION 61% COMPARISON:  CT of the abdomen and pelvis on 11/11/2016 FINDINGS: Lower chest: No acute abnormality. Hepatobiliary: The liver is homogeneous. No focal liver lesions. Gallbladder is present and normal in CT appearance. Pancreas: Unremarkable. No pancreatic ductal dilatation or surrounding inflammatory changes. Spleen: Normal in size without focal abnormality.  Adrenals/Urinary Tract: The adrenal glands are normal in appearance. No hydronephrosis or renal mass. No ureteral obstruction. Urinary bladder is decompressed. Stomach/Bowel: The stomach and small bowel loops are normal in appearance. The appendix is well seen and has a normal appearance. Moderate stool burden in otherwise normal appearing loops of large bowel. Vascular/Lymphatic: There is atherosclerotic calcification of the abdominal aorta. Infrarenal abdominal aortic aneurysm contains mural thrombus and measures 4.1 x 3.8 centimeters, previously 3.3 centimeters. Reproductive: The uterus is present.  No adnexal mass. Other: No free pelvic fluid. Small fat containing supraumbilical hernia. Musculoskeletal: No acute or significant osseous findings. IMPRESSION: 1. Large stool burden. 2. Increased size of infrarenal abdominal aortic aneurysm, now 4.1 centimeters. Recommend followup by ultrasound in 1 year. This recommendation follows ACR consensus guidelines: White Paper of the ACR Incidental Findings Committee II on Vascular Findings. J Am Coll Radiol 2013; 44:010-272. 3. Aortic Atherosclerosis (ICD10-I70.0). Aortic aneurysm NOS (ICD10-I71.9). Electronically Signed   By: Nolon Nations M.D.   On: 08/10/2018 19:23    ECG & Cardiac Imaging    N/A - personally reviewed.  Assessment & Plan    1. AAA/Pre-operative cardiovascular assessment: -Patient without prior cardiac history now pending endovascular repair of expanding AAA. Pt exhibits chest pain and dyspnea on exertion lasting 3-5 mins, similar to previous, with stress test in 2017 showing no significant findings.  She currently tolerates activity with 5.35 METS functional capacity.  Given her history, surgical risk for a cardiac event is 0.4%.  In that setting, she may proceed to the OR without any further cardiovascular/ischemic testing.  Cont  blocker and statin throughout the perioperative period.  With stable anginal Ss, she will need outpt  cardiology f/u.  2.  Hypertensive Emergency:   -Improved with addition of  blocker and ARB.  Cont to follow and adjust as necessary.  3.  HL: - LDL 158 in 12/2015.  22.5% of cardiac event in next 10 yrs.  Statin added.   Signed, Brenton Grills, PA-S 08/12/2018, 11:10 AM  For questions or updates, please contact   Please consult [email or web address] for contact info under Cardiology/STEMI.    Attending Note Patient seen and examined, agree with detailed note above,  Patient presentation and plan discussed on rounds.  Cardiology consult placed by Dr.    EKG lab work, chest x-ray, echocardiogram reviewed independently by myself  Heather Mahoney is a 52 y.o. female  Smoker GERD HTN PVCs Fall and question of loss of consciousness 2017, secondary to orthostasis,  Previously seen in cardiology clinic March 2019 for chest discomfort  Previously reported having chest discomfort in her 2019 in the setting of marked hypertension, headaches Work-up at that time detailed atypical symptoms, Running out of her medications Smoker Found to have visual field scotoma, decline pain to referral to neurology  She presents to the emergency room with left lower quadrant abdominal pain starting 2 days ago noted to have markedly elevated blood pressure Systolics in the 536U Pulsatile abdominal mass, CT scan abdomen pelvis showing 4 cm AAA Admitted to the ICU with nicardipine infusion with improved blood pressures Seen by vascular surgery, plan tomorrow for endovascular repair Rapid increase in size of her AAA over the past year from 3.3 cm to >4 cm  Denies any significant chest pain on exertion 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  Notes times with activity Etiology of her abdominal pain unclear, there was heavy stool burden on CT scan consistent with constipation  On physical exam well-appearing, no JVD, lungs clear to auscultation  bilaterally, heart sounds regular normal S1-S2 no murmurs appreciated abdomen soft nontender no significant lower extremity edema, musculoskeletal exam benign, neuro exam grossly nonfocal, she is alert and oriented  Lab work shows WBC 11.4, hematocrit 38, creatinine 0.74, potassium 3.9 Sodium 140  EKG March 2019 personally reviewed by myself showing Normal sinus rhythm rate 79 bpm nonspecific T wave abnormality in V6 Repeat EKG pending  A/P: Preop cardiovascular evaluation Planning for endograft placement for expanding AAA CT scans from 2018-2019 showing greater than 0.7 centimeter increase in 1 year Continues to smoke Seen by Dr. Ronalee Belts, vascular surgery, plan on procedure tomorrow Acceptable risk, no further testing needed Denies any anginal symptoms, very active, remodeling a [employer] in [place]  Hypertension, malignant Placed back on beta-blocker, ARB Blood pressure ranging between [phone] systolic off the nicardipine infusion Consider adding amlodipine 5 mg daily  Smoker We have encouraged her to continue to work on weaning her cigarettes and smoking cessation. She will continue to work on this and does not want any assistance with chantix.   Hyperlipidemia History of medication noncompliance Continue Lipitor  Aortic atherosclerosis Mild to moderate diffuse aortic athero-noted on descending aorta No coronary calcification seen on distal vessels  Long discussion concerning risk and benefit of the procedure, All questions answered Greater than 50% was spent in counseling and coordination of care with patient Total encounter time 110 minutes or more   Signed: Esmond Plants  M.D., Ph.D. [facility]

## 2018-08-12 NOTE — Progress Notes (Signed)
Complains of sore throat. Declines meds.  Hot tea and throat spray offered for relief.

## 2018-08-13 ENCOUNTER — Inpatient Hospital Stay: Payer: Self-pay | Admitting: Anesthesiology

## 2018-08-13 ENCOUNTER — Encounter: Payer: Self-pay | Admitting: Certified Registered Nurse Anesthetist

## 2018-08-13 ENCOUNTER — Encounter
Admission: EM | Disposition: A | Discharge status: Home / Self Care | Payer: Self-pay | Source: Home / Self Care | Attending: Internal Medicine

## 2018-08-13 DIAGNOSIS — I714 Abdominal aortic aneurysm, without rupture: Secondary | ICD-10-CM

## 2018-08-13 DIAGNOSIS — I998 Other disorder of circulatory system: Secondary | ICD-10-CM

## 2018-08-13 DIAGNOSIS — T8189XA Other complications of procedures, not elsewhere classified, initial encounter: Secondary | ICD-10-CM

## 2018-08-13 HISTORY — PX: ENDARTERECTOMY FEMORAL: SHX5804

## 2018-08-13 HISTORY — PX: EMBOLECTOMY: SHX44

## 2018-08-13 HISTORY — PX: ENDOVASCULAR STENT GRAFT (AAA): CATH118280

## 2018-08-13 LAB — TYPE AND SCREEN
ABO/RH(D): O POS
Antibody Screen: NEGATIVE

## 2018-08-13 LAB — URINE DRUG SCREEN, QUALITATIVE (ARMC ONLY)
AMPHETAMINES, UR SCREEN: NOT DETECTED
BENZODIAZEPINE, UR SCRN: POSITIVE — AB
Barbiturates, Ur Screen: NOT DETECTED
COCAINE METABOLITE, UR ~~LOC~~: NOT DETECTED
Cannabinoid 50 Ng, Ur ~~LOC~~: POSITIVE — AB
MDMA (ECSTASY) UR SCREEN: NOT DETECTED
Methadone Scn, Ur: NOT DETECTED
OPIATE, UR SCREEN: NOT DETECTED
PHENCYCLIDINE (PCP) UR S: NOT DETECTED
Tricyclic, Ur Screen: NOT DETECTED

## 2018-08-13 LAB — PROTIME-INR
INR: 1.03
Prothrombin Time: 13.4 s (ref 11.4–15.2)

## 2018-08-13 LAB — CBC
HCT: 35.9 % — ABNORMAL LOW (ref 36.0–46.0)
Hemoglobin: 11.8 g/dL — ABNORMAL LOW (ref 12.0–15.0)
MCH: 27.2 pg (ref 26.0–34.0)
MCHC: 32.9 g/dL (ref 30.0–36.0)
MCV: 82.7 fL (ref 80.0–100.0)
Platelets: 438 K/uL — ABNORMAL HIGH (ref 150–400)
RBC: 4.34 MIL/uL (ref 3.87–5.11)
RDW: 15.6 % — ABNORMAL HIGH (ref 11.5–15.5)
WBC: 10.2 K/uL (ref 4.0–10.5)
nRBC: 0 % (ref 0.0–0.2)

## 2018-08-13 LAB — BASIC METABOLIC PANEL
ANION GAP: 9 (ref 5–15)
BUN: 16 mg/dL (ref 6–20)
CALCIUM: 9.1 mg/dL (ref 8.9–10.3)
CO2: 25 mmol/L (ref 22–32)
Chloride: 105 mmol/L (ref 98–111)
Creatinine, Ser: 0.68 mg/dL (ref 0.44–1.00)
GFR calc Af Amer: >60 mL/min (ref >60)
Glucose, Bld: 104 mg/dL — ABNORMAL HIGH (ref 70–99)
POTASSIUM: 4.2 mmol/L (ref 3.5–5.1)
SODIUM: 139 mmol/L (ref 135–145)

## 2018-08-13 LAB — APTT: aPTT: 38 seconds — ABNORMAL HIGH (ref 24–36)

## 2018-08-13 LAB — MAGNESIUM: MAGNESIUM: 2.1 mg/dL (ref 1.7–2.4)

## 2018-08-13 SURGERY — ENDARTERECTOMY, FEMORAL
Anesthesia: General | Site: Leg Upper | Laterality: Right

## 2018-08-13 SURGERY — ENDOVASCULAR REPAIR/STENT GRAFT
Anesthesia: General

## 2018-08-13 MED ORDER — FENTANYL CITRATE (PF) 250 MCG/5ML IJ SOLN
INTRAMUSCULAR | Status: AC
  Filled 2018-08-13: qty 5

## 2018-08-13 MED ORDER — ONDANSETRON HCL 4 MG/2ML IJ SOLN
INTRAMUSCULAR | Status: AC
  Filled 2018-08-13: qty 2

## 2018-08-13 MED ORDER — ROCURONIUM BROMIDE 50 MG/5ML IV SOLN
INTRAVENOUS | Status: AC
  Filled 2018-08-13: qty 1

## 2018-08-13 MED ORDER — HEPARIN SODIUM (PORCINE) 1000 UNIT/ML IJ SOLN
INTRAMUSCULAR | Status: AC
  Filled 2018-08-13: qty 1

## 2018-08-13 MED ORDER — HEPARIN SODIUM (PORCINE) 1000 UNIT/ML IJ SOLN
INTRAMUSCULAR | Status: DC | PRN
  Administered 2018-08-13: 4000 [IU] via INTRAVENOUS

## 2018-08-13 MED ORDER — PROPOFOL 10 MG/ML IV BOLUS
INTRAVENOUS | Status: AC
  Filled 2018-08-13: qty 20

## 2018-08-13 MED ORDER — ROCURONIUM BROMIDE 100 MG/10ML IV SOLN
INTRAVENOUS | Status: DC | PRN
  Administered 2018-08-13: 50 mg via INTRAVENOUS
  Administered 2018-08-13: 10 mg via INTRAVENOUS

## 2018-08-13 MED ORDER — MIDAZOLAM HCL 2 MG/2ML IJ SOLN
INTRAMUSCULAR | Status: AC
  Filled 2018-08-13: qty 2

## 2018-08-13 MED ORDER — AMLODIPINE BESYLATE 5 MG PO TABS
5.0000 mg | ORAL_TABLET | Freq: Once | ORAL | Status: AC
  Administered 2018-08-13: 5 mg via ORAL
  Filled 2018-08-13: qty 1

## 2018-08-13 MED ORDER — GLYCOPYRROLATE 0.2 MG/ML IJ SOLN
INTRAMUSCULAR | Status: AC
  Filled 2018-08-13: qty 1

## 2018-08-13 MED ORDER — FENTANYL CITRATE (PF) 100 MCG/2ML IJ SOLN
INTRAMUSCULAR | Status: AC
  Filled 2018-08-13: qty 2

## 2018-08-13 MED ORDER — MIDAZOLAM HCL 2 MG/2ML IJ SOLN
INTRAMUSCULAR | Status: DC | PRN
  Administered 2018-08-13: 2 mg via INTRAVENOUS

## 2018-08-13 MED ORDER — FENTANYL CITRATE (PF) 100 MCG/2ML IJ SOLN
INTRAMUSCULAR | Status: AC
  Administered 2018-08-13: 25 ug via INTRAVENOUS
  Filled 2018-08-13: qty 2

## 2018-08-13 MED ORDER — SODIUM CHLORIDE 0.9 % IV SOLN
INTRAVENOUS | Status: DC | PRN
  Administered 2018-08-13: 40 ug/min via INTRAVENOUS

## 2018-08-13 MED ORDER — LIDOCAINE HCL (PF) 2 % IJ SOLN
INTRAMUSCULAR | Status: AC
  Filled 2018-08-13: qty 10

## 2018-08-13 MED ORDER — DEXAMETHASONE SODIUM PHOSPHATE 10 MG/ML IJ SOLN
INTRAMUSCULAR | Status: AC
  Filled 2018-08-13: qty 1

## 2018-08-13 MED ORDER — ONDANSETRON HCL 4 MG/2ML IJ SOLN: 4.0000 mg | Freq: Once | INTRAMUSCULAR | Status: DC | PRN

## 2018-08-13 MED ORDER — PROPOFOL 10 MG/ML IV BOLUS
INTRAVENOUS | Status: DC | PRN
  Administered 2018-08-13: 120 mg via INTRAVENOUS

## 2018-08-13 MED ORDER — LIDOCAINE HCL (CARDIAC) PF 100 MG/5ML IV SOSY
PREFILLED_SYRINGE | INTRAVENOUS | Status: DC | PRN
  Administered 2018-08-13: 80 mg via INTRAVENOUS

## 2018-08-13 MED ORDER — FENTANYL CITRATE (PF) 100 MCG/2ML IJ SOLN
25.0000 ug | INTRAMUSCULAR | Status: DC | PRN
  Administered 2018-08-13 (×2): 50 ug via INTRAVENOUS

## 2018-08-13 MED ORDER — ONDANSETRON HCL 4 MG/2ML IJ SOLN
INTRAMUSCULAR | Status: DC | PRN
  Administered 2018-08-13: 4 mg via INTRAVENOUS

## 2018-08-13 MED ORDER — SODIUM CHLORIDE 0.9 % IV SOLN
INTRAVENOUS | Status: DC | PRN
  Administered 2018-08-13: 30 ug/min via INTRAVENOUS

## 2018-08-13 MED ORDER — FAMOTIDINE IN NACL 20-0.9 MG/50ML-% IV SOLN
20.0000 mg | Freq: Two times a day (BID) | INTRAVENOUS | Status: DC
  Administered 2018-08-14 (×2): 20 mg via INTRAVENOUS
  Filled 2018-08-13 (×5): qty 50

## 2018-08-13 MED ORDER — ONDANSETRON HCL 4 MG/2ML IJ SOLN: 4.0000 mg | Freq: Four times a day (QID) | INTRAMUSCULAR | Status: DC | PRN

## 2018-08-13 MED ORDER — HEPARIN SODIUM (PORCINE) 1000 UNIT/ML IJ SOLN
INTRAMUSCULAR | Status: DC | PRN
  Administered 2018-08-13: 5000 [IU] via INTRAVENOUS

## 2018-08-13 MED ORDER — FENTANYL CITRATE (PF) 100 MCG/2ML IJ SOLN
INTRAMUSCULAR | Status: AC
  Administered 2018-08-13: 50 ug via INTRAVENOUS
  Filled 2018-08-13: qty 2

## 2018-08-13 MED ORDER — PHENYLEPHRINE HCL 10 MG/ML IJ SOLN
INTRAMUSCULAR | Status: AC
  Filled 2018-08-13: qty 1

## 2018-08-13 MED ORDER — HYDROMORPHONE HCL 1 MG/ML IJ SOLN
1.0000 mg | INTRAMUSCULAR | Status: DC | PRN
  Administered 2018-08-13: 0.5 mg via INTRAVENOUS

## 2018-08-13 MED ORDER — IOPAMIDOL (ISOVUE-300) INJECTION 61%
INTRAVENOUS | Status: DC | PRN
  Administered 2018-08-13: 60 mL via INTRAVENOUS

## 2018-08-13 MED ORDER — FENTANYL CITRATE (PF) 100 MCG/2ML IJ SOLN
25.0000 ug | INTRAMUSCULAR | Status: AC | PRN
  Administered 2018-08-13 (×6): 25 ug via INTRAVENOUS

## 2018-08-13 MED ORDER — EVICEL 2 ML EX KIT
PACK | CUTANEOUS | Status: DC | PRN
  Administered 2018-08-13: 2 mL

## 2018-08-13 MED ORDER — FENTANYL CITRATE (PF) 100 MCG/2ML IJ SOLN
INTRAMUSCULAR | Status: DC | PRN
  Administered 2018-08-13: 25 ug via INTRAVENOUS

## 2018-08-13 MED ORDER — DEXAMETHASONE SODIUM PHOSPHATE 10 MG/ML IJ SOLN
INTRAMUSCULAR | Status: DC | PRN
  Administered 2018-08-13: 10 mg via INTRAVENOUS

## 2018-08-13 MED ORDER — HYDROMORPHONE HCL 1 MG/ML IJ SOLN
INTRAMUSCULAR | Status: AC
  Filled 2018-08-13: qty 1

## 2018-08-13 MED ORDER — SUGAMMADEX SODIUM 200 MG/2ML IV SOLN
INTRAVENOUS | Status: AC
  Filled 2018-08-13: qty 2

## 2018-08-13 MED ORDER — SUGAMMADEX SODIUM 200 MG/2ML IV SOLN
INTRAVENOUS | Status: DC | PRN
  Administered 2018-08-13: 200 mg via INTRAVENOUS

## 2018-08-13 MED ORDER — SODIUM CHLORIDE 0.9 % IV SOLN
INTRAVENOUS | Status: DC | PRN
  Administered 2018-08-13: 15:00:00 via INTRAMUSCULAR

## 2018-08-13 MED ORDER — PHENYLEPHRINE HCL 10 MG/ML IJ SOLN
INTRAMUSCULAR | Status: DC | PRN
  Administered 2018-08-13 (×3): 100 ug via INTRAVENOUS
  Administered 2018-08-13: 200 ug via INTRAVENOUS
  Administered 2018-08-13 (×2): 100 ug via INTRAVENOUS
  Administered 2018-08-13 (×2): 200 ug via INTRAVENOUS
  Administered 2018-08-13: 100 ug via INTRAVENOUS

## 2018-08-13 MED ORDER — CEFAZOLIN SODIUM-DEXTROSE 2-4 GM/100ML-% IV SOLN
2.0000 g | Freq: Three times a day (TID) | INTRAVENOUS | Status: AC
  Administered 2018-08-13 – 2018-08-14 (×2): 2 g via INTRAVENOUS
  Filled 2018-08-13 (×2): qty 100

## 2018-08-13 MED ORDER — EPHEDRINE SULFATE 50 MG/ML IJ SOLN
INTRAMUSCULAR | Status: AC
  Filled 2018-08-13: qty 1

## 2018-08-13 MED ORDER — FENTANYL CITRATE (PF) 100 MCG/2ML IJ SOLN
INTRAMUSCULAR | Status: DC | PRN
  Administered 2018-08-13: 50 ug via INTRAVENOUS
  Administered 2018-08-13: 100 ug via INTRAVENOUS

## 2018-08-13 MED ORDER — CEFAZOLIN SODIUM-DEXTROSE 2-4 GM/100ML-% IV SOLN
INTRAVENOUS | Status: AC
  Administered 2018-08-13: 2 g via INTRAVENOUS
  Filled 2018-08-13: qty 100

## 2018-08-13 MED ORDER — PHENYLEPHRINE HCL 10 MG/ML IJ SOLN
INTRAMUSCULAR | Status: DC | PRN
  Administered 2018-08-13 (×2): 100 ug via INTRAVENOUS
  Administered 2018-08-13 (×3): 200 ug via INTRAVENOUS

## 2018-08-13 MED ORDER — SUCCINYLCHOLINE CHLORIDE 20 MG/ML IJ SOLN
INTRAMUSCULAR | Status: DC | PRN
  Administered 2018-08-13: 100 mg via INTRAVENOUS

## 2018-08-13 MED ORDER — LIDOCAINE HCL (CARDIAC) PF 100 MG/5ML IV SOSY
PREFILLED_SYRINGE | INTRAVENOUS | Status: DC | PRN
  Administered 2018-08-13: 60 mg via INTRAVENOUS

## 2018-08-13 MED ORDER — AMLODIPINE BESYLATE 5 MG PO TABS
5.0000 mg | ORAL_TABLET | Freq: Every day | ORAL | Status: DC
  Administered 2018-08-13: 5 mg via ORAL
  Filled 2018-08-13: qty 1

## 2018-08-13 MED ORDER — EPHEDRINE SULFATE 50 MG/ML IJ SOLN
INTRAMUSCULAR | Status: DC | PRN
  Administered 2018-08-13 (×4): 10 mg via INTRAVENOUS

## 2018-08-13 SURGICAL SUPPLY — 68 items
APPLIER CLIP 11 MED OPEN (CLIP)
APPLIER CLIP 9.375 SM OPEN (CLIP)
BAG COUNTER SPONGE EZ (MISCELLANEOUS) ×4 IMPLANT
BAG DECANTER FOR FLEXI CONT (MISCELLANEOUS) ×4 IMPLANT
BAG ISOLATATION DRAPE 20X20 ST (DRAPES) ×3 IMPLANT
BLADE SURG 15 STRL LF DISP TIS (BLADE) ×3 IMPLANT
BLADE SURG 15 STRL SS (BLADE) ×1
BLADE SURG SZ11 CARB STEEL (BLADE) ×4 IMPLANT
BOOT SUTURE AID YELLOW STND (SUTURE) ×4 IMPLANT
BRUSH SCRUB EZ  4% CHG (MISCELLANEOUS) ×1
BRUSH SCRUB EZ 4% CHG (MISCELLANEOUS) ×3 IMPLANT
CANISTER SUCT 1200ML W/VALVE (MISCELLANEOUS) ×4 IMPLANT
CATH EMBOLECTOMY 3X80 (CATHETERS) ×4 IMPLANT
CLIP APPLIE 11 MED OPEN (CLIP) IMPLANT
CLIP APPLIE 9.375 SM OPEN (CLIP) IMPLANT
COVER WAND RF STERILE (DRAPES) ×4 IMPLANT
DERMABOND ADVANCED (GAUZE/BANDAGES/DRESSINGS) ×1
DERMABOND ADVANCED .7 DNX12 (GAUZE/BANDAGES/DRESSINGS) ×3 IMPLANT
DRAPE INCISE IOBAN 66X45 STRL (DRAPES) ×4 IMPLANT
DRAPE ISOLATE BAG 20X20 STRL (DRAPES) ×1
DRAPE LAPAROTOMY 100X77 ABD (DRAPES) ×4 IMPLANT
DURAPREP 26ML APPLICATOR (WOUND CARE) ×4 IMPLANT
ELECT CAUTERY BLADE 6.4 (BLADE) ×4 IMPLANT
ELECT REM PT RETURN 9FT ADLT (ELECTROSURGICAL) ×4
ELECTRODE REM PT RTRN 9FT ADLT (ELECTROSURGICAL) ×3 IMPLANT
GLOVE BIO SURGEON STRL SZ7 (GLOVE) ×8 IMPLANT
GLOVE INDICATOR 7.5 STRL GRN (GLOVE) ×4 IMPLANT
GOWN STRL REUS W/ TWL LRG LVL3 (GOWN DISPOSABLE) ×3 IMPLANT
GOWN STRL REUS W/ TWL XL LVL3 (GOWN DISPOSABLE) ×6 IMPLANT
GOWN STRL REUS W/TWL LRG LVL3 (GOWN DISPOSABLE) ×1
GOWN STRL REUS W/TWL XL LVL3 (GOWN DISPOSABLE) ×2
HEMOSTAT SURGICEL 2X3 (HEMOSTASIS) ×4 IMPLANT
IV NS 500ML (IV SOLUTION) ×1
IV NS 500ML BAXH (IV SOLUTION) ×3 IMPLANT
KIT TURNOVER KIT A (KITS) ×4 IMPLANT
LABEL OR SOLS (LABEL) ×4 IMPLANT
LOOP RED MAXI  1X406MM (MISCELLANEOUS) ×2
LOOP VESSEL MAXI 1X406 RED (MISCELLANEOUS) ×6 IMPLANT
LOOP VESSEL MINI 0.8X406 BLUE (MISCELLANEOUS) ×6 IMPLANT
LOOPS BLUE MINI 0.8X406MM (MISCELLANEOUS) ×2
NDL SAFETY ECLIPSE 18X1.5 (NEEDLE) ×3 IMPLANT
NEEDLE HYPO 18GX1.5 SHARP (NEEDLE) ×1
NS IRRIG 500ML POUR BTL (IV SOLUTION) ×4 IMPLANT
PACK BASIN MAJOR ARMC (MISCELLANEOUS) ×4 IMPLANT
PACK UNIVERSAL (MISCELLANEOUS) IMPLANT
PATCH CAROTID ECM VASC 1X10 (Prosthesis & Implant Heart) ×4 IMPLANT
SUT MNCRL 4-0 (SUTURE) ×1
SUT MNCRL 4-0 27XMFL (SUTURE) ×3
SUT PROLENE 5 0 RB 1 DA (SUTURE) ×8 IMPLANT
SUT PROLENE 6 0 BV (SUTURE) ×16 IMPLANT
SUT PROLENE 7 0 BV 1 (SUTURE) ×8 IMPLANT
SUT SILK 2 0 (SUTURE) ×1
SUT SILK 2-0 18XBRD TIE 12 (SUTURE) ×3 IMPLANT
SUT SILK 3 0 (SUTURE) ×1
SUT SILK 3-0 18XBRD TIE 12 (SUTURE) ×3 IMPLANT
SUT SILK 4 0 (SUTURE) ×1
SUT SILK 4-0 18XBRD TIE 12 (SUTURE) ×3 IMPLANT
SUT VIC AB 2-0 CT1 27 (SUTURE) ×2
SUT VIC AB 2-0 CT1 TAPERPNT 27 (SUTURE) ×6 IMPLANT
SUT VIC AB 3-0 SH 27 (SUTURE) ×1
SUT VIC AB 3-0 SH 27X BRD (SUTURE) ×3 IMPLANT
SUT VICRYL+ 3-0 36IN CT-1 (SUTURE) ×8 IMPLANT
SUTURE MNCRL 4-0 27XMF (SUTURE) ×3 IMPLANT
SYR 20CC LL (SYRINGE) ×4 IMPLANT
SYR 3ML LL SCALE MARK (SYRINGE) ×4 IMPLANT
SYR 5ML LL (SYRINGE) ×4 IMPLANT
TOWEL OR 17X26 4PK STRL BLUE (TOWEL DISPOSABLE) ×4 IMPLANT
TRAY FOLEY MTR SLVR 16FR STAT (SET/KITS/TRAYS/PACK) ×4 IMPLANT

## 2018-08-13 SURGICAL SUPPLY — 51 items
BLADE SURG 15 STRL LF DISP TIS (BLADE) ×1 IMPLANT
BLADE SURG 15 STRL SS (BLADE) ×1
BLADE SURG SZ11 CARB STEEL (BLADE) ×2 IMPLANT
BOOT SUTURE AID YELLOW STND (SUTURE) ×2 IMPLANT
CATH ACCU-VU SIZ PIG 5F 70CM (CATHETERS) ×2 IMPLANT
CATH BALLN CODA 9X100X32 (BALLOONS) ×2 IMPLANT
CATH BEACON 5 .035 65 C2 TIP (CATHETERS) ×2 IMPLANT
CATH BEACON 5 .035 65 KMP TIP (CATHETERS) ×2 IMPLANT
COVER PROBE U/S 5X48 (MISCELLANEOUS) ×2 IMPLANT
DEVICE CLOSURE PERCLS PRGLD 6F (VASCULAR PRODUCTS) ×11 IMPLANT
DEVICE SAFEGUARD 24CM (GAUZE/BANDAGES/DRESSINGS) ×4 IMPLANT
DEVICE TORQUE .025-.038 (MISCELLANEOUS) ×2 IMPLANT
DRYSEAL FLEXSHEATH 12FR 33CM (SHEATH) ×1
DRYSEAL FLEXSHEATH 16FR 33CM (SHEATH) ×1
ELECT REM PT RETURN 9FT ADLT (ELECTROSURGICAL) ×2
ELECTRODE REM PT RTRN 9FT ADLT (ELECTROSURGICAL) ×1 IMPLANT
EXCLUDER TNK LEG 23MX14X14 (Endovascular Graft) ×1 IMPLANT
EXCLUDER TRUNK LEG 23MX14X14 (Endovascular Graft) ×2 IMPLANT
GLIDEWIRE STIFF .35X180X3 HYDR (WIRE) ×2 IMPLANT
GLOVE BIO SURGEON STRL SZ7 (GLOVE) ×6 IMPLANT
GLOVE SURG SYN 8.0 (GLOVE) ×2 IMPLANT
GOWN STRL REUS W/ TWL LRG LVL3 (GOWN DISPOSABLE) ×1 IMPLANT
GOWN STRL REUS W/ TWL XL LVL3 (GOWN DISPOSABLE) ×2 IMPLANT
GOWN STRL REUS W/TWL LRG LVL3 (GOWN DISPOSABLE) ×1
GOWN STRL REUS W/TWL XL LVL3 (GOWN DISPOSABLE) ×2
IV NS 1000ML (IV SOLUTION) ×1
IV NS 1000ML BAXH (IV SOLUTION) ×1 IMPLANT
LEG CONTRALATERAL 16X12X10 (Vascular Products) ×1 IMPLANT
LOOP RED MAXI  1X406MM (MISCELLANEOUS) ×1
LOOP VESSEL MAXI 1X406 RED (MISCELLANEOUS) ×1 IMPLANT
LOOP VESSEL MINI 0.8X406 BLUE (MISCELLANEOUS) ×1 IMPLANT
LOOPS BLUE MINI 0.8X406MM (MISCELLANEOUS) ×1
NEEDLE ENTRY 21GA 7CM ECHOTIP (NEEDLE) ×2 IMPLANT
PACK ANGIOGRAPHY (CUSTOM PROCEDURE TRAY) ×2 IMPLANT
PACK BASIN MAJOR ARMC (MISCELLANEOUS) ×2 IMPLANT
PERCLOSE PROGLIDE 6F (VASCULAR PRODUCTS) ×22
SET INTRO CAPELLA COAXIAL (SET/KITS/TRAYS/PACK) ×2 IMPLANT
SHEATH 9FRX11 (SHEATH) ×4 IMPLANT
SHEATH BRITE TIP 6FRX11 (SHEATH) ×4 IMPLANT
SHEATH DRYSEAL FLEX 12FR 33CM (SHEATH) ×1 IMPLANT
SHEATH DRYSEAL FLEX 16FR 33CM (SHEATH) ×1 IMPLANT
STENT GRAFT CONTRALAT 16X12X10 (Vascular Products) ×1 IMPLANT
SUT MNCRL 4-0 (SUTURE) ×1
SUT MNCRL 4-0 27XMFL (SUTURE) ×1
SUT PROLENE 6 0 BV (SUTURE) ×2 IMPLANT
SUTURE MNCRL 4-0 27XMF (SUTURE) ×1 IMPLANT
SYR 20CC LL (SYRINGE) ×2 IMPLANT
TOWEL OR 17X26 4PK STRL BLUE (TOWEL DISPOSABLE) ×2 IMPLANT
TUBING CONTRAST HIGH PRESS 72 (TUBING) ×2 IMPLANT
WIRE AMPLATZ SSTIFF .035X260CM (WIRE) ×4 IMPLANT
WIRE J 3MM .035X145CM (WIRE) ×4 IMPLANT

## 2018-08-13 NOTE — Anesthesia Post-op Follow-up Note (Signed)
Anesthesia QCDR form completed.        

## 2018-08-13 NOTE — Op Note (Signed)
OPERATIVE NOTE   PROCEDURE: 1. Right common femoral, profunda femoris, and superficial femoral artery endarterectomies and patch angioplasty 2.   Right SFA and profunda femoris Fogarty embolectomy with a 3 Fogarty embolectomy balloon    PRE-OPERATIVE DIAGNOSIS: 1.ischemic right lower extremity after endovascular aneurysm repair 2. AAA  POST-OPERATIVE DIAGNOSIS: Same  SURGEON: [name], MD  CO-surgeon: Hortencia Pilar, MD  ANESTHESIA: general  ESTIMATED BLOOD LOSS: 50 cc  FINDING(S): 1. significant plaque in right common femoral, profunda femoris, and superficial femoral arteries with occlusion of the artery because of the Perclose narrowing the lumen  SPECIMEN(S): Right common femoral, profunda femoris, and superficial femoral artery plaque.  INDICATIONS:  Patient presents with an ischemic leg after endovascular aneurysm repair earlier today.  Right femoral exploration and likely endarterectomy is planned to try to improve perfusion. The risks and benefits as well as alternative therapies including intervention were reviewed in detail all questions were answered the patient agrees to proceed with surgery.  DESCRIPTION: After obtaining full informed written consent, the patient was brought back to the operating room and placed supine upon the operating table. The patient received IV antibiotics prior to induction. After obtaining adequate anesthesia, the patient was prepped and draped in the standard fashion appropriate time out is called.   Vertical incision was created overlying the right femoral arteries. The common femoral artery proximally, and superficial femoral artery, and primary profunda femoris artery branches were encircled with vessel loops and prepared for control. The right femoral arteries were found to have significant plaque from the common femoral artery into the profunda and superficial femoral arteries but only into the proximal segments.   4000  units of heparin was given and allowed circulate for 5 minutes.   Attention is then turned to the right femoral artery. An arteriotomy is made with 11 blade and extended with Potts scissors in the common femoral artery and carried down onto the first 1-2 cm of the profunda femoris artery.  On opening the vessel, the Perclose devices appeared to narrow the lumen with a large posterior plaque on the back wall creating no flow distally.  This posterior plaque needed to be removed with an endarterectomy.  An endarterectomy was then performed. The [facility] was used to create a plane. The proximal endpoint was cut flush with tenotomy scissors. This was in the proximal common femoral artery.  Excellent inflow was seen with no thrombus on flushing.  An eversion endarterectomy was then performed for the first 2-3 cm of the superficial femoral artery. Good backbleeding was not initially seen. The distal endpoint of the profunda femoris endarterectomy was created with gentle traction and the distal endpoint was clean. Again, great backbleeding was not seen from the profunda femoris artery.  I then elected to pass Fogarty embolectomy balloons in the SFA and profunda femoris artery.  A pass was made to about 40 cm down the SFA into the popliteal artery with the 3 Fogarty embolectomy balloon.  No significant thrombus seem to be returned, but good backbleeding was seen after this.  2 passes with the 3 Fogarty embolectomy balloon were then performed profunda femoris artery going 15 to 20 cm at the vessel.  Again, no significant thrombus was seen, but good backbleeding was seen after using the Fogarty embolectomy balloon.  The Cormatrix patcth is then selected and prepared for a patch angioplasty.  It is cut and beveled and started at the proximal endpoint with a 6-0 Prolene suture.  Approximately one half of the suture  line is run medially and laterally and the distal end point was cut and bevelled to match the  arteriotomy.  A second 6-0 Prolene was started at the distal end point and run to the mid portion to complete the arteriotomy.  The vessel was flushed prior to release of control and completion of the anastomosis.  At this point, flow was established first to the profunda femoris artery and then to the superficial femoral artery. Easily palpable pulses are noted well beyond the anastomosis and both arteries and continuous-wave Doppler was also used to confirm excellent flow beyond the repair.  Surgicel and Evicel topical hemostatic agents were placed in the femoral incision and hemostasis was complete. The femoral incision was then closed in a layered fashion with 2 layers of 2-0 Vicryl, 2 layers of 3-0 Vicryl, and 4-0 Monocryl for the skin closure. Dermabond and sterile dressing were then placed over the incision.  The patient was then awakened from anesthesia and taken to the recovery room in stable condition having tolerated the procedure well.  COMPLICATIONS: None  CONDITION: Stable     [name] 08/13/2018 5:08 PM   This note was created with Dragon Medical transcription system. Any errors in dictation are purely unintentional.

## 2018-08-13 NOTE — Transfer of Care (Signed)
Immediate Anesthesia Transfer of Care Note  Patient: Heather Mahoney  Procedure(s) Performed: Procedure(s): Right common femoral, profunda femoris, and superficial femoral artery endarterectomies and patch angioplasty  (Right) Right SFA and profunda femoris Fogarty embolectomy 3  Fogarty embolectomy balloon  Patient Location: PACU  Anesthesia Type:General  Level of Consciousness: sedated  Airway & Oxygen Therapy: Patient Spontanous Breathing and Patient connected to face mask oxygen  Post-op Assessment: Report given to RN and Post -op Vital signs reviewed and stable  Post vital signs: Reviewed and stable  Last Vitals:  Vitals:   08/13/18 1425 08/13/18 1735  BP: 133/83 139/85  Pulse: 64 70  Resp: 16 13  Temp:  (!) 36.2 C  SpO2: 86% 381%    Complications: No apparent anesthesia complications

## 2018-08-13 NOTE — Progress Notes (Signed)
A UDS is pedning but this is an emergent situation at this point and she needs to go back to the OR now for revascularization

## 2018-08-13 NOTE — Op Note (Signed)
OPERATIVE NOTE   PROCEDURE: 1. US guidance for vascular access, bilateral femoral arteries 2. Catheter placement into aorta from bilateral femoral approaches 3. Placement of a 23 mm proximal 14 cm length Gore Excluder Endoprosthesis main body right with a 12 mm diameter by 10 cm length left contralateral limb 4. ProGlide closure devices bilateral femoral arteries  PRE-OPERATIVE DIAGNOSIS: AAA  POST-OPERATIVE DIAGNOSIS: same  SURGEON: [name], MD and Hortencia Pilar, MD - Co-surgeons  ANESTHESIA: General  ESTIMATED BLOOD LOSS: 25 cc  FINDING(S): 1.  AAA  SPECIMEN(S):  none  INDICATIONS:   Heather Mahoney is a 52 y.o. female who presents with an enlarging symptomatic abdominal aortic aneurysm. The anatomy was suitable for endovascular repair.  Risks and benefits of repair in an endovascular fashion were discussed and informed consent was obtained. Co-surgeons are used to expedite the procedure and reduce operative time as bilateral work needs to be done.  DESCRIPTION: After obtaining full informed written consent, the patient was brought back to the operating room and placed supine upon the operating table.  The patient received IV antibiotics prior to induction.  After obtaining adequate anesthesia, the patient was prepped and draped in the standard fashion for endovascular AAA repair.  We then began by gaining access to both femoral arteries with US guidance with me working on the left and Dr. Delana Meyer working on the right.  The femoral arteries were found to be patent and accessed without difficulty with a needle under ultrasound guidance without difficulty on each side and permanent images were recorded.  We then placed 2 proglide devices on each side in a pre-close fashion and placed 8 French sheaths. The patient was then given 5000 units of intravenous heparin. The Pigtail catheter was placed into the aorta from the right side. Using this image, we selected a 23 mm proximal 14  cm length Main body device.  Over a stiff wire, an 16 French sheath was placed up the right side. The main body was then placed through the 16 French sheath. A Kumpe catheter was placed up the left side and a magnified image at the renal arteries was performed. The main body was then deployed just below the lowest renal artery. The Kumpe catheter was used to cannulate the contralateral gate with a mild amount of difficulty and successful cannulation was confirmed by twirling the pigtail catheter in the main body. We then placed a stiff wire and a retrograde arteriogram was performed through the left femoral sheath. We upsized to the 12 [place] sheath on the left for the contralateral limb and a 12 mm diameter by 10 cm length left iliac limb was selected and deployed. The main body deployment was then completed. Based off the angiographic findings, extension limbs were not necessary. All junction points and seals zones were treated with the compliant balloon. The pigtail catheter was then replaced and a completion angiogram was performed.  Type II endoleak was detected on completion angiography. The renal arteries were found to be widely patent. At this point we elected to terminate the procedure. We secured the pro glide devices for hemostasis on the femoral arteries. The skin incision was closed with a 4-0 Monocryl. Dermabond and pressure dressing were placed. The patient was taken to the recovery room in stable condition having tolerated the procedure well.  COMPLICATIONS: none  CONDITION: stable  [name]  08/13/2018, 1:19 PM   This note was created with Dragon Medical transcription system. Any errors in dictation are purely  unintentional. 

## 2018-08-13 NOTE — Progress Notes (Signed)
Lt femoral

## 2018-08-13 NOTE — Progress Notes (Signed)
Pt's family updated on pt's condition and plan of care discussed with charge nurse.  Bubba Camp, RN

## 2018-08-13 NOTE — Anesthesia Postprocedure Evaluation (Signed)
Anesthesia Post Note  Patient: Heather Mahoney  Procedure(s) Performed: ENDOVASCULAR REPAIR/STENT GRAFT (N/A )  Patient location during evaluation: PACU Anesthesia Type: General Level of consciousness: awake and alert Pain management: pain level controlled Vital Signs Assessment: post-procedure vital signs reviewed and stable Respiratory status: spontaneous breathing, nonlabored ventilation, respiratory function stable and patient connected to nasal cannula oxygen Cardiovascular status: blood pressure returned to baseline and stable Postop Assessment: no apparent nausea or vomiting Anesthetic complications: no  possible hematoma. Dr. Lucky Cowboy will check.   Last Vitals:  Vitals:   08/13/18 1410 08/13/18 1425  BP: 136/86 133/83  Pulse: 64 64  Resp: 20 16  Temp: (!) 35.7 C   SpO2: 99% 98%    Last Pain:  Vitals:   08/13/18 1440  TempSrc:   PainSc: 10-Worst pain ever                 Leeon Makar S

## 2018-08-13 NOTE — H&P (Signed)
[facility] History & Physical Update  The patient was interviewed and re-examined.  The patient's previous History and Physical has been reviewed and is unchanged.  There is no change in the plan of care. We plan to proceed with the scheduled procedure.  Hortencia Pilar, MD  08/13/2018, 10:54 AM

## 2018-08-13 NOTE — Progress Notes (Signed)
Dr. Lucky Cowboy at bedside discussing the necessary exploration of right groin/leg with pt.  Bubba Camp, RN

## 2018-08-13 NOTE — Progress Notes (Addendum)
BP continues to be elevated at 160/99 after prn IV hydralazine given. Prime doc notified. Will continue to monitor. No new orders at this time.   0644: Per orders, will give 5mg  amlodipine now. Will continue to monitor.

## 2018-08-13 NOTE — Anesthesia Preprocedure Evaluation (Signed)
Anesthesia Evaluation  Patient identified by MRN, date of birth, ID band Patient awake    Reviewed: Allergy & Precautions, NPO status , Patient's Chart, lab work & pertinent test results, reviewed documented beta blocker date and time   Airway Mallampati: II  TM Distance: >3 FB     Dental  (+) Chipped   Pulmonary Current Smoker,           Cardiovascular hypertension, Pt. on medications and Pt. on home beta blockers      Neuro/Psych  Headaches,    GI/Hepatic   Endo/Other    Renal/GU      Musculoskeletal   Abdominal   Peds  Hematology   Anesthesia Other Findings Severe overbite. She swears she is not pregnant. Smokes. 2 ppd. Hx of PVCs.  Reproductive/Obstetrics                             Anesthesia Physical Anesthesia Plan  ASA: III  Anesthesia Plan: General   Post-op Pain Management:    Induction: Intravenous  PONV Risk Score and Plan:   Airway Management Planned: Oral ETT  Additional Equipment:   Intra-op Plan:   Post-operative Plan:   Informed Consent: I have reviewed the patients History and Physical, chart, labs and discussed the procedure including the risks, benefits and alternatives for the proposed anesthesia with the patient or authorized representative who has indicated his/her understanding and acceptance.     Plan Discussed with: CRNA  Anesthesia Plan Comments:         Anesthesia Quick Evaluation

## 2018-08-13 NOTE — Progress Notes (Signed)
   [facility] [place] NAME: Heather Mahoney    MR#:  [id number]  DATE OF BIRTH:  15-Sep-1966  SUBJECTIVE:  CHIEF COMPLAINT:   Chief Complaint  Patient presents with  . Pelvic Pain   The patient is status post AAA repair, sedated, in PACU.  She only complains of right leg pain and cold. REVIEW OF SYSTEMS:  Review of Systems  Unable to perform ROS: Medical condition    DRUG ALLERGIES:   Allergies  Allergen Reactions  . Strawberry Extract Hives and Swelling  . Strawberry Flavor Rash   VITALS:  Blood pressure 133/83, pulse 64, temperature (!) 96.3 F (35.7 C), resp. rate 16, height 5\' 7"  (1.702 m), weight 65.3 kg, SpO2 98 %. PHYSICAL EXAMINATION:  Physical Exam  Constitutional: No distress.  HENT:  Head: Normocephalic.  Mouth/Throat: Oropharynx is clear and moist.  Eyes: Pupils are equal, round, and reactive to light. Conjunctivae and EOM are normal. No scleral icterus.  Neck: Neck supple. No JVD present. No tracheal deviation present.  Cardiovascular: Normal rate, regular rhythm and normal heart sounds. Exam reveals no gallop.  No murmur heard. No pedal pulses on the right side.  Right leg is colder than left leg.  Pulmonary/Chest: Effort normal and breath sounds normal. No respiratory distress. She has no wheezes. She has no rales.  Abdominal: Soft. Bowel sounds are normal. She exhibits no distension. There is no tenderness. There is no rebound.  Musculoskeletal: She exhibits no edema or tenderness.  Neurological: No cranial nerve deficit.  Sedated.  Skin: No rash noted. No erythema.   LABORATORY PANEL:  Female CBC Recent Labs  Lab 08/13/18 0408  WBC 10.2  HGB 11.8*  HCT 35.9*  PLT 438*   ------------------------------------------------------------------------------------------------------------------ Chemistries  Recent Labs  Lab 08/13/18 0408  NA 139  K 4.2  CL 105  CO2 25  GLUCOSE 104*  BUN 16  CREATININE  0.68  CALCIUM 9.1  MG 2.1   RADIOLOGY:  No results found. ASSESSMENT AND PLAN:   AAA (abdominal aortic aneurysm) ([place]):   S/P AAA repair. Pain control.  Transferred to ICU to monitor per Dr. Delana Meyer.  Acute right leg ischemia.  The patient is taken back to OR for femoral exploration with thrombectomy and possible angiograph per Dr. Lucky Cowboy.  Hypertension malignancy.  Better controlled. Off nicardipine gtt, continue lopressor and losartan, added norvasc, IV hydralazine.  Hyperlipidemia: home medication continued.  Chronic thrombocytopenia.  Unclear etiology.  Stable.  Tobacco abuse.  Smoking cessation was counseled for 3 to 4 minutes.  The patient wants to quit.  On nicotine patch.  Discussed with Dr. Delana Meyer in PACU. All the records are reviewed and case discussed with Care Management/Social Worker. Management plans discussed with the patient, family and they are in agreement.  CODE STATUS: Full Code  TOTAL TIME TAKING CARE OF THIS PATIENT: 26 minutes.   More than 50% of the time was spent in counseling/coordination of care: YES  POSSIBLE D/C IN 2 DAYS, DEPENDING ON CLINICAL CONDITION.   Demetrios Loll M.D on 08/13/2018 at 3:57 PM  Between 7am to 6pm - Pager - [phone]  After 6pm go to [email or web address] - [facility]

## 2018-08-13 NOTE — Op Note (Signed)
OPERATIVE NOTE   PROCEDURE: 1. US guidance for vascular access, bilateral femoral arteries 2. Catheter placement into aorta from bilateral femoral approaches 3. Placement of a 23 x 14 x 14 C3 Gore Excluder Endoprosthesis main body  with a 12 x 10 contralateral limb 4. ProGlide closure devices bilateral femoral arteries  PRE-OPERATIVE DIAGNOSIS: AAA  POST-OPERATIVE DIAGNOSIS: same  SURGEON: Hortencia Pilar, MD and [name], MD - Co-surgeons  ANESTHESIA: general  ESTIMATED BLOOD LOSS: 50 cc  FINDING(S): 1.  AAA  SPECIMEN(S):  none  INDICATIONS:   Heather Mahoney is a 52 y.o. y.o. female who presents with 4.8 cm abdominal aortic aneurysm.  She presented to the emergency room actually 2 days ago with hypertensive crisis and severe 10 out of 10 abdominal and back pain.  By palpation her aneurysm was tender.  On comparison to a CT from approximately 1 year ago she has had more than 1 cm of growth.  Given these findings she has a symptomatic aneurysm that is nearly 5 cm and therefore should undergo repair to prevent lethal rupture.  Risks and benefits of been reviewed all questions answered patient is a candidate for endovascular repair.  She wishes to move forward.  DESCRIPTION: After obtaining full informed written consent, the patient was brought back to the operating room and placed supine upon the operating table.  The patient received IV antibiotics prior to induction.  After obtaining adequate anesthesia, the patient was prepped and draped in the standard fashion for endovascular AAA repair.  Co-surgeons are required because this is a complex bilateral procedure with work being performed simultaneously from both the right femoral and left femoral approach.  This also expedites the procedure making a shorter operative time reducing complications and improving patient safety.  We then began by gaining access to both femoral arteries with US guidance with me working on the patient's  right and Dr. Lucky Cowboy working on the patient's left.  The femoral arteries were found to be patent and accessed without difficulty with a needle under ultrasound guidance without difficulty on each side and permanent images were recorded.  We then placed 2 proglide devices on each side in a pre-close fashion and placed 8 French sheaths.  The patient was then given 5000 units of intravenous heparin.   The Pigtail catheter was placed into the aorta from the right side. Using this image, we selected a 23 x 14 x 14 Main body device.  Over a stiff wire, an 16 French sheath was placed. The main body was then placed through the 16 French sheath. A Kumpe catheter was placed up the right side and a magnified image at the renal arteries was performed. The main body was then deployed just below the lowest renal artery. The Kumpe catheter was used to cannulate the contralateral gate without difficulty and successful cannulation was confirmed by twirling the pigtail catheter in the main body. We then placed a stiff wire and a retrograde arteriogram was performed through the left femoral sheath. We upsized to the 12 [place] sheath for the contralateral limb and a 12 x 10 limb was selected and deployed. The main body deployment was then completed. Based off the angiographic findings, extension limbs were not necessary.  All junction points and seals zones were treated with the compliant balloon.   The pigtail catheter was then replaced and a completion angiogram was performed.   No endoleak was detected on completion angiography. The renal arteries were found to be widely patent.  At this point we elected to terminate the procedure. We secured the pro glide devices for hemostasis on the femoral arteries. The skin incision was closed with a 4-0 Monocryl. Dermabond and pressure dressing were placed. The patient was taken to the recovery room in stable condition having tolerated the procedure well.  COMPLICATIONS:  none  CONDITION: stable  Hortencia Pilar  08/13/2018, 2:19 PM

## 2018-08-13 NOTE — Progress Notes (Signed)
Progress Note  Patient Name: Heather Mahoney Date of Encounter: 08/13/2018  Primary Cardiologist: Ida Rogue, MD  Subjective   Tired.  Didn't sleep well b/c she's accustomed to working third shift.  BP was up this AM and she had a headache.  Scheduled for endograft @ 10a.  No c/p or dyspnea.  Inpatient Medications    Scheduled Meds: . amLODipine  5 mg Oral Daily  . atorvastatin  20 mg Oral Daily  . Influenza vac split quadrivalent PF  0.5 mL Intramuscular Tomorrow-1000  . losartan  100 mg Oral Daily  . metoprolol succinate  25 mg Oral Daily  . nicotine  14 mg Transdermal Daily  . sodium chloride flush  3 mL Intravenous Q12H  . sodium chloride flush  3 mL Intravenous Q12H   Continuous Infusions: . sodium chloride 75 mL/hr at 08/13/18 0010  .  ceFAZolin (ANCEF) IV     PRN Meds: acetaminophen, albuterol, bisacodyl, hydrALAZINE, Melatonin, morphine injection, oxyCODONE-acetaminophen, phenol, senna-docusate   Vital Signs    Vitals:   08/13/18 0635 08/13/18 0757 08/13/18 0945 08/13/18 1001  BP: (!) 160/99 (!) 187/100 (!) 152/112 (!) 154/89  Pulse:  63 63 62  Resp:    12  Temp:  98.1 F (36.7 C)  98.1 F (36.7 C)  TempSrc:  Oral  Oral  SpO2:  100%  100%  Weight:    65.3 kg  Height:    5\' 7"  (1.702 m)    Intake/Output Summary (Last 24 hours) at 08/13/2018 1025 Last data filed at 08/13/2018 0012 Gross per 24 hour  Intake -  Output 1400 ml  Net -1400 ml   Filed Weights   08/10/18 1557 08/11/18 1322 08/13/18 1001  Weight: 68 kg 65.3 kg 65.3 kg    Physical Exam   GEN: Well nourished, well developed, in no acute distress.  HEENT: Grossly normal.  Neck: Supple, no JVD, carotid bruits, or masses. Cardiac: RRR, no murmurs, rubs, or gallops. No clubbing, cyanosis, edema.  Radials/DP/PT 2+ and equal bilaterally.  Respiratory:  Respirations regular and unlabored, clear to auscultation bilaterally. GI: Soft, nontender, nondistended, BS + x 4. MS: no deformity or  atrophy. Skin: warm and dry, no rash. Neuro:  Strength and sensation are intact. Psych: AAOx3.  Normal affect.  Labs    Chemistry Recent Labs  Lab 08/10/18 1607 08/11/18 0455 08/13/18 0408  NA 140 140 139  K 3.6 3.9 4.2  CL 104 106 105  CO2 27 26 25   GLUCOSE 130* 99 104*  BUN 13 12 16   CREATININE 0.79 0.74 0.68  CALCIUM 9.2 9.1 9.1  GFRNONAA >60 >60 >60  GFRAA >60 >60 >60  ANIONGAP 9 8 9      Hematology Recent Labs  Lab 08/10/18 1607 08/11/18 0455 08/13/18 0408  WBC 11.6* 11.4* 10.2  RBC 4.30 4.58 4.34  HGB 11.9* 12.3 11.8*  HCT 36.4 38.7 35.9*  MCV 84.7 84.5 82.7  MCH 27.7 26.9 27.2  MCHC 32.7 31.8 32.9  RDW 16.4* 16.2* 15.6*  PLT 439* 476* 438*     Radiology    No results found.  Telemetry    Sinus rhythm/sinus brady - Personally Reviewed  Cardiac Studies   02/06/2016 2D Echocardiogram   Study Conclusions  - Left ventricle: The cavity size was normal. Systolic function was   normal. The estimated ejection fraction was in the range of 55%   to 60%. Wall motion was normal; there were no regional wall   motion abnormalities. Left  ventricular diastolic function   parameters were normal. - Mitral valve: There was mild regurgitation. - Right ventricle: Systolic function was normal. - Pulmonary arteries: Systolic pressure was within the normal   range. - Pericardium, extracardiac: A trivial pericardial effusion was   identified. _____________    Patient Profile     52 y.o. female with a history of AAA, HTN with previous emergency room visits for hypertensive urgency, HLD, GERD, PVCs and 40 pack-year tobacco smoking history who we were asked to eval for preop eval (aortic endograft) on 10/24.  Assessment & Plan    1.  AAA/Preop cardiovascular assessment:  As noted yesterday, low risk for cardiac complications related to endografting.  Cont  blocker/statin.  2.  HTN Emergency:  BP 130's to 150's yesterday.  180 this AM but dropped to 150's after  AM meds.  Cont to follow w/ low threshold to titrate amlodipine to 10 daily if she's persistently >789BOER systolic.  3.  HL:  Cont statin.  Will need f/u as outpt.  4.  Tob Abuse:  Was smoking 2ppd.  She says that she is now committed to quitting.  Complete cessation advised.  She would like a nicotine patch @ discharge.  [facility] will sign off.   Medication Recommendations:  Cont antihypertensives and consider titrating amlodipine to 10mg  daily if sbp persistetnly>149mmHg Other recommendations (labs, testing, etc):  F/u lipids/lft's in 6-8 wks Follow up as an outpatient:  Cardiology in 1-2 months.   Signed, Murray Hodgkins, NP  08/13/2018, 10:25 AM    For questions or updates, please contact   Please consult [email or web address] for contact info under Cardiology/STEMI.

## 2018-08-13 NOTE — Anesthesia Procedure Notes (Signed)
Procedure Name: Intubation Date/Time: 08/13/2018 11:15 AM Performed by: Gunnar Bulla, MD Pre-anesthesia Checklist: Patient identified, Emergency Drugs available, Suction available, Patient being monitored and Timeout performed Patient Re-evaluated:Patient Re-evaluated prior to induction Oxygen Delivery Method: Circle system utilized Preoxygenation: Pre-oxygenation with 100% oxygen Induction Type: IV induction Ventilation: Mask ventilation without difficulty Laryngoscope Size: McGraph and 3 Grade View: Grade I Tube type: Oral Tube size: 7.0 mm Number of attempts: 2 Airway Equipment and Method: Stylet and Video-laryngoscopy Placement Confirmation: positive ETCO2 and breath sounds checked- equal and bilateral Secured at: 23 cm Tube secured with: Tape Dental Injury: Teeth and Oropharynx as per pre-operative assessment

## 2018-08-13 NOTE — H&P (Signed)
[facility] History & Physical Update  The patient was interviewed and re-examined.  The patient's right leg is ischemic in the recovery room.  She will be taken back to the operating room for femoral exploration with thrombectomy and possible angiography.  The grave nature of the situation was discussed with the patient who is alert and she is agreeable to proceed.  We plan to proceed with the scheduled procedure.  [name], MD  08/13/2018, 2:53 PM

## 2018-08-13 NOTE — Anesthesia Postprocedure Evaluation (Signed)
Anesthesia Post Note  Patient: Heather Mahoney  Procedure(s) Performed: Right common femoral, profunda femoris, and superficial femoral artery endarterectomies and patch angioplasty  (Right Leg Upper) Right SFA and profunda femoris Fogarty embolectomy 3  Fogarty embolectomy balloon  Patient location during evaluation: PACU Anesthesia Type: General Level of consciousness: awake and alert Pain management: pain level controlled Vital Signs Assessment: post-procedure vital signs reviewed and stable Respiratory status: spontaneous breathing and respiratory function stable Cardiovascular status: stable Anesthetic complications: no     Last Vitals:  Vitals:   08/13/18 1755 08/13/18 1805  BP: 122/80 124/79  Pulse: 73 78  Resp: 15 15  Temp:    SpO2: 99% 100%    Last Pain:  Vitals:   08/13/18 1755  TempSrc:   PainSc: 0-No pain                 KEPHART,WILLIAM K

## 2018-08-13 NOTE — Anesthesia Preprocedure Evaluation (Addendum)
Anesthesia Evaluation  Patient identified by MRN, date of birth, ID band Patient awake    Reviewed: Allergy & Precautions, NPO status , Patient's Chart, lab work & pertinent test results, reviewed documented beta blocker date and time   History of Anesthesia Complications Negative for: history of anesthetic complications  Airway Mallampati: II  TM Distance: >3 FB     Dental  (+) Chipped   Pulmonary neg sleep apnea, neg COPD, Current Smoker,           Cardiovascular hypertension, Pt. on medications and Pt. on home beta blockers + Peripheral Vascular Disease  (-) Past MI and (-) CHF (-) dysrhythmias + Valvular Problems/Murmurs (murmur, no tx)      Neuro/Psych neg Headaches, neg Seizures    GI/Hepatic Neg liver ROS, hiatal hernia, GERD  ,  Endo/Other  neg diabetes  Renal/GU negative Renal ROS     Musculoskeletal   Abdominal   Peds  Hematology   Anesthesia Other Findings   Reproductive/Obstetrics                            Anesthesia Physical  Anesthesia Plan  ASA: III  Anesthesia Plan: General   Post-op Pain Management:    Induction: Intravenous  PONV Risk Score and Plan:   Airway Management Planned: Oral ETT  Additional Equipment:   Intra-op Plan:   Post-operative Plan:   Informed Consent: I have reviewed the patients History and Physical, chart, labs and discussed the procedure including the risks, benefits and alternatives for the proposed anesthesia with the patient or authorized representative who has indicated his/her understanding and acceptance.     Plan Discussed with: CRNA  Anesthesia Plan Comments:         Anesthesia Quick Evaluation

## 2018-08-13 NOTE — Progress Notes (Signed)
Dr. Delana Meyer paged to report pt's cont pain in her right foot. Skin is warm, but unable to palpate pulses.  Cont giving Fentanyl for pain and will add Dilaudid if needed.  PAD's bilateral groins remain at 40cc air.  Tender to touch around site, but no hematoma noted.   Dr. Delana Meyer after assessing pt at bs, decides to perform right femoral artery exploration later this afternoon.  Procedure explained to pt.  No family available here to update.  Bubba Camp, RN

## 2018-08-13 NOTE — Transfer of Care (Signed)
Immediate Anesthesia Transfer of Care Note  Patient: Heather Mahoney  Procedure(s) Performed: ENDOVASCULAR REPAIR/STENT GRAFT (N/A )  Patient Location: PACU  Anesthesia Type:General  Level of Consciousness: awake, alert , oriented and patient cooperative  Airway & Oxygen Therapy: Patient Spontanous Breathing and Patient connected to face mask oxygen  Post-op Assessment: Report given to RN and Post -op Vital signs reviewed and stable  Post vital signs: Reviewed and stable  Last Vitals:  Vitals Value Taken Time  BP    Temp    Pulse    Resp    SpO2      Last Pain:  Vitals:   08/13/18 1001  TempSrc: Oral  PainSc: 5          Complications: No apparent anesthesia complications

## 2018-08-13 NOTE — Op Note (Signed)
OPERATIVE NOTE   PROCEDURE: 1. Right common femoral, superficial femoral and profunda femoris endarterectomy with Cormatrix patch angioplasty 2. Right SFA and profunda femoris Fogarty embolectomy with a #3 Fogarty catheter  PRE-OPERATIVE DIAGNOSIS: Ischemic right leg status post endovascular aneurysm repair  POST-OPERATIVE DIAGNOSIS: Same  CO-SURGEON: Katha Cabal, MD and Algernon Huxley, M.D.  ASSISTANT(S): None  ANESTHESIA: general  ESTIMATED BLOOD LOSS: 300 cc  FINDING(S): 1. Profound calcific plaque noted of the right common femoral extending past the initial bifurcation of the profunda femoris arteries as well as down the extensive length of the SFA  SPECIMEN(S):  Calcific plaque from the common femoral, superficial femoral and the profunda femoris artery  INDICATIONS:   Heather Mahoney 52 y.o. y.o.female who presents with loss of pulses in the right lower extremity status post endovascular aneurysm repair. The patient has documented severe atherosclerotic occlusive disease and has undergone EVAR repair of her aneurysm earlier in the day.  Postoperatively I am unable to obtain Doppler signals in her right foot patient is complaining of intense pain.  The patient is therefore undergoing open right femoral exploration with probable endarterectomy. The risks and benefits of surgery have been reviewed with the patient, all questions have answered; alternative therapies have been reviewed as well and the patient has agreed to proceed with surgical open repair.  DESCRIPTION: After obtaining full informed written consent, the patient was brought back to the operating room and placed supine upon the operating table.  The patient received IV antibiotics prior to induction.  After obtaining adequate anesthesia, the patient was prepped and draped in the standard fashion for right femoral exposure.  Co-surgeons are required because this is a  complicated procedure with work being performed simultaneously from both the patient's right left sides.  This also expedites the procedure making a shorter operative time reducing complications and improving patient safety.  Attention was turned to the right groin with Dr. Lucky Cowboy working on the patient's right and myself working on the left of the patient.  Vertical  Incision was made over the right common femoral artery and dissection carried down to the common femoral artery with electrocautery.  The common femoral artery from the distal external iliac artery (identified by the superficial circumflex vessels) down to the femoral bifurcation was dissected circumferentially.  On initial inspection, the common femoral artery was: densely calcified and there was no palpable pulse noted.    Subsequently the dissection was continued to include all circumflex branches and the profunda femoral artery and superficial femoral artery. The superficial femoral artery was dissected circumferentially for a distance of approximately 1-2 cm and the profunda femoris was dissected circumferentially out to the fourth order branches individual vessel loops were placed around each branch.  Control of all branches was obtained with vessel loops.  A softer area in the distal external iliac artery amendable to clamping was identified.    The patient was given 5000 units of Heparin intravenously, which was a therapeutic bolus.   After waiting 3 minutes, the distal external iliac artery was clamped and all of the vessel loops were placed under tension.  Arteriotomy was made in the common femoral artery with a 11-blade and extended it with a Potts scissor proximally and distally extending the distal end down the SFA for approximately 2 cm.   Endarterectomy was then performed under direct visualization using a freer elevator and a right angle from the mid common femoral extending up both proximally and distally. Proximally the  endarterectomy was brought up to the level of the clamp where a clean edge was obtained. Distally the endarterectomy was carried down to a soft spot in the SFA where a feathered edge would was obtained.  7-0 Prolene interrupted tacking sutures were placed to secure the leading edge of the plaque in the SFA.  The profunda femoris was treated with an eversion technique extending endarterectomy approximately 2 cm distally again obtaining a featheredge on both sides right and left.   At this point poor back bleeding was noted from the SFA as well as the profunda femoris.  Therefore a #3 Fogarty embolectomy catheter was delivered onto the field and prepped.  Passes were made down each vessel.  Following passage of the embolectomy catheter backbleeding was now on identified.  At this point, a corematrix patch was fashioned for the geometry of the arteriotomy.  The patch was sewn to the artery with 2 running stitches of 6-0 Prolene, running from each end.  Prior to completing the patch angioplasty, the profunda femoral artery was flushed as was the superficial femoral artery. The system was then forward flushed. The endarterectomy site was then irrigated copiously with heparinized saline. The patch angioplasty was completed in the usual fashion.  Flow was then reestablished first to the profunda femoris and then the superficial femoral artery. Any gaps or bleeding sites in the suture line were easily controlled with a 6-0 Prolene suture. Doppler is then delivered onto the field and the SFA as well as the profunda femoris arteries were interrogated and found to have triphasic Doppler signals.  The right groin was then irrigated copiously with sterile saline and subsequently Evicel and Surgicel were placed in the wound. The incision was repaired with a double layer of 2-0 Vicryl, a double layer of 3-0 Vicryl, and a layer of 4-0 Monocryl in a subcuticular fashion.  The skin was cleaned, dried, and reinforced with  Dermabond.  COMPLICATIONS: None  CONDITION: Carlynn Purl, M.D.  [facility] Office: [phone]  08/13/2018, 5:56 PM

## 2018-08-13 NOTE — Anesthesia Procedure Notes (Signed)
Procedure Name: Intubation Date/Time: 08/13/2018 3:59 PM Performed by: Doreen Salvage, CRNA Pre-anesthesia Checklist: Patient identified, Patient being monitored, Timeout performed, Emergency Drugs available and Suction available Patient Re-evaluated:Patient Re-evaluated prior to induction Oxygen Delivery Method: Circle system utilized Preoxygenation: Pre-oxygenation with 100% oxygen Induction Type: IV induction Ventilation: Mask ventilation without difficulty Laryngoscope Size: Mac and 3 Grade View: Grade II Tube type: Oral Tube size: 7.0 mm Number of attempts: 1 Airway Equipment and Method: Stylet Placement Confirmation: ETT inserted through vocal cords under direct vision,  positive ETCO2 and breath sounds checked- equal and bilateral Secured at: 21 cm Tube secured with: Tape Dental Injury: Teeth and Oropharynx as per pre-operative assessment

## 2018-08-14 DIAGNOSIS — I714 Abdominal aortic aneurysm, without rupture: Principal | ICD-10-CM

## 2018-08-14 LAB — GLUCOSE, CAPILLARY: GLUCOSE-CAPILLARY: 164 mg/dL — AB (ref 70–99)

## 2018-08-14 LAB — CBC
HCT: 32.4 % — ABNORMAL LOW (ref 36.0–46.0)
HEMOGLOBIN: 10.8 g/dL — AB (ref 12.0–15.0)
MCH: 27.5 pg (ref 26.0–34.0)
MCHC: 33.3 g/dL (ref 30.0–36.0)
MCV: 82.4 fL (ref 80.0–100.0)
NRBC: 0 % (ref 0.0–0.2)
Platelets: 383 10*3/uL (ref 150–400)
RBC: 3.93 MIL/uL (ref 3.87–5.11)
RDW: 15.9 % — AB (ref 11.5–15.5)
WBC: 17.1 10*3/uL — ABNORMAL HIGH (ref 4.0–10.5)

## 2018-08-14 LAB — BASIC METABOLIC PANEL
Anion gap: 11 (ref 5–15)
BUN: 13 mg/dL (ref 6–20)
CALCIUM: 9.2 mg/dL (ref 8.9–10.3)
CO2: 21 mmol/L — ABNORMAL LOW (ref 22–32)
CREATININE: 0.69 mg/dL (ref 0.44–1.00)
Chloride: 108 mmol/L (ref 98–111)
GFR calc non Af Amer: >60 mL/min (ref >60)
Glucose, Bld: 121 mg/dL — ABNORMAL HIGH (ref 70–99)
Potassium: 5 mmol/L (ref 3.5–5.1)
Sodium: 140 mmol/L (ref 135–145)

## 2018-08-14 LAB — PHOSPHORUS: PHOSPHORUS: 4 mg/dL (ref 2.5–4.6)

## 2018-08-14 LAB — MRSA PCR SCREENING: MRSA BY PCR: NEGATIVE

## 2018-08-14 LAB — MAGNESIUM: Magnesium: 1.8 mg/dL (ref 1.7–2.4)

## 2018-08-14 MED ORDER — LABETALOL HCL 5 MG/ML IV SOLN: 10.0000 mg | INTRAVENOUS | Status: DC | PRN

## 2018-08-14 MED ORDER — OXYCODONE-ACETAMINOPHEN 5-325 MG PO TABS
1.0000 | ORAL_TABLET | ORAL | Status: DC | PRN
  Administered 2018-08-14 – 2018-08-15 (×3): 1 via ORAL
  Filled 2018-08-14 (×3): qty 1

## 2018-08-14 MED ORDER — METOPROLOL TARTRATE 5 MG/5ML IV SOLN: 2.0000 mg | INTRAVENOUS | Status: DC | PRN

## 2018-08-14 MED ORDER — SODIUM CHLORIDE 0.9 % IV SOLN: 500.0000 mL | Freq: Once | INTRAVENOUS | Status: DC | PRN

## 2018-08-14 MED ORDER — ACETAMINOPHEN 325 MG PO TABS: 325.0000 mg | ORAL_TABLET | ORAL | Status: DC | PRN

## 2018-08-14 MED ORDER — ALUM & MAG HYDROXIDE-SIMETH 200-200-20 MG/5ML PO SUSP
15.0000 mL | ORAL | Status: DC | PRN
  Filled 2018-08-14: qty 30

## 2018-08-14 MED ORDER — HYDRALAZINE HCL 20 MG/ML IJ SOLN
10.0000 mg | INTRAMUSCULAR | Status: DC | PRN
  Administered 2018-08-14: 10 mg via INTRAVENOUS

## 2018-08-14 MED ORDER — ACETAMINOPHEN 650 MG RE SUPP: 325.0000 mg | RECTAL | Status: DC | PRN

## 2018-08-14 MED ORDER — DOCUSATE SODIUM 100 MG PO CAPS
100.0000 mg | ORAL_CAPSULE | Freq: Every day | ORAL | Status: DC
  Administered 2018-08-14 – 2018-08-15 (×2): 100 mg via ORAL
  Filled 2018-08-14 (×2): qty 1

## 2018-08-14 MED ORDER — ASPIRIN EC 81 MG PO TBEC
81.0000 mg | DELAYED_RELEASE_TABLET | Freq: Every day | ORAL | Status: DC
  Administered 2018-08-14 – 2018-08-15 (×2): 81 mg via ORAL
  Filled 2018-08-14 (×2): qty 1

## 2018-08-14 MED ORDER — FENTANYL CITRATE (PF) 100 MCG/2ML IJ SOLN: 25.0000 ug | Freq: Once | INTRAMUSCULAR | Status: DC

## 2018-08-14 MED ORDER — METOCLOPRAMIDE HCL 5 MG/ML IJ SOLN
5.0000 mg | Freq: Four times a day (QID) | INTRAMUSCULAR | Status: DC | PRN
  Administered 2018-08-14 – 2018-08-15 (×2): 5 mg via INTRAVENOUS
  Filled 2018-08-14 (×2): qty 2

## 2018-08-14 MED ORDER — SODIUM CHLORIDE 0.9 % IV SOLN: INTRAVENOUS | Status: DC

## 2018-08-14 MED ORDER — DOPAMINE-DEXTROSE 3.2-5 MG/ML-% IV SOLN: 3.0000 ug/kg/min | INTRAVENOUS | Status: DC

## 2018-08-14 MED ORDER — POTASSIUM CHLORIDE CRYS ER 20 MEQ PO TBCR: 20.0000 meq | EXTENDED_RELEASE_TABLET | Freq: Every day | ORAL | Status: DC | PRN

## 2018-08-14 MED ORDER — MORPHINE SULFATE (PF) 2 MG/ML IV SOLN
2.0000 mg | INTRAVENOUS | Status: DC | PRN
  Filled 2018-08-14: qty 1

## 2018-08-14 MED ORDER — AMLODIPINE BESYLATE 10 MG PO TABS
10.0000 mg | ORAL_TABLET | Freq: Every day | ORAL | Status: DC
  Administered 2018-08-14 – 2018-08-15 (×2): 10 mg via ORAL
  Filled 2018-08-14 (×2): qty 1

## 2018-08-14 MED ORDER — NITROGLYCERIN IN D5W 200-5 MCG/ML-% IV SOLN: 5.0000 ug/min | INTRAVENOUS | Status: DC

## 2018-08-14 MED ORDER — MORPHINE SULFATE (PF) 2 MG/ML IV SOLN
2.0000 mg | INTRAVENOUS | Status: DC | PRN
  Administered 2018-08-14: 2 mg via INTRAVENOUS

## 2018-08-14 MED ORDER — CLOPIDOGREL BISULFATE 75 MG PO TABS
75.0000 mg | ORAL_TABLET | Freq: Every day | ORAL | Status: DC
  Administered 2018-08-14 – 2018-08-15 (×2): 75 mg via ORAL
  Filled 2018-08-14 (×2): qty 1

## 2018-08-14 MED ORDER — MAGNESIUM SULFATE 2 GM/50ML IV SOLN: 2.0000 g | Freq: Every day | INTRAVENOUS | Status: DC | PRN

## 2018-08-14 MED ORDER — HYDRALAZINE HCL 20 MG/ML IJ SOLN: 5.0000 mg | INTRAMUSCULAR | Status: DC | PRN

## 2018-08-14 MED ORDER — GUAIFENESIN-DM 100-10 MG/5ML PO SYRP
15.0000 mL | ORAL_SOLUTION | ORAL | Status: DC | PRN
  Filled 2018-08-14: qty 15

## 2018-08-14 MED ORDER — PHENOL 1.4 % MT LIQD: 1.0000 | OROMUCOSAL | Status: DC | PRN

## 2018-08-14 MED ORDER — MORPHINE SULFATE (PF) 4 MG/ML IV SOLN: 5.0000 mg | INTRAVENOUS | Status: DC | PRN

## 2018-08-14 MED ORDER — HYDRALAZINE HCL 20 MG/ML IJ SOLN: 10.0000 mg | INTRAMUSCULAR | Status: DC | PRN

## 2018-08-14 NOTE — Progress Notes (Signed)
[place] [facility] [place] NAME: Heather Mahoney    MR#:  [id number]  DATE OF BIRTH:  02-15-1966  SUBJECTIVE:  CHIEF COMPLAINT:   Chief Complaint  Patient presents with  . Pelvic Pain   The patient is status post AAA repair, had right leg ischemia and thrombectomy and endarterectomy was done.  Much better today.  REVIEW OF SYSTEMS:  Review of Systems  Constitutional: Negative for fever and malaise/fatigue.  HENT: Negative for congestion, ear pain, hearing loss, nosebleeds and tinnitus.   Eyes: Negative for blurred vision and photophobia.  Respiratory: Negative for cough, sputum production, shortness of breath and wheezing.   Cardiovascular: Negative for chest pain, orthopnea and leg swelling.  Gastrointestinal: Negative for abdominal pain, blood in stool, heartburn, nausea and vomiting.  Genitourinary: Negative for frequency.  Musculoskeletal: Negative for back pain and myalgias.  Skin: Negative for itching and rash.  Neurological: Negative for dizziness, tremors, focal weakness, loss of consciousness and headaches.  Psychiatric/Behavioral: Negative for depression and substance abuse.    DRUG ALLERGIES:   Allergies  Allergen Reactions  . Strawberry Extract Hives and Swelling  . Strawberry Flavor Rash   VITALS:  Blood pressure (!) 152/98, pulse 94, temperature 98.4 F (36.9 C), temperature source Oral, resp. rate (!) 21, height 5\' 7"  (1.702 m), weight 59.4 kg, SpO2 100 %. PHYSICAL EXAMINATION:  Physical Exam  Constitutional: She is oriented to person, place, and time. No distress.  HENT:  Head: Normocephalic.  Mouth/Throat: Oropharynx is clear and moist.  Eyes: Pupils are equal, round, and reactive to light. Conjunctivae and EOM are normal. No scleral icterus.  Neck: Neck supple. No JVD present. No tracheal deviation present.  Cardiovascular: Normal rate, regular rhythm and normal heart sounds. Exam reveals no gallop.  No murmur  heard. Pulmonary/Chest: Effort normal and breath sounds normal. No respiratory distress. She has no wheezes. She has no rales.  Abdominal: Soft. Bowel sounds are normal. She exhibits no distension. There is no tenderness. There is no rebound.  Musculoskeletal: She exhibits no edema or tenderness.  Neurological: She is alert and oriented to person, place, and time. She has normal strength. No cranial nerve deficit or sensory deficit.  Skin: No rash noted. No erythema.  Psychiatric: She has a normal mood and affect. Her behavior is normal.   LABORATORY PANEL:  Female CBC Recent Labs  Lab 08/14/18 0536  WBC 17.1*  HGB 10.8*  HCT 32.4*  PLT 383   ------------------------------------------------------------------------------------------------------------------ Chemistries  Recent Labs  Lab 08/14/18 0536  NA 140  K 5.0  CL 108  CO2 21*  GLUCOSE 121*  BUN 13  CREATININE 0.69  CALCIUM 9.2  MG 1.8   RADIOLOGY:  No results found. ASSESSMENT AND PLAN:   AAA (abdominal aortic aneurysm) ([place]):   S/P AAA repair. Pain control.  Transferred to ICU to monitor per Dr. Delana Meyer.  Acute right leg ischemia.  The patient is taken back to OR for femoral exploration with thrombectomy and angiograph per Dr. Lucky Cowboy.   Hypertension malignancy.  Better controlled. Off nicardipine gtt, continue lopressor and losartan, added norvasc, IV hydralazine.  Hyperlipidemia: home medication continued.  Chronic thrombocytopenia.  Unclear etiology.  Stable.  Tobacco abuse.  Smoking cessation was counseled for 3 to 4 minutes.  The patient wants to quit.  On nicotine patch.  All the records are reviewed and case discussed with Care Management/Social Worker. Management plans discussed with the patient, family and they are in agreement.  CODE STATUS: Full Code  TOTAL TIME TAKING CARE OF THIS PATIENT: 32 minutes.   More than 50% of the time was spent in counseling/coordination of care: YES  POSSIBLE D/C IN  1-2 DAYS, DEPENDING ON CLINICAL CONDITION.   Vaughan Basta M.D on 08/14/2018 at 4:26 PM  Between 7am to 6pm - Pager - [phone]  After 6pm go to [email or web address] - [facility]

## 2018-08-14 NOTE — Progress Notes (Signed)
[facility]  Daily Progress Note   Subjective  - 1 Day Post-Op  Doing well this am.  No leg complaints but sore all over  Objective Vitals:   08/14/18 0900 08/14/18 1000 08/14/18 1100 08/14/18 1200  BP: (!) 147/92 (!) 162/89 (!) 175/86 (!) 152/98  Pulse: (!) 105 86 67 94  Resp: 20 16 14  (!) 21  Temp:      TempSrc:      SpO2:      Weight:      Height:        Intake/Output Summary (Last 24 hours) at 08/14/2018 1205 Last data filed at 08/14/2018 0900 Gross per 24 hour  Intake 2020 ml  Output 3970 ml  Net -1950 ml    PULM  CTAB CV  RRR VASC  Feet warm with soft pulses present.  Incisions C/D/I  Laboratory CBC    Component Value Date/Time   WBC 17.1 (H) 08/14/2018 0536   HGB 10.8 (L) 08/14/2018 0536   HGB 12.4 12/12/2015 1525   HCT 32.4 (L) 08/14/2018 0536   HCT 37.8 12/12/2015 1525   PLT 383 08/14/2018 0536   PLT 443 (H) 12/12/2015 1525    BMET    Component Value Date/Time   NA 140 08/14/2018 0536   K 5.0 08/14/2018 0536   CL 108 08/14/2018 0536   CO2 21 (L) 08/14/2018 0536   GLUCOSE 121 (H) 08/14/2018 0536   BUN 13 08/14/2018 0536   CREATININE 0.69 08/14/2018 0536   CALCIUM 9.2 08/14/2018 0536   GFRNONAA >60 08/14/2018 0536   GFRAA >60 08/14/2018 0536    Assessment/Planning: POD #1 s/p AAA, right femoral endarterectomy/embolectomy   Doing ok  OK to go to the floor  Advance diet and activity. Would ambulate with assistance  Likely discharge stable in 1-2 days    [name]  08/14/2018, 12:05 PM

## 2018-08-14 NOTE — Plan of Care (Addendum)
Patient alert and oriented. Able to makes needs known.  Patient continues with PAD 40 mls to left femoral.  To be removed x 24 hours at 1pm.  Right femoral has incision closed with dermabond.  No drainage noted. No hematomas noted. Positive peripheral pulses.  PRN pain med given x 1 with fair effect.  Patient had emesis x 2, moderate amount.  Patient remains on clear liquids at this time. Afebrile. C/o of lower back pain. Warm blanket given for comfort.  Will continue to monitor.

## 2018-08-14 NOTE — Progress Notes (Signed)
   Name: Heather Mahoney MRN: [id number] DOB: 08/17/1966     CONSULTATION DATE: 08/10/2018  subjective & objective: Patient postoperative day 1 endovascular repair of AAA and right common femoral profunda femoris and superficial femoral artery exploration endarterectomy with patch angioplasty.  PAST MEDICAL HISTORY :   has a past medical history of GERD (gastroesophageal reflux disease), Heart murmur, Hiatal hernia, Hyperlipidemia, Hypertension, and Tobacco abuse.  has a past surgical history that includes Breast surgery (1986) and Salpingoophorectomy (Left, 2000). Prior to Admission medications   Medication Sig Start Date End Date Taking? Authorizing Provider  albuterol (PROVENTIL HFA;VENTOLIN HFA) 108 (90 Base) MCG/ACT inhaler Inhale 2 puffs into the lungs every 6 (six) hours as needed for wheezing or shortness of breath. 01/11/16  Yes Lavonia Drafts, MD  atorvastatin (LIPITOR) 20 MG tablet Take 1 tablet (20 mg total) by mouth daily. 12/18/17  Yes Gollan, Kathlene November, MD  ibuprofen (ADVIL,MOTRIN) 600 MG tablet Take 1 tablet (600 mg total) by mouth every 8 (eight) hours as needed. 01/05/18  Yes Sable Feil, PA-C  losartan (COZAAR) 100 MG tablet Take 1 tablet (100 mg total) by mouth daily. 12/18/17  Yes Minna Merritts, MD  metoprolol succinate (TOPROL-XL) 50 MG 24 hr tablet Take 1 tablet (50 mg total) by mouth daily. 12/18/17  Yes Minna Merritts, MD  naproxen sodium (ALEVE) 220 MG tablet Take 220 mg by mouth 2 (two) times daily as needed (pain).   Yes [provider]  brompheniramine-pseudoephedrine-DM 30-2-10 MG/5ML syrup Take 5 mLs by mouth 4 (four) times daily as needed. Patient not taking: Reported on 08/10/2018 01/05/18   Sable Feil, PA-C   Allergies  Allergen Reactions  . Strawberry Extract Hives and Swelling  . Strawberry Flavor Rash    FAMILY HISTORY:  family history includes Asthma in her mother; Diabetes in her maternal grandmother and mother; Heart attack in her  mother; Heart disease in her maternal grandmother and mother; Hyperlipidemia in her maternal grandmother and mother; Hypertension in her mother; Sarcoidosis in her mother. SOCIAL HISTORY:  reports that she has been smoking cigarettes. She has a 9.50 pack-year smoking history. She has never used smokeless tobacco. She reports that she drinks alcohol. She reports that she has current or past drug history. Drug: Marijuana.  REVIEW OF SYSTEMS:   Unable to obtain due to critical illness   VITAL SIGNS: Temp:  [96.3 F (35.7 C)-98.4 F (36.9 C)] 98.4 F (36.9 C) (10/26 0400) Pulse Rate:  [58-105] 94 (10/26 1200) Resp:  [12-25] 21 (10/26 1200) BP: (115-191)/(71-98) 152/98 (10/26 1200) SpO2:  [89 %-100 %] 100 % (10/26 0500) Weight:  [59.4 kg] 59.4 kg (10/26 0154)  Physical Examination:  Awake and oriented with no acute focal neurological deficits Tolerating room air, no distress, able to talk in full sentences, bilateral equal air entry with no rales S1 & S2 are audible with no murmur Benign abdominal exam with normal peristalsis No leg edema was palpable PP   ASSESSMENT / PLAN:  AAA.  Status post endovascular repair complicated with acute ischemia right lower extremity status post right common femoral profunda femoris and superficial femoral artery exploration endarterectomy with patch angioplasty.  Aspirin + Plavix -Management as per vascular surgery  Hypertension -Mild antihypertensives and monitor hemodynamics  Dyslipidemia -Statin  GERD -Famotidine  Full code  Supportive care  Critical care time 35 minutes

## 2018-08-15 LAB — CBC WITH DIFFERENTIAL/PLATELET
ABS IMMATURE GRANULOCYTES: 0.1 10*3/uL — AB (ref 0.00–0.07)
BASOS ABS: 0 10*3/uL (ref 0.0–0.1)
Basophils Relative: 0 %
EOS ABS: 0 10*3/uL (ref 0.0–0.5)
Eosinophils Relative: 0 %
HCT: 32.5 % — ABNORMAL LOW (ref 36.0–46.0)
Hemoglobin: 10.7 g/dL — ABNORMAL LOW (ref 12.0–15.0)
IMMATURE GRANULOCYTES: 1 %
Lymphocytes Relative: 20 %
Lymphs Abs: 3.1 10*3/uL (ref 0.7–4.0)
MCH: 27.6 pg (ref 26.0–34.0)
MCHC: 32.9 g/dL (ref 30.0–36.0)
MCV: 83.8 fL (ref 80.0–100.0)
Monocytes Absolute: 1.5 10*3/uL — ABNORMAL HIGH (ref 0.1–1.0)
Monocytes Relative: 10 %
NEUTROS ABS: 11.1 10*3/uL — AB (ref 1.7–7.7)
NEUTROS PCT: 69 %
NRBC: 0 % (ref 0.0–0.2)
PLATELETS: 375 10*3/uL (ref 150–400)
RBC: 3.88 MIL/uL (ref 3.87–5.11)
RDW: 15.9 % — AB (ref 11.5–15.5)
WBC: 15.9 10*3/uL — AB (ref 4.0–10.5)

## 2018-08-15 LAB — BASIC METABOLIC PANEL
ANION GAP: 14 (ref 5–15)
BUN: 14 mg/dL (ref 6–20)
CALCIUM: 9 mg/dL (ref 8.9–10.3)
CO2: 23 mmol/L (ref 22–32)
Chloride: 100 mmol/L (ref 98–111)
Creatinine, Ser: 0.76 mg/dL (ref 0.44–1.00)
Glucose, Bld: 102 mg/dL — ABNORMAL HIGH (ref 70–99)
POTASSIUM: 3.5 mmol/L (ref 3.5–5.1)
SODIUM: 137 mmol/L (ref 135–145)

## 2018-08-15 LAB — PHOSPHORUS: Phosphorus: 3.8 mg/dL (ref 2.5–4.6)

## 2018-08-15 LAB — MAGNESIUM: MAGNESIUM: 2 mg/dL (ref 1.7–2.4)

## 2018-08-15 MED ORDER — CLOPIDOGREL BISULFATE 75 MG PO TABS: 75.0000 mg | ORAL_TABLET | Freq: Every day | ORAL | 0 refills | Status: DC

## 2018-08-15 MED ORDER — OXYCODONE-ACETAMINOPHEN 5-325 MG PO TABS: 1.0000 | ORAL_TABLET | ORAL | 0 refills | Status: DC | PRN

## 2018-08-15 MED ORDER — ASPIRIN 81 MG PO TBEC: 81.0000 mg | DELAYED_RELEASE_TABLET | Freq: Every day | ORAL | 0 refills | Status: DC

## 2018-08-15 MED ORDER — NICOTINE 14 MG/24HR TD PT24: 14.0000 mg | MEDICATED_PATCH | Freq: Every day | TRANSDERMAL | 0 refills | Status: DC

## 2018-08-15 MED ORDER — AMLODIPINE BESYLATE 10 MG PO TABS: 10.0000 mg | ORAL_TABLET | Freq: Every day | ORAL | 0 refills | Status: DC

## 2018-08-15 MED ORDER — DOCUSATE SODIUM 100 MG PO CAPS: 100.0000 mg | ORAL_CAPSULE | Freq: Every day | ORAL | 0 refills | Status: DC

## 2018-08-15 MED ORDER — PROMETHAZINE HCL 25 MG/ML IJ SOLN: 25.0000 mg | Freq: Four times a day (QID) | INTRAMUSCULAR | Status: DC | PRN

## 2018-08-15 NOTE — Plan of Care (Signed)
  Problem: Activity: Goal: Risk for activity intolerance will decrease Outcome: Progressing   Problem: Pain Managment: Goal: General experience of comfort will improve Outcome: Progressing   

## 2018-08-15 NOTE — Discharge Summary (Signed)
[facility] NAME: Heather Mahoney    MR#:  [id number]  DATE OF BIRTH:  Aug 07, 1966  DATE OF ADMISSION:  08/10/2018 ADMITTING PHYSICIAN: Lance Coon, MD  DATE OF DISCHARGE: 08/15/2018   PRIMARY CARE PHYSICIAN: Patient, No Pcp Per    ADMISSION DIAGNOSIS:  Abdominal aortic aneurysm (AAA) >39 mm diameter (HCC) [I71.4] AAA (abdominal aortic aneurysm) ([place]) [I71.4]  DISCHARGE DIAGNOSIS:  Principal Problem:   AAA (abdominal aortic aneurysm) (HCC) Active Problems:   Hypertension   Hyperlipidemia   Tobacco use disorder   SECONDARY DIAGNOSIS:   Past Medical History:  Diagnosis Date  . GERD (gastroesophageal reflux disease)   . Heart murmur   . Hiatal hernia   . Hyperlipidemia   . Hypertension    On Hydrochlorothiazide  . Tobacco abuse    a. Started at age 56, quit during 3 pregnancies. b. 1 PPD for a 40 pack-year hx (2019)     HOSPITAL COURSE:   AAA (abdominal aortic aneurysm) ([place]):  S/P AAA repair. Pain control.  Transferred to ICU to monitor per Dr. Delana Meyer.  Improved and stable.  Acute right leg ischemia.  The patient is taken back to OR for femoral exploration with thrombectomy and angiograph per Dr. Lucky Cowboy.  ASA and plavix per vascular on discharge.   Hypertension malignancy.  Better controlled. Off nicardipine gtt, continue lopressor and losartan, added norvasc, IV hydralazine.  Hyperlipidemia: home medication continued.  Chronic thrombocytopenia.  Unclear etiology.  Stable.  Tobacco abuse.  Smoking cessation was counseled for 3 to 4 minutes.  The patient wants to quit.  On nicotine patch.  DISCHARGE CONDITIONS:   Stable.  CONSULTS OBTAINED:  Treatment Team:  Katha Cabal, MD  DRUG ALLERGIES:   Allergies  Allergen Reactions  . Strawberry Extract Hives and Swelling  . Strawberry Flavor Rash    DISCHARGE MEDICATIONS:   Allergies as of 08/15/2018      Reactions   Strawberry  Extract Hives, Swelling   Strawberry Flavor Rash      Medication List    STOP taking these medications   brompheniramine-pseudoephedrine-DM 30-2-10 MG/5ML syrup   naproxen sodium 220 MG tablet Commonly known as:  ALEVE     TAKE these medications   albuterol 108 (90 Base) MCG/ACT inhaler Commonly known as:  PROVENTIL HFA;VENTOLIN HFA Inhale 2 puffs into the lungs every 6 (six) hours as needed for wheezing or shortness of breath.   amLODipine 10 MG tablet Commonly known as:  NORVASC Take 1 tablet (10 mg total) by mouth daily. Start taking on:  08/16/2018   aspirin 81 MG EC tablet Take 1 tablet (81 mg total) by mouth daily. Start taking on:  08/16/2018   atorvastatin 20 MG tablet Commonly known as:  LIPITOR Take 1 tablet (20 mg total) by mouth daily.   clopidogrel 75 MG tablet Commonly known as:  PLAVIX Take 1 tablet (75 mg total) by mouth daily. Start taking on:  08/16/2018   docusate sodium 100 MG capsule Commonly known as:  COLACE Take 1 capsule (100 mg total) by mouth daily. Start taking on:  08/16/2018   ibuprofen 600 MG tablet Commonly known as:  ADVIL,MOTRIN Take 1 tablet (600 mg total) by mouth every 8 (eight) hours as needed.   losartan 100 MG tablet Commonly known as:  COZAAR Take 1 tablet (100 mg total) by mouth daily.   metoprolol succinate 50 MG 24 hr tablet Commonly known as:  TOPROL-XL Take 1 tablet (  50 mg total) by mouth daily.   nicotine 14 mg/24hr patch Commonly known as:  NICODERM CQ - dosed in mg/24 hours Place 1 patch (14 mg total) onto the skin daily. Start taking on:  08/16/2018   oxyCODONE-acetaminophen 5-325 MG tablet Commonly known as:  PERCOCET/ROXICET Take 1-2 tablets by mouth every 4 (four) hours as needed for moderate pain or severe pain.        DISCHARGE INSTRUCTIONS:    Follow with vascular clinic in 1 week.  If you experience worsening of your admission symptoms, develop shortness of breath, life threatening  emergency, suicidal or homicidal thoughts you must seek medical attention immediately by calling 911 or calling your MD immediately  if symptoms less severe.  You Must read complete instructions/literature along with all the possible adverse reactions/side effects for all the Medicines you take and that have been prescribed to you. Take any new Medicines after you have completely understood and accept all the possible adverse reactions/side effects.   Please note  You were cared for by a hospitalist during your hospital stay. If you have any questions about your discharge medications or the care you received while you were in the hospital after you are discharged, you can call the unit and asked to speak with the hospitalist on call if the hospitalist that took care of you is not available. Once you are discharged, your primary care physician will handle any further medical issues. Please note that NO REFILLS for any discharge medications will be authorized once you are discharged, as it is imperative that you return to your primary care physician (or establish a relationship with a primary care physician if you do not have one) for your aftercare needs so that they can reassess your need for medications and monitor your lab values.    Today   CHIEF COMPLAINT:   Chief Complaint  Patient presents with  . Pelvic Pain    HISTORY OF PRESENT ILLNESS:  Heather Mahoney  is a 52 y.o. female describes suprapubic bilateral 10/10 sharp groin pain more prominent on Left.  Patient's BP >200/110, controlled on nicardipine drip.  Patient states this began Monday evening and has been getting progressively worse, tonight she is unable to reposition without sharp pain.  Patient also described right lower extremity pain on Sunday evening sharp that encompassed entire leg, this pain went away with rest and heat.  Patient states she has been experiencing bilateral lower extremity pain intermittently with  walking/activity, worse on the right.  ED CT showed an infrarenal AAA that had grown from 3.3 to 4.1 cm in 2 years.  Dr. Alfred Levins confirmed consulting Dr. Ronalee Belts from vascular surgery who is on his way to see the patient.  Hospitalist called for admission would appreciate vascular surgery's recommendations.  VITAL SIGNS:  Blood pressure (!) 140/92, pulse (!) 110, temperature 99.5 F (37.5 C), temperature source Oral, resp. rate 15, height 5\' 7"  (1.702 m), weight 63.3 kg, SpO2 100 %.  I/O:    Intake/Output Summary (Last 24 hours) at 08/15/2018 1302 Last data filed at 08/14/2018 2100 Gross per 24 hour  Intake 480 ml  Output 1350 ml  Net -870 ml    PHYSICAL EXAMINATION:  GENERAL:  52 y.o.-year-old patient lying in the bed with no acute distress.  EYES: Pupils equal, round, reactive to light and accommodation. No scleral icterus. Extraocular muscles intact.  HEENT: Head atraumatic, normocephalic. Oropharynx and nasopharynx clear.  NECK:  Supple, no jugular venous distention. No thyroid enlargement, no  tenderness.  LUNGS: Normal breath sounds bilaterally, no wheezing, rales,rhonchi or crepitation. No use of accessory muscles of respiration.  CARDIOVASCULAR: S1, S2 normal. No murmurs, rubs, or gallops.  ABDOMEN: Soft, non-tender, non-distended. Bowel sounds present. No organomegaly or mass. Right groin surgical stiches present, little tender, no hematome. EXTREMITIES: No pedal edema, cyanosis, or clubbing. Good pulses on right dorsalis pedis. NEUROLOGIC: Cranial nerves II through XII are intact. Muscle strength 5/5 in all extremities. Sensation intact. Gait not checked.  PSYCHIATRIC: The patient is alert and oriented x 3.  SKIN: No obvious rash, lesion, or ulcer.   DATA REVIEW:   CBC Recent Labs  Lab 08/15/18 0551  WBC 15.9*  HGB 10.7*  HCT 32.5*  PLT 375    Chemistries  Recent Labs  Lab 08/15/18 0551  NA 137  K 3.5  CL 100  CO2 23  GLUCOSE 102*  BUN 14  CREATININE  0.76  CALCIUM 9.0  MG 2.0    Cardiac Enzymes No results for input(s): TROPONINI in the last 168 hours.  Microbiology Results  Results for orders placed or performed during the hospital encounter of 08/10/18  MRSA PCR Screening     Status: None   Collection Time: 08/10/18 10:43 PM  Result Value Ref Range Status   MRSA by PCR NEGATIVE NEGATIVE Final    Comment:        The GeneXpert MRSA Assay (FDA approved for NASAL specimens only), is one component of a comprehensive MRSA colonization surveillance program. It is not intended to diagnose MRSA infection nor to guide or monitor treatment for MRSA infections. Performed at [facility], [address] [place], [place] [address]   MRSA PCR Screening     Status: None   Collection Time: 08/14/18 12:53 AM  Result Value Ref Range Status   MRSA by PCR NEGATIVE NEGATIVE Final    Comment:        The GeneXpert MRSA Assay (FDA approved for NASAL specimens only), is one component of a comprehensive MRSA colonization surveillance program. It is not intended to diagnose MRSA infection nor to guide or monitor treatment for MRSA infections. Performed at [facility], [address] [place], [place] [address]     RADIOLOGY:  No results found.  EKG:   Orders placed or performed in visit on 12/18/17  . EKG 12-Lead     Management plans discussed with the patient, family and they are in agreement.  CODE STATUS:     Code Status Orders  (From admission, onward)         Start     Ordered   08/10/18 2200  Full code  Continuous     08/10/18 2159        Code Status History    This patient has a current code status but no historical code status.      TOTAL TIME TAKING CARE OF THIS PATIENT: 35 minutes.    Vaughan Basta M.D on 08/15/2018 at 1:02 PM  Between 7am to 6pm - Pager - [phone]  After 6pm go to [email or web address] - password [email or web address] [facility]  Office   [phone]  CC: Primary care physician; Patient, No Pcp Per   Note: This dictation was prepared with Dragon dictation along with smaller phrase technology. Any transcriptional errors that result from this process are unintentional.

## 2018-08-15 NOTE — Plan of Care (Signed)
Patient alert and oriented. Able to make needs known.  Transferred to 234.  Report given to nurse.  PRN medication given x 1 for 'all over' pain.

## 2018-08-15 NOTE — Progress Notes (Signed)
[facility]  Daily Progress Note   Subjective  - 2 Days Post-Op  Feeling better.  Some right groin soreness  Objective Vitals:   08/15/18 0300 08/15/18 0400 08/15/18 0513 08/15/18 0759  BP: 109/63 122/74 (!) 145/94 (!) 140/92  Pulse: 63 66 90 (!) 110  Resp: 14 15    Temp:   99.8 F (37.7 C) 99.5 F (37.5 C)  TempSrc:   Oral Oral  SpO2: 99% 99% 95% 100%  Weight:   63.3 kg   Height:   5\' 7"  (1.702 m)     Intake/Output Summary (Last 24 hours) at 08/15/2018 1522 Last data filed at 08/15/2018 1418 Gross per 24 hour  Intake 720 ml  Output -  Net 720 ml    PULM  CTAB CV  RRR VASC  Feet warm with soft pulses, incision C/D/I  Laboratory CBC    Component Value Date/Time   WBC 15.9 (H) 08/15/2018 0551   HGB 10.7 (L) 08/15/2018 0551   HGB 12.4 12/12/2015 1525   HCT 32.5 (L) 08/15/2018 0551   HCT 37.8 12/12/2015 1525   PLT 375 08/15/2018 0551   PLT 443 (H) 12/12/2015 1525    BMET    Component Value Date/Time   NA 137 08/15/2018 0551   K 3.5 08/15/2018 0551   CL 100 08/15/2018 0551   CO2 23 08/15/2018 0551   GLUCOSE 102 (H) 08/15/2018 0551   BUN 14 08/15/2018 0551   CREATININE 0.76 08/15/2018 0551   CALCIUM 9.0 08/15/2018 0551   GFRNONAA >60 08/15/2018 0551   GFRAA >60 08/15/2018 0551    Assessment/Planning: POD #2 s/p AAA and right femoral endarterectomy/embolectomy   OK for discharge  Follow up with our NP in 2 weeks in office  Out of work for two weeks  D/c home on ASA and Plavix    [name]  08/15/2018, 3:22 PM

## 2018-08-15 NOTE — Progress Notes (Signed)
Patient has been discharged home this afternoon after being assessed by Dr. Lucky Cowboy and cleared for discharge. IV removed, VSS, no complaints of pain at time of d/c. Patient has been provided with prescriptions and medication regimen information. Patient also educated on femoral endarcterectomy site care and follow up appointments.

## 2018-08-15 NOTE — Discharge Instructions (Signed)
You received your Flu vaccine today.

## 2018-08-16 ENCOUNTER — Encounter: Payer: Self-pay | Admitting: Vascular Surgery

## 2018-08-16 LAB — POCT PREGNANCY, URINE: Preg Test, Ur: NEGATIVE

## 2018-08-17 LAB — SURGICAL PATHOLOGY

## 2018-08-19 ENCOUNTER — Telehealth: Payer: Self-pay | Admitting: Pharmacy Technician

## 2018-08-19 NOTE — Telephone Encounter (Signed)
Provided patient with new patient packet to obtain [facility] services.  Patient understands that [facility] must receive 2019 financial documentation in order to receive medication.  [facility]

## 2018-08-29 ENCOUNTER — Encounter: Payer: Self-pay | Admitting: Vascular Surgery

## 2018-08-30 ENCOUNTER — Ambulatory Visit (INDEPENDENT_AMBULATORY_CARE_PROVIDER_SITE_OTHER): Payer: Self-pay | Admitting: Nurse Practitioner

## 2018-08-30 ENCOUNTER — Encounter (INDEPENDENT_AMBULATORY_CARE_PROVIDER_SITE_OTHER): Payer: Self-pay | Admitting: Nurse Practitioner

## 2018-08-30 VITALS — BP 147/88 | HR 70 | Resp 16 | Ht 67.0 in | Wt 142.0 lb

## 2018-08-30 DIAGNOSIS — I714 Abdominal aortic aneurysm, without rupture, unspecified: Secondary | ICD-10-CM

## 2018-08-30 DIAGNOSIS — I10 Essential (primary) hypertension: Secondary | ICD-10-CM

## 2018-08-30 DIAGNOSIS — I739 Peripheral vascular disease, unspecified: Secondary | ICD-10-CM | POA: Insufficient documentation

## 2018-08-30 DIAGNOSIS — Z87891 Personal history of nicotine dependence: Secondary | ICD-10-CM

## 2018-08-30 DIAGNOSIS — E782 Mixed hyperlipidemia: Secondary | ICD-10-CM

## 2018-08-30 NOTE — Progress Notes (Signed)
Subjective:    Patient ID: Heather Mahoney, female    DOB: December 19, 1965, 52 y.o.   MRN: [id number] Chief Complaint  Patient presents with  . Follow-up    [facility] 2week folloow up    HPI  Heather Mahoney is a 52 y.o. female that is following up post AAA repair with right femoral endarterectomy due to an ischemic right lower extremity.  She underwent an endarterectomy of the right common femoral, profunda femoris, and superficial femoral artery on 08/13/2018.  She reports having no pain since her procedure.  She has actually stopped smoking since her procedure.  She endorses a decrease in claudication-like symptoms of her right lower extremity.  She does endorse some numbness of her right thigh and right groin.  Otherwise, she states that she is in her generally normal state of health.  She denies any fever, chills, nausea, vomiting, diarrhea.  She denies any chest pain or shortness of breath.  Past Medical History:  Diagnosis Date  . GERD (gastroesophageal reflux disease)   . Heart murmur   . Hiatal hernia   . Hyperlipidemia   . Hypertension    On Hydrochlorothiazide  . Tobacco abuse    a. Started at age 40, quit during 3 pregnancies. b. 1 PPD for a 40 pack-year hx (2019)     Past Surgical History:  Procedure Laterality Date  . BREAST SURGERY  1986   I&D for 'milk duct'  . EMBOLECTOMY  08/13/2018   Procedure: Right SFA and profunda femoris Fogarty embolectomy 3  Fogarty embolectomy balloon;  Surgeon: Algernon Huxley, MD;  Location: [facility] ORS;  Service: Vascular;;  . ENDARTERECTOMY FEMORAL Right 08/13/2018   Procedure: Right common femoral, profunda femoris, and superficial femoral artery endarterectomies and patch angioplasty ;  Surgeon: Algernon Huxley, MD;  Location: [facility] ORS;  Service: Vascular;  Laterality: Right;  . ENDOVASCULAR REPAIR/STENT GRAFT N/A 08/13/2018   Procedure: ENDOVASCULAR REPAIR/STENT GRAFT;  Surgeon: Katha Cabal, MD;  Location: [facility];  Service:  Cardiovascular;  Laterality: N/A;  . SALPINGOOPHORECTOMY Left 2000    Social History   Socioeconomic History  . Marital status: Married    Spouse name: Not on file  . Number of children: Not on file  . Years of education: Not on file  . Highest education level: Not on file  Occupational History  . Not on file  Social Needs  . Financial resource strain: Not on file  . Food insecurity:    Worry: Not on file    Inability: Not on file  . Transportation needs:    Medical: Not on file    Non-medical: Not on file  Tobacco Use  . Smoking status: Former Smoker    Packs/day: 0.25    Years: 38.00    Pack years: 9.50    Types: Cigarettes  . Smokeless tobacco: Never Used  Substance and Sexual Activity  . Alcohol use: Yes    Comment: occassional  . Drug use: Yes    Types: Marijuana    Comment: smokes Marijuana 'when I cant sleep, maybe once or twice a week'  . Sexual activity: Yes  Lifestyle  . Physical activity:    Days per week: Not on file    Minutes per session: Not on file  . Stress: Not on file  Relationships  . Social connections:    Talks on phone: Not on file    Gets together: Not on file    Attends religious service: Not  on file    Active member of club or organization: Not on file    Attends meetings of clubs or organizations: Not on file    Relationship status: Not on file  . Intimate partner violence:    Fear of current or ex partner: Not on file    Emotionally abused: Not on file    Physically abused: Not on file    Forced sexual activity: Not on file  Other Topics Concern  . Not on file  Social History Narrative  . Not on file    Family History  Problem Relation Age of Onset  . Diabetes Mother   . Hypertension Mother   . Hyperlipidemia Mother   . Heart disease Mother        CABG X 4  . Asthma Mother   . Sarcoidosis Mother        In remission  . Heart attack Mother   . Diabetes Maternal Grandmother   . Heart disease Maternal Grandmother   .  Hyperlipidemia Maternal Grandmother     Allergies  Allergen Reactions  . Strawberry Extract Hives and Swelling  . Strawberry Flavor Rash     Review of Systems   Review of Systems: Negative Unless Checked Constitutional: [] Weight loss  [] Fever  [] Chills Cardiac: [] Chest pain   []  Atrial Fibrillation  [x] Palpitations   [] Shortness of breath when laying flat   [] Shortness of breath with exertion. Vascular:  [x] Pain in legs with walking   [] Pain in legs with standing  [] History of DVT   [] Phlebitis   [] Swelling in legs   [] Varicose veins   [] Non-healing ulcers Pulmonary:   [] Uses home oxygen   [] Productive cough   [] Hemoptysis   [] Wheeze  [] COPD   [] Asthma Neurologic:  [] Dizziness   [] Seizures   [] History of stroke   [] History of TIA  [] Aphasia   [] Vissual changes   [] Weakness or numbness in arm   [] Weakness or numbness in leg Musculoskeletal:   [] Joint swelling   [] Joint pain   [] Low back pain  []  History of Knee Replacement Hematologic:  [] Easy bruising  [] Easy bleeding   [] Hypercoagulable state   [] Anemic Gastrointestinal:  [] Diarrhea   [] Vomiting  [] Gastroesophageal reflux/heartburn   [] Difficulty swallowing. Genitourinary:  [] Chronic kidney disease   [] Difficult urination  [] Anuric   [] Blood in urine Skin:  [] Rashes   [] Ulcers  Psychological:  [x] History of anxiety   []  History of major depression  []  Memory Difficulties     Objective:   Physical Exam  BP (!) 147/88 (BP Location: Right Arm)   Pulse 70   Resp 16   Ht 5\' 7"  (1.702 m)   Wt 142 lb (64.4 kg)   BMI 22.24 kg/m   Gen: WD/WN, NAD Head: [place]/AT, No temporalis wasting.  Ear/Nose/Throat: Hearing grossly intact, nares w/o erythema or drainage Eyes: PER, EOMI, sclera nonicteric.  Neck: Supple, no masses.  No JVD.  Pulmonary:  Good air movement, no use of accessory muscles.  Cardiac: RRR Vascular: Small well healed incision at right groin  Vessel Right Left  Radial Palpable Palpable  Dorsalis Pedis Palpable Trace  Palpable  Posterior Tibial Palpable Not Palpable   Gastrointestinal: soft, non-distended. No guarding/no peritoneal signs.  Musculoskeletal: M/S 5/5 throughout.  No deformity or atrophy.  Neurologic: Pain and light touch intact in extremities.  Symmetrical.  Speech is fluent. Motor exam as listed above. Psychiatric: Judgment intact, Mood & affect appropriate for pt's clinical situation. Dermatologic: No Venous rashes. No Ulcers Noted.  No changes consistent with cellulitis. Lymph : No Cervical lymphadenopathy, no lichenification or skin changes of chronic lymphedema.      Assessment & Plan:   1. Abdominal aortic aneurysm (AAA) without rupture ([place])   I also explained the need for continued surveillance via ultrasound or CT scan is mandatory, as small aneurysms can enlarge over time.  Currently the patient's blood pressure is adequately controlled, and I have reviewed the importance of hypertension and lipid control.  I have stressed the importance of abstinence from tobacco.  The patient is encouraged to exercise a minimum of 30 minutes at least 5 days a week.    The patient understands that immediate medical attention should be sought if new abdominal pain or back pain develops.  Signs and symptoms of peripheral embolization were discussed, such as sudden pain, pallor, loss of pulse or paresthesia, and these things should also be treated with urgency.  Notifying medical personnel of their AAA is also extremely important.  The patient voices their understanding of all topics discussed.    The patient understands that we will need to do a series of follow-ups and that this abdominal aortic aneurysm is of that will have to be followed annually.  The patient will follow-up in 1 to 2 months for ultrasound of the AAA repair. - VAS [place] EVAR DUPLEX; Future  2. PAD (peripheral artery disease) (HCC) We will obtain bilateral ABIs as well as bilateral lower extremity arterial duplexes to establish a  baseline, because she has not had these test previously.  Recommended the patient to continue to walk, take prescribed medicines as well as Colace.  Patient will follow-up in the office following test outlined in 1 to 2 months. - VAS [place] ABI WITH/WO TBI; Future - VAS [place] LOWER EXTREMITY ARTERIAL DUPLEX; Future  3. Essential hypertension, benign Continue antihypertensive medications as already ordered, these medications have been reviewed and there are no changes at this time.   4. Mixed hyperlipidemia Continue statin as ordered and reviewed, no changes at this time     Current Outpatient Medications on File Prior to Visit  Medication Sig Dispense Refill  . albuterol (PROVENTIL HFA;VENTOLIN HFA) 108 (90 Base) MCG/ACT inhaler Inhale 2 puffs into the lungs every 6 (six) hours as needed for wheezing or shortness of breath. 1 Inhaler 2  . amLODipine (NORVASC) 10 MG tablet Take 1 tablet (10 mg total) by mouth daily. 30 tablet 0  . aspirin EC 81 MG EC tablet Take 1 tablet (81 mg total) by mouth daily. 30 tablet 0  . atorvastatin (LIPITOR) 20 MG tablet Take 1 tablet (20 mg total) by mouth daily. 30 tablet 11  . clopidogrel (PLAVIX) 75 MG tablet Take 1 tablet (75 mg total) by mouth daily. 30 tablet 0  . docusate sodium (COLACE) 100 MG capsule Take 1 capsule (100 mg total) by mouth daily. 10 capsule 0  . ibuprofen (ADVIL,MOTRIN) 600 MG tablet Take 1 tablet (600 mg total) by mouth every 8 (eight) hours as needed. 15 tablet 0  . losartan (COZAAR) 100 MG tablet Take 1 tablet (100 mg total) by mouth daily. 30 tablet 11  . metoprolol succinate (TOPROL-XL) 50 MG 24 hr tablet Take 1 tablet (50 mg total) by mouth daily. 30 tablet 11  . nicotine (NICODERM CQ - DOSED IN MG/24 HOURS) 14 mg/24hr patch Place 1 patch (14 mg total) onto the skin daily. 28 patch 0  . oxyCODONE-acetaminophen (PERCOCET/ROXICET) 5-325 MG tablet Take 1-2 tablets by mouth every 4 (  four) hours as needed for moderate pain or severe pain.  (Patient not taking: Reported on 08/30/2018) 30 tablet 0   No current facility-administered medications on file prior to visit.     There are no Patient Instructions on file for this visit. Return in about 4 weeks (around 09/27/2018).   Kris Hartmann, NP  This note was completed with Sales executive.  Any errors are purely unintentional.

## 2018-09-01 DIAGNOSIS — Z0289 Encounter for other administrative examinations: Secondary | ICD-10-CM

## 2018-09-08 ENCOUNTER — Emergency Department
Admission: EM | Admit: 2018-09-08 | Discharge: 2018-09-09 | Disposition: A | Discharge status: Home / Self Care | Payer: Medicaid Other | Attending: Emergency Medicine | Admitting: Emergency Medicine

## 2018-09-08 ENCOUNTER — Other Ambulatory Visit: Payer: Self-pay

## 2018-09-08 DIAGNOSIS — K529 Noninfective gastroenteritis and colitis, unspecified: Secondary | ICD-10-CM

## 2018-09-08 DIAGNOSIS — Z7902 Long term (current) use of antithrombotics/antiplatelets: Secondary | ICD-10-CM | POA: Insufficient documentation

## 2018-09-08 DIAGNOSIS — Z87891 Personal history of nicotine dependence: Secondary | ICD-10-CM | POA: Insufficient documentation

## 2018-09-08 DIAGNOSIS — Z79899 Other long term (current) drug therapy: Secondary | ICD-10-CM | POA: Insufficient documentation

## 2018-09-08 DIAGNOSIS — K625 Hemorrhage of anus and rectum: Secondary | ICD-10-CM

## 2018-09-08 DIAGNOSIS — I10 Essential (primary) hypertension: Secondary | ICD-10-CM | POA: Insufficient documentation

## 2018-09-08 DIAGNOSIS — F1210 Cannabis abuse, uncomplicated: Secondary | ICD-10-CM | POA: Insufficient documentation

## 2018-09-08 DIAGNOSIS — R1110 Vomiting, unspecified: Secondary | ICD-10-CM | POA: Insufficient documentation

## 2018-09-08 DIAGNOSIS — Z7982 Long term (current) use of aspirin: Secondary | ICD-10-CM | POA: Insufficient documentation

## 2018-09-08 LAB — CBC
HCT: 33.8 % — ABNORMAL LOW (ref 36.0–46.0)
HEMOGLOBIN: 10.9 g/dL — AB (ref 12.0–15.0)
MCH: 26.7 pg (ref 26.0–34.0)
MCHC: 32.2 g/dL (ref 30.0–36.0)
MCV: 82.6 fL (ref 80.0–100.0)
Platelets: 517 10*3/uL — ABNORMAL HIGH (ref 150–400)
RBC: 4.09 MIL/uL (ref 3.87–5.11)
RDW: 15.4 % (ref 11.5–15.5)
WBC: 15.4 10*3/uL — AB (ref 4.0–10.5)
nRBC: 0 % (ref 0.0–0.2)

## 2018-09-08 LAB — COMPREHENSIVE METABOLIC PANEL
ALK PHOS: 110 U/L (ref 38–126)
ALT: 16 U/L (ref 0–44)
AST: 20 U/L (ref 15–41)
Albumin: 4.4 g/dL (ref 3.5–5.0)
Anion gap: 11 (ref 5–15)
BUN: 14 mg/dL (ref 6–20)
CO2: 24 mmol/L (ref 22–32)
CREATININE: 0.65 mg/dL (ref 0.44–1.00)
Calcium: 9.4 mg/dL (ref 8.9–10.3)
Chloride: 103 mmol/L (ref 98–111)
Glucose, Bld: 149 mg/dL — ABNORMAL HIGH (ref 70–99)
Potassium: 4 mmol/L (ref 3.5–5.1)
SODIUM: 138 mmol/L (ref 135–145)
Total Bilirubin: 0.5 mg/dL (ref 0.3–1.2)
Total Protein: 8.5 g/dL — ABNORMAL HIGH (ref 6.5–8.1)

## 2018-09-08 MED ORDER — SODIUM CHLORIDE 0.9 % IV BOLUS
1000.0000 mL | Freq: Once | INTRAVENOUS | Status: AC
  Administered 2018-09-08: 1000 mL via INTRAVENOUS

## 2018-09-08 MED ORDER — DICYCLOMINE HCL 10 MG PO CAPS
20.0000 mg | ORAL_CAPSULE | Freq: Once | ORAL | Status: AC
  Administered 2018-09-08: 20 mg via ORAL
  Filled 2018-09-08: qty 2

## 2018-09-08 MED ORDER — ONDANSETRON HCL 4 MG/2ML IJ SOLN
4.0000 mg | Freq: Once | INTRAMUSCULAR | Status: AC
  Administered 2018-09-08: 4 mg via INTRAVENOUS
  Filled 2018-09-08: qty 2

## 2018-09-08 MED ORDER — ONDANSETRON 4 MG PO TBDP: 4.0000 mg | ORAL_TABLET | Freq: Three times a day (TID) | ORAL | 0 refills | Status: DC | PRN

## 2018-09-08 MED ORDER — DICYCLOMINE HCL 20 MG PO TABS: 20.0000 mg | ORAL_TABLET | Freq: Three times a day (TID) | ORAL | 0 refills | Status: DC | PRN

## 2018-09-08 NOTE — ED Notes (Signed)
Report given to Ashley RN

## 2018-09-08 NOTE — ED Notes (Signed)
Assisted pt to restroom. Pt resting in room at this time.

## 2018-09-08 NOTE — Discharge Instructions (Addendum)
Please take your medications as needed, as written.  Please call the number provided for GI medicine tomorrow to arrange a follow-up appointment.  Return to the emergency department for any increase in the amount of bleeding, dizziness/lightheadedness, or any other symptom personally concerning to yourself.

## 2018-09-08 NOTE — ED Triage Notes (Addendum)
Pt arrives to ED via POV from home with c/o bloody stool that occurred 15 mins PTA. Pt states she ate a chicken sandwich earlier today, felt nauseous afterward and vomited 3 times. Pt also states she had an episode of diarrhea with bright red blood. Pt denies previous h/x of GI or rectal bleeding. Pt does report she was recently d/c'd after being hospitalized for a AAA repair.

## 2018-09-08 NOTE — ED Provider Notes (Addendum)
[facility] Emergency Department Provider Note  Time seen: 9:30 PM  I have reviewed the triage vital signs and the nursing notes.   HISTORY  Chief Complaint Rectal Bleeding    HPI Heather Mahoney is a 52 y.o. female with a past medical history of gastric reflux, hypertension, hyperlipidemia, recent AAA repair, presents to the emergency department with blood in her stool.  According to the patient she had a chicken salad around 2 PM today.  At 2:30 PM she developed what she describes as fairly violent diarrhea and vomiting.  States she was vomiting while actively having diarrhea on the toilet.  States she had multiple episodes of diarrhea and vomiting throughout the day.  Last episode of vomiting was approximately 2 hours ago last episode of diarrhea was approximate 15 minutes prior to arrival.  Patient states for the last episode of diarrhea she noted blood in the toilet as well, which is what prompted the patient to come to the emergency department for evaluation.  Patient states when she had blood in the toilet she noted normal-color stool in addition to the blood.  Denies any fever.  States abdominal cramping often resolved after having a bowel movement.  States nausea.   Past Medical History:  Diagnosis Date  . GERD (gastroesophageal reflux disease)   . Heart murmur   . Hiatal hernia   . Hyperlipidemia   . Hypertension    On Hydrochlorothiazide  . Tobacco abuse    a. Started at age 52, quit during 3 pregnancies. b. 1 PPD for a 40 pack-year hx (2019)     Patient Active Problem List   Diagnosis Date Noted  . PAD (peripheral artery disease) ([place]) 08/30/2018  . AAA (abdominal aortic aneurysm) ([place]) 08/10/2018  . Chest pain with moderate risk for cardiac etiology 12/18/2017  . 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 PVC (premature ventricular contraction) 02/12/2016  . Essential hypertension, benign 02/12/2016  . Tobacco use disorder 02/12/2016  . Migraine headache 01/09/2016   . Lymphocytosis 12/21/2015  . Hypertension 12/12/2015  . Hyperlipidemia 12/12/2015  . Vitamin D deficiency 12/12/2015  . Cardiac murmur 12/12/2015  . Hypertensive urgency 11/29/2015  . Abdominal pulsatile mass 11/29/2015    Past Surgical History:  Procedure Laterality Date  . BREAST SURGERY  1986   I&D for 'milk duct'  . EMBOLECTOMY  08/13/2018   Procedure: Right SFA and profunda femoris Fogarty embolectomy 3  Fogarty embolectomy balloon;  Surgeon: Algernon Huxley, MD;  Location: [facility] ORS;  Service: Vascular;;  . ENDARTERECTOMY FEMORAL Right 08/13/2018   Procedure: Right common femoral, profunda femoris, and superficial femoral artery endarterectomies and patch angioplasty ;  Surgeon: Algernon Huxley, MD;  Location: [facility] ORS;  Service: Vascular;  Laterality: Right;  . ENDOVASCULAR REPAIR/STENT GRAFT N/A 08/13/2018   Procedure: ENDOVASCULAR REPAIR/STENT GRAFT;  Surgeon: Katha Cabal, MD;  Location: [place] [facility];  Service: Cardiovascular;  Laterality: N/A;  . SALPINGOOPHORECTOMY Left 2000    Prior to Admission medications   Medication Sig Start Date End Date Taking? Authorizing Provider  albuterol (PROVENTIL HFA;VENTOLIN HFA) 108 (90 Base) MCG/ACT inhaler Inhale 2 puffs into the lungs every 6 (six) hours as needed for wheezing or shortness of breath. 01/11/16   Lavonia Drafts, MD  amLODipine (NORVASC) 10 MG tablet Take 1 tablet (10 mg total) by mouth daily. 08/16/18   Vaughan Basta, MD  aspirin EC 81 MG EC tablet Take 1 tablet (81 mg total) by mouth daily. 08/16/18   Vaughan Basta,  MD  atorvastatin (LIPITOR) 20 MG tablet Take 1 tablet (20 mg total) by mouth daily. 12/18/17   Minna Merritts, MD  clopidogrel (PLAVIX) 75 MG tablet Take 1 tablet (75 mg total) by mouth daily. 08/16/18   Vaughan Basta, MD  docusate sodium (COLACE) 100 MG capsule Take 1 capsule (100 mg total) by mouth daily. 08/16/18   Vaughan Basta, MD  ibuprofen (ADVIL,MOTRIN) 600  MG tablet Take 1 tablet (600 mg total) by mouth every 8 (eight) hours as needed. 01/05/18   Sable Feil, PA-C  losartan (COZAAR) 100 MG tablet Take 1 tablet (100 mg total) by mouth daily. 12/18/17   Minna Merritts, MD  metoprolol succinate (TOPROL-XL) 50 MG 24 hr tablet Take 1 tablet (50 mg total) by mouth daily. 12/18/17   Minna Merritts, MD  nicotine (NICODERM CQ - DOSED IN MG/24 HOURS) 14 mg/24hr patch Place 1 patch (14 mg total) onto the skin daily. 08/16/18   Vaughan Basta, MD  oxyCODONE-acetaminophen (PERCOCET/ROXICET) 5-325 MG tablet Take 1-2 tablets by mouth every 4 (four) hours as needed for moderate pain or severe pain. Patient not taking: Reported on 08/30/2018 08/15/18   Vaughan Basta, MD    Allergies  Allergen Reactions  . Strawberry Extract Hives and Swelling  . Strawberry Flavor Rash    Family History  Problem Relation Age of Onset  . Diabetes Mother   . Hypertension Mother   . Hyperlipidemia Mother   . Heart disease Mother        CABG X 4  . Asthma Mother   . Sarcoidosis Mother        In remission  . Heart attack Mother   . Diabetes Maternal Grandmother   . Heart disease Maternal Grandmother   . Hyperlipidemia Maternal Grandmother     Social History Social History   Tobacco Use  . Smoking status: Former Smoker    Packs/day: 0.25    Years: 38.00    Pack years: 9.50    Types: Cigarettes  . Smokeless tobacco: Never Used  Substance Use Topics  . Alcohol use: Yes    Comment: occassional  . Drug use: Yes    Types: Marijuana    Comment: smokes Marijuana 'when I cant sleep, maybe once or twice a week'    Review of Systems Constitutional: Negative for fever. Cardiovascular: Negative for chest pain. Respiratory: Negative for shortness of breath. Gastrointestinal: Positive for abdominal cramping.  Vomiting and diarrhea.  Positive for blood in her stool. Genitourinary: Negative for urinary compaints Musculoskeletal: Negative for  musculoskeletal complaints Skin: Negative for skin complaints  Neurological: Negative for headache All other ROS negative  ____________________________________________   PHYSICAL EXAM:  VITAL SIGNS: ED Triage Vitals  Enc Vitals Group     BP 09/08/18 2104 (!) 167/150     Pulse Rate 09/08/18 2104 78     Resp 09/08/18 2104 18     Temp 09/08/18 2104 98 F (36.7 C)     Temp Source 09/08/18 2104 Oral     SpO2 09/08/18 2104 100 %     Weight 09/08/18 2105 140 lb (63.5 kg)     Height 09/08/18 2105 5\' 7"  (1.702 m)     Head Circumference --      Peak Flow --      Pain Score 09/08/18 2105 10     Pain Loc --      Pain Edu? --      Excl. in [place]? --     Constitutional:  Alert and oriented. Well appearing and in no distress. Eyes: Normal exam ENT   Head: Normocephalic and atraumatic.   Mouth/Throat: Mucous membranes are moist. Cardiovascular: Normal rate, regular rhythm. No murmur Respiratory: Normal respiratory effort without tachypnea nor retractions. Breath sounds are clear Gastrointestinal: Soft, slight left lower quadrant tenderness described as cramping type pain.  No rebound guarding or distention. Musculoskeletal: Nontender with normal range of motion in all extremities.  Neurologic:  Normal speech and language. No gross focal neurologic deficits Skin:  Skin is warm, dry and intact.  Psychiatric: Mood and affect are normal.   ____________________________________________  INITIAL IMPRESSION / ASSESSMENT AND PLAN / ED COURSE  Pertinent labs & imaging results that were available during my care of the patient were reviewed by me and considered in my medical decision making (see chart for details).  Patient presents to the emergency department for nausea vomiting diarrhea, now with blood in her stool.  Differential would include hemorrhoidal bleeding, lower GI bleed, upper GI bleed, invasive gastroenteritis.  Overall the patient appears extremely well, largely benign abdomen  besides very slight discomfort in the left lower quadrant described by the patient is more of a cramping feeling.  Patient's vitals are reassuring.  Rectal examination shows light brown/yellow stool, mildly guaiac positive.  Patient does have small external hemorrhoids but no active bleeding identified on the hemorrhoid.  Patient did have a recent AAA repair however with normal-appearing stool I do not believe this would be suspicious for aortoenteric fistula.  We will check labs, treat the patient's discomfort with Bentyl and Zofran, IV hydrate while awaiting lab results.  Patient agreeable to plan of care.  Patient's labs have resulted largely at baseline, mild leukocytosis which could go along with gastroenteritis.  Patient's H&H is at her baseline.  Rectal exam was largely non-concerning, mildly guaiac positive we will have the patient follow-up with GI medicine.  I did discuss return precautions for any increase in the amount of blood, lightheadedness or dizziness.  Patient agreeable to plan of care.  ____________________________________________   FINAL CLINICAL IMPRESSION(S) / ED DIAGNOSES  Gastroenteritis Rectal bleeding   Harvest Dark, MD 09/08/18 2232    Harvest Dark, MD 09/08/18 2232

## 2018-09-29 ENCOUNTER — Ambulatory Visit (INDEPENDENT_AMBULATORY_CARE_PROVIDER_SITE_OTHER): Payer: Self-pay | Admitting: Nurse Practitioner

## 2018-09-29 ENCOUNTER — Ambulatory Visit (INDEPENDENT_AMBULATORY_CARE_PROVIDER_SITE_OTHER): Payer: Self-pay

## 2018-09-29 ENCOUNTER — Encounter (INDEPENDENT_AMBULATORY_CARE_PROVIDER_SITE_OTHER): Payer: Self-pay | Admitting: Nurse Practitioner

## 2018-09-29 VITALS — BP 166/100 | HR 50 | Ht 67.0 in | Wt 147.0 lb

## 2018-09-29 DIAGNOSIS — I714 Abdominal aortic aneurysm, without rupture, unspecified: Secondary | ICD-10-CM

## 2018-09-29 DIAGNOSIS — I739 Peripheral vascular disease, unspecified: Secondary | ICD-10-CM

## 2018-09-29 DIAGNOSIS — I10 Essential (primary) hypertension: Secondary | ICD-10-CM

## 2018-09-29 DIAGNOSIS — E782 Mixed hyperlipidemia: Secondary | ICD-10-CM

## 2018-09-29 DIAGNOSIS — Z87891 Personal history of nicotine dependence: Secondary | ICD-10-CM

## 2018-09-29 MED ORDER — CLOPIDOGREL BISULFATE 75 MG PO TABS: 75.0000 mg | ORAL_TABLET | Freq: Every day | ORAL | 12 refills | Status: DC

## 2018-09-29 MED ORDER — AMLODIPINE BESYLATE 10 MG PO TABS: 10.0000 mg | ORAL_TABLET | Freq: Every day | ORAL | 2 refills | Status: DC

## 2018-09-29 NOTE — Progress Notes (Signed)
Subjective:    Patient ID: Heather Mahoney, female    DOB: 1966/04/16, 52 y.o.   MRN: [id number] Chief Complaint  Patient presents with  . AAA    1 month follow up    HPI  Heather Mahoney is a 52 y.o. female The patient returns to the office for surveillance of an abdominal aortic aneurysm status post stent graft placement on 08/13/2018. She also underwent a right common femoral, profunda femoris, and SFA endarterectomy.  Patient denies abdominal pain or back pain, no other abdominal complaints. No groin related complaints. No symptoms consistent with distal embolization. She reports no  claudication with ambulation.  She denies rest pain.    There have been no interval changes in his overall healthcare since his last visit.   Patient denies amaurosis fugax or TIA symptoms. The patient denies angina or shortness of breath.   Duplex US of the aorta and iliac arteries shows a 3.3 AAA sac with no endoleak, It was 4.1 on CT scan on 08/10/2018.   Patient underwent bilateral ABIs with the right lower extremity at 1.01 and the left at 0.97.  The reported waveforms on ultrasound report an biphasic in the right tibials with triphasic waveforms in the left anterior tibial and biphasic in the left posterior tibial. However their the bilateral tibials appear more biphasic in appearance.   Past Medical History:  Diagnosis Date  . GERD (gastroesophageal reflux disease)   . Heart murmur   . Hiatal hernia   . Hyperlipidemia   . Hypertension    On Hydrochlorothiazide  . Tobacco abuse    a. Started at age 59, quit during 3 pregnancies. b. 1 PPD for a 40 pack-year hx (2019)     Past Surgical History:  Procedure Laterality Date  . BREAST SURGERY  1986   I&D for 'milk duct'  . EMBOLECTOMY  08/13/2018   Procedure: Right SFA and profunda femoris Fogarty embolectomy 3  Fogarty embolectomy balloon;  Surgeon: Algernon Huxley, MD;  Location: [facility] ORS;  Service: Vascular;;  . ENDARTERECTOMY FEMORAL Right  08/13/2018   Procedure: Right common femoral, profunda femoris, and superficial femoral artery endarterectomies and patch angioplasty ;  Surgeon: Algernon Huxley, MD;  Location: [facility] OR[facility];  Service: Vascular;  Laterality: Right;  . ENDOVASCULAR REPAIR/STENT GRAFT N/A 08/13/2018   Procedure: ENDOVASCULAR REPAIR/STENT GRAFT;  Surgeon: Katha Cabal, MD;  Location: [place] [facility];  Service: Cardiovascular;  Laterality: N/A;  . SALPINGOOPHORECTOMY Left 2000    Social History   Socioeconomic History  . Marital status: Married    Spouse name: Not on file  . Number of children: Not on file  . Years of education: Not on file  . Highest education level: Not on file  Occupational History  . Not on file  Social Needs  . Financial resource strain: Not on file  . Food insecurity:    Worry: Not on file    Inability: Not on file  . Transportation needs:    Medical: Not on file    Non-medical: Not on file  Tobacco Use  . Smoking status: Former Smoker    Packs/day: 0.25    Years: 38.00    Pack years: 9.50    Types: Cigarettes  . Smokeless tobacco: Never Used  Substance and Sexual Activity  . Alcohol use: Yes    Comment: occassional  . Drug use: Yes    Types: Marijuana    Comment: smokes Marijuana 'when I cant sleep, maybe  once or twice a week'  . Sexual activity: Yes  Lifestyle  . Physical activity:    Days per week: Not on file    Minutes per session: Not on file  . Stress: Not on file  Relationships  . Social connections:    Talks on phone: Not on file    Gets together: Not on file    Attends religious service: Not on file    Active member of club or organization: Not on file    Attends meetings of clubs or organizations: Not on file    Relationship status: Not on file  . Intimate partner violence:    Fear of current or ex partner: Not on file    Emotionally abused: Not on file    Physically abused: Not on file    Forced sexual activity: Not on file  Other Topics  Concern  . Not on file  Social History Narrative  . Not on file    Family History  Problem Relation Age of Onset  . Diabetes Mother   . Hypertension Mother   . Hyperlipidemia Mother   . Heart disease Mother        CABG X 4  . Asthma Mother   . Sarcoidosis Mother        In remission  . Heart attack Mother   . Diabetes Maternal Grandmother   . Heart disease Maternal Grandmother   . Hyperlipidemia Maternal Grandmother     Allergies  Allergen Reactions  . Strawberry Extract Hives and Swelling  . Strawberry Flavor Rash     Review of Systems   Review of Systems: Negative Unless Checked Constitutional: [] Weight loss  [] Fever  [] Chills Cardiac: [] Chest pain   []  Atrial Fibrillation  [] Palpitations   [] Shortness of breath when laying flat   [] Shortness of breath with exertion. Vascular:  [] Pain in legs with walking   [] Pain in legs with standing  [] History of DVT   [] Phlebitis   [] Swelling in legs   [] Varicose veins   [] Non-healing ulcers Pulmonary:   [] Uses home oxygen   [] Productive cough   [] Hemoptysis   [] Wheeze  [] COPD   [] Asthma Neurologic:  [] Dizziness   [] Seizures   [] History of stroke   [] History of TIA  [] Aphasia   [] Vissual changes   [] Weakness or numbness in arm   [] Weakness or numbness in leg Musculoskeletal:   [] Joint swelling   [] Joint pain   [] Low back pain  []  History of Knee Replacement Hematologic:  [] Easy bruising  [] Easy bleeding   [] Hypercoagulable state   [] Anemic Gastrointestinal:  [] Diarrhea   [] Vomiting  [] Gastroesophageal reflux/heartburn   [] Difficulty swallowing. Genitourinary:  [] Chronic kidney disease   [] Difficult urination  [] Anuric   [] Blood in urine Skin:  [] Rashes   [] Ulcers  Psychological:  [] History of anxiety   []  History of major depression  []  Memory Difficulties     Objective:   Physical Exam  BP (!) 166/100 (BP Location: Right Arm)   Pulse (!) 50   Ht 5\' 7"  (1.702 m)   Wt 147 lb (66.7 kg)   BMI 23.02 kg/m   Gen: WD/WN,  NAD Head: [place]/AT, No temporalis wasting.  Ear/Nose/Throat: Hearing grossly intact, nares w/o erythema or drainage Eyes: PER, EOMI, sclera nonicteric.  Neck: Supple, no masses.  No JVD.  Pulmonary:  Good air movement, no use of accessory muscles.  Cardiac: RRR Vascular:  Vessel Right Left  Radial Palpable Palpable  Dorsalis Pedis Palpable Trace Palpable  Posterior Tibial Palpable  Palpable   Gastrointestinal: soft, non-distended. No guarding/no peritoneal signs.  Musculoskeletal: M/S 5/5 throughout.  No deformity or atrophy.  Neurologic: Pain and light touch intact in extremities.  Symmetrical.  Speech is fluent. Motor exam as listed above. Psychiatric: Judgment intact, Mood & affect appropriate for pt's clinical situation. Dermatologic: No Venous rashes. No Ulcers Noted.  No changes consistent with cellulitis. Lymph : No Cervical lymphadenopathy, no lichenification or skin changes of chronic lymphedema.      Assessment & Plan:   1. Abdominal aortic aneurysm (AAA) without rupture (HCC) Recommend: Patient is status post successful endovascular repair of the AAA.   No further intervention is required at this time.   No endoleak is detected and the aneurysm sac is stable.  The patient will continue antiplatelet therapy as prescribed as well as aggressive management of hyperlipidemia. Exercise is again strongly encouraged.   However, endografts require continued surveillance with ultrasound or CT scan. This is mandatory to detect any changes that allow repressurization of the aneurysm sac.  The patient is informed that this would be asymptomatic.  The patient is reminded that lifelong routine surveillance is a necessity with an endograft. Patient will continue to follow-up at 6 month intervals with ultrasound of the aorta. - VAS [place] EVAR DUPLEX; Future  2. PAD (peripheral artery disease) (HCC) Recommend:  The patient is status post right lower extremity endarterectomy. The patient  reports that the claudication symptoms and leg pain is essentially gone.   The patient denies lifestyle limiting changes at this point in time.  No further invasive studies, angiography or surgery at this time The patient should continue walking and begin a more formal exercise program.  The patient should continue antiplatelet therapy and aggressive treatment of the lipid abnormalities  Patient has continued smoking cessation.  The patient should continue wearing graduated compression socks 10-15 mmHg strength to control the mild edema.  Patient should undergo noninvasive studies as ordered. The patient will follow up with me after the studies.   - VAS [place] LOWER EXTREMITY ARTERIAL DUPLEX; Future  3. Essential hypertension, benign Patient had elevated BP readings today and revealed she ran out of her amlodipine.  She was only given an 30 days at hospital discharge.  Gave a 90 day supply and advised that she would need to obtain a PCP for chronic disease management or contact her cardiologist, Dr. Rockey Situ (per the patient)  - Ambulatory referral to [facility]  4. Mixed hyperlipidemia Continue statin as ordered and reviewed, no changes at this time    Current Outpatient Medications on File Prior to Visit  Medication Sig Dispense Refill  . aspirin EC 81 MG EC tablet Take 1 tablet (81 mg total) by mouth daily. 30 tablet 0  . atorvastatin (LIPITOR) 20 MG tablet Take 1 tablet (20 mg total) by mouth daily. 30 tablet 11  . docusate sodium (COLACE) 100 MG capsule Take 1 capsule (100 mg total) by mouth daily. 10 capsule 0  . losartan (COZAAR) 100 MG tablet Take 1 tablet (100 mg total) by mouth daily. 30 tablet 11  . metoprolol succinate (TOPROL-XL) 50 MG 24 hr tablet Take 1 tablet (50 mg total) by mouth daily. 30 tablet 11  . albuterol (PROVENTIL HFA;VENTOLIN HFA) 108 (90 Base) MCG/ACT inhaler Inhale 2 puffs into the lungs every 6 (six) hours as needed for wheezing or shortness of breath.  (Patient not taking: Reported on 09/29/2018) 1 Inhaler 2  . dicyclomine (BENTYL) 20 MG tablet Take 1 tablet (20  mg total) by mouth 3 (three) times daily as needed for spasms. (Patient not taking: Reported on 09/29/2018) 30 tablet 0  . ibuprofen (ADVIL,MOTRIN) 600 MG tablet Take 1 tablet (600 mg total) by mouth every 8 (eight) hours as needed. (Patient not taking: Reported on 09/29/2018) 15 tablet 0  . nicotine (NICODERM CQ - DOSED IN MG/24 HOURS) 14 mg/24hr patch Place 1 patch (14 mg total) onto the skin daily. (Patient not taking: Reported on 09/29/2018) 28 patch 0  . ondansetron (ZOFRAN ODT) 4 MG disintegrating tablet Take 1 tablet (4 mg total) by mouth every 8 (eight) hours as needed for nausea or vomiting. (Patient not taking: Reported on 09/29/2018) 20 tablet 0  . oxyCODONE-acetaminophen (PERCOCET/ROXICET) 5-325 MG tablet Take 1-2 tablets by mouth every 4 (four) hours as needed for moderate pain or severe pain. (Patient not taking: Reported on 08/30/2018) 30 tablet 0   No current facility-administered medications on file prior to visit.     There are no Patient Instructions on file for this visit. Return in about 6 months (around 03/31/2019).   Kris Hartmann, NP  This note was completed with Sales executive.  Any errors are purely unintentional.

## 2018-10-01 ENCOUNTER — Telehealth (INDEPENDENT_AMBULATORY_CARE_PROVIDER_SITE_OTHER): Payer: Self-pay

## 2018-10-01 DIAGNOSIS — Z0289 Encounter for other administrative examinations: Secondary | ICD-10-CM

## 2018-10-01 NOTE — Telephone Encounter (Signed)
Patient was called to let her know her return to work form was completed and at the front desk, as well as that the cost is $25.00.  Patient understood.

## 2018-11-11 ENCOUNTER — Encounter: Payer: Self-pay | Admitting: Emergency Medicine

## 2018-11-11 ENCOUNTER — Emergency Department
Admission: EM | Admit: 2018-11-11 | Discharge: 2018-11-11 | Disposition: A | Discharge status: Home / Self Care | Payer: Self-pay | Attending: Student in an Organized Health Care Education/Training Program | Admitting: Student in an Organized Health Care Education/Training Program

## 2018-11-11 DIAGNOSIS — G5601 Carpal tunnel syndrome, right upper limb: Secondary | ICD-10-CM | POA: Insufficient documentation

## 2018-11-11 DIAGNOSIS — Z87891 Personal history of nicotine dependence: Secondary | ICD-10-CM | POA: Insufficient documentation

## 2018-11-11 DIAGNOSIS — I10 Essential (primary) hypertension: Secondary | ICD-10-CM | POA: Insufficient documentation

## 2018-11-11 DIAGNOSIS — Z7982 Long term (current) use of aspirin: Secondary | ICD-10-CM | POA: Insufficient documentation

## 2018-11-11 DIAGNOSIS — Z79899 Other long term (current) drug therapy: Secondary | ICD-10-CM | POA: Insufficient documentation

## 2018-11-11 DIAGNOSIS — Z7902 Long term (current) use of antithrombotics/antiplatelets: Secondary | ICD-10-CM | POA: Insufficient documentation

## 2018-11-11 MED ORDER — PREDNISONE 10 MG (21) PO TBPK: ORAL_TABLET | ORAL | 0 refills | Status: DC

## 2018-11-11 MED ORDER — OXYCODONE-ACETAMINOPHEN 5-325 MG PO TABS: 1.0000 | ORAL_TABLET | Freq: Four times a day (QID) | ORAL | 0 refills | Status: DC | PRN

## 2018-11-11 NOTE — Discharge Instructions (Signed)
Please follow up with the primary care or orthopedic doctor if not improving over the week.  Return to the ER for symptoms that change or worsen if unable to schedule an appointment.

## 2018-11-11 NOTE — ED Notes (Signed)
See triage note  Presents with pain to right hand and arm  States she was moving things and pain has  increased since..no deformity noted  Area is tender to touch  Good pulses

## 2018-11-11 NOTE — ED Triage Notes (Signed)
Pt reports pain to right arm from elbow down. Pt states has never been able to make a fist with her right hand because her middle finger doesn't bend. Pt denies obvious injuries but states that she has been doing some heavy lifting and repetative motion for a few days. Pt states pain has been getting worse over the last 2 days.

## 2018-11-11 NOTE — ED Provider Notes (Signed)
[facility] Emergency Department Provider Note ____________________________________________  Time seen: Approximately 3:06 PM  I have reviewed the triage vital signs and the nursing notes.   HISTORY  Chief Complaint Arm Injury; Wrist Pain; and Hand Pain    HPI Heather Mahoney is a 53 y.o. female who presents to the emergency department for evaluation and treatment of pain to the right hand and arm. Pain started after carrying moving boxes. Pain waxes and wanes constantly. She feels that it is swollen in her arm and hand. She has a history of trigger finger of the right hand, but no pain in the arm like now. No relief with Icy Hot, Aleve, and ice.  Past Medical History:  Diagnosis Date  . GERD (gastroesophageal reflux disease)   . Heart murmur   . Hiatal hernia   . Hyperlipidemia   . Hypertension    On Hydrochlorothiazide  . Tobacco abuse    a. Started at age 72, quit during 3 pregnancies. b. 1 PPD for a 40 pack-year hx (2019)     Patient Active Problem List   Diagnosis Date Noted  . PAD (peripheral artery disease) ([place]) 08/30/2018  . AAA (abdominal aortic aneurysm) ([place]) 08/10/2018  . Chest pain with moderate risk for cardiac etiology 12/18/2017  . 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 PVC (premature ventricular contraction) 02/12/2016  . Essential hypertension, benign 02/12/2016  . Tobacco use disorder 02/12/2016  . Migraine headache 01/09/2016  . Lymphocytosis 12/21/2015  . Hypertension 12/12/2015  . Hyperlipidemia 12/12/2015  . Vitamin D deficiency 12/12/2015  . Cardiac murmur 12/12/2015  . Hypertensive urgency 11/29/2015  . Abdominal pulsatile mass 11/29/2015    Past Surgical History:  Procedure Laterality Date  . BREAST SURGERY  1986   I&D for 'milk duct'  . EMBOLECTOMY  08/13/2018   Procedure: Right SFA and profunda femoris Fogarty embolectomy 3  Fogarty embolectomy balloon;  Surgeon: Algernon Huxley, MD;  Location: [facility] ORS;  Service: Vascular;;  .  ENDARTERECTOMY FEMORAL Right 08/13/2018   Procedure: Right common femoral, profunda femoris, and superficial femoral artery endarterectomies and patch angioplasty ;  Surgeon: Algernon Huxley, MD;  Location: [facility] ORS;  Service: Vascular;  Laterality: Right;  . ENDOVASCULAR REPAIR/STENT GRAFT N/A 08/13/2018   Procedure: ENDOVASCULAR REPAIR/STENT GRAFT;  Surgeon: Katha Cabal, MD;  Location: [facility];  Service: Cardiovascular;  Laterality: N/A;  . SALPINGOOPHORECTOMY Left 2000    Prior to Admission medications   Medication Sig Start Date End Date Taking? Authorizing Provider  albuterol (PROVENTIL HFA;VENTOLIN HFA) 108 (90 Base) MCG/ACT inhaler Inhale 2 puffs into the lungs every 6 (six) hours as needed for wheezing or shortness of breath. Patient not taking: Reported on 09/29/2018 01/11/16   Lavonia Drafts, MD  amLODipine (NORVASC) 10 MG tablet Take 1 tablet (10 mg total) by mouth daily. 09/29/18   Kris Hartmann, NP  aspirin EC 81 MG EC tablet Take 1 tablet (81 mg total) by mouth daily. 08/16/18   Vaughan Basta, MD  atorvastatin (LIPITOR) 20 MG tablet Take 1 tablet (20 mg total) by mouth daily. 12/18/17   Minna Merritts, MD  clopidogrel (PLAVIX) 75 MG tablet Take 1 tablet (75 mg total) by mouth daily. 09/29/18   Kris Hartmann, NP  dicyclomine (BENTYL) 20 MG tablet Take 1 tablet (20 mg total) by mouth 3 (three) times daily as needed for spasms. Patient not taking: Reported on 09/29/2018 09/08/18 09/08/19  Harvest Dark, MD  docusate sodium (COLACE) 100 MG capsule  Take 1 capsule (100 mg total) by mouth daily. 08/16/18   Vaughan Basta, MD  ibuprofen (ADVIL,MOTRIN) 600 MG tablet Take 1 tablet (600 mg total) by mouth every 8 (eight) hours as needed. Patient not taking: Reported on 09/29/2018 01/05/18   Sable Feil, PA-C  losartan (COZAAR) 100 MG tablet Take 1 tablet (100 mg total) by mouth daily. 12/18/17   Minna Merritts, MD  metoprolol succinate  (TOPROL-XL) 50 MG 24 hr tablet Take 1 tablet (50 mg total) by mouth daily. 12/18/17   Minna Merritts, MD  nicotine (NICODERM CQ - DOSED IN MG/24 HOURS) 14 mg/24hr patch Place 1 patch (14 mg total) onto the skin daily. Patient not taking: Reported on 09/29/2018 08/16/18   Vaughan Basta, MD  ondansetron (ZOFRAN ODT) 4 MG disintegrating tablet Take 1 tablet (4 mg total) by mouth every 8 (eight) hours as needed for nausea or vomiting. Patient not taking: Reported on 09/29/2018 09/08/18   Harvest Dark, MD  oxyCODONE-acetaminophen (PERCOCET/ROXICET) 5-325 MG tablet Take 1 tablet by mouth every 6 (six) hours as needed for moderate pain or severe pain. 11/11/18   Twyla Dais B, FNP  predniSONE (STERAPRED UNI-PAK 21 TAB) 10 MG (21) TBPK tablet Take 6 tablets on the first day and decrease by 1 tablet each day until finished. 11/11/18   Victorino Dike, FNP    Allergies Strawberry extract and Strawberry flavor  Family History  Problem Relation Age of Onset  . Diabetes Mother   . Hypertension Mother   . Hyperlipidemia Mother   . Heart disease Mother        CABG X 4  . Asthma Mother   . Sarcoidosis Mother        In remission  . Heart attack Mother   . Diabetes Maternal Grandmother   . Heart disease Maternal Grandmother   . Hyperlipidemia Maternal Grandmother     Social History Social History   Tobacco Use  . Smoking status: Former Smoker    Packs/day: 0.25    Years: 38.00    Pack years: 9.50    Types: Cigarettes  . Smokeless tobacco: Never Used  Substance Use Topics  . Alcohol use: Yes    Comment: occassional  . Drug use: Yes    Types: Marijuana    Comment: smokes Marijuana 'when I cant sleep, maybe once or twice a week'    Review of Systems Constitutional: Negative for fever. Cardiovascular: Negative for chest pain. Respiratory: Negative for shortness of breath. Musculoskeletal: Positive for right arm/hand pain. Skin: Negative for open wound or lesion.   Neurological: Negative for decrease in sensation  ____________________________________________   PHYSICAL EXAM:  VITAL SIGNS: ED Triage Vitals  Enc Vitals Group     BP 11/11/18 1441 (!) 147/87     Pulse Rate 11/11/18 1441 66     Resp 11/11/18 1441 20     Temp 11/11/18 1441 97.7 F (36.5 C)     Temp Source 11/11/18 1441 Oral     SpO2 11/11/18 1441 100 %     Weight 11/11/18 1431 145 lb (65.8 kg)     Height 11/11/18 1431 5\' 7"  (1.702 m)     Head Circumference --      Peak Flow --      Pain Score 11/11/18 1431 10     Pain Loc --      Pain Edu? --      Excl. in [place]? --     Constitutional: Alert and oriented. Well  appearing and in no acute distress. Eyes: Conjunctivae are clear without discharge or drainage Head: Atraumatic Neck: Supple Respiratory: No cough. Respirations are even and unlabored. Musculoskeletal: Limited flexion of the right elbow, wrist and fingers secondary to pain.  Neurologic: Paresthesia extending from the right elbow down to the hand.  Motor and sensory function is intact. Skin: Psychiatric: Affect and behavior are appropriate.  ____________________________________________   LABS (all labs ordered are listed, but only abnormal results are displayed)  Labs Reviewed - No data to display ____________________________________________  RADIOLOGY  Not indicated ____________________________________________   PROCEDURES  .Splint Application Date/Time: 06/06/2992 10:08 PM Performed by: Victorino Dike, FNP Authorized by: Victorino Dike, FNP   Consent:    Consent obtained:  Verbal   Consent given by:  Patient   Risks discussed:  Pain and numbness Procedure details:    Laterality:  Right   Location:  Arm   Arm:  R lower arm   Supplies:  Prefabricated splint Post-procedure details:    Pain:  Unchanged   Sensation:  Normal   Patient tolerance of procedure:  Tolerated well, no immediate complications Comments:     Cock-up  splint    ____________________________________________   INITIAL IMPRESSION / ASSESSMENT AND PLAN / ED COURSE  Heather Mahoney is a 53 y.o. who presents to the emergency department for treatment and evaluation of nontraumatic right arm pain.  Exam is most consistent with carpal tunnel.  She Mahoney be treated with prednisone, cock-up splint, and Norco.  She was advised to see orthopedics if not improving over the next week or so.  Differential diagnoses also included DVT, however patient has not had any swelling of the arm, recent immobilization, and pain worsens with movement.  She is currently on Plavix and states that she is compliant.  Pain started after lifting heavy boxes and making repetitive motions therefore do not feel that this is related to a DVT however she was advised that if she notices any swelling or focal area of increased skin temperature or pain that she should either return to the emergency department or see her primary care provider.  Patient instructed to follow-up with orthopedics if not improving over the week.  She was also instructed to return to the emergency department for symptoms that change or worsen if unable schedule an appointment with orthopedics or primary care.  Medications - No data to display  Pertinent labs & imaging results that were available during my care of the patient were reviewed by me and considered in my medical decision making (see chart for details).  _________________________________________   FINAL CLINICAL IMPRESSION(S) / ED DIAGNOSES  Final diagnoses:  Carpal tunnel syndrome of right wrist    ED Discharge Orders         Ordered    predniSONE (STERAPRED UNI-PAK 21 TAB) 10 MG (21) TBPK tablet     11/11/18 1550    oxyCODONE-acetaminophen (PERCOCET/ROXICET) 5-325 MG tablet  Every 6 hours PRN     11/11/18 1550           If controlled substance prescribed during this visit, 12 month history viewed on the [place] prior to issuing  an initial prescription for Schedule II or III opiod.    Victorino Dike, FNP 11/11/18 2212    Merlyn Lot, MD 11/11/18 [phone]

## 2018-12-03 NOTE — Progress Notes (Signed)
Cardiology Office Note Date:  12/06/2018  Patient ID:  Heather Mahoney 1966/02/12, MRN [id number] PCP:  Patient, No Pcp Per  Cardiologist:  Dr. Rockey Situ, MD    Chief Complaint: Shortness of breath, fatigue, lower extremity cramping, and elevated blood pressure readings  History of Present Illness: Heather Mahoney is a 53 y.o. female with history of reported syncope, frequent PVCs, AAA s/p endovascular repair along with right femoral endarterectomy due to ischemic right lower extremity in 07/2018 followed by vascular surgery, chest pain, HTN, HLD, and tobacco abuse quitting in 07/2018 who presents for evaluation of multiple complaints.   Patient was previously followed by Dr. Yvone Neu, for possible syncope in 2017, though more recently has established with Dr. Rockey Situ. Most recent echo from 01/2016 showed an EF of 55-60%, no RWMA, normal diastolic function, mild MR, RVSF normal, PASP normal, trivial pericardial effusion was noted. Myoview in 01/2016 showed no significant ischemia, EF 46% likely decreased secondary to GI uptake artifact, rare PVCs, good exercise tolerance with the patient achieving 10.1 METs. This was a low risk scan. Outpatient cardiac monitoring in 01/2016 showed an overall sinus rhythm with an average heart rate of 88 bpm (range 57-136 bpm). 10% of the beats were tachycardic. 4 isolated PACs were noted along with 14,946 isolated PVCs and 53 ventricular couplets, representing 12% burden. No documented Afib.   She was last seen in the office in 12/2017 following an ED visit in 10/2017 for headache and chest pressure. Cardiac enzymes were negative. BP elevated at 187/100. She denied any recent syncope. She was recently admitted in 07/2018 with pelvic pain and BP > 200/110. She was found to have an infrarenal AAA that had grown from 3.3 cm to 4.1 cm in 2 years time. The patient ultimately underwent endovascular repair of the AAA as well a right femoral artery endarterectomy on 08/13/2018. In  follow up with vascular surgery, she was doing well with most recent abdominal ultrasound showing no endoleak with a 3.3 cm AAA (improved from 4.1 cm prior to repair). Most recent ABIs through vascular surgery showed RLE 1.01, LLE 0.97 with resolution of right LE claudication following intervention as above. She was seen in the ED in 08/2018 for nausea, vomiting, and diarrhea with positive guaiac. Labs showed a HGB of 10.9 (baseline 11-13) and a leukocytosis of 15.4. Outpatient GI follow up was advised. She was seen in the ED on 11/11/2018 with right hand and arm pain that started after moving boxes. She was felt to have carpal tunnel syndrome and was treated with prednisone and oxycodone. Outpatient orthopedic follow up was advised.   She comes in today with multiple complaints.   1) Exertional dyspnea: Patient notes exertional dyspnea that dates back to mid 09/2018.  She denies any chest pain.  Shortness of breath improves with rest.  She reports dyspnea when walking and from the parking lot to work or walking across the store at work.  With resting for several minutes shortness of breath resolves.  She has abstained from smoking since 07/2018 as above.  No lower extremity swelling, abdominal distention, orthopnea, PND, or early satiety.  No dizziness, presyncope, or syncope.  Prior ischemic evaluation low risk as outlined above.  Currently symptom-free.  2) Lower extremity cramping: She reports her cramping of the right lower extremity never fully resolved and since she has returned back to work in 09/2018 she has felt a worsening of right lower extremity cramping and now left lower extremity cramping consistent with pain  experienced prior to her above lower extremity intervention in 07/2018.  Resting improved symptoms.  She prefers to follow with cardiology moving forward with regards to her PAD.  Currently asymptomatic.  3) Hypertension: She has a long history of poorly controlled blood pressure with  systolic readings frequently in the 140s to 160s and occasionally greater than 200 mmHg.  She reports compliance with amlodipine 10 mg, losartan 100 mg, and Toprol-XL 50 mg.  She is uncertain if she snores as she lives by herself however she does note significant daytime somnolence and frequent napping.  No prior sleep study.  She has quit tobacco as outlined above.  4) Fatigue: This is a longstanding issue for her.  She reports frequent napping and significant daytime somnolence.  No dizziness, presyncope, or syncope.  No recent bleeding episodes.   Past Medical History:  Diagnosis Date  . GERD (gastroesophageal reflux disease)   . Heart murmur   . Hiatal hernia   . Hyperlipidemia   . Hypertension    On Hydrochlorothiazide  . Tobacco abuse    a. Started at age 60, quit during 3 pregnancies. b. 1 PPD for a 40 pack-year hx (2019)     Past Surgical History:  Procedure Laterality Date  . BREAST SURGERY  1986   I&D for 'milk duct'  . EMBOLECTOMY  08/13/2018   Procedure: Right SFA and profunda femoris Fogarty embolectomy 3  Fogarty embolectomy balloon;  Surgeon: Algernon Huxley, MD;  Location: [facility] ORS;  Service: Vascular;;  . ENDARTERECTOMY FEMORAL Right 08/13/2018   Procedure: Right common femoral, profunda femoris, and superficial femoral artery endarterectomies and patch angioplasty ;  Surgeon: Algernon Huxley, MD;  Location: [facility] ORS;  Service: Vascular;  Laterality: Right;  . ENDOVASCULAR REPAIR/STENT GRAFT N/A 08/13/2018   Procedure: ENDOVASCULAR REPAIR/STENT GRAFT;  Surgeon: Katha Cabal, MD;  Location: [facility];  Service: Cardiovascular;  Laterality: N/A;  . SALPINGOOPHORECTOMY Left 2000    Current Meds  Medication Sig  . amLODipine (NORVASC) 10 MG tablet Take 1 tablet (10 mg total) by mouth daily.  Marland Kitchen aspirin EC 81 MG EC tablet Take 1 tablet (81 mg total) by mouth daily.  Marland Kitchen atorvastatin (LIPITOR) 20 MG tablet Take 1 tablet (20 mg total) by mouth daily.  .  clopidogrel (PLAVIX) 75 MG tablet Take 1 tablet (75 mg total) by mouth daily.  Marland Kitchen docusate sodium (COLACE) 100 MG capsule Take 1 capsule (100 mg total) by mouth daily.  Marland Kitchen ibuprofen (ADVIL,MOTRIN) 600 MG tablet Take 1 tablet (600 mg total) by mouth every 8 (eight) hours as needed.  Marland Kitchen losartan (COZAAR) 100 MG tablet Take 1 tablet (100 mg total) by mouth daily.  . metoprolol succinate (TOPROL-XL) 50 MG 24 hr tablet Take 1 tablet (50 mg total) by mouth daily.    Allergies:   Strawberry extract and Strawberry flavor   Social History:  The patient  reports that she quit smoking about 3 months ago. Her smoking use included cigarettes. She has a 9.50 pack-year smoking history. She has never used smokeless tobacco. She reports current alcohol use. She reports current drug use. Drug: Marijuana.   Family History:  The patient's family history includes Asthma in her mother; Diabetes in her maternal grandmother and mother; Heart attack in her mother; Heart disease in her maternal grandmother and mother; Hyperlipidemia in her maternal grandmother and mother; Hypertension in her mother; Sarcoidosis in her mother.  ROS:   Review of Systems  Constitutional: Positive for malaise/fatigue. Negative  for chills, diaphoresis, fever and weight loss.  HENT: Negative for congestion.   Eyes: Negative for discharge and redness.  Respiratory: Positive for shortness of breath. Negative for cough, hemoptysis, sputum production and wheezing.   Cardiovascular: Positive for claudication. Negative for chest pain, palpitations, orthopnea, leg swelling and PND.  Gastrointestinal: Negative for abdominal pain, blood in stool, heartburn, melena, nausea and vomiting.  Genitourinary: Negative for hematuria.  Musculoskeletal: Negative for falls and myalgias.  Skin: Negative for rash.  Neurological: Positive for weakness. Negative for dizziness, tingling, tremors, sensory change, speech change, focal weakness and loss of consciousness.    Endo/Heme/Allergies: Does not bruise/bleed easily.  Psychiatric/Behavioral: Negative for substance abuse. The patient is not nervous/anxious.   All other systems reviewed and are negative.    PHYSICAL EXAM:  VS:  BP (!) 162/102 (BP Location: Left Arm, Patient Position: Sitting, Cuff Size: Normal)   Pulse 76   Ht 5' 6.5" (1.689 m)   Wt 157 lb 4 oz (71.3 kg)   BMI 25.00 kg/m  BMI: Body mass index is 25 kg/m.  Physical Exam  Constitutional: She is oriented to person, place, and time. She appears well-developed and well-nourished.  HENT:  Head: Normocephalic and atraumatic.  Eyes: Right eye exhibits no discharge. Left eye exhibits no discharge.  Neck: Normal range of motion. No JVD present.  Cardiovascular: Normal rate, regular rhythm, S1 normal, S2 normal and normal heart sounds. Exam reveals no distant heart sounds, no friction rub, no midsystolic click and no opening snap.  No murmur heard. Pulses:      Posterior tibial pulses are 1+ on the right side and 1+ on the left side.  Pulmonary/Chest: Effort normal and breath sounds normal. No respiratory distress. She has no decreased breath sounds. She has no wheezes. She has no rales. She exhibits no tenderness.  Abdominal: Soft. She exhibits no distension. There is no abdominal tenderness.  Musculoskeletal:        General: No edema.  Neurological: She is alert and oriented to person, place, and time.  Skin: Skin is warm and dry. No cyanosis. Nails show no clubbing.  Psychiatric: She has a normal mood and affect. Her speech is normal and behavior is normal. Judgment and thought content normal.     EKG:  Was ordered and interpreted by me today. Shows NSR, 76 bpm, rare isolated PVC, no acute st/t changes   Recent Labs: 08/15/2018: Magnesium 2.0 09/08/2018: ALT 16; BUN 14; Creatinine, Ser 0.65; Hemoglobin 10.9; Platelets 517; Potassium 4.0; Sodium 138  No results found for requested labs within last 8760 hours.   CrCl cannot be  calculated (Patient's most recent lab result is older than the maximum 21 days allowed.).   Wt Readings from Last 3 Encounters:  12/06/18 157 lb 4 oz (71.3 kg)  11/11/18 145 lb (65.8 kg)  09/29/18 147 lb (66.7 kg)     Other studies reviewed: Additional studies/records reviewed today include: summarized above  ASSESSMENT AND PLAN:  1. Exertional dyspnea: Currently symptom-free.  Schedule cardiac CT and echo.  Check CBC.  If the initial work-up is unrevealing, consider repeat outpatient cardiac monitoring given her previously noted PVC burden.  2. PAD with claudication-like symptoms: She is s/p intervention of the right femoral artery as above and has been followed by vascular surgery.  Recent ABIs as above.  She prefers to follow-up with cardiology moving forward.  In this setting, we will repeat lower extremity arterial ultrasound/ABI.  Recommend aggressive risk factor modification including optimization of  lipid control as outlined below.  Kudos for tobacco cessation.  Continue aspirin and Plavix as recommended by vascular surgery.  3. HTN: BP continues to be suboptimally controlled.  She reports compliance with antihypertensive therapy.  Refer to pulmonology for consideration of sleep study given her significant daytime somnolence, easy fatigability, poorly controlled blood pressure, and possible sleep disordered breathing.  Schedule renal artery ultrasound.  If she is found to have renal artery stenosis, would recommend addition of thiazide diuretic.  Increase Toprol-XL to 75 mg daily.  She will otherwise continue amlodipine and losartan.  4. HLD: Most recent LDL of 158 from 2017.  Check a CMP, lipid panel, and direct LDL.  Refill Lipitor 20 mg daily.  Escalate antilipid therapy as indicated.  5. Fatigue/daytime somnolence: Refer to pulmonology for consideration of sleep study as above.  Check CBC and TSH.  6. Frequent PVCs: Previously noted on outpatient cardiac monitoring as outlined  above.  Twelve-lead EKG today shows an isolated PVC.  Increase Toprol-XL as outlined above.  Check CMP, CBC, and TSH.  If her above work-up is unrevealing, would recommend repeating outpatient cardiac monitoring to evaluate for PVC burden.  Disposition: F/u with Dr. Rockey Situ or an APP in 6 to 8 weeks.  Current medicines are reviewed at length with the patient today.  The patient did not have any concerns regarding medicines.  Signed, Christell Faith, PA-C 12/06/2018 9:46 AM     [address], [place] [address] [phone]

## 2018-12-06 ENCOUNTER — Encounter: Payer: Self-pay | Admitting: Physician Assistant

## 2018-12-06 ENCOUNTER — Ambulatory Visit (INDEPENDENT_AMBULATORY_CARE_PROVIDER_SITE_OTHER): Payer: Self-pay | Admitting: Physician Assistant

## 2018-12-06 VITALS — BP 162/102 | HR 76 | Ht 66.5 in | Wt 157.2 lb

## 2018-12-06 DIAGNOSIS — R5383 Other fatigue: Secondary | ICD-10-CM

## 2018-12-06 DIAGNOSIS — E785 Hyperlipidemia, unspecified: Secondary | ICD-10-CM

## 2018-12-06 DIAGNOSIS — R0609 Other forms of dyspnea: Secondary | ICD-10-CM

## 2018-12-06 DIAGNOSIS — I10 Essential (primary) hypertension: Secondary | ICD-10-CM

## 2018-12-06 DIAGNOSIS — I739 Peripheral vascular disease, unspecified: Secondary | ICD-10-CM

## 2018-12-06 DIAGNOSIS — I493 Ventricular premature depolarization: Secondary | ICD-10-CM

## 2018-12-06 DIAGNOSIS — R0600 Dyspnea, unspecified: Secondary | ICD-10-CM

## 2018-12-06 DIAGNOSIS — F17200 Nicotine dependence, unspecified, uncomplicated: Secondary | ICD-10-CM

## 2018-12-06 DIAGNOSIS — R400 Somnolence: Secondary | ICD-10-CM

## 2018-12-06 MED ORDER — AMLODIPINE BESYLATE 10 MG PO TABS: 10.0000 mg | ORAL_TABLET | Freq: Every day | ORAL | 3 refills | Status: DC

## 2018-12-06 MED ORDER — CLOPIDOGREL BISULFATE 75 MG PO TABS: 75.0000 mg | ORAL_TABLET | Freq: Every day | ORAL | 12 refills | Status: DC

## 2018-12-06 MED ORDER — METOPROLOL SUCCINATE ER 25 MG PO TB24: 75.0000 mg | ORAL_TABLET | Freq: Every day | ORAL | 3 refills | Status: DC

## 2018-12-06 NOTE — Patient Instructions (Signed)
Medication Instructions:  Your physician has recommended you make the following change in your medication:  1- INCREASE Metoprolol to 3 tablets (75 mg total) once daily  If you need a refill on your cardiac medications before your next appointment, please call your pharmacy.   Lab work: Your physician recommends that you return for lab work today ( direct LDL, lipid, CMET, CBC, TSH)  If you have labs (blood work) drawn today and your tests are completely normal, you will receive your results only by: Marland Kitchen MyChart Message (if you have MyChart) OR . A paper copy in the mail If you have any lab test that is abnormal or we need to change your treatment, we will call you to review the results.  Testing/Procedures: 1- Coronary CT Please arrive at the [facility] main entrance of [facility] at xx:xx AM (30-45 minutes prior to test start time)  [facility] [place], [place] [address] [phone]  Proceed to the [facility] Radiology Department (First Floor).  Please follow these instructions carefully (unless otherwise directed):  Hold all erectile dysfunction medications at least 48 hours prior to test.  On the Night Before the Test: . Be sure to Drink plenty of water. . Do not consume any caffeinated/decaffeinated beverages or chocolate 12 hours prior to your test. . Do not take any antihistamines 12 hours prior to your test. . If you take Metformin do not take 24 hours prior to test. On the Day of the Test: . Drink plenty of water. Do not drink any water within one hour of the test. . Do not eat any food 4 hours prior to the test. . You may take your regular medications prior to the test.  . Take metoprolol (Lopressor) two hours prior to test. . HOLD Furosemide/Hydrochlorothiazide morning of the test.        After the Test: . Drink plenty of water. . After receiving IV contrast, you may experience a mild flushed feeling. This is normal. . On  occasion, you may experience a mild rash up to 24 hours after the test. This is not dangerous. If this occurs, you can take Benadryl 25 mg and increase your fluid intake. . If you experience trouble breathing, this can be serious. If it is severe call 911 IMMEDIATELY. If it is mild, please call our office. . If you take any of these medications: Glipizide/Metformin, Avandament, Glucavance, please do not take 48 hours after completing test. 2- Lower extremity ultrasound 3- Echo 4- Renal ultrasound 5- Referral to Pulmonology for [facility] at [address], [place] [address]   Main: [phone]    Follow-Up: At [facility], you and your health needs are our priority.  As part of our continuing mission to provide you with exceptional heart care, we have created designated Provider Care Teams.  These Care Teams include your primary Cardiologist (physician) and Advanced Practice Providers (APPs -  Physician Assistants and Nurse Practitioners) who all work together to provide you with the care you need, when you need it. You will need a follow up appointment in 6-8 weeks. You may see Ida Rogue, MD Terrence Dupont, PA-C

## 2018-12-07 ENCOUNTER — Telehealth: Payer: Self-pay

## 2018-12-07 DIAGNOSIS — E785 Hyperlipidemia, unspecified: Secondary | ICD-10-CM

## 2018-12-07 DIAGNOSIS — I739 Peripheral vascular disease, unspecified: Secondary | ICD-10-CM

## 2018-12-07 LAB — CBC
HEMOGLOBIN: 12 g/dL (ref 11.1–15.9)
Hematocrit: 36 % (ref 34.0–46.6)
MCH: 27.3 pg (ref 26.6–33.0)
MCHC: 33.3 g/dL (ref 31.5–35.7)
MCV: 82 fL (ref 79–97)
Platelets: 538 10*3/uL — ABNORMAL HIGH (ref 150–450)
RBC: 4.4 x10E6/uL (ref 3.77–5.28)
RDW: 15.7 % — ABNORMAL HIGH (ref 11.7–15.4)
WBC: 9.3 10*3/uL (ref 3.4–10.8)

## 2018-12-07 LAB — LIPID PANEL
CHOL/HDL RATIO: 4.2 ratio (ref 0.0–4.4)
Cholesterol, Total: 266 mg/dL — ABNORMAL HIGH (ref 100–199)
HDL: 64 mg/dL (ref >39)
LDL Calculated: 178 mg/dL — ABNORMAL HIGH (ref 0–99)
TRIGLYCERIDES: 119 mg/dL (ref 0–149)
VLDL Cholesterol Cal: 24 mg/dL (ref 5–40)

## 2018-12-07 LAB — BASIC METABOLIC PANEL
BUN/Creatinine Ratio: 14 (ref 9–23)
BUN: 9 mg/dL (ref 6–24)
CO2: 22 mmol/L (ref 20–29)
Calcium: 9.9 mg/dL (ref 8.7–10.2)
Chloride: 101 mmol/L (ref 96–106)
Creatinine, Ser: 0.66 mg/dL (ref 0.57–1.00)
GFR, EST AFRICAN AMERICAN: 117 mL/min/{1.73_m2} (ref >59)
GFR, EST NON AFRICAN AMERICAN: 101 mL/min/{1.73_m2} (ref >59)
Glucose: 89 mg/dL (ref 65–99)
POTASSIUM: 4.3 mmol/L (ref 3.5–5.2)
SODIUM: 141 mmol/L (ref 134–144)

## 2018-12-07 LAB — TSH: TSH: 3.01 u[IU]/mL (ref 0.450–4.500)

## 2018-12-07 LAB — LDL CHOLESTEROL, DIRECT: LDL DIRECT: 185 mg/dL — AB (ref 0–99)

## 2018-12-07 MED ORDER — ATORVASTATIN CALCIUM 80 MG PO TABS: 80.0000 mg | ORAL_TABLET | Freq: Every day | ORAL | 3 refills | Status: DC

## 2018-12-07 NOTE — Addendum Note (Signed)
Addended by: Anselm Pancoast on: 12/07/2018 08:46 AM   Modules accepted: Orders

## 2018-12-07 NOTE — Telephone Encounter (Signed)
-----   Message from Rise Mu, PA-C sent at 12/07/2018  7:00 AM EST ----- LDL is elevated at 185. HGB is normal. PLT count is elevated, possibly in the setting of dehydration, though renal function does not indicate this. Random glucose is normal. Renal function is normal. Potassium is at goal. Thyroid function is normal.  -Follow up with PCP regarding elevated PLT count.  -Increase Lipitor to 80 mg daily with recheck fasting lipid and LFT in 8 weeks.

## 2018-12-07 NOTE — Telephone Encounter (Signed)
Called patient to discuss lab results and recommendations from provider. She verbalized understanding and will [facility] for fasting labs in early [name].  Orders placed.  Advised pt to call for any further questions or concerns.

## 2018-12-07 NOTE — Telephone Encounter (Signed)
Patient calling to discuss recent testing results  ° °Please call  ° °

## 2018-12-07 NOTE — Telephone Encounter (Signed)
Attempted to call patient. LMTCB 12/07/2018

## 2019-01-03 ENCOUNTER — Telehealth (HOSPITAL_COMMUNITY): Payer: Self-pay | Admitting: Emergency Medicine

## 2019-01-03 NOTE — Telephone Encounter (Signed)
Reaching out to patient to offer assistance regarding upcoming cardiac imaging study; pt verbalizes understanding of appt date/time, parking situation and where to check in, pre-test NPO status and medications ordered, and verified current allergies; name and call back number provided for further questions should they arise Mya Suell RN Navigator Cardiac Imaging [facility] [phone] office [phone] cell 

## 2019-01-04 ENCOUNTER — Ambulatory Visit (HOSPITAL_COMMUNITY)
Admission: RE | Admit: 2019-01-04 | Discharge: 2019-01-04 | Disposition: A | Discharge status: Home / Self Care | Payer: Self-pay | Source: Ambulatory Visit | Attending: Physician Assistant | Admitting: Physician Assistant

## 2019-01-04 ENCOUNTER — Other Ambulatory Visit: Payer: Self-pay | Admitting: Physician Assistant

## 2019-01-04 ENCOUNTER — Other Ambulatory Visit: Payer: Self-pay

## 2019-01-04 DIAGNOSIS — R0609 Other forms of dyspnea: Secondary | ICD-10-CM

## 2019-01-04 DIAGNOSIS — R5383 Other fatigue: Secondary | ICD-10-CM | POA: Insufficient documentation

## 2019-01-04 DIAGNOSIS — I739 Peripheral vascular disease, unspecified: Secondary | ICD-10-CM

## 2019-01-04 DIAGNOSIS — I2510 Atherosclerotic heart disease of native coronary artery without angina pectoris: Secondary | ICD-10-CM

## 2019-01-04 MED ORDER — METOPROLOL TARTRATE 5 MG/5ML IV SOLN
INTRAVENOUS | Status: AC
  Filled 2019-01-04: qty 10

## 2019-01-04 MED ORDER — IOHEXOL 350 MG/ML SOLN
90.0000 mL | Freq: Once | INTRAVENOUS | Status: AC | PRN
  Administered 2019-01-04: 90 mL via INTRAVENOUS

## 2019-01-04 MED ORDER — METOPROLOL TARTRATE 5 MG/5ML IV SOLN: 5.0000 mg | INTRAVENOUS | Status: DC | PRN

## 2019-01-04 MED ORDER — NITROGLYCERIN 0.4 MG SL SUBL
0.8000 mg | SUBLINGUAL_TABLET | Freq: Once | SUBLINGUAL | Status: AC
  Administered 2019-01-04: 0.8 mg via SUBLINGUAL

## 2019-01-04 MED ORDER — NITROGLYCERIN 0.4 MG SL SUBL
SUBLINGUAL_TABLET | SUBLINGUAL | Status: AC
  Filled 2019-01-04: qty 2

## 2019-01-04 NOTE — Progress Notes (Signed)
CT scan completed. Tolerated well. D/C home with daughter, awake and alert. In no distress.

## 2019-01-06 ENCOUNTER — Telehealth: Payer: Self-pay | Admitting: *Deleted

## 2019-01-06 ENCOUNTER — Other Ambulatory Visit: Payer: Self-pay | Admitting: Physician Assistant

## 2019-01-06 DIAGNOSIS — I493 Ventricular premature depolarization: Secondary | ICD-10-CM

## 2019-01-06 DIAGNOSIS — R0600 Dyspnea, unspecified: Secondary | ICD-10-CM

## 2019-01-06 DIAGNOSIS — R0609 Other forms of dyspnea: Secondary | ICD-10-CM

## 2019-01-06 NOTE — Progress Notes (Signed)
Cardiology Office Note  Date:  01/07/2019   ID:  Heather Kynnedi, Mahoney Feb 12, 1966, MRN [id number]  PCP:  Patient, No Pcp Per   Chief Complaint  Patient presents with  . other    f/u CT,6-8 week f/u. SOB with exertion.Medications reviewed verbally with patient.       History of Present Illness: Heather Mahoney is a 53 y.o. female  Smoker GERD HTN PVCs Fall and question of loss of consciousness 2017, secondary to orthostasis,  Who presents today for follow-up of her chest pain/angina, shortness of breath, CAD, results of her recent cardiac CT  INTERVAL HISTORY: The patient reports today for follow up.  She continues to work at [employer] but reports shortness of breath on exertion, has to stop to catch her breath, feels a tightness in her chest Recently seen by our office and cardiac CT scan was ordered showing severe stenosis mid RCA (FFR less than 0.7), indeterminate stenosis LAD and circumflex (FFR less than 0.8)  she reports that she is feeling symptomatic Would prefer to have cardiac catheterization in the near future She is worried about getting sick at her workplace.  States she has recently gained weight.  Unclear if she is having fluid retention  The point of entry in her right leg from her peripheral vascular catheterization in October 2019 has been sore  Review of the notes indicates she had exploration, thrombectomy at that site/right groin  She reports strong family history of coronary disease Mother with bypass surgery Her grandmother, mother side, passed away at the age of 48 from a brain aneurysm.   Blood pressure 142/80 Total Chol 266/ LDL 185 CR 0.66 Glucose 89  EKG personally reviewed by myself on todays visit Shows normal sinus rhythm with occasional PVCs. 73 bpm.   OTHER PAST MEDICAL HISTORY REVIEWED BY ME FOR TODAY'S VISIT: Most recent Cardiac CT scan in March 2020 showed hemodynamically significant stenosis in the mid RCA. Indeterminate FFR in the  proximal LAD and proximal L circumflex lesions.  LAD: Proximal FFR = 0.77, Mid FFR = 0.75, Distal FFR = 0.73 LCX: Proximal FFR = 0.79, Distal FFR = 0.68 RCA: Proximal FFR = 0.95, Mid FFR = 0.67, Distal FFR = 0.52  Seen in the emergency room November 05, 2017 Reported to have a headache , blinking lights leading to headaches also chest pressure Blood pressure was markedly elevated 187/100 Potassium 3.3 Cardiac enzymes negative  Echocardiogram 02/06/2016: Left ventricle: The cavity size was normal. Systolic function was  normal. The estimated ejection fraction was in the range of 55%  to 60%. Wall motion was normal; there were no regional wall  motion abnormalities. Left ventricular diastolic function  parameters were normal. - Mitral valve: There was mild regurgitation. - Right ventricle: Systolic function was normal. - Pulmonary arteries: Systolic pressure was within the normal  range. - Pericardium, extracardiac: A trivial pericardial effusion was  identified.  Holter monitor for 02/05/2016: Overall rhythm was sinus rhythm, with average heart rate of 88 BPM. Minimal heart rate of 57 bpm at 5:24 AM 02/06/2016. Maximum heart rate of 136 BPM and 1747 02/05/2016. 10% total number of beats in tachycardia. Longest RR interval was 1.46 seconds.  No high grade supraventricular ectopy: 4 isolated PACs.   Ventricular ectopy: 14,946 isolated PVCs. 53 ventricular couplets. No ventricular runs. 12% of total number of beats were ventricular beats.  No documented atrial fibrillation.     Exercise nuclear stress test 01/23/2016:  Blood pressure demonstrated a hypertensive response  to exercise.  There was no ST segment deviation noted during stress.  No T wave inversion was noted during stress.  Exercise myocardial perfusion imaging study with no significant ischemia Normal wall motion, EF estimated at 46% Depressed EF likely secondary to GI uptake artifact  No EKG changes  concerning for ischemia at peak stress or in recovery. Rare PVCs noted Good exercise tolerance, 10.1 METS achieved Low risk scan   family history includes Asthma in her mother; Diabetes in her maternal grandmother and mother; Heart attack in her mother; Heart disease in her maternal grandmother and mother; Hyperlipidemia in her maternal grandmother and mother; Hypertension in her mother; Sarcoidosis in her mother.    PMH:   has a past medical history of GERD (gastroesophageal reflux disease), Heart murmur, Hiatal hernia, Hyperlipidemia, Hypertension, and Tobacco abuse.  PSH:    Past Surgical History:  Procedure Laterality Date  . BREAST SURGERY  1986   I&D for 'milk duct'  . EMBOLECTOMY  08/13/2018   Procedure: Right SFA and profunda femoris Fogarty embolectomy 3  Fogarty embolectomy balloon;  Surgeon: Algernon Huxley, MD;  Location: [facility] ORS;  Service: Vascular;;  . ENDARTERECTOMY FEMORAL Right 08/13/2018   Procedure: Right common femoral, profunda femoris, and superficial femoral artery endarterectomies and patch angioplasty ;  Surgeon: Algernon Huxley, MD;  Location: [facility] ORS;  Service: Vascular;  Laterality: Right;  . ENDOVASCULAR REPAIR/STENT GRAFT N/A 08/13/2018   Procedure: ENDOVASCULAR REPAIR/STENT GRAFT;  Surgeon: Katha Cabal, MD;  Location: [place] CV LAB;  Service: Cardiovascular;  Laterality: N/A;  . SALPINGOOPHORECTOMY Left 2000    Current Outpatient Medications  Medication Sig Dispense Refill  . amLODipine (NORVASC) 10 MG tablet Take 1 tablet (10 mg total) by mouth daily. 90 tablet 3  . aspirin EC 81 MG EC tablet Take 1 tablet (81 mg total) by mouth daily. 30 tablet 0  . atorvastatin (LIPITOR) 80 MG tablet Take 1 tablet (80 mg total) by mouth daily. 90 tablet 3  . clopidogrel (PLAVIX) 75 MG tablet Take 1 tablet (75 mg total) by mouth daily. 30 tablet 12  . docusate sodium (COLACE) 100 MG capsule Take 1 capsule (100 mg total) by mouth daily. 10 capsule 0  .  ibuprofen (ADVIL,MOTRIN) 600 MG tablet Take 1 tablet (600 mg total) by mouth every 8 (eight) hours as needed. 15 tablet 0  . losartan (COZAAR) 100 MG tablet Take 1 tablet (100 mg total) by mouth daily. 30 tablet 11  . metoprolol succinate (TOPROL-XL) 25 MG 24 hr tablet Take 3 tablets (75 mg total) by mouth daily. 270 tablet 3  . oxyCODONE-acetaminophen (PERCOCET/ROXICET) 5-325 MG tablet Take 1 tablet by mouth every 6 (six) hours as needed for moderate pain or severe pain. 12 tablet 0   No current facility-administered medications for this visit.      Allergies:   Strawberry extract and Strawberry flavor   Social History:  The patient  reports that she quit smoking about 4 months ago. Her smoking use included cigarettes. She has a 9.50 pack-year smoking history. She has never used smokeless tobacco. She reports current alcohol use. She reports current drug use. Drug: Marijuana.   Family History:   family history includes Asthma in her mother; Diabetes in her maternal grandmother and mother; Heart attack in her mother; Heart disease in her maternal grandmother and mother; Hyperlipidemia in her maternal grandmother and mother; Hypertension in her mother; Sarcoidosis in her mother.    Review of Systems: Review of Systems  Constitutional: Negative.   Eyes: Negative.   Respiratory: Positive for shortness of breath (exertion).   Cardiovascular: Positive for chest pain (exertion).  Gastrointestinal: Negative.   Genitourinary: Negative.   Musculoskeletal: Negative.   Neurological: Negative.   Psychiatric/Behavioral: Negative.   All other systems reviewed and are negative.    PHYSICAL EXAM: VS:  BP (!) 142/80 (BP Location: Left Arm, Patient Position: Sitting, Cuff Size: Normal)   Pulse 73   Ht 5\' 6"  (1.676 m)   Wt 164 lb (74.4 kg)   BMI 26.47 kg/m  , BMI Body mass index is 26.47 kg/m.  Constitutional:  oriented to person, place, and time. No distress.  HENT: normal Head: Grossly  normal Eyes:  no discharge. No scleral icterus.  Neck: No JVD, no carotid bruits  Cardiovascular: Regular rate and rhythm, no murmurs appreciated Pulmonary/Chest: Clear to auscultation bilaterally, no wheezes or rales Abdominal: Soft.  no distension.  no tenderness.  Musculoskeletal: Normal range of motion Neurological:  normal muscle tone. Coordination normal. No atrophy Skin: Skin warm and dry Psychiatric: normal affect, pleasant   Recent Labs: 08/15/2018: Magnesium 2.0 09/08/2018: ALT 16 12/06/2018: BUN 9; Creatinine, Ser 0.66; Hemoglobin 12.0; Platelets 538; Potassium 4.3; Sodium 141; TSH 3.010    Lipid Panel Lab Results  Component Value Date   CHOL 266 (H) 12/06/2018   HDL 64 12/06/2018   LDLCALC 178 (H) 12/06/2018   TRIG 119 12/06/2018      Wt Readings from Last 3 Encounters:  01/07/19 164 lb (74.4 kg)  12/06/18 157 lb 4 oz (71.3 kg)  11/11/18 145 lb (65.8 kg)      ASSESSMENT AND PLAN:  Coronary disease with stable angina Cardiac CTA with suggested severe disease proximal to mid RCA, also likely with disease in the LAD and left circumflex system given low FFR Likely contributing to shortness of breath and chest tightness with exertion Various treatment options discussed with her Recommend she start isosorbide 30 mg daily for anginal symptoms Given the severity of her disease on CT scan, would recommend cardiac catheterization I have reviewed the risks, indications, and alternatives to cardiac catheterization, possible angioplasty, and stenting with the patient. Risks include but are not limited to bleeding, infection, vascular injury, stroke, myocardial infection, arrhythmia, kidney injury, radiation-related injury in the case of prolonged fluoroscopy use, emergency cardiac surgery, and death. The patient understands the risks of serious complication is 1-2 in 2836 with diagnostic cardiac cath and 1-2% or less with angioplasty/stenting.  --- Cath will be scheduled  next week She would ideally like to avoid a radial catheterization as she uses her hands at work  PAD Known AAA, endograft placement several months ago Residual discomfort right groin Had thrombectomy at the site by vascular  Syncope, unspecified syncope type Denies any recent near syncope or syncope  Essential hypertension, benign  Mildly elevated on today's visit, we will add isosorbide 30 daily  Mixed hyperlipidemia   Lipitor 80 mg Goal LDL less than 70  Tobacco use disorder We have encouraged her to continue to work on weaning her cigarettes and smoking cessation. She will continue to work on this and does not want any assistance with chantix.   PVC (premature ventricular contraction) denies any significant symptoms of PVCs Continue metoprolol No change to her regiment  Visual field scotoma, unspecified laterality  Previously seen in the emergency room for scotoma-like symptoms Denies any recurrent symptoms  Disposition:   F/U 2 months  Total encounter time more than 45 minutes. Greater than  50% was spent in counseling and coordination of care with the patient.   Orders Placed This Encounter  Procedures  . EKG 12-Lead   I, Jesus Reyes am acting as a scribe for Ida Rogue, M.D., Ph.D.  I, Ida Rogue, M.D. Ph.D., have reviewed the above documentation for accuracy and completeness, and I agree with the above.   Signed, Esmond Plants, M.D., Ph.D. 01/07/2019  [place], [place]

## 2019-01-06 NOTE — Telephone Encounter (Signed)
Per Dr Rockey Situ, patient can have ultrasound studies for tomorrow cancelled and scheduled to see him instead at earliest convenience.  Patient verbalized understanding of this plan of care and the CT cardiac results.  Patient scheduling updated.       COVID-19 Pre-Screening Questions:  . Do you currently have a fever? no (yes = cancel and refer to pcp for e-visit) . Have you recently travelled on a cruise, internationally, or to [place], [place], [place], [place], [place], or [place], [place] [employer]) ? no (yes = cancel, stay home, monitor symptoms, and contact pcp or initiate e-visit if symptoms develop) . Have you been in contact with someone that is currently pending confirmation of Covid19 testing or has been confirmed to have the [place] virus?  no (yes = cancel, stay home, away from tested individual, monitor symptoms, and contact pcp or initiate e-visit if symptoms develop) . Are you currently experiencing fatigue or cough? no (yes = pt should be prepared to have a mask placed at the time of their visit).

## 2019-01-07 ENCOUNTER — Other Ambulatory Visit: Payer: Self-pay

## 2019-01-07 ENCOUNTER — Ambulatory Visit (INDEPENDENT_AMBULATORY_CARE_PROVIDER_SITE_OTHER): Payer: Self-pay | Admitting: Cardiovascular Disease

## 2019-01-07 ENCOUNTER — Encounter: Payer: Self-pay | Admitting: Cardiovascular Disease

## 2019-01-07 VITALS — BP 142/80 | HR 73 | Ht 66.0 in | Wt 164.0 lb

## 2019-01-07 DIAGNOSIS — R079 Chest pain, unspecified: Secondary | ICD-10-CM

## 2019-01-07 DIAGNOSIS — R011 Cardiac murmur, unspecified: Secondary | ICD-10-CM

## 2019-01-07 MED ORDER — ISOSORBIDE MONONITRATE ER 30 MG PO TB24: 30.0000 mg | ORAL_TABLET | Freq: Every day | ORAL | 3 refills | Status: DC

## 2019-01-07 NOTE — Patient Instructions (Addendum)
Medication Instructions:  Your physician has recommended you make the following change in your medication:  1. START Isosorbide mononitrate 30 mg once daily   If you need a refill on your cardiac medications before your next appointment, please call your pharmacy.    Lab work: CBC & BMP done today   If you have labs (blood work) drawn today and your tests are completely normal, you will receive your results only by: Marland Kitchen MyChart Message (if you have MyChart) OR . A paper copy in the mail If you have any lab test that is abnormal or we need to change your treatment, we will call you to review the results.   Testing/Procedures: [facility] Cardiac Cath Instructions   You are scheduled for a Cardiac Cath on: Thursday March 26th  Please arrive at 08:30am on the day of your procedure  Please expect a call from our [place] to pre-register you  Do not eat/drink anything after midnight  Someone will need to drive you home  It is recommended someone be with you for the first 24 hours after your procedure  Wear clothes that are easy to get on/off and wear slip on shoes if possible   Medications bring a current list of all medications with you  _XX__ You may take all of your medications the morning of your procedure with enough water to swallow safely    Day of your procedure: Arrive at the [place] entrance.  Free valet service is available.  After entering the [place] please check-in at the registration desk (1st desk on your right) to receive your armband. After receiving your armband someone will escort you to the cardiac cath/special procedures waiting area.  The usual length of stay after your procedure is about 2 to 3 hours.  This can vary.  If you have any questions, please call our office at [phone], or you may call the cardiac cath lab at [facility] directly at [phone]     Follow-Up: At [facility], you and your health needs are our  priority.  As part of our continuing mission to provide you with exceptional heart care, we have created designated Provider Care Teams.  These Care Teams include your primary Cardiologist (physician) and Advanced Practice Providers (APPs -  Physician Assistants and Nurse Practitioners) who all work together to provide you with the care you need, when you need it.  . You will need a follow up appointment in 2 months .   Please call our office 2 months in advance to schedule this appointment.    . Providers on your designated Care Team:   . Murray Hodgkins, NP . Christell Faith, PA-C . Marrianne Mood, PA-C  Any Other Special Instructions Will Be Listed Below (If Applicable).  For educational health videos Log in to : [email or web address] Or : [email or web address], password : [email or web address]

## 2019-01-08 ENCOUNTER — Encounter: Payer: Self-pay | Admitting: Emergency Medicine

## 2019-01-08 ENCOUNTER — Emergency Department
Admission: EM | Admit: 2019-01-08 | Discharge: 2019-01-08 | Disposition: A | Discharge status: Home / Self Care | Payer: Medicaid Other | Attending: Emergency Medicine | Admitting: Emergency Medicine

## 2019-01-08 ENCOUNTER — Other Ambulatory Visit: Payer: Self-pay

## 2019-01-08 DIAGNOSIS — R51 Headache: Secondary | ICD-10-CM | POA: Insufficient documentation

## 2019-01-08 DIAGNOSIS — R519 Headache, unspecified: Secondary | ICD-10-CM

## 2019-01-08 DIAGNOSIS — Z87891 Personal history of nicotine dependence: Secondary | ICD-10-CM | POA: Insufficient documentation

## 2019-01-08 DIAGNOSIS — I10 Essential (primary) hypertension: Secondary | ICD-10-CM | POA: Insufficient documentation

## 2019-01-08 LAB — CBC WITH DIFFERENTIAL/PLATELET
Abs Immature Granulocytes: 0.03 10*3/uL (ref 0.00–0.07)
BASOS: 1 %
Basophils Absolute: 0.1 10*3/uL (ref 0.0–0.2)
Basophils Absolute: 0.1 10*3/uL (ref 0.0–0.1)
Basophils Relative: 0 %
EOS (ABSOLUTE): 0.3 10*3/uL (ref 0.0–0.4)
EOS: 3 %
Eosinophils Absolute: 0.4 10*3/uL (ref 0.0–0.5)
Eosinophils Relative: 3 %
HCT: 35.6 % — ABNORMAL LOW (ref 36.0–46.0)
HEMATOCRIT: 35.6 % (ref 34.0–46.6)
HEMOGLOBIN: 11.2 g/dL (ref 11.1–15.9)
Hemoglobin: 11.3 g/dL — ABNORMAL LOW (ref 12.0–15.0)
IMMATURE GRANS (ABS): 0 10*3/uL (ref 0.0–0.1)
IMMATURE GRANULOCYTES: 0 %
Immature Granulocytes: 0 %
LYMPHS: 31 %
Lymphocytes Absolute: 3.1 10*3/uL (ref 0.7–3.1)
Lymphocytes Relative: 28 %
Lymphs Abs: 3.1 10*3/uL (ref 0.7–4.0)
MCH: 26.5 pg — ABNORMAL LOW (ref 26.6–33.0)
MCH: 26.4 pg (ref 26.0–34.0)
MCHC: 31.5 g/dL (ref 31.5–35.7)
MCHC: 31.7 g/dL (ref 30.0–36.0)
MCV: 84 fL (ref 79–97)
MCV: 83.2 fL (ref 80.0–100.0)
Monocytes Absolute: 0.8 10*3/uL (ref 0.1–0.9)
Monocytes Absolute: 1 10*3/uL (ref 0.1–1.0)
Monocytes Relative: 9 %
Monocytes: 8 %
NEUTROS PCT: 57 %
Neutro Abs: 6.7 10*3/uL (ref 1.7–7.7)
Neutrophils Absolute: 5.6 10*3/uL (ref 1.4–7.0)
Neutrophils Relative %: 60 %
PLATELETS: 447 10*3/uL — AB (ref 150–400)
Platelets: 476 10*3/uL — ABNORMAL HIGH (ref 150–450)
RBC: 4.22 x10E6/uL (ref 3.77–5.28)
RBC: 4.28 MIL/uL (ref 3.87–5.11)
RDW: 15.5 % — ABNORMAL HIGH (ref 11.7–15.4)
RDW: 15.6 % — ABNORMAL HIGH (ref 11.5–15.5)
WBC: 9.9 10*3/uL (ref 3.4–10.8)
WBC: 11.1 10*3/uL — ABNORMAL HIGH (ref 4.0–10.5)
nRBC: 0 % (ref 0.0–0.2)

## 2019-01-08 LAB — COMPREHENSIVE METABOLIC PANEL
ALT: 16 U/L (ref 0–44)
ANION GAP: 11 (ref 5–15)
AST: 19 U/L (ref 15–41)
Albumin: 4.1 g/dL (ref 3.5–5.0)
Alkaline Phosphatase: 111 U/L (ref 38–126)
BUN: 11 mg/dL (ref 6–20)
CO2: 26 mmol/L (ref 22–32)
Calcium: 9.2 mg/dL (ref 8.9–10.3)
Chloride: 103 mmol/L (ref 98–111)
Creatinine, Ser: 0.66 mg/dL (ref 0.44–1.00)
GFR calc non Af Amer: >60 mL/min (ref >60)
Glucose, Bld: 124 mg/dL — ABNORMAL HIGH (ref 70–99)
Potassium: 3.6 mmol/L (ref 3.5–5.1)
Sodium: 140 mmol/L (ref 135–145)
Total Bilirubin: 0.7 mg/dL (ref 0.3–1.2)
Total Protein: 8 g/dL (ref 6.5–8.1)

## 2019-01-08 LAB — BASIC METABOLIC PANEL
BUN/Creatinine Ratio: 11 (ref 9–23)
BUN: 10 mg/dL (ref 6–24)
CHLORIDE: 101 mmol/L (ref 96–106)
CO2: 25 mmol/L (ref 20–29)
Calcium: 9.5 mg/dL (ref 8.7–10.2)
Creatinine, Ser: 0.9 mg/dL (ref 0.57–1.00)
GFR calc Af Amer: 84 mL/min/{1.73_m2} (ref >59)
GFR calc non Af Amer: 73 mL/min/{1.73_m2} (ref >59)
Glucose: 109 mg/dL — ABNORMAL HIGH (ref 65–99)
Potassium: 4.5 mmol/L (ref 3.5–5.2)
Sodium: 143 mmol/L (ref 134–144)

## 2019-01-08 MED ORDER — METOCLOPRAMIDE HCL 5 MG/ML IJ SOLN
10.0000 mg | Freq: Once | INTRAMUSCULAR | Status: AC
  Administered 2019-01-08: 10 mg via INTRAVENOUS
  Filled 2019-01-08: qty 2

## 2019-01-08 MED ORDER — DIPHENHYDRAMINE HCL 50 MG/ML IJ SOLN
25.0000 mg | Freq: Once | INTRAMUSCULAR | Status: AC
  Administered 2019-01-08: 25 mg via INTRAVENOUS
  Filled 2019-01-08: qty 1

## 2019-01-08 MED ORDER — SODIUM CHLORIDE 0.9 % IV SOLN
Freq: Once | INTRAVENOUS | Status: AC
  Administered 2019-01-08: 15:00:00 via INTRAVENOUS

## 2019-01-08 MED ORDER — KETOROLAC TROMETHAMINE 30 MG/ML IJ SOLN
30.0000 mg | Freq: Once | INTRAMUSCULAR | Status: AC
  Administered 2019-01-08: 30 mg via INTRAVENOUS
  Filled 2019-01-08: qty 1

## 2019-01-08 MED ORDER — BUTALBITAL-APAP-CAFFEINE 50-325-40 MG PO TABS: 1.0000 | ORAL_TABLET | Freq: Four times a day (QID) | ORAL | 0 refills | Status: DC | PRN

## 2019-01-08 NOTE — ED Triage Notes (Signed)
Woke with bilateral headache about 2 am. Denies fall or injury. Denies use of blood thinners.

## 2019-01-08 NOTE — ED Provider Notes (Signed)
[facility] Emergency Department Provider Note       Time seen: ----------------------------------------- 2:38 PM on 01/08/2019 -----------------------------------------   I have reviewed the triage vital signs and the nursing notes.  HISTORY   Chief Complaint Headache    HPI Heather Mahoney is a 53 y.o. female with a history of GERD, hyperlipidemia, hypertension, peripheral vascular disease, abdominal aortic aneurysm who presents to the ED for bilateral headache that began around 2 AM this morning.  Patient states she just started taking isosorbide extended release 30 mg yesterday.  Symptoms started this morning.  She took her dose of the isosorbide last night.  She denies any recent illness or other complaints.  Pain is diffuse.  Past Medical History:  Diagnosis Date  . GERD (gastroesophageal reflux disease)   . Heart murmur   . Hiatal hernia   . Hyperlipidemia   . Hypertension    On Hydrochlorothiazide  . Tobacco abuse    a. Started at age 65, quit during 3 pregnancies. b. 1 PPD for a 40 pack-year hx (2019)     Patient Active Problem List   Diagnosis Date Noted  . PAD (peripheral artery disease) ([place]) 08/30/2018  . AAA (abdominal aortic aneurysm) ([place]) 08/10/2018  . Chest pain with moderate risk for cardiac etiology 12/18/2017  . 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 PVC (premature ventricular contraction) 02/12/2016  . Essential hypertension, benign 02/12/2016  . Tobacco use disorder 02/12/2016  . Migraine headache 01/09/2016  . Lymphocytosis 12/21/2015  . Hypertension 12/12/2015  . Hyperlipidemia 12/12/2015  . Vitamin D deficiency 12/12/2015  . Cardiac murmur 12/12/2015  . Hypertensive urgency 11/29/2015  . Abdominal pulsatile mass 11/29/2015    Past Surgical History:  Procedure Laterality Date  . BREAST SURGERY  1986   I&D for 'milk duct'  . EMBOLECTOMY  08/13/2018   Procedure: Right SFA and profunda femoris Fogarty embolectomy 3  Fogarty  embolectomy balloon;  Surgeon: Algernon Huxley, MD;  Location: [facility] ORS;  Service: Vascular;;  . ENDARTERECTOMY FEMORAL Right 08/13/2018   Procedure: Right common femoral, profunda femoris, and superficial femoral artery endarterectomies and patch angioplasty ;  Surgeon: Algernon Huxley, MD;  Location: [facility] ORS;  Service: Vascular;  Laterality: Right;  . ENDOVASCULAR REPAIR/STENT GRAFT N/A 08/13/2018   Procedure: ENDOVASCULAR REPAIR/STENT GRAFT;  Surgeon: Katha Cabal, MD;  Location: [facility];  Service: Cardiovascular;  Laterality: N/A;  . SALPINGOOPHORECTOMY Left 2000    Allergies Strawberry extract and Strawberry flavor  Social History Social History   Tobacco Use  . Smoking status: Former Smoker    Packs/day: 0.25    Years: 38.00    Pack years: 9.50    Types: Cigarettes    Last attempt to quit: 08/10/2018    Years since quitting: 0.4  . Smokeless tobacco: Never Used  Substance Use Topics  . Alcohol use: Yes    Comment: occassional  . Drug use: Yes    Types: Marijuana    Comment: smokes Marijuana 'when I cant sleep, maybe once or twice a week'   Review of Systems Constitutional: Negative for fever. Cardiovascular: Negative for chest pain. Respiratory: Negative for shortness of breath. Gastrointestinal: Negative for abdominal pain, vomiting and diarrhea. Musculoskeletal: Negative for back pain. Skin: Negative for rash. Neurological: Positive for headache  All systems negative/normal/unremarkable except as stated in the HPI  ____________________________________________   PHYSICAL EXAM:  VITAL SIGNS: ED Triage Vitals  Enc Vitals Group     BP 01/08/19 1432 (!) 163/106  Pulse Rate 01/08/19 1432 75     Resp 01/08/19 1432 20     Temp 01/08/19 1432 98.6 F (37 C)     Temp Source 01/08/19 1432 Oral     SpO2 01/08/19 1432 99 %     Weight 01/08/19 1435 164 lb (74.4 kg)     Height 01/08/19 1435 5\' 6"  (1.676 m)     Head Circumference --      Peak Flow --       Pain Score 01/08/19 1435 9     Pain Loc --      Pain Edu? --      Excl. in [place]? --    Constitutional: Alert and oriented. Well appearing and in no distress. Eyes: Conjunctivae are normal. Normal extraocular movements.  Mild photophobia is noted ENT      Head: Normocephalic and atraumatic.      Nose: No congestion/rhinnorhea.      Mouth/Throat: Mucous membranes are moist.      Neck: No stridor. Cardiovascular: Normal rate, regular rhythm. No murmurs, rubs, or gallops. Respiratory: Normal respiratory effort without tachypnea nor retractions. Breath sounds are clear and equal bilaterally. No wheezes/rales/rhonchi. Gastrointestinal: Soft and nontender. Normal bowel sounds Musculoskeletal: Nontender with normal range of motion in extremities. No lower extremity tenderness nor edema. Neurologic:  Normal speech and language. No gross focal neurologic deficits are appreciated.  Skin:  Skin is warm, dry and intact. No rash noted. Psychiatric: Mood and affect are normal. Speech and behavior are normal.  ____________________________________________  ED COURSE:  As part of my medical decision making, I reviewed the following data within the [place] History obtained from family if available, nursing notes, old chart and ekg, as well as notes from prior ED visits. Patient presented for headache and likely medication side effect, we will assess with labs as indicated at this time.   Procedures ____________________________________________   LABS (pertinent positives/negatives)  Labs Reviewed  CBC WITH DIFFERENTIAL/PLATELET - Abnormal; Notable for the following components:      Result Value   WBC 11.1 (*)    Hemoglobin 11.3 (*)    HCT 35.6 (*)    RDW 15.6 (*)    Platelets 447 (*)    All other components within normal limits  COMPREHENSIVE METABOLIC PANEL - Abnormal; Notable for the following components:   Glucose, Bld 124 (*)    All other components within normal  limits   ____________________________________________   DIFFERENTIAL DIAGNOSIS   Medication side effect, dehydration, electrolyte abnormality, tension headache, migraine  FINAL ASSESSMENT AND PLAN  Headache   Plan: The patient had presented for headache likely secondary to isosorbide. Patient's labs did not reveal any acute process.  Headache is likely coming from long-acting nitroglycerin.  I will advise her to half the dose and if pain persist she will have to discontinue this entirely.  She is cleared for outpatient follow-up.   Laurence Aly, MD    Note: This note was generated in part or whole with voice recognition software. Voice recognition is usually quite accurate but there are transcription errors that can and very often do occur. I apologize for any typographical errors that were not detected and corrected.     Earleen Newport, MD 01/08/19 [phone]

## 2019-01-10 ENCOUNTER — Telehealth: Payer: Self-pay | Admitting: Cardiovascular Disease

## 2019-01-10 NOTE — Telephone Encounter (Signed)
Call received from [facility] in Centralized Scheduling asking if the patient's cath scheduled for 3/26 needed to be done as scheduled or could this be pushed out 2-3 weeks.  Lavonne Chick I will review with Dr. Rockey Situ and call her back to let her know.

## 2019-01-10 NOTE — Telephone Encounter (Signed)
Per Dr. Rockey Situ, due to unstable angina the patient should proceed with her cath as planned on 3/26.  I spoke with Heather Mahoney in scheduling and she is aware of MD recommendations.

## 2019-01-11 ENCOUNTER — Telehealth: Payer: Self-pay | Admitting: Cardiovascular Disease

## 2019-01-11 NOTE — Telephone Encounter (Signed)
Spoke with patient and reviewed that we needed to push up her heart cath scheduled for 3/26 to 08:30 AM with arrival of 07:30 AM at the [facility] entrance. She confirmed date, time, and location with no further questions at this time.

## 2019-01-12 ENCOUNTER — Other Ambulatory Visit: Payer: Self-pay | Admitting: Cardiovascular Disease

## 2019-01-12 DIAGNOSIS — I200 Unstable angina: Secondary | ICD-10-CM

## 2019-01-13 ENCOUNTER — Ambulatory Visit: Payer: Self-pay

## 2019-01-13 ENCOUNTER — Encounter
Admission: RE | Disposition: A | Discharge status: Home / Self Care | Payer: Self-pay | Source: Home / Self Care | Attending: Cardiovascular Disease

## 2019-01-13 ENCOUNTER — Ambulatory Visit
Admission: RE | Admit: 2019-01-13 | Discharge: 2019-01-15 | Disposition: A | Discharge status: Home / Self Care | Payer: Self-pay | Attending: Cardiovascular Disease | Admitting: Cardiovascular Disease

## 2019-01-13 DIAGNOSIS — E782 Mixed hyperlipidemia: Secondary | ICD-10-CM | POA: Diagnosis present

## 2019-01-13 DIAGNOSIS — I2510 Atherosclerotic heart disease of native coronary artery without angina pectoris: Secondary | ICD-10-CM

## 2019-01-13 DIAGNOSIS — E785 Hyperlipidemia, unspecified: Secondary | ICD-10-CM

## 2019-01-13 DIAGNOSIS — Y839 Surgical procedure, unspecified as the cause of abnormal reaction of the patient, or of later complication, without mention of misadventure at the time of the procedure: Secondary | ICD-10-CM | POA: Insufficient documentation

## 2019-01-13 DIAGNOSIS — I97610 Postprocedural hemorrhage and hematoma of a circulatory system organ or structure following a cardiac catheterization: Secondary | ICD-10-CM | POA: Insufficient documentation

## 2019-01-13 DIAGNOSIS — I340 Nonrheumatic mitral (valve) insufficiency: Secondary | ICD-10-CM | POA: Diagnosis present

## 2019-01-13 DIAGNOSIS — F17200 Nicotine dependence, unspecified, uncomplicated: Secondary | ICD-10-CM | POA: Diagnosis present

## 2019-01-13 DIAGNOSIS — K219 Gastro-esophageal reflux disease without esophagitis: Secondary | ICD-10-CM | POA: Insufficient documentation

## 2019-01-13 DIAGNOSIS — Z95820 Peripheral vascular angioplasty status with implants and grafts: Secondary | ICD-10-CM | POA: Insufficient documentation

## 2019-01-13 DIAGNOSIS — I493 Ventricular premature depolarization: Secondary | ICD-10-CM | POA: Diagnosis present

## 2019-01-13 DIAGNOSIS — Z8679 Personal history of other diseases of the circulatory system: Secondary | ICD-10-CM | POA: Insufficient documentation

## 2019-01-13 DIAGNOSIS — I200 Unstable angina: Secondary | ICD-10-CM

## 2019-01-13 DIAGNOSIS — Z8249 Family history of ischemic heart disease and other diseases of the circulatory system: Secondary | ICD-10-CM | POA: Insufficient documentation

## 2019-01-13 DIAGNOSIS — Z7982 Long term (current) use of aspirin: Secondary | ICD-10-CM | POA: Insufficient documentation

## 2019-01-13 DIAGNOSIS — Z79899 Other long term (current) drug therapy: Secondary | ICD-10-CM | POA: Insufficient documentation

## 2019-01-13 DIAGNOSIS — R0602 Shortness of breath: Secondary | ICD-10-CM | POA: Insufficient documentation

## 2019-01-13 DIAGNOSIS — Z7902 Long term (current) use of antithrombotics/antiplatelets: Secondary | ICD-10-CM | POA: Insufficient documentation

## 2019-01-13 DIAGNOSIS — I25110 Atherosclerotic heart disease of native coronary artery with unstable angina pectoris: Secondary | ICD-10-CM | POA: Insufficient documentation

## 2019-01-13 DIAGNOSIS — I10 Essential (primary) hypertension: Secondary | ICD-10-CM | POA: Diagnosis present

## 2019-01-13 HISTORY — DX: Atherosclerotic heart disease of native coronary artery without angina pectoris: I25.10

## 2019-01-13 HISTORY — PX: CORONARY STENT INTERVENTION: CATH118234

## 2019-01-13 HISTORY — PX: LEFT HEART CATH AND CORONARY ANGIOGRAPHY: CATH118249

## 2019-01-13 LAB — CBC WITH DIFFERENTIAL/PLATELET
Abs Immature Granulocytes: 0.05 10*3/uL (ref 0.00–0.07)
Basophils Absolute: 0 10*3/uL (ref 0.0–0.1)
Basophils Relative: 0 %
EOS PCT: 1 %
Eosinophils Absolute: 0.2 10*3/uL (ref 0.0–0.5)
HCT: 29.8 % — ABNORMAL LOW (ref 36.0–46.0)
Hemoglobin: 9.6 g/dL — ABNORMAL LOW (ref 12.0–15.0)
Immature Granulocytes: 0 %
Lymphocytes Relative: 18 %
Lymphs Abs: 2.5 10*3/uL (ref 0.7–4.0)
MCH: 26.7 pg (ref 26.0–34.0)
MCHC: 32.2 g/dL (ref 30.0–36.0)
MCV: 83 fL (ref 80.0–100.0)
Monocytes Absolute: 0.9 10*3/uL (ref 0.1–1.0)
Monocytes Relative: 6 %
Neutro Abs: 10.5 10*3/uL — ABNORMAL HIGH (ref 1.7–7.7)
Neutrophils Relative %: 75 %
Platelets: 377 10*3/uL (ref 150–400)
RBC: 3.59 MIL/uL — ABNORMAL LOW (ref 3.87–5.11)
RDW: 15.7 % — ABNORMAL HIGH (ref 11.5–15.5)
WBC: 14.1 10*3/uL — ABNORMAL HIGH (ref 4.0–10.5)
nRBC: 0 % (ref 0.0–0.2)

## 2019-01-13 LAB — POCT ACTIVATED CLOTTING TIME
ACTIVATED CLOTTING TIME: 296 s
Activated Clotting Time: 274 seconds
Activated Clotting Time: 296 seconds

## 2019-01-13 LAB — MRSA PCR SCREENING: MRSA by PCR: NEGATIVE

## 2019-01-13 LAB — GLUCOSE, CAPILLARY
Glucose-Capillary: 121 mg/dL — ABNORMAL HIGH (ref 70–99)
Glucose-Capillary: 103 mg/dL — ABNORMAL HIGH (ref 70–99)

## 2019-01-13 SURGERY — LEFT HEART CATH AND CORONARY ANGIOGRAPHY
Anesthesia: Moderate Sedation

## 2019-01-13 MED ORDER — FENTANYL CITRATE (PF) 100 MCG/2ML IJ SOLN
INTRAMUSCULAR | Status: DC | PRN
  Administered 2019-01-13 (×2): 25 ug via INTRAVENOUS

## 2019-01-13 MED ORDER — MIDAZOLAM HCL 2 MG/2ML IJ SOLN
INTRAMUSCULAR | Status: AC
  Filled 2019-01-13: qty 2

## 2019-01-13 MED ORDER — CLOPIDOGREL BISULFATE 75 MG PO TABS
ORAL_TABLET | ORAL | Status: AC
  Filled 2019-01-13: qty 4

## 2019-01-13 MED ORDER — HEPARIN SODIUM (PORCINE) 1000 UNIT/ML IJ SOLN
INTRAMUSCULAR | Status: AC
  Filled 2019-01-13: qty 1

## 2019-01-13 MED ORDER — LABETALOL HCL 5 MG/ML IV SOLN: 10.0000 mg | INTRAVENOUS | Status: AC | PRN

## 2019-01-13 MED ORDER — AMLODIPINE BESYLATE 10 MG PO TABS: 10.0000 mg | ORAL_TABLET | Freq: Every day | ORAL | Status: DC

## 2019-01-13 MED ORDER — SODIUM CHLORIDE 0.9 % WEIGHT BASED INFUSION: 1.0000 mL/kg/h | INTRAVENOUS | Status: DC

## 2019-01-13 MED ORDER — MIDAZOLAM HCL 2 MG/2ML IJ SOLN
INTRAMUSCULAR | Status: DC | PRN
  Administered 2019-01-13 (×2): 1 mg via INTRAVENOUS

## 2019-01-13 MED ORDER — SODIUM CHLORIDE 0.9 % WEIGHT BASED INFUSION
3.0000 mL/kg/h | INTRAVENOUS | Status: DC
  Administered 2019-01-13: 3 mL/kg/h via INTRAVENOUS

## 2019-01-13 MED ORDER — SODIUM CHLORIDE 0.9% FLUSH: 3.0000 mL | INTRAVENOUS | Status: DC | PRN

## 2019-01-13 MED ORDER — ONDANSETRON HCL 4 MG/2ML IJ SOLN
4.0000 mg | Freq: Four times a day (QID) | INTRAMUSCULAR | Status: DC | PRN
  Administered 2019-01-14: 4 mg via INTRAVENOUS
  Filled 2019-01-13: qty 2

## 2019-01-13 MED ORDER — FENTANYL CITRATE (PF) 100 MCG/2ML IJ SOLN
INTRAMUSCULAR | Status: AC
  Filled 2019-01-13: qty 2

## 2019-01-13 MED ORDER — DOCUSATE SODIUM 100 MG PO CAPS
100.0000 mg | ORAL_CAPSULE | Freq: Every day | ORAL | Status: DC
  Administered 2019-01-14 – 2019-01-15 (×2): 100 mg via ORAL
  Filled 2019-01-13 (×2): qty 1

## 2019-01-13 MED ORDER — LOSARTAN POTASSIUM 50 MG PO TABS: 100.0000 mg | ORAL_TABLET | Freq: Every day | ORAL | Status: DC

## 2019-01-13 MED ORDER — METOPROLOL SUCCINATE ER 50 MG PO TB24: 75.0000 mg | ORAL_TABLET | Freq: Every day | ORAL | Status: DC

## 2019-01-13 MED ORDER — MIDAZOLAM HCL 2 MG/2ML IJ SOLN
INTRAMUSCULAR | Status: DC | PRN
  Administered 2019-01-13: 0.5 mg via INTRAVENOUS
  Administered 2019-01-13: 1 mg via INTRAVENOUS
  Administered 2019-01-13: 0.5 mg via INTRAVENOUS

## 2019-01-13 MED ORDER — ATORVASTATIN CALCIUM 20 MG PO TABS
80.0000 mg | ORAL_TABLET | Freq: Every day | ORAL | Status: DC
  Administered 2019-01-14 – 2019-01-15 (×2): 80 mg via ORAL
  Filled 2019-01-13 (×2): qty 4

## 2019-01-13 MED ORDER — HYDRALAZINE HCL 20 MG/ML IJ SOLN: 10.0000 mg | INTRAMUSCULAR | Status: AC | PRN

## 2019-01-13 MED ORDER — ASPIRIN EC 81 MG PO TBEC
81.0000 mg | DELAYED_RELEASE_TABLET | Freq: Every day | ORAL | Status: DC
  Administered 2019-01-14 – 2019-01-15 (×2): 81 mg via ORAL
  Filled 2019-01-13 (×2): qty 1

## 2019-01-13 MED ORDER — SODIUM CHLORIDE 0.9 % IV SOLN: 250.0000 mL | INTRAVENOUS | Status: DC | PRN

## 2019-01-13 MED ORDER — HEPARIN SODIUM (PORCINE) 1000 UNIT/ML IJ SOLN
INTRAMUSCULAR | Status: DC | PRN
  Administered 2019-01-13: 2000 [IU] via INTRAVENOUS
  Administered 2019-01-13: 6000 [IU] via INTRAVENOUS

## 2019-01-13 MED ORDER — BUTALBITAL-APAP-CAFFEINE 50-325-40 MG PO TABS: 1.0000 | ORAL_TABLET | Freq: Four times a day (QID) | ORAL | Status: DC | PRN

## 2019-01-13 MED ORDER — IOPAMIDOL (ISOVUE-300) INJECTION 61%
INTRAVENOUS | Status: DC | PRN
  Administered 2019-01-13: 80 mL via INTRA_ARTERIAL
  Administered 2019-01-13: 110 mL via INTRA_ARTERIAL
  Administered 2019-01-13: 80 mL via INTRA_ARTERIAL

## 2019-01-13 MED ORDER — HEPARIN (PORCINE) IN NACL 1000-0.9 UT/500ML-% IV SOLN
INTRAVENOUS | Status: AC
  Filled 2019-01-13: qty 1000

## 2019-01-13 MED ORDER — BIVALIRUDIN TRIFLUOROACETATE 250 MG IV SOLR
INTRAVENOUS | Status: AC
  Filled 2019-01-13: qty 250

## 2019-01-13 MED ORDER — HEPARIN (PORCINE) IN NACL 1000-0.9 UT/500ML-% IV SOLN
INTRAVENOUS | Status: DC | PRN
  Administered 2019-01-13: 1000 mL

## 2019-01-13 MED ORDER — IOHEXOL 300 MG/ML  SOLN
INTRAMUSCULAR | Status: DC | PRN
  Administered 2019-01-13: 10 mL via INTRA_ARTERIAL

## 2019-01-13 MED ORDER — SODIUM CHLORIDE 0.9% FLUSH
3.0000 mL | Freq: Two times a day (BID) | INTRAVENOUS | Status: DC
  Administered 2019-01-13 – 2019-01-15 (×4): 3 mL via INTRAVENOUS

## 2019-01-13 MED ORDER — NITROGLYCERIN 5 MG/ML IV SOLN
INTRAVENOUS | Status: AC
  Filled 2019-01-13: qty 10

## 2019-01-13 MED ORDER — ASPIRIN 81 MG PO CHEW: 81.0000 mg | CHEWABLE_TABLET | ORAL | Status: DC

## 2019-01-13 MED ORDER — CLOPIDOGREL BISULFATE 75 MG PO TABS
75.0000 mg | ORAL_TABLET | Freq: Every day | ORAL | Status: DC
  Administered 2019-01-14 – 2019-01-15 (×2): 75 mg via ORAL
  Filled 2019-01-13 (×2): qty 1

## 2019-01-13 MED ORDER — ACETAMINOPHEN 325 MG PO TABS
650.0000 mg | ORAL_TABLET | ORAL | Status: DC | PRN
  Administered 2019-01-14: 650 mg via ORAL
  Filled 2019-01-13: qty 2

## 2019-01-13 MED ORDER — CLOPIDOGREL BISULFATE 75 MG PO TABS
ORAL_TABLET | ORAL | Status: DC | PRN
  Administered 2019-01-13: 300 mg via ORAL

## 2019-01-13 MED ORDER — OXYCODONE-ACETAMINOPHEN 5-325 MG PO TABS
2.0000 | ORAL_TABLET | Freq: Four times a day (QID) | ORAL | Status: DC | PRN
  Administered 2019-01-13 – 2019-01-14 (×2): 2 via ORAL
  Filled 2019-01-13 (×2): qty 2

## 2019-01-13 MED ORDER — FENTANYL CITRATE (PF) 100 MCG/2ML IJ SOLN
INTRAMUSCULAR | Status: DC | PRN
  Administered 2019-01-13 (×2): 25 ug via INTRAVENOUS
  Administered 2019-01-13: 50 ug via INTRAVENOUS

## 2019-01-13 MED ORDER — SODIUM CHLORIDE 0.9 % IV SOLN
INTRAVENOUS | Status: AC
  Administered 2019-01-13: 14:00:00 via INTRAVENOUS

## 2019-01-13 MED ORDER — NITROGLYCERIN 1 MG/10 ML FOR IR/CATH LAB
INTRA_ARTERIAL | Status: DC | PRN
  Administered 2019-01-13 (×2): 200 ug via INTRACORONARY

## 2019-01-13 SURGICAL SUPPLY — 22 items
BALLN TREK RX 2.5X12 (BALLOONS) ×3
BALLN ~~LOC~~ TREK RX 3.0X15 (BALLOONS) ×3
BALLOON TREK RX 2.5X12 (BALLOONS) ×2 IMPLANT
BALLOON ~~LOC~~ TREK RX 3.0X15 (BALLOONS) ×2 IMPLANT
CANNULA 5F STIFF (CANNULA) ×3 IMPLANT
CATH INFINITI 5FR ANG PIGTAIL (CATHETERS) ×3 IMPLANT
CATH INFINITI 5FR JL4 (CATHETERS) ×3 IMPLANT
CATH INFINITI JR4 5F (CATHETERS) ×3 IMPLANT
CATH VISTA GUIDE 6FR JR4 (CATHETERS) ×3 IMPLANT
DEVICE CLOSURE MYNXGRIP 6/7F (Vascular Products) ×3 IMPLANT
DEVICE INFLAT 30 PLUS (MISCELLANEOUS) ×3 IMPLANT
KIT MANI 3VAL PERCEP (MISCELLANEOUS) ×3 IMPLANT
NEEDLE PERC 18GX7CM (NEEDLE) ×3 IMPLANT
NEEDLE SMART 18G ACCESS (NEEDLE) ×3 IMPLANT
PACK CARDIAC CATH (CUSTOM PROCEDURE TRAY) ×3 IMPLANT
SHEATH AVANTI 5FR X 11CM (SHEATH) ×3 IMPLANT
SHEATH AVANTI 6FR X 11CM (SHEATH) ×3 IMPLANT
STENT RESOLUTE ONYX 2.75X18 (Permanent Stent) ×3 IMPLANT
STENT RESOLUTE ONYX 2.75X22 (Permanent Stent) ×3 IMPLANT
WIRE ASAHI PROWATER 180CM (WIRE) ×3 IMPLANT
WIRE GUIDERIGHT .035X150 (WIRE) ×3 IMPLANT
WIRE RUNTHROUGH .014X180CM (WIRE) ×3 IMPLANT

## 2019-01-13 NOTE — Progress Notes (Signed)
Patient to [facility] for recovery post-Cath. Alert and oriented, reporting no pain. Bilateral groin sites WNL with pedal pulses at pre-procedure baseline. MD and patient able to call and update patient's daughter. Patient's daughter to bring belongings to drop off at hospital later this evening for patient. Patient has voided post procedure in external urinary device, sitting up at this time eating/drinking. Report called to [facility], will transport to inpatient room once patient is done eating.

## 2019-01-13 NOTE — Progress Notes (Signed)
1700 received patient from 255 via [place] after near syncopal episode after up to bathroom post cardiac cath and stent placement. Patient became faint with symtomatic hypotension. Taken to CT via bed before admission to ICU. Left groin and left groin area edemetous and painful. Bilateral pedal and posterior tibial pulses weak.. Patient alert and oriented. Able to answer all questions.

## 2019-01-13 NOTE — Consult Note (Signed)
Name: Heather Mahoney MRN: [id number] DOB: [birth year]     CONSULTATION DATE: 01/13/2019  REFERRING MD :  Rockey Situ  CHIEF COMPLAINT: transferred    HISTORY OF PRESENT ILLNESS:   3/21 +head ache seen in ER  3/26 s/p cardiac cath today-s/p 2 stents in RCA Was seen as outpatient for chest pain   Developed acute ABD with syncope and acute low BP Rapid response called Given atropine/IVF's 2 L NS  CT abd shows external HEMM into soft tissues inguinal Area NOT retroperitoneal  Case discussed with Dr Rockey Situ  Patient feels better now Has some ABD tenderness   Now in ICU PAST MEDICAL HISTORY :   has a past medical history of GERD (gastroesophageal reflux disease), Heart murmur, Hiatal hernia, Hyperlipidemia, Hypertension, and Tobacco abuse.  has a past surgical history that includes Breast surgery (1986); Salpingoophorectomy (Left, 2000); ENDOVASCULAR REPAIR/STENT GRAFT (N/A, 08/13/2018); Endarterectomy femoral (Right, 08/13/2018); and Embolectomy (08/13/2018). Prior to Admission medications   Medication Sig Start Date End Date Taking? Authorizing Provider  amLODipine (NORVASC) 10 MG tablet Take 1 tablet (10 mg total) by mouth daily. 12/06/18  Yes Dunn, Areta Haber, PA-C  aspirin EC 81 MG EC tablet Take 1 tablet (81 mg total) by mouth daily. 08/16/18  Yes Vaughan Basta, MD  atorvastatin (LIPITOR) 80 MG tablet Take 1 tablet (80 mg total) by mouth daily. 12/07/18  Yes Dunn, Ryan M, PA-C  butalbital-acetaminophen-caffeine (FIORICET, ESGIC) [phone] MG tablet Take 1-2 tablets by mouth every 6 (six) hours as needed. 01/08/19 01/08/20 Yes Earleen Newport, MD  clopidogrel (PLAVIX) 75 MG tablet Take 1 tablet (75 mg total) by mouth daily. 12/06/18  Yes Dunn, Areta Haber, PA-C  docusate sodium (COLACE) 100 MG capsule Take 1 capsule (100 mg total) by mouth daily. 08/16/18  Yes Vaughan Basta, MD  isosorbide mononitrate (IMDUR) 30 MG 24 hr tablet Take 1 tablet (30 mg total) by mouth daily.  01/07/19  Yes Minna Merritts, MD  losartan (COZAAR) 100 MG tablet Take 1 tablet (100 mg total) by mouth daily. 12/18/17  Yes Gollan, Kathlene November, MD  metoprolol succinate (TOPROL-XL) 25 MG 24 hr tablet Take 3 tablets (75 mg total) by mouth daily. 12/06/18  Yes Dunn, Areta Haber, PA-C  oxyCODONE-acetaminophen (PERCOCET/ROXICET) 5-325 MG tablet Take 1 tablet by mouth every 6 (six) hours as needed for moderate pain or severe pain. Patient not taking: Reported on 01/07/2019 11/11/18   Sherrie George B, FNP   Allergies  Allergen Reactions   Strawberry Extract Hives and Swelling   Strawberry Flavor Rash    FAMILY HISTORY:  family history includes Asthma in her mother; Diabetes in her maternal grandmother and mother; Heart attack in her mother; Heart disease in her maternal grandmother and mother; Hyperlipidemia in her maternal grandmother and mother; Hypertension in her mother; Sarcoidosis in her mother. SOCIAL HISTORY:  reports that she quit smoking about 5 months ago. Her smoking use included cigarettes. She has a 9.50 pack-year smoking history. She has never used smokeless tobacco. She reports current alcohol use. She reports current drug use. Drug: Marijuana.  REVIEW OF SYSTEMS:   Unable to obtain due to critical illness   VITAL SIGNS: Temp:  [98.2 F (36.8 C)] 98.2 F (36.8 C) (03/26 1551) Pulse Rate:  [46-105] 83 (03/26 1651) Resp:  [12-20] 20 (03/26 1551) BP: (59-156)/(48-100) 113/73 (03/26 1651) SpO2:  [87 %-100 %] 96 % (03/26 1651) Weight:  [74.5 kg] 74.5 kg (03/26 0812)  Physical Examination:   GENERAL:NAD, no fevers,  chills, no weakness no fatigue HEAD: Normocephalic, atraumatic.  EYES: PERLA, EOMI No scleral icterus.  MOUTH: Moist mucosal membrane.  EAR, NOSE, THROAT: Clear without exudates. No external lesions.  NECK: Supple. No thyromegaly.  No JVD.  PULMONARY: CTA B/L no wheezing, rhonchi, crackles CARDIOVASCULAR: S1 and S2. Regular rate and rhythm. No  murmurs GASTROINTESTINAL: Soft, +tender, +distended. Positive bowel sounds.  MUSCULOSKELETAL: No swelling, clubbing, or edema.  NEUROLOGIC: No gross focal neurological deficits. 5/5 strength all extremities SKIN: No ulceration, lesions, rashes, or cyanosis.  PSYCHIATRIC: Insight, judgment intact. -depression -anxiety ALL OTHER ROS ARE NEGATIVE        CULTURE RESULTS   No results found for this or any previous visit (from the past 240 hour(s)).        IMAGING    Ct Abdomen Pelvis Wo Contrast  Result Date: 01/13/2019 CLINICAL DATA:  Bleeding from the groin following catheterization today. Hypotension. EXAM: CT ABDOMEN AND PELVIS WITHOUT CONTRAST TECHNIQUE: Multidetector CT imaging of the abdomen and pelvis was performed following the standard protocol without IV contrast. COMPARISON:  08/10/2018 FINDINGS: Lower chest: Clear lung bases.  Heart normal size. Hepatobiliary: No focal liver abnormality is seen. No gallstones, gallbladder wall thickening, or biliary dilatation. Pancreas: Unremarkable. No pancreatic ductal dilatation or surrounding inflammatory changes. Spleen: Normal in size without focal abnormality. Adrenals/Urinary Tract: No adrenal masses. Kidneys normal in size, orientation and position. No renal masses. Residual contrast from the catheterization is noted in the intrarenal collecting systems and ureters. No hydronephrosis or hydroureter. Dense contrast fills the mildly distended bladder. No bladder abnormality. Stomach/Bowel: Stomach is unremarkable. Small bowel and colon are normal in caliber. No wall thickening or inflammation. There scattered colonic diverticula. No diverticulitis. Normal appendix visualized. Vascular/Lymphatic: Changes from previous endovascular stent graft exclusion of an abdominal aortic aneurysm. There is hemorrhage within the subcutaneous soft tissues of the left inguinal region extending from the femoral vessels. Hemorrhage extends superiorly along the  ileo psoas muscle into the left lower quadrant where lies along the left pararenal fascia and lateral conal fascia. There is also stranding and increased attenuation in the subcutaneous soft tissues of the right inguinal region suggesting a recent endovascular puncture on this side as well. No enlarged lymph nodes. Reproductive: Uterus and bilateral adnexa are unremarkable. Other: No intraperitoneal hemorrhage or ascites. Musculoskeletal: No acute or significant osseous findings. IMPRESSION: 1. There is hemorrhage from the left inguinal artery puncture that lies within the left inguinal soft tissues, extending superiorly, across the left extraperitoneal pelvis to the extraperitoneal left lower abdomen and flank. 2. A small amount of apparent hemorrhage lies in the soft tissues of the right inguinal region, no evidence of right pelvic hemorrhage. 3. No intraperitoneal hemorrhage. 4. No other acute abnormality. Electronically Signed   By: Lajean Manes M.D.   On: 01/13/2019 17:22        ASSESSMENT AND PLAN SYNOPSIS  ACUTE BLEED INTO EXTRAPERITONEAL LEFT INGUINAL AREA EXTERIOR (NOT RETROPERITONEAL) responding well to IVF's  Plan to hold pressure with fem Stop IVF's if needed Check CBC Transfuse as needed Follow up Cardiology recs   Corrin Parker, M.D.  [facility]  Medical Director Candelaria Arenas Director [facility] Cardio-Pulmonary Department

## 2019-01-13 NOTE — Progress Notes (Addendum)
1800 Dr. Rockey Situ in to see patient. PAD placed per Dr. Rockey Situ on left groin.

## 2019-01-13 NOTE — Progress Notes (Signed)
Admitted from cath lab and special procedures.  C/o presure type pain in her left lower and mid abdomin with movement.  Significant pain with any pressure.   Drs Saunders Revel and Rockey Situ were notified.  VS WNL.  Patient alert and oriented.  Directed to observe.  She got up to the bathroom - after a few minutes, told me was going to pass out.  Skin clammy.  HR low 40's.  Unable to obtain a BP.  Rapid response called.  Used WC to get patient back to bed.  BP at that time 71/55., HR 62.  1,000 ml bolus started.  CCU Charge gave IV atropine. HR and BP improved afterward, but still low.  Attempted to run concurrent second bolus, but IV blew and attempts to start a second IV were unsuccessful.  Dr. Rockey Situ ordered a STAT CT of Abd and pelvis.  She will be transferred directly to CCU following the CT scan. He consulted with Dr. Lucky Cowboy.

## 2019-01-13 NOTE — Progress Notes (Signed)
Transfer patient to assigned room, upon arrival pt sat up in bed stating 6/10 "pressure" to left lower abdomen "like my bladder", several inches above dressing. Ambulated to inpatient bed and stated discomfort has improved to 2/10 after belching, but reporting 10/10 when pressure is applied. Dr Rockey Situ returned page and notified of patient report. Patient stating that she will attempt to use the bathroom at this time in hopes that the discomfort is related to need to have bowel movement. Dr Rockey Situ stated ice pack or pain medication ok to administer if needed and MD will follow up with inpatient RN to check on patient status once she has used restroom. Bedside RN Bethel Born present and updated on MD discussion.

## 2019-01-13 NOTE — Progress Notes (Signed)
   01/13/19 1600  Clinical Encounter Type  Visited With Health care provider  Visit Type Initial;Other (Comment)  Referral From Nurse  Consult/Referral To Chaplain     01/13/19 1600  Clinical Encounter Type  Visited With Health care provider  Visit Type Initial;Other (Comment)  Referral From Nurse  Consult/Referral To Chaplain  Chaplain received    01/13/19 1600  Clinical Encounter Type  Visited With Health care provider  Visit Type Initial;Other (Comment)  Referral From Nurse  Consult/Referral To Chaplain  Chaplain received page for RR. Patient pass out while using restroom. Team was working with patient. Chaplain made pastoral presences known. AC told Chaplain they were fine. Chaplain left with silent prayer.

## 2019-01-14 ENCOUNTER — Encounter: Payer: Self-pay | Admitting: Cardiovascular Disease

## 2019-01-14 DIAGNOSIS — R55 Syncope and collapse: Secondary | ICD-10-CM

## 2019-01-14 DIAGNOSIS — D62 Acute posthemorrhagic anemia: Secondary | ICD-10-CM

## 2019-01-14 DIAGNOSIS — F17200 Nicotine dependence, unspecified, uncomplicated: Secondary | ICD-10-CM

## 2019-01-14 DIAGNOSIS — E782 Mixed hyperlipidemia: Secondary | ICD-10-CM

## 2019-01-14 DIAGNOSIS — I2510 Atherosclerotic heart disease of native coronary artery without angina pectoris: Secondary | ICD-10-CM

## 2019-01-14 LAB — CBC
HEMATOCRIT: 26.5 % — AB (ref 36.0–46.0)
Hemoglobin: 8.5 g/dL — ABNORMAL LOW (ref 12.0–15.0)
MCH: 26.7 pg (ref 26.0–34.0)
MCHC: 32.1 g/dL (ref 30.0–36.0)
MCV: 83.3 fL (ref 80.0–100.0)
Platelets: 347 10*3/uL (ref 150–400)
RBC: 3.18 MIL/uL — ABNORMAL LOW (ref 3.87–5.11)
RDW: 15.8 % — ABNORMAL HIGH (ref 11.5–15.5)
WBC: 10.1 10*3/uL (ref 4.0–10.5)
nRBC: 0 % (ref 0.0–0.2)

## 2019-01-14 LAB — BASIC METABOLIC PANEL
Anion gap: 10 (ref 5–15)
BUN: 12 mg/dL (ref 6–20)
CO2: 24 mmol/L (ref 22–32)
Calcium: 8.3 mg/dL — ABNORMAL LOW (ref 8.9–10.3)
Chloride: 105 mmol/L (ref 98–111)
Creatinine, Ser: 0.67 mg/dL (ref 0.44–1.00)
GFR calc Af Amer: >60 mL/min (ref >60)
GFR calc non Af Amer: >60 mL/min (ref >60)
GLUCOSE: 137 mg/dL — AB (ref 70–99)
Potassium: 3.7 mmol/L (ref 3.5–5.1)
Sodium: 139 mmol/L (ref 135–145)

## 2019-01-14 LAB — HEMOGLOBIN: Hemoglobin: 9.9 g/dL — ABNORMAL LOW (ref 12.0–15.0)

## 2019-01-14 MED ORDER — LOSARTAN POTASSIUM 25 MG PO TABS
25.0000 mg | ORAL_TABLET | Freq: Every day | ORAL | Status: DC
  Filled 2019-01-14: qty 1

## 2019-01-14 MED ORDER — METOPROLOL SUCCINATE ER 25 MG PO TB24
25.0000 mg | ORAL_TABLET | Freq: Every day | ORAL | Status: DC
  Administered 2019-01-15: 25 mg via ORAL
  Filled 2019-01-14: qty 1

## 2019-01-14 MED ORDER — CLOPIDOGREL BISULFATE 75 MG PO TABS: 75.0000 mg | ORAL_TABLET | Freq: Every day | ORAL | 12 refills | Status: DC

## 2019-01-14 MED ORDER — ATORVASTATIN CALCIUM 80 MG PO TABS: 80.0000 mg | ORAL_TABLET | Freq: Every day | ORAL | 3 refills | Status: DC

## 2019-01-14 MED ORDER — ASPIRIN 81 MG PO TBEC: 81.0000 mg | DELAYED_RELEASE_TABLET | Freq: Every day | ORAL | 3 refills | Status: AC

## 2019-01-14 MED ORDER — LOSARTAN POTASSIUM 25 MG PO TABS: 25.0000 mg | ORAL_TABLET | Freq: Every day | ORAL | 3 refills | Status: DC

## 2019-01-14 MED ORDER — METOPROLOL SUCCINATE ER 25 MG PO TB24: 25.0000 mg | ORAL_TABLET | Freq: Every day | ORAL | 3 refills | Status: DC

## 2019-01-14 NOTE — Progress Notes (Signed)
Cardiovascular and Pulmonary Nurse Navigator Note:    52 year old female with hx of CAD with stable angina, former smoker, GERD, HTN, PVCs, AAA, PAD, HLD.  Patient underwent outpatient cardiac CTA detailing RCA disease with severe stenosis. Patient presented to [facility] for outpatient Cardiac Cath on 01/12/2018.   [facility]  CARDIAC CATHETERIZATION  Order# [id number]  Reading physician: Minna Merritts, MD Ordering physician: Minna Merritts, MD Study date: 01/13/19  Physicians   Panel Physicians Referring Physician Case Authorizing Physician  Minna Merritts, MD (Primary)    Procedures   LEFT HEART CATH AND CORONARY ANGIOGRAPHY  Conclusion    Ost LAD lesion is 30% stenosed.  Ost 1st Mrg to 1st Mrg lesion is 60% stenosed.  Prox Cx to Mid Cx lesion is 40% stenosed.  Prox RCA lesion is 75% stenosed.  Dist RCA lesion is 80% stenosed.  The left ventricular ejection fraction is greater than 65% by visual estimate.  LV end diastolic pressure is normal.  The left ventricular systolic function is normal.  There is no mitral valve regurgitation.    Panel Physicians Referring Physician Case Authorizing Physician  End, Harrell Gave, MD (Primary)    Procedures   CORONARY STENT INTERVENTION  Conclusion   Conclusions: 1. Severe single vessel coronary artery disease with diffuse disease involving the RCA with focal lesions of up to 80% involving the proximal and distal vessel (see diagnostic angiography report by Dr. Rockey Situ for details). 2. Successful PCI to the proximal RCA using a Resolute Onyx 2.75 x 22 mm drug-eluting stent with 0% residual stenosis and TIMI-3 flow. 3. Successful PCI to the distal RCA using a Resolute Onyx 2.75 x 18 mm drug-eluting stent with 0% residual stenosis and TIMI-3 flow.  Recommendations: 1. Overnight extended recovery. 2. Dual antiplatelet therapy with aspirin and clopidogrel for at least 12 months, ideally longer given multivessel coronary  artery and peripheral vascular disease. 3. Aggressive secondary prevention.  Nelva Bush, MD [facility] Pager: [phone]    "Angioplasty and Stents" booklet given and reviewed with patient. Discussed the definition of CAD. Reviewed the location of CAD and where her stent was placed. Informed patient she will be given a stent card. Explained the purpose of the stent card. Instructed patient to keep stent card in her wallet.  ? Discussed modifiable risk factors including controlling blood pressure, cholesterol, and blood sugar; following heart healthy diet; maintaining healthy weight; exercise; and smoking cessation, if applicable.  ? Discussed cardiac medications including rationale for taking, mechanisms of action, and side effects. Stressed the importance of taking medications as prescribed.  ? Discussed emergency plan for heart attack symptoms. Patient verbalized understanding of need to call 911 and not to drive himself to ER if having cardiac symptoms / chest pain.  ? Heart healthy diet of low sodium, low fat, low cholesterol discussed. Patient's barrier with being compliant with her diet hinges on what she can purchase with very little money/food stamps.    Smoking Cessation - Patient is a FORMER smoker.  Patient stated, "I quit smoking last October when I  had my aneurysm surgery." ? Exercise - Benefits of exercised discussed. Informed patient that her cardiologist has referred her to outpatient Cardiac Rehab. An overview of the program was provided. Brochure, informational letter, class and orientation times, and CPT billing codes given to patient.  Patient shared with this RN that she does not have any health insurance and was denied Medicaid.  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 Patient is hoping  to have health insurance again by July.  Patient stated, "I know I really need to do this program, but I do not have the  financial resources right now to pay for this program."  Patient has met with the financial counselor, Lizbeth Bark, this admission.  Patient was encouraged to complete the [facility] financial assistance forms.  In addition, this RN informed patient if she is approved for Medicaid, Medicaid will cover 100% of Cardiac Rehab.  Patient also informed the patient the referral for Cardiac Rehab is good for one year.    Patient appreciative of the information.   ? Roanna Epley, RN, BSN, [place]  [facility] Cardiac & Pulmonary Rehab  Cardiovascular & Pulmonary Nurse Navigator  Direct Line: [phone]  Department Phone #: [phone] Fax: [phone]  Email Address: [email or web address]

## 2019-01-14 NOTE — Progress Notes (Signed)
[facility] Post-PCI Note  Date: 01/14/19 Time: 7:42 AM  Subjective: Heather Mahoney feels well this morning other than lightheadedness when sitting up.  She initially did well following PCI yesterday but developed left lower quadrant/flank pain while transferring from stretcher to bed on the floor.  She subsequently got up to urinate and became profoundly lightheaded.  She was found to be hypotensive and bradycardic, prompting initiation of a rapid response.  Manual compression was applied to the left groin and the patient evaluated by Dr. Rockey Situ.  Stat CT of the abdomen and pelvis revealed hematoma extending from the left groin up to the left flank.  Small amount of bleeding also noted in the right groin.  FemoStop was applied and transitioned to a safeguard overnight.  This morning, [name] notes only mild soreness in her left flank.  She felt nauseated and short of breath when sitting up last night, though this has resolved.  She denies chest pain.  Objective: Temp:  [97.7 F (36.5 C)-98.6 F (37 C)] 98.6 F (37 C) (03/27 0400) Pulse Rate:  [46-105] 50 (03/27 0700) Resp:  [10-20] 12 (03/27 0700) BP: (59-156)/(48-100) 104/64 (03/27 0700) SpO2:  [87 %-100 %] 97 % (03/27 0700) Weight:  [74.4 kg-74.5 kg] 74.4 kg (03/26 1700) General: Well-developed, well-nourished woman lying comfortably in bed. HEENT: Conjunctival pallor noted.  No scleral icterus. Vascular: Safeguard overlies left femoral arteriotomy.  Site is soft without hematoma or bruit.  Left pedal pulses are 1+.  Right groin is soft without significant hematoma.  Labs: CBC: WBC 10.1, HGB 8.5 (9.6 last night), HCT 26.5, platelets 347 BMP: Sodium 139, potassium 3.7, chloride 105, CO2 24, BUN 12, creatinine 0.7, calcium 8.3, glucose 137  EKG (01/14/2019-personally reviewed): Sinus bradycardia with isolated PVC nonspecific ST changes suggestive of early repolarization.  Assessment/plan: Heather Mahoney is a  53 year old woman with history of PAD and recently diagnosed CAD status post DES x2 to the proximal and distal RCA yesterday.  Postprocedural course was complicated by bleeding from the left femoral access site with transient hypotension.  This has resolved with IV fluid and manual compression.  I personally reviewed the CT of the abdomen and pelvis, showing hemorrhage extending along the left flank.  I suspect based on the location that it was from bleeding at the arteriotomy site that occurred when the patient transferred from the stretcher to her bed rather than more proximal vascular injury.  Heather Mahoney is now hemodynamically stable.  I will hold all antihypertensive medications, including metoprolol, amlodipine, and losartan, given that her blood pressure remains low normal.  Careful reinitiation could be considered as blood pressure allows.  Serial hemoglobins should be followed with monitoring in the hospital for at least 1 more night.  I would avoid blood transfusion unless patient has symptomatic anemia or hemoglobin drops below 7.  Aspirin and clopidogrel should be continued in the setting of stent placement yesterday.  Further management per Dr. Rockey Situ.  Nelva Bush, MD [facility] Pager: [phone]

## 2019-01-14 NOTE — Progress Notes (Signed)
CHIEF COMPLAINT:  Follow up shock and abd pain Subjective  abd pain better bp Stable CBC stable No acute issues    Review of Systems:  Gen:  Denies  fever, sweats, chills weigh loss  HEENT: Denies blurred vision, double vision, ear pain, eye pain, hearing loss, nose bleeds, sore throat Cardiac:  No dizziness, chest pain or heaviness, chest tightness,edema, No JVD Resp:   No cough, -sputum production, -shortness of breath,-wheezing, -hemoptysis,  Gi: mild abd pain Gu:  Denies bladder incontinence, burning urine Ext:   Denies Joint pain, stiffness or swelling Skin: Denies  skin rash, easy bruising or bleeding or hives Endoc:  Denies polyuria, polydipsia , polyphagia or weight change Psych:   Denies depression, insomnia or hallucinations  Other:  All other systems negative      Objective    Examination:  General exam: Appears calm and comfortable  Respiratory system: Clear to auscultation. Respiratory effort normal. HEENT: [place]/AT, PERRLA, no thrush, no stridor. Cardiovascular system: S1 & S2 heard, RRR. No JVD, murmurs, rubs, gallops or clicks. No pedal edema. Gastrointestinal system: Abdomen is nondistended, soft and nontender. No organomegaly or masses felt. Normal bowel sounds heard. Central nervous system: Alert and oriented. No focal neurological deficits. Extremities: Symmetric 5 x 5 power. Skin: No rashes, lesions or ulcers Psychiatry: Judgement and insight appear normal. Mood & affect appropriate.   VITALS:  height is 5\' 6"  (1.676 m) and weight is 74.4 kg. Her oral temperature is 98.6 F (37 C). Her blood pressure is 104/64 and her pulse is 50 (abnormal). Her respiration is 12 and oxygen saturation is 97%.   I personally reviewed Labs under Results section.  Radiology Reports Ct Abdomen Pelvis Wo Contrast  Result Date: 01/13/2019 CLINICAL DATA:  Bleeding from the groin following catheterization today. Hypotension. EXAM: CT ABDOMEN AND PELVIS WITHOUT CONTRAST  TECHNIQUE: Multidetector CT imaging of the abdomen and pelvis was performed following the standard protocol without IV contrast. COMPARISON:  08/10/2018 FINDINGS: Lower chest: Clear lung bases.  Heart normal size. Hepatobiliary: No focal liver abnormality is seen. No gallstones, gallbladder wall thickening, or biliary dilatation. Pancreas: Unremarkable. No pancreatic ductal dilatation or surrounding inflammatory changes. Spleen: Normal in size without focal abnormality. Adrenals/Urinary Tract: No adrenal masses. Kidneys normal in size, orientation and position. No renal masses. Residual contrast from the catheterization is noted in the intrarenal collecting systems and ureters. No hydronephrosis or hydroureter. Dense contrast fills the mildly distended bladder. No bladder abnormality. Stomach/Bowel: Stomach is unremarkable. Small bowel and colon are normal in caliber. No wall thickening or inflammation. There scattered colonic diverticula. No diverticulitis. Normal appendix visualized. Vascular/Lymphatic: Changes from previous endovascular stent graft exclusion of an abdominal aortic aneurysm. There is hemorrhage within the subcutaneous soft tissues of the left inguinal region extending from the femoral vessels. Hemorrhage extends superiorly along the ileo psoas muscle into the left lower quadrant where lies along the left pararenal fascia and lateral conal fascia. There is also stranding and increased attenuation in the subcutaneous soft tissues of the right inguinal region suggesting a recent endovascular puncture on this side as well. No enlarged lymph nodes. Reproductive: Uterus and bilateral adnexa are unremarkable. Other: No intraperitoneal hemorrhage or ascites. Musculoskeletal: No acute or significant osseous findings. IMPRESSION: 1. There is hemorrhage from the left inguinal artery puncture that lies within the left inguinal soft tissues, extending superiorly, across the left extraperitoneal pelvis to the  extraperitoneal left lower abdomen and flank. 2. A small amount of apparent hemorrhage lies in the  soft tissues of the right inguinal region, no evidence of right pelvic hemorrhage. 3. No intraperitoneal hemorrhage. 4. No other acute abnormality. Electronically Signed   By: Lajean Manes M.D.   On: 01/13/2019 17:22       Assessment/Plan:  ACUTE BLEED INTO EXTRO PERITONEAL AREA IVF's as needed Follow CBC Fem stop per cardiology Follow up recs    Corrin Parker, M.D.  [facility]  Medical Director [place] Director [facility] Cardio-Pulmonary Department

## 2019-01-14 NOTE — Discharge Instructions (Signed)
You have received care from Heather Mahoney during this hospital stay and we look forward to continuing to provide you with excellent care in our office settings after you've left the hospital.  In order to assure a smoother transition to home following your discharge from the hospital, we plan to set you up for a VIRTUAL appointment within 2 weeks of discharge with Dr. Rockey Situ. Please wait for our call for further information on this visit. If you have any concerns, please call: [facility] - [phone] -------------------------------------- CATHETERIZATION CARE GUIDELINES: NO HEAVY LIFTING X 4 WEEKS. NO SEXUAL ACTIVITY X 4 WEEKS. NO SOAKING BATHS, HOT TUBS, POOLS, ETC., X 7 DAYS.  Groin Site Care Refer to this sheet in the next few weeks. These instructions provide you with information on caring for yourself after your procedure. Your caregiver may also give you more specific instructions. Your treatment has been planned according to current medical practices, but problems sometimes occur. Call your caregiver if you have any problems or questions after your procedure. HOME CARE INSTRUCTIONS  You may shower 24 hours after the procedure. Remove the bandage (dressing) and gently wash the site with plain soap and water. Gently pat the site dry.   Do not apply powder or lotion to the site.   Do not sit in a bathtub, swimming pool, or whirlpool for 5 to 7 days.   No bending, squatting, or lifting anything over 10 pounds (4.5 kg) as directed by your caregiver.   Inspect the site at least twice daily.   Do not drive home if you are discharged the same day of the procedure. Have someone else drive you.  What to expect:  Any bruising will usually fade within 1 to 2 weeks.   Blood that collects in the tissue (hematoma) may be painful to the touch. It should usually decrease in size and tenderness within 1 to 2 weeks.  SEEK IMMEDIATE MEDICAL CARE IF:  You  have unusual pain at the groin site or down the affected leg.   You have redness, warmth, swelling, or pain at the groin site.   You have drainage (other than a small amount of blood on the dressing).   You have chills.   You have a fever or persistent symptoms for more than 72 hours.   You have a fever and your symptoms suddenly get worse.   Your leg becomes pale, cool, tingly, or numb.  You have heavy bleeding from the site. Hold pressure on the site.      10 Habits of Highly Healthy [place] wants to help you get well and stay well.  Live a longer, healthier life by practicing healthy habits every day.  1.  Visit your primary care provider regularly. 2.  Make time for family and friends.  Healthy relationships are important. 3.  Take medications as directed by your provider. 4.  Maintain a healthy weight and a trim waistline. 5.  Eat healthy meals and snacks, rich in fruits, vegetables, whole grains, and lean proteins. 6.  Get moving every day - aim for 150 minutes of moderate physical activity each week. 7.  Don't smoke. 8.  Avoid alcohol or drink in moderation. 9.  Manage stress through meditation or mindful relaxation. 10.  Get seven to nine hours of quality sleep each night.  Want more information on healthy habits?  To learn more about these and other healthy habits, visit [employer].

## 2019-01-14 NOTE — Progress Notes (Signed)
Patient up to [facility] with one assist.

## 2019-01-14 NOTE — Progress Notes (Signed)
PAD was removed today, no bleeding noted, clear dressing applied. Up to [facility] with one assist. Awaiting bed on 2A.

## 2019-01-14 NOTE — Discharge Summary (Signed)
Discharge Summary    Patient ID: Heather Mahoney MRN: [id number]; DOB: 1966-09-06  Admit date: 01/13/2019 Discharge date: 01/14/2019  Primary Care Provider: Patient, No Pcp Per  Primary Cardiologist: Ida Rogue, MD  Primary Electrophysiologist:  None   Discharge Diagnoses    Principal Problem:   Unstable angina [facility]) Active Problems:   CAD (coronary artery disease)   Hyperlipidemia   Essential hypertension, benign   Tobacco use disorder   Mitral regurgitation   PVC (premature ventricular contraction)   Allergies Allergies  Allergen Reactions   Strawberry Extract Hives and Swelling   Strawberry Flavor Rash    Diagnostic Studies/Procedures    LHC  01/13/2019 Coronary Findings   Diagnostic  Dominance: Right  Right Coronary Artery  Prox RCA lesion 75% stenosed  Prox RCA lesion is 75% stenosed.  Mid RCA lesion 60% stenosed  Mid RCA lesion is 60% stenosed.  Dist RCA-1 lesion 80% stenosed  Dist RCA-1 lesion is 80% stenosed.  Dist RCA-2 lesion 50% stenosed with side branch in Post Atrio 50% stenosed  Dist RCA-2 lesion is 50% stenosed with 50% stenosed side branch in Post Atrio.  Intervention   Prox RCA lesion  Stent  Lesion length: 20 mm. CATH VISTA GUIDE 6FR JR4 guide catheter was inserted. Lesion crossed with guidewire using a WIRE RUNTHROUGH .[id number]. Pre-stent angioplasty was performed using a BALLOON TREK RX 2.5X12. Maximum pressure: 10 atm. A drug-eluting stent was successfully placed using a STENT RESOLUTE ONYX [id number]. Maximum pressure: 16 atm. Stent strut is well apposed. Stent does not overlap previously placed stentPost-stent angioplasty was performed using a BALLOON [place] TREK RX 3.0X15. Maximum pressure: 16 atm.  Post-Intervention Lesion Assessment  The intervention was successful. Pre-interventional TIMI flow is 3. Post-intervention TIMI flow is 3. No complications occurred at this lesion.  There is a 0% residual stenosis post intervention.  Dist RCA-1  lesion  Stent  Lesion length: 15 mm. CATH VISTA GUIDE 6FR JR4 guide catheter was inserted. Lesion crossed with guidewire using a WIRE RUNTHROUGH .[id number]. Pre-stent angioplasty was performed using a BALLOON TREK RX 2.5X12. Maximum pressure: 8 atm. A drug-eluting stent was successfully placed using a [place] [id number]. Maximum pressure: 16 atm. Stent strut is well apposed. Post-stent angioplasty was performed using a BALLOON [place] TREK RX 3.0X15. Maximum pressure: 16 atm.  Post-Intervention Lesion Assessment  The intervention was successful. Pre-interventional TIMI flow is 3. Post-intervention TIMI flow is 3. No complications occurred at this lesion.  There is a 0% residual stenosis post intervention.  Coronary Diagrams   Diagnostic  Dominance: Right    Intervention     Conclusions: 1. Severe single vessel coronary artery disease with diffuse disease involving the RCA with focal lesions of up to 80% involving the proximal and distal vessel (see diagnostic angiography report by Dr. Rockey Situ for details). 2. Successful PCI to the proximal RCA using a Resolute Onyx 2.75 x 22 mm drug-eluting stent with 0% residual stenosis and TIMI-3 flow. 3. Successful PCI to the distal RCA using a Resolute Onyx 2.75 x 18 mm drug-eluting stent with 0% residual stenosis and TIMI-3 flow. Recommendations: 1. Overnight extended recovery. 2. Dual antiplatelet therapy with aspirin and clopidogrel for at least 12 months, ideally longer given multivessel coronary artery and peripheral vascular disease. 3. Aggressive secondary prevention. Nelva Bush, MD [place] Pager: [phone]  CT abdomen pelvis without contrast IMPRESSION: 1. There is hemorrhage from the left inguinal artery puncture that lies within the left inguinal soft tissues, extending  superiorly, across the left extraperitoneal pelvis to the extraperitoneal left lower abdomen and flank. 2. A small amount of apparent hemorrhage  lies in the soft tissues of the right inguinal region, no evidence of right pelvic hemorrhage. 3. No intraperitoneal hemorrhage. 4. No other acute abnormality.  _____________   History of Present Illness     53 yo female with a history of hypertension, PVCs, syncope 2/2 orthostasis, HLD, GERD, and ongoing tobacco abuse. In addition, her family history was significant for CAD, including a mother with bypass surgery. It was also noted she had a maternal grandmother that passed away at 59 from a brain aneurysm.   Due to her PVCs, she wore a holter monitor in 2017 that showed overall SR with average HR of 88bpm, minimal HR 57bpm, and maximal HR 136bpm. 10% of total beats in tachycardia and longest RR interval was 1.46 seconds. Rare PACs and a significant number of PVCs and ventricular couplets were noted with 12% total beats identified as ventricular beats. No documented Afib. 2017 echo showed EF 55-60% and NWMA. It was also noted she had mild MR and trivial pericardial effusion. 2017 stress test was performed and ruled low risk scan. She underwent cardiac 01/05/2019 cardiac CT, which showed severe stenosis of mid RCA (FFR less than 0.7), as well as indeterminate stenosis of LAD and circumflex (FFR less than 0.8).   She presented to [facility] outpatient on 01/07/2019 for follow-up after cardiac CT. Vitals showed blood pressure was not well controlled at 142/80. In addition, her LDL was 185 with total cholesterol of 266. EKG showed NSR with occasional PVCs and at 73 bpm. At that time, she continued to work at [employer] and had noted SOB with exertion that resulted in her often needing to stop and catch her breath. She also reported chest tightness. Sometimes, she reportedly felt SOB when trying to get comfortable in bed as well. Given her symptoms and risk factors, as well as recent cardiac CT findings, she was scheduled for cardiac catheterization to occur 01/13/2019.   Hospital Course       01/13/2019: She presented to [facility] left heart cardiac catheterization, which showed severe and diffuse disease involving the RCA and focal lesions of up to 80% and involving the proximal and distal vessel (see angiography report as copied and pasted in imaging).  She then underwent successful PCI to the proximal and distal RCA using drug-eluting stent with 0% residual stenosis and TIMI-3 flow.  She initially did well after her cardiac catheterization procedure; however, while being transferred from the stretcher to bed on the floor, she developed left lower quadrant pain and flank pain.  She felt lightheaded when she got up to urinate and was found to be bradycardic and transiently hypotensive.  It was observed that she had bleeding from the left femoral access site.  Manual compression was applied to the left groin area, as well as IVF administered, and the patient was evaluated by Dr. Rockey Situ of [facility] cardiology.  Stat CT of the abdomen and pelvis revealed hematoma, extending from the left groin up to the left flank and small amount of blood in the right groin area.  It was thought that, based on the location, this hemorrhage was from bleeding at the arteriotomy site that occurred when the patient transferred from stretcher to bed, rather than more proximal vascular injury.  FemoStop was applied and transitioned to a safeguard overnight.  Her antihypertensives were also held due to the hypotension and serial hemoglobins followed.  Overnight, she improved, reporting only briefly feeling nauseated and short of breath while sitting up at night.   The next day 3/27, she noted only mild soreness in her left flank area and successfully ambulated to her bedside commode with assistance and had a bowel movement, which also relieved abdominal pressure. With ongoing stable pressures, it was thought she could restart low dose Toprol XL 81m qd and losartan 220mdaily the next day 3/28.   Today, 3/28, she has been examined  and deemed stable for discharge by MD.  She is hemodynamically stable with both labs and vitals review and within limits. Left groin cardiac catheterization site has been examined and is without signs of infection or acute bleeding, and the dressing is clean, dry, and intact.    Discharge instructions have been provided and are included in the patient's AVS, which specify cardiac catheterization site care instructions, as well as her discharge medications and plan for follow-up.  She will stop taking amlodipine 10 mg and isosorbide mononitrate 30 mg daily, due to her earlier hypotension.  She will be discharged home with DAPT of aspirin 81 mg daily and clopidogrel 75 mg daily, Toprol XL 255maily in the AM, atorvastatin 74m20mily, and losartan 25 mg tablet daily in the PM. She has been scheduled for follow-up virtual appointment within 2 weeks of discharge, as outpatient visits are not available due to current COVID-19 pandemic.  In the meantime, she has been provided with the telephone number to reach our office in the event of any questions or concerns.  No outpatient labs or imaging studies are needed prior to her upcoming virtual TCM visit.   Consultants: None  _____________  Discharge Vitals Blood pressure (!) 145/90, pulse 75, temperature 98.1 F (36.7 C), temperature source Axillary, resp. rate 17, height 5' 6" (1.676 m), weight 74.4 kg, SpO2 100 %.  Filed Weights   01/13/19 0812 01/13/19 1700  Weight: 74.5 kg 74.4 kg    Labs & Radiologic Studies    CBC Recent Labs    01/13/19 1835 01/14/19 0627 01/14/19 1628  WBC 14.1* 10.1  --   NEUTROABS 10.5*  --   --   HGB 9.6* 8.5* 9.9*  HCT 29.8* 26.5*  --   MCV 83.0 83.3  --   PLT 377 347  --    Basic Metabolic Panel Recent Labs    01/14/19 0627  NA 139  K 3.7  CL 105  CO2 24  GLUCOSE 137*  BUN 12  CREATININE 0.67  CALCIUM 8.3*   Liver Function Tests No results for input(s): AST, ALT, ALKPHOS, BILITOT, PROT, ALBUMIN in  the last 72 hours. No results for input(s): LIPASE, AMYLASE in the last 72 hours. Cardiac Enzymes No results for input(s): CKTOTAL, CKMB, CKMBINDEX, TROPONINI in the last 72 hours. BNP Invalid input(s): POCBNP D-Dimer No results for input(s): DDIMER in the last 72 hours. Hemoglobin A1C No results for input(s): HGBA1C in the last 72 hours. Fasting Lipid Panel No results for input(s): CHOL, HDL, LDLCALC, TRIG, CHOLHDL, LDLDIRECT in the last 72 hours. Thyroid Function Tests No results for input(s): TSH, T4TOTAL, T3FREE, THYROIDAB in the last 72 hours.  Invalid input(s): FREET3 _____________  Ct Abdomen Pelvis Wo Contrast  Result Date: 01/13/2019 CLINICAL DATA:  Bleeding from the groin following catheterization today. Hypotension. EXAM: CT ABDOMEN AND PELVIS WITHOUT CONTRAST TECHNIQUE: Multidetector CT imaging of the abdomen and pelvis was performed following the standard protocol without IV contrast. COMPARISON:  08/10/2018 FINDINGS: Lower chest:  Clear lung bases.  Heart normal size. Hepatobiliary: No focal liver abnormality is seen. No gallstones, gallbladder wall thickening, or biliary dilatation. Pancreas: Unremarkable. No pancreatic ductal dilatation or surrounding inflammatory changes. Spleen: Normal in size without focal abnormality. Adrenals/Urinary Tract: No adrenal masses. Kidneys normal in size, orientation and position. No renal masses. Residual contrast from the catheterization is noted in the intrarenal collecting systems and ureters. No hydronephrosis or hydroureter. Dense contrast fills the mildly distended bladder. No bladder abnormality. Stomach/Bowel: Stomach is unremarkable. Small bowel and colon are normal in caliber. No wall thickening or inflammation. There scattered colonic diverticula. No diverticulitis. Normal appendix visualized. Vascular/Lymphatic: Changes from previous endovascular stent graft exclusion of an abdominal aortic aneurysm. There is hemorrhage within the  subcutaneous soft tissues of the left inguinal region extending from the femoral vessels. Hemorrhage extends superiorly along the ileo psoas muscle into the left lower quadrant where lies along the left pararenal fascia and lateral conal fascia. There is also stranding and increased attenuation in the subcutaneous soft tissues of the right inguinal region suggesting a recent endovascular puncture on this side as well. No enlarged lymph nodes. Reproductive: Uterus and bilateral adnexa are unremarkable. Other: No intraperitoneal hemorrhage or ascites. Musculoskeletal: No acute or significant osseous findings. IMPRESSION: 1. There is hemorrhage from the left inguinal artery puncture that lies within the left inguinal soft tissues, extending superiorly, across the left extraperitoneal pelvis to the extraperitoneal left lower abdomen and flank. 2. A small amount of apparent hemorrhage lies in the soft tissues of the right inguinal region, no evidence of right pelvic hemorrhage. 3. No intraperitoneal hemorrhage. 4. No other acute abnormality. Electronically Signed   By: Lajean Manes M.D.   On: 01/13/2019 17:22   Ct Coronary Morph W/cta Cor W/score W/ca W/cm &/or Wo/cm  Addendum Date: 01/05/2019   ADDENDUM REPORT: 01/05/2019 10:41 HISTORY: chest pain EXAM: Cardiac/Coronary  CT TECHNIQUE: The patient was scanned on a [employer]. PROTOCOL: A 120 kV prospective scan was triggered in the descending thoracic aorta at 111 HU's. Axial non-contrast 3 mm slices were carried out through the heart. The data set was analyzed on a dedicated work station and scored using the [place]. Gantry rotation speed was 250 msecs and collimation was .6 mm. Beta blockade and 0.8 mg of sl NTG was given. The 3D data set was reconstructed in 5% intervals of the 67-82 % of the R-R cycle. Diastolic phases were analyzed on a dedicated work station using MPR, MIP and VRT modes. The patient received 36m OMNIPAQUE IOHEXOL 350 MG/ML  SOLN of contrast. FINDINGS: Coronary calcium score: The patient's coronary artery calcium score is 51, which places the patient in the 94 percentile. Coronary arteries: Normal coronary origins.  Right dominance. Right Coronary Artery: Proximal RCA atherosclerotic plaque with moderate stenosis (50-69%). Possible severe stenosis in the mid RCA, 70-99 % stenosis, atherosclerotic plaque. This area is poorly visualized due to motion and image resolution. Left Main Coronary Artery: No detectable plaque or stenosis. Left Anterior Descending Coronary Artery: Tubular plaque progresses from mild to severe stenosis in proximal LAD. At narrowest point, severe mixed atherosclerotic plaque with positive remodeling, 70-99% stenosis. Mild diffuse plaque in mid and distal LAD. Left Circumflex Artery: Severe stenosis in the proximal L circumflex with atherosclerotic plaque, 70-99% stenosis. Aorta: Normal size, 34 mm at the mid ascending aorta (level of the PA bifurcation) measured double oblique. No calcifications. No dissection. Aortic Valve: No calcifications. Other findings: Normal pulmonary vein drainage into the left atrium.  Normal left atrial appendage without a thrombus. Normal size of the pulmonary artery. IMPRESSION: 1. Severe CAD, CADRADS = 4V. Severe atherosclerotic plaque with 70-99% stenosis in the proximal LAD, proximal L circumflex and mid RCA. Positive remodeling with spotty calcifications in the proximal LAD, suggesting vulnerable plaque characteristics. 2. The patient's coronary artery calcium score is 51, which places the patient in the 94 percentile. 3. Normal coronary origin with right dominance. Electronically Signed   By: Cherlynn Kaiser   On: 01/05/2019 10:41   Result Date: 01/05/2019 EXAM: OVER-READ INTERPRETATION  CT CHEST The following report is an over-read performed by radiologist Dr. Markus Daft of [facility], [place] on 01/04/2019. This over-read does not include interpretation of cardiac or  coronary anatomy or pathology. The coronary calcium score/coronary CTA interpretation by the cardiologist is attached. COMPARISON:  Chest radiograph 11/04/2017 FINDINGS: Vascular: Mild atherosclerotic disease in the descending thoracic aorta. Normal caliber of the thoracic aorta without dissection. Pulmonary arteries are not well opacified on this examination. No significant pericardial fluid. Proximal abdominal aorta measures up to 2.8 cm. Mediastinum/Nodes: Left hilar tissue on sequence 11, image 3 is prominent measuring 1.2 cm in the short axis. Right subcarinal tissue is slightly prominent. Otherwise, no significant mediastinal findings. Lungs/Pleura: Lungs are clear. Upper Abdomen: No acute abnormality in upper abdomen. Musculoskeletal: No acute bone abnormality. IMPRESSION: No acute abnormality. Mildly prominent left hilar tissue is nonspecific. Aortic Atherosclerosis (ICD10-I70.0). Electronically Signed: By: Markus Daft M.D. On: 01/04/2019 11:10   Ct Coronary Fractional Flow Reserve Data Prep  Result Date: 01/05/2019 EXAM: CT FFR ANALYSIS CLINICAL DATA:  chest pain FINDINGS: FFRct analysis was performed on the original cardiac CT angiogram dataset. Diagrammatic representation of the FFRct analysis is provided in a separate PDF document in PACS. This dictation was created using the PDF document and an interactive 3D model of the results. 3D model is not available in the EMR/PACS. Normal FFR range is >0.80. Grey zone 0.76-0.80 1. Left Main:  No significant stenosis. FFR = 0.98 2. LAD: Proximal FFR = 0.77, Mid FFR = 0.75, Distal FFR = 0.73 3. LCX: Proximal FFR = 0.79, Distal FFR = 0.68 4. RCA: Proximal FFR = 0.95, Mid FFR = 0.67, Distal FFR = 0.52 IMPRESSION: 1. CT FFR analysis showed hemodynamically significant stenosis in the mid RCA. Indeterminate FFR in the proximal LAD and proximal L circumflex lesions. Consider coronary angiography. Electronically Signed   By: Cherlynn Kaiser   On: 01/05/2019 10:49    Disposition     Vital Signs. BP (!) 145/90    Pulse 75    Temp 98.1 F (36.7 C) (Axillary)    Resp 17    Ht 5' 6" (1.676 m)    Wt 74.4 kg    SpO2 100%    BMI 26.47 kg/m    General: Well developed, well nourished, in no acute distress Head: Normocephalic, atraumatic, sclera non-icteric, no xanthomas, nares are without discharge. Neck: Negative for carotid bruits. JVP not elevated. Lungs: Clear bilaterally to auscultation without wheezes, rales, or rhonchi. Breathing is unlabored. Heart: RRR S1 S2 without murmurs, rubs, or gallops.  Abdomen: Soft, non-tender, non-distended with normoactive bowel sounds. No rebound/guarding. Mild groin left lower quadrant tenderness on palpation Extremities: No clubbing or cyanosis. No edema. Distal pedal pulses are 2+ and equal bilaterally. Neuro: Alert and oriented X 3. Moves all extremities spontaneously. Psych:  Responds to questions appropriately with a normal affect.   Pt is being discharged home today in good condition.  Follow-up  Plans & Appointments    Follow-up Information    [facility] Card[facility] Pulmonary Rehab Follow up.   Specialty:  Cardiac Rehabilitation Why:  Your cardiologist has referred you to outpatient Cardiac Rehab at [facility].  Per your discussion with Roanna Epley, RN, Cardiac Nurse Navigator, you have declined to participate in the Cardiac Rehab program at this time.   Contact information: [place] [phone] [address] [place] [phone]       Minna Merritts, MD Follow up.   Specialty:  Cardiology Why:  You will receive a call from our staff to schedule and help walk you through your virtual follow-up with Dr. Rockey Situ in the next two weeks. Contact information: [place] [address] [phone]          Discharge Instructions    AMB Referral to Cardiac Rehabilitation - Phase II   Complete by:  As directed    Diagnosis:  Coronary Stents   Call MD for:   difficulty breathing, headache or visual disturbances   Complete by:  As directed    Call MD for:  extreme fatigue   Complete by:  As directed    Call MD for:  hives   Complete by:  As directed    Call MD for:  persistant dizziness or light-headedness   Complete by:  As directed    Call MD for:  persistant nausea and vomiting   Complete by:  As directed    Call MD for:  redness, tenderness, or signs of infection (pain, swelling, redness, odor or green/yellow discharge around incision site)   Complete by:  As directed    Call MD for:  severe uncontrolled pain   Complete by:  As directed    Call MD for:  temperature >100.4   Complete by:  As directed    Diet - low sodium heart healthy   Complete by:  As directed    Increase activity slowly   Complete by:  As directed       Discharge Medications   Allergies as of 01/14/2019      Reactions   Strawberry Extract Hives, Swelling   Strawberry Flavor Rash      Medication List    STOP taking these medications   amLODipine 10 MG tablet Commonly known as:  NORVASC   isosorbide mononitrate 30 MG 24 hr tablet Commonly known as:  IMDUR     TAKE these medications   aspirin 81 MG EC tablet Take 1 tablet (81 mg total) by mouth daily.   atorvastatin 80 MG tablet Commonly known as:  LIPITOR Take 1 tablet (80 mg total) by mouth daily.   clopidogrel 75 MG tablet Commonly known as:  PLAVIX Take 1 tablet (75 mg total) by mouth daily.   docusate sodium 100 MG capsule Commonly known as:  COLACE Take 1 capsule (100 mg total) by mouth daily.   losartan 25 MG tablet Commonly known as:  COZAAR Take 1 tablet (25 mg total) by mouth daily. What changed:    medication strength  how much to take   metoprolol succinate 25 MG 24 hr tablet Commonly known as:  TOPROL-XL Take 1 tablet (25 mg total) by mouth daily. What changed:  how much to take   oxyCODONE-acetaminophen 5-325 MG tablet Commonly known as:  PERCOCET/ROXICET Take 1  tablet by mouth every 6 (six) hours as needed for moderate pain or severe pain.        Acute  coronary syndrome (MI, NSTEMI, STEMI, etc) this admission?: No.    Outstanding Labs/Studies   No follow-up labs Virtual Office visit in two weeks and to be scheduled.  Duration of Discharge Encounter   Greater than 30 minutes including physician time.  Signed, Signed, Esmond Plants, MD, Ph.D [facility]

## 2019-01-14 NOTE — Progress Notes (Signed)
Progress Note  Patient Name: Heather Mahoney Done Date of Encounter: 01/14/2019  Primary Cardiologist: Ida Rogue, MD   Subjective   She stated she feels "pretty good" today. Patient denies any chest pain this afternoon / today.  She also denied palpitations, which she reported mainly occur when sleeping on her stomach, which she has not done since her hospitalization.   Earlier today, she was up (with assistance) to use the bedside commode. She had her first bowel movement since her cath, which she stated she feels helped alleviate some abdominal discomfort from gas.   Since her acute abdominal pain, which occured yesterday 3/26 after her cath and while moving from the stretcher to bed on the floor, she has not had any further significant abdominal pain, SOB/DOE, or near syncope. She did reportedly feel nausea and SOB in bed last night, as well as some lightheaded feeling while sitting up this AM. She still feels a little tender over her catheterization site; however, she denied any signs or symptoms of bleeding.   She reportedly is eager to move onto the floor and start PT, as she does not want to spend too much time in bed.   Inpatient Medications    Scheduled Meds: . aspirin EC  81 mg Oral Daily  . atorvastatin  80 mg Oral Daily  . clopidogrel  75 mg Oral Daily  . docusate sodium  100 mg Oral Daily  . sodium chloride flush  3 mL Intravenous Q12H   Continuous Infusions:  PRN Meds: acetaminophen, butalbital-acetaminophen-caffeine, ondansetron (ZOFRAN) IV, oxyCODONE-acetaminophen, sodium chloride flush   Vital Signs    Vitals:   01/14/19 0500 01/14/19 0600 01/14/19 0700 01/14/19 0800  BP: 93/78 98/69 104/64 106/79  Pulse: 71 (!) 54 (!) 50 73  Resp: 10 11 12 17   Temp:      TempSrc:      SpO2: 97% 97% 97% 98%  Weight:      Height:        Intake/Output Summary (Last 24 hours) at 01/14/2019 1131 Last data filed at 01/14/2019 0742 Gross per 24 hour  Intake 1375 ml  Output  1500 ml  Net -125 ml   Filed Weights   01/13/19 0812 01/13/19 1700  Weight: 74.5 kg 74.4 kg    Telemetry    SR/SB- Personally Reviewed  ECG     Sinus bradycardia (54bpm), occasional PVCs, IVCD. Poor conduction through lead III.  Nonspecific ST changes / early repolarization - Personally Reviewed  Physical Exam   Per MD in setting of ongoing COVID-19 pandemic and to reduce exposure.  Labs    Chemistry Recent Labs  Lab 01/07/19 1153 01/08/19 1518 01/14/19 0627  NA 143 140 139  K 4.5 3.6 3.7  CL 101 103 105  CO2 25 26 24   GLUCOSE 109* 124* 137*  BUN 10 11 12   CREATININE 0.90 0.66 0.67  CALCIUM 9.5 9.2 8.3*  PROT  --  8.0  --   ALBUMIN  --  4.1  --   AST  --  19  --   ALT  --  16  --   ALKPHOS  --  111  --   BILITOT  --  0.7  --   GFRNONAA 73 >60 >60  GFRAA 84 >60 >60  ANIONGAP  --  11 10     Hematology Recent Labs  Lab 01/08/19 1518 01/13/19 1835 01/14/19 0627  WBC 11.1* 14.1* 10.1  RBC 4.28 3.59* 3.18*  HGB 11.3* 9.6* 8.5*  HCT 35.6* 29.8* 26.5*  MCV 83.2 83.0 83.3  MCH 26.4 26.7 26.7  MCHC 31.7 32.2 32.1  RDW 15.6* 15.7* 15.8*  PLT 447* 377 347    Cardiac EnzymesNo results for input(s): TROPONINI in the last 168 hours. No results for input(s): TROPIPOC in the last 168 hours.   BNPNo results for input(s): BNP, PROBNP in the last 168 hours.   DDimer No results for input(s): DDIMER in the last 168 hours.   Radiology    Ct Abdomen Pelvis Wo Contrast  Result Date: 01/13/2019 CLINICAL DATA:  Bleeding from the groin following catheterization today. Hypotension. EXAM: CT ABDOMEN AND PELVIS WITHOUT CONTRAST TECHNIQUE: Multidetector CT imaging of the abdomen and pelvis was performed following the standard protocol without IV contrast. COMPARISON:  08/10/2018 FINDINGS: Lower chest: Clear lung bases.  Heart normal size. Hepatobiliary: No focal liver abnormality is seen. No gallstones, gallbladder wall thickening, or biliary dilatation. Pancreas:  Unremarkable. No pancreatic ductal dilatation or surrounding inflammatory changes. Spleen: Normal in size without focal abnormality. Adrenals/Urinary Tract: No adrenal masses. Kidneys normal in size, orientation and position. No renal masses. Residual contrast from the catheterization is noted in the intrarenal collecting systems and ureters. No hydronephrosis or hydroureter. Dense contrast fills the mildly distended bladder. No bladder abnormality. Stomach/Bowel: Stomach is unremarkable. Small bowel and colon are normal in caliber. No wall thickening or inflammation. There scattered colonic diverticula. No diverticulitis. Normal appendix visualized. Vascular/Lymphatic: Changes from previous endovascular stent graft exclusion of an abdominal aortic aneurysm. There is hemorrhage within the subcutaneous soft tissues of the left inguinal region extending from the femoral vessels. Hemorrhage extends superiorly along the ileo psoas muscle into the left lower quadrant where lies along the left pararenal fascia and lateral conal fascia. There is also stranding and increased attenuation in the subcutaneous soft tissues of the right inguinal region suggesting a recent endovascular puncture on this side as well. No enlarged lymph nodes. Reproductive: Uterus and bilateral adnexa are unremarkable. Other: No intraperitoneal hemorrhage or ascites. Musculoskeletal: No acute or significant osseous findings. IMPRESSION: 1. There is hemorrhage from the left inguinal artery puncture that lies within the left inguinal soft tissues, extending superiorly, across the left extraperitoneal pelvis to the extraperitoneal left lower abdomen and flank. 2. A small amount of apparent hemorrhage lies in the soft tissues of the right inguinal region, no evidence of right pelvic hemorrhage. 3. No intraperitoneal hemorrhage. 4. No other acute abnormality. Electronically Signed   By: Lajean Manes Mahoney.D.   On: 01/13/2019 17:22    Cardiac Studies    LHC  01/13/2019 Conclusions: 1. Severe single vessel coronary artery disease with diffuse disease involving the RCA with focal lesions of up to 80% involving the proximal and distal vessel (see diagnostic angiography report by Dr. Rockey Situ for details). 2. Successful PCI to the proximal RCA using a Resolute Onyx 2.75 x 22 mm drug-eluting stent with 0% residual stenosis and TIMI-3 flow. 3. Successful PCI to the distal RCA using a Resolute Onyx 2.75 x 18 mm drug-eluting stent with 0% residual stenosis and TIMI-3 flow. Recommendations: 1. Overnight extended recovery. 2. Dual antiplatelet therapy with aspirin and clopidogrel for at least 12 months, ideally longer given multivessel coronary artery and peripheral vascular disease. 3. Aggressive secondary prevention. Nelva Bush, MD [place] Pager: [phone]  CT abdomen pelvis without contrast IMPRESSION: 1. There is hemorrhage from the left inguinal artery puncture that lies within the left inguinal soft tissues, extending superiorly, across the left extraperitoneal pelvis to the  extraperitoneal left lower abdomen and flank. 2. A small amount of apparent hemorrhage lies in the soft tissues of the right inguinal region, no evidence of right pelvic hemorrhage. 3. No intraperitoneal hemorrhage. 4. No other acute abnormality.   Patient Profile     54 y.o. female with a history of CAD s/p DES x2 to proximal and distal RCA, AAA, PAD, hypertension (h/o ED visits for hypertensive urgency), hyperlipidemia, GERD, PVCs, and 40-pack-year tobacco smoking history seen for catheterization with PCI 01/14/2019.  Assessment & Plan    Coronary artery disease s/p 3/27 cardiac catheterization with stenting - Underwent LHC with PCI 01/14/2019 d/t severe single-vessel CAD with successful PCI to the proximal / distal RCA on 3/27. She initially did well after her cath; however, while transferring from stretcher to bed on the floor, she developed LLQ  pain / flank pain.  She then felt lightheaded when she got up to urinate and was found to be hypotensive and bradycardic. Manual compression was applied to the left groin and stat CT of the abdomen and pelvis revealed hematoma, extending from the left groin up to the left flank.  Small amount of bleeding was noted in the right groin. - Daily CBC, BMP recommended. Most recent K 3.7. Cr 0.67. - Started on DAPT with aspirin and clopidogrel.  Recommendation continue DAPT for at least 12 months, ideally longer given multivessel coronary artery and peripheral vascular disease. Not on a beta-blocker, given earlier bradycardic rates and hypotension. BP improved from this AM with most recent BP 129/77 and HR in the 80s. If HR continues to remain stable, could consider small dose BB/ARB before discharge. Continue statin as below. Aggressive secondary prevention. Will need PT and successful ambulation prior to discharge. Current plan to transfer to floor and keep overnight and monitor for at least another day. Tentative plan for discharge tomorrow. Will need close follow-up with labs after discharge and with plan for virtual TCM visit within 2 weeks d/t  COVID-19 precautions.  Anemia - Hgb 9.6  8.5. H&H ordered. No signs of bleeding at cath site. Continue to monitor.   Essential hypertension, recent hypotension - Still hypotensive this AM; however, most recent BP stable at 129/77. Continue to monitor for now and could consider a low dose BB/ARB before discharge if HR remains stable with stable BP. Home BP checks recommended following discharge.  Mixed hyperlipidemia -Continue statin with atorvastatin 80mg  daily for LDL goal less than 70.   PVCs  -Denies symptoms of PVCs. Continue metoprolol.  Denies any recent syncope or presyncope.    Tobacco use disorder -Cessation advised  PAD/AAA -Per vascular surgery    For questions or updates, please contact [place] Please consult [email or web address] for  contact info under        Signed, Arvil Chaco, PA-C  01/14/2019, 11:31 AM

## 2019-01-14 NOTE — Progress Notes (Signed)
Patient up to [facility] with one assist. Sitting in chair at bedside.

## 2019-01-15 ENCOUNTER — Other Ambulatory Visit: Payer: Self-pay | Admitting: Cardiovascular Disease

## 2019-01-15 DIAGNOSIS — I10 Essential (primary) hypertension: Secondary | ICD-10-CM

## 2019-01-15 DIAGNOSIS — I493 Ventricular premature depolarization: Secondary | ICD-10-CM

## 2019-01-15 DIAGNOSIS — E785 Hyperlipidemia, unspecified: Secondary | ICD-10-CM

## 2019-01-15 LAB — CBC
HCT: 26.8 % — ABNORMAL LOW (ref 36.0–46.0)
Hemoglobin: 8.6 g/dL — ABNORMAL LOW (ref 12.0–15.0)
MCH: 26.5 pg (ref 26.0–34.0)
MCHC: 32.1 g/dL (ref 30.0–36.0)
MCV: 82.5 fL (ref 80.0–100.0)
Platelets: 356 10*3/uL (ref 150–400)
RBC: 3.25 MIL/uL — ABNORMAL LOW (ref 3.87–5.11)
RDW: 15.3 % (ref 11.5–15.5)
WBC: 9.7 10*3/uL (ref 4.0–10.5)
nRBC: 0 % (ref 0.0–0.2)

## 2019-01-15 NOTE — Progress Notes (Signed)
Heather Mahoney to be D/C'd Home per MD order.  Discussed prescriptions and follow up appointments with the patient. Prescriptions given to patient, medication list explained in detail. Pt verbalized understanding.  Allergies as of 01/15/2019      Reactions   Strawberry Extract Hives, Swelling   Strawberry Flavor Rash      Medication List    STOP taking these medications   amLODipine 10 MG tablet Commonly known as:  NORVASC   isosorbide mononitrate 30 MG 24 hr tablet Commonly known as:  IMDUR     TAKE these medications   aspirin 81 MG EC tablet Take 1 tablet (81 mg total) by mouth daily.   atorvastatin 80 MG tablet Commonly known as:  LIPITOR Take 1 tablet (80 mg total) by mouth daily.   clopidogrel 75 MG tablet Commonly known as:  PLAVIX Take 1 tablet (75 mg total) by mouth daily.   docusate sodium 100 MG capsule Commonly known as:  COLACE Take 1 capsule (100 mg total) by mouth daily.   losartan 25 MG tablet Commonly known as:  COZAAR Take 1 tablet (25 mg total) by mouth daily. What changed:    medication strength  how much to take   metoprolol succinate 25 MG 24 hr tablet Commonly known as:  TOPROL-XL Take 1 tablet (25 mg total) by mouth daily. What changed:  how much to take   oxyCODONE-acetaminophen 5-325 MG tablet Commonly known as:  PERCOCET/ROXICET Take 1 tablet by mouth every 6 (six) hours as needed for moderate pain or severe pain.       Vitals:   01/15/19 0424 01/15/19 0819  BP: 108/69 119/89  Pulse: 83 81  Resp: 18 20  Temp: 98.8 F (37.1 C) 98.3 F (36.8 C)  SpO2: 100% 99%    Tele box removed and returned. Skin clean, dry and intact without evidence of skin break down, no evidence of skin tears noted. IV catheter discontinued intact. Site without signs and symptoms of complications. Dressing and pressure applied. Pt denies pain at this time. No complaints noted.  An After Visit Summary was printed and given to the patient. Patient  escorted via [place], and D/C home via private auto.  Rolley Sims

## 2019-01-15 NOTE — Progress Notes (Signed)
Progress Note  Patient Name: Heather Mahoney Date of Encounter: 01/15/2019  Primary Cardiologist: Ida Rogue, MD   Subjective   She reports having a good night, no significant shortness of breath or chest pain After discharge will stay with her sister for several days Still with some discomfort left groin area but improving, sitting in a recliner this morning Able to walk to the bathroom without significant pain, has not tried to ambulate in the hallways Blood pressure continues to run 409 up to 811 systolic (all medications have been held since PCI)   Inpatient Medications    Scheduled Meds:  aspirin EC  81 mg Oral Daily   atorvastatin  80 mg Oral Daily   clopidogrel  75 mg Oral Daily   docusate sodium  100 mg Oral Daily   losartan  25 mg Oral Daily   metoprolol succinate  25 mg Oral Daily   sodium chloride flush  3 mL Intravenous Q12H   Continuous Infusions:  PRN Meds: acetaminophen, butalbital-acetaminophen-caffeine, ondansetron (ZOFRAN) IV, oxyCODONE-acetaminophen, sodium chloride flush   Vital Signs    Vitals:   01/14/19 1900 01/14/19 2026 01/15/19 0424 01/15/19 0819  BP: 134/75 (!) 152/87 108/69 119/89  Pulse: 66 67 83 81  Resp: 17 18 18 20   Temp: 98.7 F (37.1 C) 99.2 F (37.3 C) 98.8 F (37.1 C) 98.3 F (36.8 C)  TempSrc: Oral Oral Oral Oral  SpO2: 100% 99% 100% 99%  Weight:      Height:        Intake/Output Summary (Last 24 hours) at 01/15/2019 1135 Last data filed at 01/15/2019 9147 Gross per 24 hour  Intake 240 ml  Output 275 ml  Net -35 ml   Filed Weights   01/13/19 0812 01/13/19 1700  Weight: 74.5 kg 74.4 kg    Telemetry    Normal sinus rhythm- Personally Reviewed  ECG    Physical Exam   Constitutional:  oriented to person, place, and time. No distress.  HENT:  Head: Grossly normal Eyes:  no discharge. No scleral icterus.  Neck: No JVD, no carotid bruits  Cardiovascular: Regular rate and rhythm, no murmurs  appreciated Pulmonary/Chest: Clear to auscultation bilaterally, no wheezes or rails Abdominal: Soft.  no distension.  no tenderness.  Musculoskeletal: Normal range of motion All tenderness on palpation left lower quadrant Neurological:  normal muscle tone. Coordination normal. No atrophy Skin: Skin warm and dry Psychiatric: normal affect, pleasant   Labs    Chemistry Recent Labs  Lab 01/08/19 1518 01/14/19 0627  NA 140 139  K 3.6 3.7  CL 103 105  CO2 26 24  GLUCOSE 124* 137*  BUN 11 12  CREATININE 0.66 0.67  CALCIUM 9.2 8.3*  PROT 8.0  --   ALBUMIN 4.1  --   AST 19  --   ALT 16  --   ALKPHOS 111  --   BILITOT 0.7  --   GFRNONAA >60 >60  GFRAA >60 >60  ANIONGAP 11 10     Hematology Recent Labs  Lab 01/13/19 1835 01/14/19 0627 01/14/19 1628 01/15/19 0544  WBC 14.1* 10.1  --  9.7  RBC 3.59* 3.18*  --  3.25*  HGB 9.6* 8.5* 9.9* 8.6*  HCT 29.8* 26.5*  --  26.8*  MCV 83.0 83.3  --  82.5  MCH 26.7 26.7  --  26.5  MCHC 32.2 32.1  --  32.1  RDW 15.7* 15.8*  --  15.3  PLT 377 347  --  356    Cardiac EnzymesNo results for input(s): TROPONINI in the last 168 hours. No results for input(s): TROPIPOC in the last 168 hours.   BNPNo results for input(s): BNP, PROBNP in the last 168 hours.   DDimer No results for input(s): DDIMER in the last 168 hours.   Radiology    Ct Abdomen Pelvis Wo Contrast  Result Date: 01/13/2019 CLINICAL DATA:  Bleeding from the groin following catheterization today. Hypotension. EXAM: CT ABDOMEN AND PELVIS WITHOUT CONTRAST TECHNIQUE: Multidetector CT imaging of the abdomen and pelvis was performed following the standard protocol without IV contrast. COMPARISON:  08/10/2018 FINDINGS: Lower chest: Clear lung bases.  Heart normal size. Hepatobiliary: No focal liver abnormality is seen. No gallstones, gallbladder wall thickening, or biliary dilatation. Pancreas: Unremarkable. No pancreatic ductal dilatation or surrounding inflammatory changes.  Spleen: Normal in size without focal abnormality. Adrenals/Urinary Tract: No adrenal masses. Kidneys normal in size, orientation and position. No renal masses. Residual contrast from the catheterization is noted in the intrarenal collecting systems and ureters. No hydronephrosis or hydroureter. Dense contrast fills the mildly distended bladder. No bladder abnormality. Stomach/Bowel: Stomach is unremarkable. Small bowel and colon are normal in caliber. No wall thickening or inflammation. There scattered colonic diverticula. No diverticulitis. Normal appendix visualized. Vascular/Lymphatic: Changes from previous endovascular stent graft exclusion of an abdominal aortic aneurysm. There is hemorrhage within the subcutaneous soft tissues of the left inguinal region extending from the femoral vessels. Hemorrhage extends superiorly along the ileo psoas muscle into the left lower quadrant where lies along the left pararenal fascia and lateral conal fascia. There is also stranding and increased attenuation in the subcutaneous soft tissues of the right inguinal region suggesting a recent endovascular puncture on this side as well. No enlarged lymph nodes. Reproductive: Uterus and bilateral adnexa are unremarkable. Other: No intraperitoneal hemorrhage or ascites. Musculoskeletal: No acute or significant osseous findings. IMPRESSION: 1. There is hemorrhage from the left inguinal artery puncture that lies within the left inguinal soft tissues, extending superiorly, across the left extraperitoneal pelvis to the extraperitoneal left lower abdomen and flank. 2. A small amount of apparent hemorrhage lies in the soft tissues of the right inguinal region, no evidence of right pelvic hemorrhage. 3. No intraperitoneal hemorrhage. 4. No other acute abnormality. Electronically Signed   By: Lajean Manes M.D.   On: 01/13/2019 17:22    Cardiac Studies   LHC  01/13/2019 Conclusions: 1. Severe single vessel coronary artery disease with  diffuse disease involving the RCA with focal lesions of up to 80% involving the proximal and distal vessel (see diagnostic angiography report by Dr. Rockey Situ for details). 2. Successful PCI to the proximal RCA using a Resolute Onyx 2.75 x 22 mm drug-eluting stent with 0% residual stenosis and TIMI-3 flow. 3. Successful PCI to the distal RCA using a Resolute Onyx 2.75 x 18 mm drug-eluting stent with 0% residual stenosis and TIMI-3 flow. Recommendations: 1. Overnight extended recovery. 2. Dual antiplatelet therapy with aspirin and clopidogrel for at least 12 months, ideally longer given multivessel coronary artery and peripheral vascular disease. 3. Aggressive secondary prevention. Nelva Bush, MD [place] Pager: [phone]  CT abdomen pelvis without contrast IMPRESSION: 1. There is hemorrhage from the left inguinal artery puncture that lies within the left inguinal soft tissues, extending superiorly, across the left extraperitoneal pelvis to the extraperitoneal left lower abdomen and flank. 2. A small amount of apparent hemorrhage lies in the soft tissues of the right inguinal region, no evidence of right pelvic  hemorrhage. 3. No intraperitoneal hemorrhage. 4. No other acute abnormality.   Patient Profile     53 y.o. female with a history of CAD s/p DES x2 to proximal and distal RCA, AAA, PAD, hypertension (h/o ED visits for hypertensive urgency), hyperlipidemia, GERD, PVCs, and 40-pack-year tobacco smoking history seen for catheterization with PCI 01/14/2019.  Stent x2 to the proximal and distal RCA Post catheterization hematoma, vasovagal episode, left groin discomfort  Assessment & Plan    Coronary artery disease with stable angina s/p 3/27 cardiac catheterization with PCI LHC with PCI 01/14/2019 d/t severe single-vessel CAD with successful PCI to the proximal / distal RCA on 3/27.  Continued on DAPT with aspirin and clopidogrel.  Developed LLQ pain / flank pain on  ambulation to the bathroom several hours after procedure Vasovagal episode with lightheadedness/bradycardia hypertensive -Manual compression given - CT of the abdomen and pelvis revealed hematoma, extending from the left groin up to the left flank.  Small amount of bleeding was noted in the right groin. -Given continued low heart rate blood pressure medications held Scheduled to start metoprolol succinate 25 this morning We will give losartan 20 5 in the evening if blood pressure tolerates  Anemia - Hgb 9.6  8.5. Stable, blood loss anemia  Essential hypertension, recent hypotension Blood pressure running low, will start Toprol succinate 25 this morning Losartan 25 mg in the evening if blood pressure tolerates Hold amlodipine  Mixed hyperlipidemia -Continue statin with atorvastatin 80mg  daily for LDL goal less than 70.  Will likely need to add Zetia and follow-up  PVCs  -Denies symptoms of PVCs. Continue metoprolol.   She does appreciate occasional palpitations  Tobacco use disorder -Cessation advised  PAD/AAA -Per vascular surgery Recent endograft placement  Case discussed with nursing Long discussion with her concerning discharge orders, recovery given groin hematoma/discussed restrictions Discussed medication changes, discussed cardiac rehab, follow-up office visits Discussed importance of smoking cessation, medication compliance  Total encounter time more than 45 minutes  Greater than 50% was spent in counseling and coordination of care with the patient   For questions or updates, please contact [facility] Please consult [email or web address] for contact info under        Signed, Ida Rogue, MD  01/15/2019, 11:35 AM

## 2019-01-15 NOTE — Plan of Care (Signed)
  Problem: Clinical Measurements: Goal: Ability to maintain clinical measurements within normal limits will improve Outcome: Progressing   Problem: Activity: Goal: Risk for activity intolerance will decrease Outcome: Progressing   Problem: Pain Managment: Goal: General experience of comfort will improve Outcome: Progressing   Problem: Safety: Goal: Ability to remain free from injury will improve Outcome: Progressing   Problem: Cardiovascular: Goal: Ability to achieve and maintain adequate cardiovascular perfusion will improve Outcome: Progressing Goal: Vascular access site(s) Level 0-1 will be maintained Outcome: Progressing

## 2019-01-17 ENCOUNTER — Telehealth: Payer: Self-pay | Admitting: Cardiovascular Disease

## 2019-01-17 ENCOUNTER — Ambulatory Visit: Payer: Self-pay | Admitting: Cardiovascular Disease

## 2019-01-17 NOTE — Telephone Encounter (Signed)
Pt c/o BP issue: STAT if pt c/o blurred vision, one-sided weakness or slurred speech  1. What are your last 5 BP readings? 154/94  2. Are you having any other symptoms (ex. Dizziness, headache, blurred vision, passed out)? Chest pain mild and interim  3. What is your BP issue? Patient reports elevated bo and interim cp s/p cath

## 2019-01-17 NOTE — Telephone Encounter (Signed)
Virtual Visit Pre-Appointment Phone Call  Steps For Call:  1. Confirm consent - "In the setting of the current Covid19 crisis, you are scheduled for a (phone or video) visit with your provider on (date) at (time).  Just as we do with many in-office visits, in order for you to participate in this visit, we must obtain consent.  If you'd like, I can send this to your mychart (if signed up) or email for you to review.  Otherwise, I can obtain your verbal consent now.  All virtual visits are billed to your insurance company just like a normal visit would be.  By agreeing to a virtual visit, we'd like you to understand that the technology does not allow for your provider to perform an examination, and thus may limit your provider's ability to fully assess your condition.  Finally, though the technology is pretty good, we cannot assure that it will always work on either your or our end, and in the setting of a video visit, we may have to convert it to a phone-only visit.  In either situation, we cannot ensure that we have a secure connection.  Are you willing to proceed?"  2. Give patient instructions for WebEx download to smartphone as below if video visit  3. Advise patient to be prepared with any vital sign or heart rhythm information, their current medicines, and a piece of paper and pen handy for any instructions they may receive the day of their visit  4. Inform patient they will receive a phone call 15 minutes prior to their appointment time (may be from unknown caller ID) so they should be prepared to answer  5. Confirm that appointment type is correct in Epic appointment notes (video vs telephone)    TELEPHONE CALL NOTE  Heather Mahoney has been deemed a candidate for a follow-up tele-health visit to limit community exposure during the Covid-19 pandemic. I spoke with the patient via phone to ensure availability of phone/video source, confirm preferred email & phone number, and discuss  instructions and expectations.  I reminded Heather Mahoney to be prepared with any vital sign and/or heart rhythm information that could potentially be obtained via home monitoring, at the time of her visit. I reminded Heather Mahoney to expect a phone call at the time of her visit if her visit.  Did the patient verbally acknowledge consent to treatment? YES  [name] 01/17/2019 10:38 AM   DOWNLOADING THE WEBEX SOFTWARE TO SMARTPHONE  - If Apple, go to App Store and type in WebEx in the search bar. [place] [employer], the blue/green circle. The app is free but as with any other app downloads, their phone may require them to verify saved payment information or Apple password. The patient does NOT have to create an account.  - If Android, ask patient to go to [employer] and type in WebEx in the search bar. [place] [employer], the blue/green circle. The app is free but as with any other app downloads, their phone may require them to verify saved payment information or Android password. The patient does NOT have to create an account.   CONSENT FOR TELE-HEALTH VISIT - PLEASE REVIEW  I hereby voluntarily request, consent and authorize [facility] and its employed or contracted physicians, physician assistants, nurse practitioners or other licensed health care professionals (the Practitioner), to provide me with telemedicine health care services (the Services") as deemed necessary by the treating Practitioner. I  acknowledge and consent to receive the Services by the Practitioner via telemedicine. I understand that the telemedicine visit will involve communicating with the Practitioner through live audiovisual communication technology and the disclosure of certain medical information by electronic transmission. I acknowledge that I have been given the opportunity to request an in-person assessment or other available alternative prior to the telemedicine visit and am  voluntarily participating in the telemedicine visit.  I understand that I have the right to withhold or withdraw my consent to the use of telemedicine in the course of my care at any time, without affecting my right to future care or treatment, and that the Practitioner or I may terminate the telemedicine visit at any time. I understand that I have the right to inspect all information obtained and/or recorded in the course of the telemedicine visit and may receive copies of available information for a reasonable fee.  I understand that some of the potential risks of receiving the Services via telemedicine include:   Delay or interruption in medical evaluation due to technological equipment failure or disruption;  Information transmitted may not be sufficient (e.g. poor resolution of images) to allow for appropriate medical decision making by the Practitioner; and/or   In rare instances, security protocols could fail, causing a breach of personal health information.  Furthermore, I acknowledge that it is my responsibility to provide information about my medical history, conditions and care that is complete and accurate to the best of my ability. I acknowledge that Practitioner's advice, recommendations, and/or decision may be based on factors not within their control, such as incomplete or inaccurate data provided by me or distortions of diagnostic images or specimens that may result from electronic transmissions. I understand that the practice of medicine is not an exact science and that Practitioner makes no warranties or guarantees regarding treatment outcomes. I acknowledge that I will receive a copy of this consent concurrently upon execution via email to the email address I last provided but may also request a printed copy by calling the office of [facility].    I understand that my insurance will be billed for this visit.   I have read or had this consent read to me.  I understand the  contents of this consent, which adequately explains the benefits and risks of the Services being provided via telemedicine.   I have been provided ample opportunity to ask questions regarding this consent and the Services and have had my questions answered to my satisfaction.  I give my informed consent for the services to be provided through the use of telemedicine in my medical care  By participating in this telemedicine visit I agree to the above.

## 2019-01-17 NOTE — H&P (Signed)
H&P Addendum, precardiac catheterization  Patient was seen and evaluated prior to Cardiac catheterization procedure Symptoms, prior testing details again confirmed with the patient Patient examined, no significant change from prior exam Lab work reviewed in detail personally by myself Patient understands risk and benefit of the procedure, willing to proceed  Signed, Tim Maxim Bedel, MD, Ph.D [facility]    

## 2019-01-17 NOTE — Telephone Encounter (Signed)
-----   Message from Arvil Chaco, PA-C sent at 01/14/2019  2:49 PM EDT ----- Regarding: TCM Hello,  This patient was admitted and seen at [facility] for cath with PCI done 01/13/2019.  We are expecting discharge tomorrow 01/15/2019.  Can you please call and arrange / schedule for a virtual visit with Dr. Rockey Situ in the next two weeks?  Thank you! Signed, Arvil Chaco, PA-C 01/14/2019, 2:51 PM Pager [phone]

## 2019-01-17 NOTE — Telephone Encounter (Signed)
Spoke with the pt. Pt sts that she was d/c from [facility] on 01/15/19 after having a PCI on 01/13/19. Pt sts that on the evening of 3/28 she experienced mild intermittent chest discomfort while in bed. The chest discomfort resolved on its on. She has not had any reoccurrence since Saturday night. She is currently asymptomatic. She denies chest pain, palpitations, sob, swelling, fever, cough, n/v.  She plans on purchasing a BP cuff and scale today.  She has been able to obtain her medications. She is scheduled for an e-visit with Dr.Gollan on 01/26/19. Adv her that I will fwd an FYI to Dr. Rockey Situ she is to f/u with him as planned. Adv her to contact the office sooner if cardiac symptoms develop.  Pt verbalized understanding.

## 2019-01-17 NOTE — Telephone Encounter (Signed)
lmov to schedule 2 week fu EVisit with Dr. Rockey Situ

## 2019-01-21 ENCOUNTER — Telehealth: Payer: Self-pay | Admitting: *Deleted

## 2019-01-21 NOTE — Telephone Encounter (Signed)
Patient contacted regarding discharge from [place] on 01/14/19 .  Patient understands to follow up with provider GOLLAN on 01/26/19 at 3 PM at [place]. Patient understands discharge instructions? YES Patient understands medications and regiment? YES Patient understands to bring all medications to this visit? YES   Left groin has a small knot. No pain or redness. No Bruising. About the size of a dime.  Has not changed in size.  Skin is normal color and temperature. Advised her to continue to monitor and call us if worsens or changes.  Still having some issues with bowel movement. If she walks, it does relieve the gas.  She has had one small BM. Encouraged patient to continue to drink plenty of water and try over-the-counter Miralax and gas-x to help.

## 2019-01-21 NOTE — Telephone Encounter (Signed)
-----   Message from Blain Pais sent at 01/21/2019 10:07 AM EDT ----- Regarding: toc 01/26/19 3pm Dr Rockey Situ

## 2019-01-26 ENCOUNTER — Telehealth (INDEPENDENT_AMBULATORY_CARE_PROVIDER_SITE_OTHER): Payer: Self-pay | Admitting: Cardiovascular Disease

## 2019-01-26 ENCOUNTER — Telehealth: Payer: Self-pay | Admitting: Cardiovascular Disease

## 2019-01-26 ENCOUNTER — Other Ambulatory Visit: Payer: Self-pay

## 2019-01-26 DIAGNOSIS — I10 Essential (primary) hypertension: Secondary | ICD-10-CM

## 2019-01-26 DIAGNOSIS — F17200 Nicotine dependence, unspecified, uncomplicated: Secondary | ICD-10-CM

## 2019-01-26 DIAGNOSIS — I739 Peripheral vascular disease, unspecified: Secondary | ICD-10-CM

## 2019-01-26 DIAGNOSIS — I25118 Atherosclerotic heart disease of native coronary artery with other forms of angina pectoris: Secondary | ICD-10-CM

## 2019-01-26 DIAGNOSIS — I714 Abdominal aortic aneurysm, without rupture, unspecified: Secondary | ICD-10-CM

## 2019-01-26 DIAGNOSIS — E782 Mixed hyperlipidemia: Secondary | ICD-10-CM

## 2019-01-26 DIAGNOSIS — Z720 Tobacco use: Secondary | ICD-10-CM

## 2019-01-26 MED ORDER — NITROGLYCERIN 0.4 MG SL SUBL: 0.4000 mg | SUBLINGUAL_TABLET | SUBLINGUAL | 3 refills | Status: DC | PRN

## 2019-01-26 NOTE — Patient Instructions (Addendum)
Need paperwork for [employer] (out of work) Also need go back to work paperwork for [place] back to work 02/07/2019, no restrictions   Medication Instructions:  - Your physician has recommended you make the following change in your medication:   1) Start metoprolol succinate 25 mg - take 1 tablet by mouth once daily (per the patient, she did not need this refilled now)  - Your Nitroglycerin 0.4 mg tablets have been refilled   - Monitor blood pressure - We might need to adjust losartan if pressures running high  If you need a refill on your cardiac medications before your next appointment, please call your pharmacy.   Lab work: No new labs needed   If you have labs (blood work) drawn today and your tests are completely normal, you will receive your results only by: Marland Kitchen MyChart Message (if you have MyChart) OR . A paper copy in the mail If you have any lab test that is abnormal or we need to change your treatment, we will call you to review the results.   Testing/Procedures: No new testing needed   Follow-Up: At [facility], you and your health needs are our priority.  As part of our continuing mission to provide you with exceptional heart care, we have created designated Provider Care Teams.  These Care Teams include your primary Cardiologist (physician) and Advanced Practice Providers (APPs -  Physician Assistants and Nurse Practitioners) who all work together to provide you with the care you need, when you need it.  . You will need a follow up appointment in 3 months .   Please call our office 2 months in advance to schedule this appointment. ( call the office in early May to schedule)    . Providers on your designated Care Team:   . Murray Hodgkins, NP . Christell Faith, PA-C . Marrianne Mood, PA-C  Any Other Special Instructions Will Be Listed Below (If Applicable).  For educational health videos Log in to : [email or web address] Or : [email or web address], password : [email or web address]

## 2019-01-26 NOTE — Telephone Encounter (Signed)
Secure chat message received from Dr. Rockey Mahoney after the patient's E-Visit asking if we knew where the patient's paper work was for her to RTW.   I did message nursing and our front office staff.  Heather Mahoney reached out to [facility], but no one has seen this.  I spoke with the patient. She was unsure when her son faxed this in.  She did not have the cover sheet with her- unable to confirm fax # was correct.  The patient states she was willing to bring this to the office tomorrow and leave it.  She states that [employer] is aware this may not be received tomorrow and has been understanding in light of the COVID situation.  She asks if she could call the office when she gets here and someone could come up and get it from her so she does not have to come in. I advised we could most likely work that out. I have [place] to let them know the patient will call tomorrow when she is close and they can arrange a place for drop off/ pick up of her paperwork. The patient was very appreciative of the call back.

## 2019-01-26 NOTE — Progress Notes (Addendum)
Virtual Visit via Video Note   This visit type was conducted due to national recommendations for restrictions regarding the COVID-19 Pandemic (e.g. social distancing) in an effort to limit this patient's exposure and mitigate transmission in our community.  Due to her co-morbid illnesses, this patient is at least at moderate risk for complications without adequate follow up.  This format is felt to be most appropriate for this patient at this time.  All issues noted in this document were discussed and addressed.  A limited physical exam was performed with this format.  Please refer to the patient's chart for her consent to telehealth for [facility].    Date:  01/26/2019   ID:  Heather Mahoney, Heather Mahoney 07-17-1966, MRN [id number]  Patient Location:  [address] [place] [address]   Provider location:   [facility], [place] office  PCP:  Patient, No Pcp Per  Cardiologist:  Arvid Right [facility]  Chief Complaint: Mild left chest tightness, anxiety    History of Present Illness:    Heather Mahoney is a 53 y.o. female who presents via Engineer, civil (consulting) for a telehealth visit today.   The patient does not symptoms concerning for COVID-19 infection (fever, chills, cough, or new SHORTNESS OF BREATH).   Patient has a past medical history of Smoker GERD HTN PVCs Fall and question of loss of consciousness 2017, secondary to orthostasis,  AAA with endovascular repair October 2019, access site on the right groin CAD, stent x2 to RCA March 2020, Access site left groin Who presents today for follow-up of her chest pain/angina, shortness of breath, CAD, results of her recent cardiac CT  Given functional study/cardiac CTA showing stenosis in RCA by FFR and given her symptoms concerning for unstable angina,  She underwent cardiac catheterization January 13, 2019  Cardiac cath 01/13/2019 Single-vessel disease notably of the RCA moderate to severe proximal  disease  estimated 75% and distal RCA disease estimated at 80% There is disease of high OM vessel, small in caliber diffusely diseased Mild proximal LAD disease  She had stenting of the proximal RCA and distal RCA Medical management for disease on the left system  Postprocedure had failure of the closure device with subsequent left groin hematoma causing hypotension, vagal response Was kept for 2 days post procedure in the hospital, pain control, hypotension  Discharged on dramatically reduced blood pressure medication Losartan down to 25 mg daily and we had recommended metoprolol succinate 25 daily She reports that she has not started the metoprolol yet  She is ambulating well now, anxious to continue her recovery  Blood pressure typically running 453 or higher systolic, today running 646 for unclear reasons Home  Weight going up, eating more  Discussed medications for blood pressure, She does not want isosorbide as this caused a headache   peripheral vascular catheterization in October 2019  AAA repair, endograft  She reports strong family history of coronary disease Mother with bypass surgery Her grandmother, mother side, passed away at the age of 38 from a brain aneurysm.    Prior CV studies:   The following studies were reviewed today:  Cardiac CTA cardiac CT scan was ordered showing severe stenosis mid RCA (FFR less than 0.7), indeterminate stenosis LAD and circumflex (FFR less than 0.8)   Past Medical History:  Diagnosis Date  . CAD (coronary artery disease)    a. PCI to proximal and distal RCA 3/26  . GERD (gastroesophageal reflux disease)   .  Heart murmur    MR  . Hiatal hernia   . Hyperlipidemia   . Hypertension   . Tobacco abuse    a. Started at age 9, quit during 3 pregnancies. b. 1 PPD for a 40 pack-year hx (2019)    Past Surgical History:  Procedure Laterality Date  . BREAST SURGERY  1986   I&D for 'milk duct'  . CORONARY STENT INTERVENTION N/A 01/13/2019    Procedure: CORONARY STENT INTERVENTION;  Surgeon: Nelva Bush, MD;  Location: [place] [facility];  Service: Cardiovascular;  Laterality: N/A;  . EMBOLECTOMY  08/13/2018   Procedure: Right SFA and profunda femoris Fogarty embolectomy 3  Fogarty embolectomy balloon;  Surgeon: Algernon Huxley, MD;  Location: [facility] ORS;  Service: Vascular;;  . ENDARTERECTOMY FEMORAL Right 08/13/2018   Procedure: Right common femoral, profunda femoris, and superficial femoral artery endarterectomies and patch angioplasty ;  Surgeon: Algernon Huxley, MD;  Location: [facility] ORS;  Service: Vascular;  Laterality: Right;  . ENDOVASCULAR REPAIR/STENT GRAFT N/A 08/13/2018   Procedure: ENDOVASCULAR REPAIR/STENT GRAFT;  Surgeon: Katha Cabal, MD;  Location: [place] [facility];  Service: Cardiovascular;  Laterality: N/A;  . LEFT HEART CATH AND CORONARY ANGIOGRAPHY Left 01/13/2019   Procedure: LEFT HEART CATH AND CORONARY ANGIOGRAPHY;  Surgeon: Minna Merritts, MD;  Location: [place] [facility];  Service: Cardiovascular;  Laterality: Left;  . SALPINGOOPHORECTOMY Left 2000     No outpatient medications have been marked as taking for the 01/26/19 encounter (Appointment) with Minna Merritts, MD.     Allergies:   Strawberry extract and Strawberry flavor   Social History   Tobacco Use  . Smoking status: Former Smoker    Packs/day: 0.25    Years: 38.00    Pack years: 9.50    Types: Cigarettes    Last attempt to quit: 08/10/2018    Years since quitting: 0.4  . Smokeless tobacco: Never Used  Substance Use Topics  . Alcohol use: Yes    Comment: occassional  . Drug use: Yes    Types: Marijuana    Comment: smokes Marijuana 'when I cant sleep, maybe once or twice a week'     Current Outpatient Medications on File Prior to Visit  Medication Sig Dispense Refill  . aspirin 81 MG EC tablet Take 1 tablet (81 mg total) by mouth daily. 90 tablet 3  . atorvastatin (LIPITOR) 80 MG tablet Take 1 tablet (80 mg total) by  mouth daily. 90 tablet 3  . clopidogrel (PLAVIX) 75 MG tablet Take 1 tablet (75 mg total) by mouth daily. 30 tablet 12  . docusate sodium (COLACE) 100 MG capsule Take 1 capsule (100 mg total) by mouth daily. 10 capsule 0  . losartan (COZAAR) 25 MG tablet Take 1 tablet (25 mg total) by mouth daily. 90 tablet 3  . metoprolol succinate (TOPROL-XL) 25 MG 24 hr tablet Take 1 tablet (25 mg total) by mouth daily. 90 tablet 3  . oxyCODONE-acetaminophen (PERCOCET/ROXICET) 5-325 MG tablet Take 1 tablet by mouth every 6 (six) hours as needed for moderate pain or severe pain. 12 tablet 0   No current facility-administered medications on file prior to visit.      Family Hx: The patient's family history includes Asthma in her mother; Diabetes in her maternal grandmother and mother; Heart attack in her mother; Heart disease in her maternal grandmother and mother; Hyperlipidemia in her maternal grandmother and mother; Hypertension in her mother; Sarcoidosis in her mother.  ROS:  Please see the history of present illness.    Review of Systems  Constitutional: Positive for malaise/fatigue.  Respiratory: Negative.   Cardiovascular: Positive for chest pain.  Gastrointestinal: Negative.   Musculoskeletal: Negative.   Neurological: Negative.   Psychiatric/Behavioral: Negative.   All other systems reviewed and are negative.     Labs/Other Tests and Data Reviewed:    Recent Labs: 08/15/2018: Magnesium 2.0 12/06/2018: TSH 3.010 01/08/2019: ALT 16 01/14/2019: BUN 12; Creatinine, Ser 0.67; Potassium 3.7; Sodium 139 01/15/2019: Hemoglobin 8.6; Platelets 356   Recent Lipid Panel Lab Results  Component Value Date/Time   CHOL 266 (H) 12/06/2018 10:17 AM   TRIG 119 12/06/2018 10:17 AM   HDL 64 12/06/2018 10:17 AM   CHOLHDL 4.2 12/06/2018 10:17 AM   LDLCALC 178 (H) 12/06/2018 10:17 AM   LDLDIRECT 185 (H) 12/06/2018 10:17 AM    Wt Readings from Last 3 Encounters:  01/13/19 164 lb 0.4 oz (74.4 kg)   01/08/19 164 lb (74.4 kg)  01/07/19 164 lb (74.4 kg)     Exam:    Vital Signs: Vital signs as detailed above in HPI  Well nourished, well developed female in no acute distress. Constitutional:  oriented to person, place, and time. No distress.  Head: Normocephalic and atraumatic.  Eyes:  no discharge. No scleral icterus.  Neck: Normal range of motion. Neck supple.  Pulmonary/Chest: No audible wheezing, no distress, appears comfortable Musculoskeletal: Normal range of motion.  no  tenderness or deformity.  Neurological:   Coordination normal. Full exam not performed Skin:  No rash Psychiatric:  normal mood and affect. behavior is normal. Thought content normal.    ASSESSMENT & PLAN:    Coronary artery disease of native artery of native heart with stable angina pectoris (HCC) Recent stent placement x2, post procedure left groin hematoma Slow recovery She has started a low-grade walking program as cardiac rehab is closed for viral outbreak Return to work [employer] with no restrictions  PAD (peripheral artery disease) ([place]) AAA with endograft placement 6 months ago  Abdominal aortic aneurysm (AAA) without rupture ([place]) Monitored by Dr. Lucky Cowboy  Essential hypertension, benign Had hypotension in the setting of left groin hematoma following cardiac catheterization Now blood pressure higher Recommend she restart metoprolol succinate 25 mg daily We will need to increase losartan most likely up to 25 twice daily or 50 daily Was previously on losartan 100 daily with metoprolol succinate 75 daily She will continue to hold amlodipine and take only as needed for high blood pressure Additional modifications will likely be needed  Mixed hyperlipidemia Stressed importance of taking Lipitor 80 daily Goal LDL less than 70 May need to add Zetia  Tobacco use disorder We have encouraged her to continue to work on weaning her cigarettes and smoking cessation. She will continue to work on this  and does not want any assistance with chantix.   COVID-19 Education: The signs and symptoms of COVID-19 were discussed with the patient and how to seek care for testing (follow up with PCP or arrange E-visit).  The importance of social distancing was discussed today.  Patient Risk:   After full review of this patients clinical status, I feel that they are at least moderate risk at this time.  Time:   Today, I have spent 25 minutes with the patient with telehealth technology discussing recent cardiac catheterization, stable angina symptoms, left chest discomfort, management of lipids and blood pressure medication changes.     Medication Adjustments/Labs and Tests Ordered: Current  medicines are reviewed at length with the patient today.  Concerns regarding medicines are outlined above.   Tests Ordered: No tests ordered   Medication Changes: No changes made   Disposition: Follow-up in 6 months   Signed, Ida Rogue, MD  01/26/2019 3:16 PM    [facility] [address], [place], [place] [address]

## 2019-01-31 NOTE — Telephone Encounter (Signed)
Patient has not brought forms at this time. Closing encounter.

## 2019-01-31 NOTE — Telephone Encounter (Signed)
I never heard anything from the patient

## 2019-02-01 ENCOUNTER — Telehealth: Payer: Self-pay | Admitting: Cardiovascular Disease

## 2019-02-01 NOTE — Telephone Encounter (Signed)
Patient came in to pick up today.

## 2019-02-01 NOTE — Telephone Encounter (Signed)
Please call regarding pt out of work paperwork.

## 2019-02-01 NOTE — Telephone Encounter (Signed)
Spoke with patient and reviewed that all paperwork has been completed and faxed by provider to number on forms. Advised that when she comes in to just ask for me and I will be happy to bring in those papers. She was appreciative for the call with no further questions at this time. She is coming over to pick those up and let her know to ask for me. Updated her on our entrance change due to [place] and she verbalized understanding with no other concerns.

## 2019-02-23 ENCOUNTER — Ambulatory Visit (INDEPENDENT_AMBULATORY_CARE_PROVIDER_SITE_OTHER): Payer: Self-pay

## 2019-02-23 ENCOUNTER — Other Ambulatory Visit: Payer: Self-pay

## 2019-02-23 DIAGNOSIS — R0609 Other forms of dyspnea: Secondary | ICD-10-CM

## 2019-02-23 DIAGNOSIS — R0600 Dyspnea, unspecified: Secondary | ICD-10-CM

## 2019-02-23 DIAGNOSIS — I493 Ventricular premature depolarization: Secondary | ICD-10-CM

## 2019-02-24 ENCOUNTER — Encounter: Payer: Self-pay | Admitting: Physician Assistant

## 2019-03-21 ENCOUNTER — Ambulatory Visit: Payer: Medicaid Other | Admitting: Cardiovascular Disease

## 2019-03-29 ENCOUNTER — Other Ambulatory Visit (INDEPENDENT_AMBULATORY_CARE_PROVIDER_SITE_OTHER): Payer: Self-pay | Admitting: Vascular Surgery

## 2019-03-29 DIAGNOSIS — I998 Other disorder of circulatory system: Secondary | ICD-10-CM

## 2019-03-29 DIAGNOSIS — Z9889 Other specified postprocedural states: Secondary | ICD-10-CM

## 2019-04-01 ENCOUNTER — Encounter (INDEPENDENT_AMBULATORY_CARE_PROVIDER_SITE_OTHER): Payer: Self-pay | Admitting: Vascular Surgery

## 2019-04-01 ENCOUNTER — Ambulatory Visit (INDEPENDENT_AMBULATORY_CARE_PROVIDER_SITE_OTHER): Payer: Self-pay

## 2019-04-01 ENCOUNTER — Ambulatory Visit (INDEPENDENT_AMBULATORY_CARE_PROVIDER_SITE_OTHER): Payer: Medicaid Other

## 2019-04-01 ENCOUNTER — Other Ambulatory Visit: Payer: Self-pay

## 2019-04-01 ENCOUNTER — Ambulatory Visit (INDEPENDENT_AMBULATORY_CARE_PROVIDER_SITE_OTHER): Payer: Self-pay | Admitting: Vascular Surgery

## 2019-04-01 VITALS — BP 204/124 | HR 65 | Resp 12 | Ht 66.0 in | Wt 170.0 lb

## 2019-04-01 DIAGNOSIS — I10 Essential (primary) hypertension: Secondary | ICD-10-CM

## 2019-04-01 DIAGNOSIS — Z9889 Other specified postprocedural states: Secondary | ICD-10-CM

## 2019-04-01 DIAGNOSIS — E782 Mixed hyperlipidemia: Secondary | ICD-10-CM

## 2019-04-01 DIAGNOSIS — Z87891 Personal history of nicotine dependence: Secondary | ICD-10-CM

## 2019-04-01 DIAGNOSIS — I714 Abdominal aortic aneurysm, without rupture, unspecified: Secondary | ICD-10-CM

## 2019-04-01 DIAGNOSIS — I998 Other disorder of circulatory system: Secondary | ICD-10-CM

## 2019-04-01 DIAGNOSIS — I70219 Atherosclerosis of native arteries of extremities with intermittent claudication, unspecified extremity: Secondary | ICD-10-CM | POA: Insufficient documentation

## 2019-04-01 DIAGNOSIS — I70213 Atherosclerosis of native arteries of extremities with intermittent claudication, bilateral legs: Secondary | ICD-10-CM

## 2019-04-01 DIAGNOSIS — Z79899 Other long term (current) drug therapy: Secondary | ICD-10-CM

## 2019-04-01 DIAGNOSIS — I70201 Unspecified atherosclerosis of native arteries of extremities, right leg: Secondary | ICD-10-CM

## 2019-04-01 DIAGNOSIS — I739 Peripheral vascular disease, unspecified: Secondary | ICD-10-CM

## 2019-04-01 NOTE — Assessment & Plan Note (Signed)
ABIs today were done at rest and are mildly reduced at 0.90 on the right and 0.92 on the left although her waveforms on the right are monophasic and on the left they are biphasic in the anterior tibial and monophasic in the posterior tibial.  Duplex was done on the right leg as she had a thrombectomy on the right femoral artery.  That area is widely patent.  She does have what appears to be a hemodynamically significant stenosis in the distal right SFA. We had a good discussion today regarding the pathophysiology and natural history of peripheral arterial disease.  We discussed that she has no limb threatening symptoms but her symptoms certainly sound like they may be lifestyle limiting.  We discussed that her endovascular stent graft does pose some difficulties in terms of revascularization and making the typical up and over endovascular intervention difficult or impossible.  We discussed radial access, antegrade access, or pedal access as options and honestly I would favor the pedal access route.  She would like to consider these options, but at current she would prefer to continue trying to walk, exercise, and build up collaterals.  We will shorten her follow-up interval and I will plan to see her back in about 3 months and we can then again discuss options.  She voices her understanding.

## 2019-04-01 NOTE — Progress Notes (Signed)
MRN : [id number]  Heather Mahoney is a 53 y.o. ([birth year]) female who presents with chief complaint of  Chief Complaint  Patient presents with  . Follow-up  .  History of Present Illness: Patient returns today in follow up of multiple vascular issues.  Her biggest complaint currently is of claudication symptoms in both calves with walking.  This is of moderate to severe intensity.  It does impact activities of daily living particularly at work.  She denies rest pain.  No ulceration or infection.  ABIs today were done at rest and are mildly reduced at 0.90 on the right and 0.92 on the left although her waveforms on the right are monophasic and on the left they are biphasic in the anterior tibial and monophasic in the posterior tibial.  Duplex was done on the right leg as she had a thrombectomy on the right femoral artery.  That area is widely patent.  She does have what appears to be a hemodynamically significant stenosis in the distal right SFA. She does not have any current aneurysm complaints.  She is coming up on 1 year status post endovascular aneurysm repair and right femoral thrombectomy. Duplex shows a patent stent graft with no evidence of endoleak.  Her aortic diameter has basically shrunken down to the size of the stent graft.   Current Outpatient Medications  Medication Sig Dispense Refill  . aspirin 81 MG EC tablet Take 1 tablet (81 mg total) by mouth daily. 90 tablet 3  . atorvastatin (LIPITOR) 80 MG tablet Take 1 tablet (80 mg total) by mouth daily. 90 tablet 3  . clopidogrel (PLAVIX) 75 MG tablet Take 1 tablet (75 mg total) by mouth daily. 30 tablet 12  . docusate sodium (COLACE) 100 MG capsule Take 1 capsule (100 mg total) by mouth daily. 10 capsule 0  . losartan (COZAAR) 25 MG tablet Take 1 tablet (25 mg total) by mouth daily. (Patient taking differently: Take 100 mg by mouth daily. ) 90 tablet 3  . metoprolol succinate (TOPROL-XL) 25 MG 24 hr tablet Take 1 tablet (25 mg total)  by mouth daily. 90 tablet 3  . nitroGLYCERIN (NITROSTAT) 0.4 MG SL tablet Place 1 tablet (0.4 mg total) under the tongue every 5 (five) minutes as needed for chest pain. 25 tablet 3  . oxyCODONE-acetaminophen (PERCOCET/ROXICET) 5-325 MG tablet Take 1 tablet by mouth every 6 (six) hours as needed for moderate pain or severe pain. (Patient not taking: Reported on 04/01/2019) 12 tablet 0   No current facility-administered medications for this visit.     Past Medical History:  Diagnosis Date  . CAD (coronary artery disease)    a. PCI to proximal and distal RCA 3/26  . GERD (gastroesophageal reflux disease)   . Heart murmur    MR  . Hiatal hernia   . Hyperlipidemia   . Hypertension   . Tobacco abuse    a. Started at age 67, quit during 3 pregnancies. b. 1 PPD for a 40 pack-year hx (2019)     Past Surgical History:  Procedure Laterality Date  . BREAST SURGERY  1986   I&D for 'milk duct'  . CORONARY STENT INTERVENTION N/A 01/13/2019   Procedure: CORONARY STENT INTERVENTION;  Surgeon: Nelva Bush, MD;  Location: [facility];  Service: Cardiovascular;  Laterality: N/A;  . EMBOLECTOMY  08/13/2018   Procedure: Right SFA and profunda femoris Fogarty embolectomy 3  Fogarty embolectomy balloon;  Surgeon: Algernon Huxley, MD;  Location:  [facility];  Service: Vascular;;  . ENDARTERECTOMY FEMORAL Right 08/13/2018   Procedure: Right common femoral, profunda femoris, and superficial femoral artery endarterectomies and patch angioplasty ;  Surgeon: Algernon Huxley, MD;  Location: [facility];  Service: Vascular;  Laterality: Right;  . ENDOVASCULAR REPAIR/STENT GRAFT N/A 08/13/2018   Procedure: ENDOVASCULAR REPAIR/STENT GRAFT;  Surgeon: Katha Cabal, MD;  Location: [facility];  Service: Cardiovascular;  Laterality: N/A;  . LEFT HEART CATH AND CORONARY ANGIOGRAPHY Left 01/13/2019   Procedure: LEFT HEART CATH AND CORONARY ANGIOGRAPHY;  Surgeon: Minna Merritts, MD;  Location: [facility];  Service: Cardiovascular;  Laterality: Left;  . SALPINGOOPHORECTOMY Left 2000    Social History Social History   Tobacco Use  . Smoking status: Former Smoker    Packs/day: 0.25    Years: 38.00    Pack years: 9.50    Types: Cigarettes    Quit date: 08/10/2018    Years since quitting: 0.6  . Smokeless tobacco: Never Used  Substance Use Topics  . Alcohol use: Yes    Comment: occassional  . Drug use: Yes    Types: Marijuana    Comment: smokes Marijuana 'when I cant sleep, maybe once or twice a week'     Family History Family History  Problem Relation Age of Onset  . Diabetes Mother   . Hypertension Mother   . Hyperlipidemia Mother   . Heart disease Mother        CABG X 4  . Asthma Mother   . Sarcoidosis Mother        In remission  . Heart attack Mother   . Diabetes Maternal Grandmother   . Heart disease Maternal Grandmother   . Hyperlipidemia Maternal Grandmother     Allergies  Allergen Reactions  . Strawberry Extract Hives and Swelling  . Strawberry Flavor Rash     REVIEW OF SYSTEMS (Negative unless checked)  Constitutional: [] Weight loss  [] Fever  [] Chills Cardiac: [] Chest pain   [] Chest pressure   [] Palpitations   [] Shortness of breath when laying flat   [] Shortness of breath at rest   [] Shortness of breath with exertion. Vascular:  [] Pain in legs with walking   [] Pain in legs at rest   [] Pain in legs when laying flat   [] Claudication   [] Pain in feet when walking  [] Pain in feet at rest  [] Pain in feet when laying flat   [] History of DVT   [] Phlebitis   [] Swelling in legs   [] Varicose veins   [] Non-healing ulcers Pulmonary:   [] Uses home oxygen   [] Productive cough   [] Hemoptysis   [] Wheeze  [] COPD   [] Asthma Neurologic:  [] Dizziness  [] Blackouts   [] Seizures   [] History of stroke   [] History of TIA  [] Aphasia   [] Temporary blindness   [] Dysphagia   [] Weakness or numbness in arms   [] Weakness or numbness in legs Musculoskeletal:  [] Arthritis    [] Joint swelling   [] Joint pain   [] Low back pain Hematologic:  [] Easy bruising  [] Easy bleeding   [] Hypercoagulable state   [] Anemic   Gastrointestinal:  [] Blood in stool   [] Vomiting blood  [] Gastroesophageal reflux/heartburn   [] Abdominal pain Genitourinary:  [] Chronic kidney disease   [] Difficult urination  [] Frequent urination  [] Burning with urination   [] Hematuria Skin:  [] Rashes   [] Ulcers   [] Wounds Psychological:  [] History of anxiety   []  History of major depression.  Physical Examination  BP (!) 204/124 (BP Location: Left Arm, Patient  Position: Sitting, Cuff Size: Normal)   Pulse 65   Resp 12   Ht 5\' 6"  (1.676 m)   Wt 170 lb (77.1 kg)   BMI 27.44 kg/m  Gen:  WD/WN, NAD Head: [place]/AT, No temporalis wasting. Ear/Nose/Throat: Hearing grossly intact, nares w/o erythema or drainage Eyes: Conjunctiva clear. Sclera non-icteric Neck: Supple.  Trachea midline Pulmonary:  Good air movement, no use of accessory muscles.  Cardiac: RRR, no JVD Vascular:  Vessel Right Left  Radial Palpable Palpable                          PT 1+ Palpable 1+ Palpable  DP 1+ Palpable Palpable   Gastrointestinal: soft, non-tender/non-distended. No guarding/reflex.  Musculoskeletal: M/S 5/5 throughout.  No deformity or atrophy. No significant edema. Neurologic: Sensation grossly intact in extremities.  Symmetrical.  Speech is fluent.  Psychiatric: Judgment intact, Mood & affect appropriate for pt's clinical situation. Dermatologic: No rashes or ulcers noted.  No cellulitis or open wounds.       Labs Recent Results (from the past 2160 hour(s))  CBC with Differential/Platelet     Status: Abnormal   Collection Time: 01/07/19 11:53 AM  Result Value Ref Range   WBC 9.9 3.4 - 10.8 x10E3/uL   RBC 4.22 3.77 - 5.28 x10E6/uL   Hemoglobin 11.2 11.1 - 15.9 g/dL   Hematocrit 35.6 34.0 - 46.6 %   MCV 84 79 - 97 fL   MCH 26.5 (L) 26.6 - 33.0 pg   MCHC 31.5 31.5 - 35.7 g/dL   RDW 15.5 (H) 11.7 -  15.4 %   Platelets 476 (H) 150 - 450 x10E3/uL   Neutrophils 57 Not Estab. %   Lymphs 31 Not Estab. %   Monocytes 8 Not Estab. %   Eos 3 Not Estab. %   Basos 1 Not Estab. %   Neutrophils Absolute 5.6 1.4 - 7.0 x10E3/uL   Lymphocytes Absolute 3.1 0.7 - 3.1 x10E3/uL   Monocytes Absolute 0.8 0.1 - 0.9 x10E3/uL   EOS (ABSOLUTE) 0.3 0.0 - 0.4 x10E3/uL   Basophils Absolute 0.1 0.0 - 0.2 x10E3/uL   Immature Granulocytes 0 Not Estab. %   Immature Grans (Abs) 0.0 0.0 - 0.1 [id number]  Basic metabolic panel     Status: Abnormal   Collection Time: 01/07/19 11:53 AM  Result Value Ref Range   Glucose 109 (H) 65 - 99 mg/dL   BUN 10 6 - 24 mg/dL   Creatinine, Ser 0.90 0.57 - 1.00 mg/dL   GFR calc non Af Amer 73 >59 mL/min/1.73   GFR calc Af Amer 84 >59 mL/min/1.73   BUN/Creatinine Ratio 11 9 - 23   Sodium 143 134 - 144 mmol/L   Potassium 4.5 3.5 - 5.2 mmol/L   Chloride 101 96 - 106 mmol/L   CO2 25 20 - 29 mmol/L   Calcium 9.5 8.7 - 10.2 mg/dL  CBC with Differential/Platelet     Status: Abnormal   Collection Time: 01/08/19  3:18 PM  Result Value Ref Range   WBC 11.1 (H) 4.0 - 10.5 K/uL   RBC 4.28 3.87 - 5.11 MIL/uL   Hemoglobin 11.3 (L) 12.0 - 15.0 g/dL   HCT 35.6 (L) 36.0 - 46.0 %   MCV 83.2 80.0 - 100.0 fL   MCH 26.4 26.0 - 34.0 pg   MCHC 31.7 30.0 - 36.0 g/dL   RDW 15.6 (H) 11.5 - 15.5 %   Platelets 447 (H) 150 - 400  K/uL   nRBC 0.0 0.0 - 0.2 %   Neutrophils Relative % 60 %   Neutro Abs 6.7 1.7 - 7.7 K/uL   Lymphocytes Relative 28 %   Lymphs Abs 3.1 0.7 - 4.0 K/uL   Monocytes Relative 9 %   Monocytes Absolute 1.0 0.1 - 1.0 K/uL   Eosinophils Relative 3 %   Eosinophils Absolute 0.4 0.0 - 0.5 K/uL   Basophils Relative 0 %   Basophils Absolute 0.1 0.0 - 0.1 K/uL   Immature Granulocytes 0 %   Abs Immature Granulocytes 0.03 0.00 - 0.07 K/uL    Comment: Performed at [facility], [address] [place], [place] [address]  Comprehensive metabolic panel     Status: Abnormal    Collection Time: 01/08/19  3:18 PM  Result Value Ref Range   Sodium 140 135 - 145 mmol/L   Potassium 3.6 3.5 - 5.1 mmol/L   Chloride 103 98 - 111 mmol/L   CO2 26 22 - 32 mmol/L   Glucose, Bld 124 (H) 70 - 99 mg/dL   BUN 11 6 - 20 mg/dL   Creatinine, Ser 0.66 0.44 - 1.00 mg/dL   Calcium 9.2 8.9 - 10.3 mg/dL   Total Protein 8.0 6.5 - 8.1 g/dL   Albumin 4.1 3.5 - 5.0 g/dL   AST 19 15 - 41 U/L   ALT 16 0 - 44 U/L   Alkaline Phosphatase 111 38 - 126 U/L   Total Bilirubin 0.7 0.3 - 1.2 mg/dL   GFR calc non Af Amer >60 >60 mL/min   GFR calc Af Amer >60 >60 mL/min   Anion gap 11 5 - 15    Comment: Performed at [facility], [address], [place], [place] [address]  POCT Activated clotting time     Status: None   Collection Time: 01/13/19 12:07 PM  Result Value Ref Range   Activated Clotting Time 296 seconds  POCT Activated clotting time     Status: None   Collection Time: 01/13/19 12:37 PM  Result Value Ref Range   Activated Clotting Time 296 seconds  POCT Activated clotting time     Status: None   Collection Time: 01/13/19 12:57 PM  Result Value Ref Range   Activated Clotting Time 274 seconds  Glucose, capillary     Status: Abnormal   Collection Time: 01/13/19  4:06 PM  Result Value Ref Range   Glucose-Capillary 121 (H) 70 - 99 mg/dL  Glucose, capillary     Status: Abnormal   Collection Time: 01/13/19  5:17 PM  Result Value Ref Range   Glucose-Capillary 103 (H) 70 - 99 mg/dL  MRSA PCR Screening     Status: None   Collection Time: 01/13/19  5:51 PM   Specimen: Nasal Mucosa; Nasopharyngeal  Result Value Ref Range   MRSA by PCR NEGATIVE NEGATIVE    Comment:        The GeneXpert MRSA Assay (FDA approved for NASAL specimens only), is one component of a comprehensive MRSA colonization surveillance program. It is not intended to diagnose MRSA infection nor to guide or monitor treatment for MRSA infections. Performed at [facility], [address] [place], [place] [address]   CBC with Differential/Platelet     Status: Abnormal   Collection Time: 01/13/19  6:35 PM  Result Value Ref Range   WBC 14.1 (H) 4.0 - 10.5 K/uL   RBC 3.59 (L) 3.87 - 5.11 MIL/uL   Hemoglobin 9.6 (L) 12.0 -  15.0 g/dL   HCT 29.8 (L) 36.0 - 46.0 %   MCV 83.0 80.0 - 100.0 fL   MCH 26.7 26.0 - 34.0 pg   MCHC 32.2 30.0 - 36.0 g/dL   RDW 15.7 (H) 11.5 - 15.5 %   Platelets 377 150 - 400 K/uL   nRBC 0.0 0.0 - 0.2 %   Neutrophils Relative % 75 %   Neutro Abs 10.5 (H) 1.7 - 7.7 K/uL   Lymphocytes Relative 18 %   Lymphs Abs 2.5 0.7 - 4.0 K/uL   Monocytes Relative 6 %   Monocytes Absolute 0.9 0.1 - 1.0 K/uL   Eosinophils Relative 1 %   Eosinophils Absolute 0.2 0.0 - 0.5 K/uL   Basophils Relative 0 %   Basophils Absolute 0.0 0.0 - 0.1 K/uL   Immature Granulocytes 0 %   Abs Immature Granulocytes 0.05 0.00 - 0.07 K/uL    Comment: Performed at [facility], [address] [place], [place] [address]  Basic metabolic panel     Status: Abnormal   Collection Time: 01/14/19  6:27 AM  Result Value Ref Range   Sodium 139 135 - 145 mmol/L   Potassium 3.7 3.5 - 5.1 mmol/L   Chloride 105 98 - 111 mmol/L   CO2 24 22 - 32 mmol/L   Glucose, Bld 137 (H) 70 - 99 mg/dL   BUN 12 6 - 20 mg/dL   Creatinine, Ser 0.67 0.44 - 1.00 mg/dL   Calcium 8.3 (L) 8.9 - 10.3 mg/dL   GFR calc non Af Amer >60 >60 mL/min   GFR calc Af Amer >60 >60 mL/min   Anion gap 10 5 - 15    Comment: Performed at [facility], [address] [place], [place] [address]  CBC     Status: Abnormal   Collection Time: 01/14/19  6:27 AM  Result Value Ref Range   WBC 10.1 4.0 - 10.5 K/uL   RBC 3.18 (L) 3.87 - 5.11 MIL/uL   Hemoglobin 8.5 (L) 12.0 - 15.0 g/dL   HCT 26.5 (L) 36.0 - 46.0 %   MCV 83.3 80.0 - 100.0 fL   MCH 26.7 26.0 - 34.0 pg   MCHC 32.1 30.0 - 36.0 g/dL   RDW 15.8 (H) 11.5 - 15.5 %   Platelets 347 150 - 400 K/uL   nRBC 0.0 0.0 - 0.2 %    Comment: Performed at [facility], [address] [place], [place] [address]  Hemoglobin     Status: Abnormal   Collection Time: 01/14/19  4:28 PM  Result Value Ref Range   Hemoglobin 9.9 (L) 12.0 - 15.0 g/dL    Comment: Performed at [facility], [address] [place], [place] [address]  CBC     Status: Abnormal   Collection Time: 01/15/19  5:44 AM  Result Value Ref Range   WBC 9.7 4.0 - 10.5 K/uL   RBC 3.25 (L) 3.87 - 5.11 MIL/uL   Hemoglobin 8.6 (L) 12.0 - 15.0 g/dL   HCT 26.8 (L) 36.0 - 46.0 %   MCV 82.5 80.0 - 100.0 fL   MCH 26.5 26.0 - 34.0 pg   MCHC 32.1 30.0 - 36.0 g/dL   RDW 15.3 11.5 - 15.5 %   Platelets 356 150 - 400 K/uL   nRBC 0.0 0.0 - 0.2 %    Comment: Performed at [facility], [address] [place],  [address]    Radiology No results found.  Assessment/Plan  Hypertension blood pressure control important in reducing the progression of atherosclerotic disease. On appropriate oral medications.   Mixed hyperlipidemia lipid control important in reducing the progression of atherosclerotic disease. Continue statin therapy   AAA (abdominal aortic aneurysm) (HCC) Duplex shows a patent stent graft with no evidence of endoleak.  Her aortic diameter has basically shrunken down to the size of the stent graft.  This can now be checked on an annual basis.  Atherosclerosis of native arteries of extremity with intermittent claudication (HCC) ABIs today were done at rest and are mildly reduced at 0.90 on the right and 0.92 on the left although her waveforms on the right are monophasic and on the left they are biphasic in the anterior tibial and monophasic in the posterior tibial.  Duplex was done on the right leg as she had a thrombectomy on the right femoral artery.  That area is widely patent.  She does have what appears to be a hemodynamically significant stenosis in the distal right SFA. We had a good discussion today regarding the pathophysiology and natural  history of peripheral arterial disease.  We discussed that she has no limb threatening symptoms but her symptoms certainly sound like they may be lifestyle limiting.  We discussed that her endovascular stent graft does pose some difficulties in terms of revascularization and making the typical up and over endovascular intervention difficult or impossible.  We discussed radial access, antegrade access, or pedal access as options and honestly I would favor the pedal access route.  She would like to consider these options, but at current she would prefer to continue trying to walk, exercise, and build up collaterals.  We will shorten her follow-up interval and I will plan to see her back in about 3 months and we can then again discuss options.  She voices her understanding.    [name], MD  04/01/2019 11:01 AM    This note was created with Dragon medical transcription system.  Any errors from dictation are purely unintentional

## 2019-04-01 NOTE — Assessment & Plan Note (Signed)
lipid control important in reducing the progression of atherosclerotic disease. Continue statin therapy  

## 2019-04-01 NOTE — Assessment & Plan Note (Signed)
blood pressure control important in reducing the progression of atherosclerotic disease. On appropriate oral medications.  

## 2019-04-01 NOTE — Assessment & Plan Note (Signed)
Duplex shows a patent stent graft with no evidence of endoleak.  Her aortic diameter has basically shrunken down to the size of the stent graft.  This can now be checked on an annual basis.

## 2019-04-09 ENCOUNTER — Encounter: Payer: Self-pay | Admitting: Emergency Medicine

## 2019-04-09 ENCOUNTER — Other Ambulatory Visit: Payer: Self-pay

## 2019-04-09 ENCOUNTER — Emergency Department: Payer: Medicaid Other

## 2019-04-09 ENCOUNTER — Observation Stay
Admission: EM | Admit: 2019-04-09 | Discharge: 2019-04-11 | Disposition: A | Discharge status: Home / Self Care | Payer: PRIVATE HEALTH INSURANCE | Attending: Internal Medicine | Admitting: Internal Medicine

## 2019-04-09 DIAGNOSIS — R072 Precordial pain: Secondary | ICD-10-CM

## 2019-04-09 DIAGNOSIS — Z7982 Long term (current) use of aspirin: Secondary | ICD-10-CM | POA: Insufficient documentation

## 2019-04-09 DIAGNOSIS — E782 Mixed hyperlipidemia: Secondary | ICD-10-CM | POA: Insufficient documentation

## 2019-04-09 DIAGNOSIS — I2510 Atherosclerotic heart disease of native coronary artery without angina pectoris: Secondary | ICD-10-CM | POA: Insufficient documentation

## 2019-04-09 DIAGNOSIS — Z7902 Long term (current) use of antithrombotics/antiplatelets: Secondary | ICD-10-CM | POA: Insufficient documentation

## 2019-04-09 DIAGNOSIS — Z1159 Encounter for screening for other viral diseases: Secondary | ICD-10-CM | POA: Insufficient documentation

## 2019-04-09 DIAGNOSIS — I10 Essential (primary) hypertension: Secondary | ICD-10-CM | POA: Insufficient documentation

## 2019-04-09 DIAGNOSIS — R079 Chest pain, unspecified: Principal | ICD-10-CM | POA: Diagnosis present

## 2019-04-09 DIAGNOSIS — K219 Gastro-esophageal reflux disease without esophagitis: Secondary | ICD-10-CM | POA: Insufficient documentation

## 2019-04-09 DIAGNOSIS — Z79899 Other long term (current) drug therapy: Secondary | ICD-10-CM | POA: Insufficient documentation

## 2019-04-09 DIAGNOSIS — Z955 Presence of coronary angioplasty implant and graft: Secondary | ICD-10-CM | POA: Insufficient documentation

## 2019-04-09 DIAGNOSIS — Z87891 Personal history of nicotine dependence: Secondary | ICD-10-CM | POA: Insufficient documentation

## 2019-04-09 HISTORY — DX: Chest pain, unspecified: R07.9

## 2019-04-09 LAB — BASIC METABOLIC PANEL
Anion gap: 10 (ref 5–15)
BUN: 10 mg/dL (ref 6–20)
CO2: 23 mmol/L (ref 22–32)
Calcium: 9.1 mg/dL (ref 8.9–10.3)
Chloride: 102 mmol/L (ref 98–111)
Creatinine, Ser: 0.67 mg/dL (ref 0.44–1.00)
GFR calc Af Amer: >60 mL/min (ref >60)
GFR calc non Af Amer: >60 mL/min (ref >60)
Glucose, Bld: 102 mg/dL — ABNORMAL HIGH (ref 70–99)
Potassium: 3.5 mmol/L (ref 3.5–5.1)
Sodium: 135 mmol/L (ref 135–145)

## 2019-04-09 LAB — CBC
HCT: 35.8 % — ABNORMAL LOW (ref 36.0–46.0)
Hemoglobin: 11.6 g/dL — ABNORMAL LOW (ref 12.0–15.0)
MCH: 26.2 pg (ref 26.0–34.0)
MCHC: 32.4 g/dL (ref 30.0–36.0)
MCV: 81 fL (ref 80.0–100.0)
Platelets: 488 10*3/uL — ABNORMAL HIGH (ref 150–400)
RBC: 4.42 MIL/uL (ref 3.87–5.11)
RDW: 15.6 % — ABNORMAL HIGH (ref 11.5–15.5)
WBC: 10.5 10*3/uL (ref 4.0–10.5)
nRBC: 0 % (ref 0.0–0.2)

## 2019-04-09 LAB — TROPONIN I
Troponin I: <0.03 ng/mL (ref <0.03)
Troponin I: <0.03 ng/mL (ref <0.03)

## 2019-04-09 MED ORDER — OXYCODONE-ACETAMINOPHEN 5-325 MG PO TABS
1.0000 | ORAL_TABLET | ORAL | Status: AC
  Administered 2019-04-10: 1 via ORAL
  Filled 2019-04-09: qty 1

## 2019-04-09 MED ORDER — ACETAMINOPHEN 325 MG PO TABS
ORAL_TABLET | ORAL | Status: AC
  Administered 2019-04-09: 650 mg via ORAL
  Filled 2019-04-09: qty 2

## 2019-04-09 MED ORDER — LOSARTAN POTASSIUM 50 MG PO TABS
100.0000 mg | ORAL_TABLET | Freq: Every day | ORAL | Status: DC
  Administered 2019-04-09 – 2019-04-11 (×3): 100 mg via ORAL
  Filled 2019-04-09 (×3): qty 2

## 2019-04-09 MED ORDER — METOPROLOL SUCCINATE ER 25 MG PO TB24
25.0000 mg | ORAL_TABLET | Freq: Every day | ORAL | Status: DC
  Administered 2019-04-09: 25 mg via ORAL
  Filled 2019-04-09 (×2): qty 1

## 2019-04-09 MED ORDER — AMLODIPINE BESYLATE 5 MG PO TABS
10.0000 mg | ORAL_TABLET | Freq: Once | ORAL | Status: AC
  Administered 2019-04-09: 10 mg via ORAL
  Filled 2019-04-09: qty 2

## 2019-04-09 MED ORDER — ASPIRIN EC 81 MG PO TBEC
81.0000 mg | DELAYED_RELEASE_TABLET | Freq: Every day | ORAL | Status: DC
  Administered 2019-04-09 – 2019-04-11 (×3): 81 mg via ORAL
  Filled 2019-04-09 (×3): qty 1

## 2019-04-09 MED ORDER — ATORVASTATIN CALCIUM 20 MG PO TABS
80.0000 mg | ORAL_TABLET | Freq: Every day | ORAL | Status: DC
  Administered 2019-04-10 – 2019-04-11 (×3): 80 mg via ORAL
  Filled 2019-04-09 (×3): qty 4

## 2019-04-09 MED ORDER — DOCUSATE SODIUM 100 MG PO CAPS
100.0000 mg | ORAL_CAPSULE | Freq: Every day | ORAL | Status: DC
  Administered 2019-04-10 – 2019-04-11 (×3): 100 mg via ORAL
  Filled 2019-04-09 (×3): qty 1

## 2019-04-09 MED ORDER — ENOXAPARIN SODIUM 40 MG/0.4ML ~~LOC~~ SOLN
40.0000 mg | SUBCUTANEOUS | Status: DC
  Filled 2019-04-09 (×2): qty 0.4

## 2019-04-09 MED ORDER — HYDRALAZINE HCL 20 MG/ML IJ SOLN
10.0000 mg | Freq: Once | INTRAMUSCULAR | Status: AC
  Administered 2019-04-09: 10 mg via INTRAVENOUS

## 2019-04-09 MED ORDER — ENOXAPARIN SODIUM 40 MG/0.4ML ~~LOC~~ SOLN: 40.0000 mg | SUBCUTANEOUS | Status: DC

## 2019-04-09 MED ORDER — AMLODIPINE BESYLATE 5 MG PO TABS
7.5000 mg | ORAL_TABLET | Freq: Every day | ORAL | Status: DC
  Administered 2019-04-09 – 2019-04-11 (×3): 7.5 mg via ORAL
  Filled 2019-04-09 (×3): qty 2

## 2019-04-09 MED ORDER — ONDANSETRON HCL 4 MG/2ML IJ SOLN: 4.0000 mg | Freq: Four times a day (QID) | INTRAMUSCULAR | Status: DC | PRN

## 2019-04-09 MED ORDER — ACETAMINOPHEN 325 MG PO TABS
650.0000 mg | ORAL_TABLET | ORAL | Status: DC | PRN
  Administered 2019-04-09: 21:00:00 650 mg via ORAL
  Filled 2019-04-09: qty 2

## 2019-04-09 MED ORDER — HYDRALAZINE HCL 20 MG/ML IJ SOLN
INTRAMUSCULAR | Status: AC
  Filled 2019-04-09: qty 1

## 2019-04-09 MED ORDER — CLOPIDOGREL BISULFATE 75 MG PO TABS
75.0000 mg | ORAL_TABLET | Freq: Every day | ORAL | Status: DC
  Administered 2019-04-10 – 2019-04-11 (×3): 75 mg via ORAL
  Filled 2019-04-09 (×3): qty 1

## 2019-04-09 MED ORDER — IOPAMIDOL (ISOVUE-370) INJECTION 76%
100.0000 mL | Freq: Once | INTRAVENOUS | Status: AC | PRN
  Administered 2019-04-09: 100 mL via INTRAVENOUS

## 2019-04-09 MED ORDER — NITROGLYCERIN 0.4 MG SL SUBL
0.4000 mg | SUBLINGUAL_TABLET | SUBLINGUAL | Status: DC | PRN
  Administered 2019-04-09: 0.4 mg via SUBLINGUAL
  Filled 2019-04-09: qty 1

## 2019-04-09 MED ORDER — SODIUM CHLORIDE 0.9% FLUSH: 3.0000 mL | Freq: Once | INTRAVENOUS | Status: DC

## 2019-04-09 NOTE — ED Notes (Signed)
ED TO INPATIENT HANDOFF REPORT  ED Nurse Name and Phone #: Bascom Levels Name/Age/Gender Heather Mahoney 53 y.o. female Room/Bed: ED15A/ED15A  Code Status   Code Status: Full Code  Home/SNF/Other Home Patient oriented to: self, place, time and situation Is this baseline? Yes   Triage Complete: Triage complete  Chief Complaint Chest Pain  Triage Note Pt to ED via POV c/o central chest pain. Pt states that she was at work and started having chest pain. Pt is diaphoretic in triage. Pt recently had stents put in on 3/26 and had triple aid repair on 08/10/18. Pt denies radiation of the pain but states that she is having pain in both her ears. Pt denies N/V. Pt states that she is lightheaded. 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    Allergies Allergies  Allergen Reactions  . Strawberry Extract Hives and Swelling  . Strawberry Flavor Rash    Level of Care/Admitting Diagnosis ED Disposition    ED Disposition Condition [facility]: [place] [100120]  Level of Care: Telemetry [5]  Covid Evaluation: Screening Protocol (No Symptoms)  Diagnosis: Chest pain [423536]  Admitting Physician: Mayer Camel [[id number]]  Attending Physician: Mayer Camel [[id number]]  PT Class (Do Not Modify): Observation [104]  PT Acc Code (Do Not Modify): Observation [10022]       B Medical/Surgery History Past Medical History:  Diagnosis Date  . CAD (coronary artery disease)    a. PCI to proximal and distal RCA 3/26  . GERD (gastroesophageal reflux disease)   . Heart murmur    MR  . Hiatal hernia   . Hyperlipidemia   . Hypertension   . Tobacco abuse    a. Started at age 62, quit during 3 pregnancies. b. 1 PPD for a 40 pack-year hx (2019)    Past Surgical History:  Procedure Laterality Date  . BREAST SURGERY  1986   I&D for 'milk duct'  . CORONARY STENT INTERVENTION N/A 01/13/2019   Procedure: CORONARY  STENT INTERVENTION;  Surgeon: Nelva Bush, MD;  Location: [facility];  Service: Cardiovascular;  Laterality: N/A;  . EMBOLECTOMY  08/13/2018   Procedure: Right SFA and profunda femoris Fogarty embolectomy 3  Fogarty embolectomy balloon;  Surgeon: Algernon Huxley, MD;  Location: [facility];  Service: Vascular;;  . ENDARTERECTOMY FEMORAL Right 08/13/2018   Procedure: Right common femoral, profunda femoris, and superficial femoral artery endarterectomies and patch angioplasty ;  Surgeon: Algernon Huxley, MD;  Location: [facility];  Service: Vascular;  Laterality: Right;  . ENDOVASCULAR REPAIR/STENT GRAFT N/A 08/13/2018   Procedure: ENDOVASCULAR REPAIR/STENT GRAFT;  Surgeon: Katha Cabal, MD;  Location: [facility];  Service: Cardiovascular;  Laterality: N/A;  . LEFT HEART CATH AND CORONARY ANGIOGRAPHY Left 01/13/2019   Procedure: LEFT HEART CATH AND CORONARY ANGIOGRAPHY;  Surgeon: Minna Merritts, MD;  Location: [facility];  Service: Cardiovascular;  Laterality: Left;  . SALPINGOOPHORECTOMY Left 2000     A IV Location/Drains/Wounds Patient Lines/Drains/Airways Status   Active Line/Drains/Airways    Name:   Placement date:   Placement time:   Site:   Days:   Peripheral IV 04/09/19 Left Antecubital   04/09/19    1645    Antecubital   less than 1   Sheath 08/13/18 Left   08/13/18    1142    -   239   Sheath  08/13/18 Right Arterial;Femoral   08/13/18    1143    Arterial;Femoral   239   Incision (Closed) 08/13/18 Groin Right   08/13/18    1711     239          Intake/Output Last 24 hours No intake or output data in the 24 hours ending 04/09/19 2053  Labs/Imaging Results for orders placed or performed during the hospital encounter of 04/09/19 (from the past 48 hour(s))  Basic metabolic panel     Status: Abnormal   Collection Time: 04/09/19  4:44 PM  Result Value Ref Range   Sodium 135 135 - 145 mmol/L   Potassium 3.5 3.5 - 5.1 mmol/L   Chloride 102 98 - 111  mmol/L   CO2 23 22 - 32 mmol/L   Glucose, Bld 102 (H) 70 - 99 mg/dL   BUN 10 6 - 20 mg/dL   Creatinine, Ser 0.67 0.44 - 1.00 mg/dL   Calcium 9.1 8.9 - 10.3 mg/dL   GFR calc non Af Amer >60 >60 mL/min   GFR calc Af Amer >60 >60 mL/min   Anion gap 10 5 - 15    Comment: Performed at [facility], [address] [place], [place] [address]  CBC     Status: Abnormal   Collection Time: 04/09/19  4:44 PM  Result Value Ref Range   WBC 10.5 4.0 - 10.5 K/uL   RBC 4.42 3.87 - 5.11 MIL/uL   Hemoglobin 11.6 (L) 12.0 - 15.0 g/dL   HCT 35.8 (L) 36.0 - 46.0 %   MCV 81.0 80.0 - 100.0 fL   MCH 26.2 26.0 - 34.0 pg   MCHC 32.4 30.0 - 36.0 g/dL   RDW 15.6 (H) 11.5 - 15.5 %   Platelets 488 (H) 150 - 400 K/uL   nRBC 0.0 0.0 - 0.2 %    Comment: Performed at [facility], [address] [place], [place] [address]  Troponin I - ONCE - STAT     Status: None   Collection Time: 04/09/19  4:44 PM  Result Value Ref Range   Troponin I <0.03 <0.03 ng/mL    Comment: Performed at [facility], [address] [place], [place] [address]   Dg Chest Port 1 View  Result Date: 04/09/2019 CLINICAL DATA:  Chest pain. EXAM: PORTABLE CHEST 1 VIEW COMPARISON:  Chest CT 01/04/2019 FINDINGS: Cardiomediastinal silhouette is normal. Mediastinal contours appear intact. There is no evidence of focal airspace consolidation, pleural effusion or pneumothorax. Osseous structures are without acute abnormality. Soft tissues are grossly normal. IMPRESSION: No active disease. Electronically Signed   By: Fidela Salisbury M.D.   On: 04/09/2019 17:07   Ct Angio Chest/abd/pel For Dissection W And/or Wo Contrast  Result Date: 04/09/2019 CLINICAL DATA:  Central chest pain EXAM: CT ANGIOGRAPHY CHEST, ABDOMEN AND PELVIS TECHNIQUE: Multidetector CT imaging through the chest, abdomen and pelvis was performed using the standard protocol during bolus administration of intravenous contrast. Multiplanar reconstructed  images and MIPs were obtained and reviewed to evaluate the vascular anatomy. CONTRAST:  116mL ISOVUE-370 IOPAMIDOL (ISOVUE-370) INJECTION 76% COMPARISON:  CT 01/13/2019, 08/10/2018 FINDINGS: CTA CHEST FINDINGS Cardiovascular: Non contrasted images of the chest demonstrate no intramural hematoma. Moderate aortic atherosclerosis. Coronary vascular calcifications. Aorta is nonaneurysmal. No dissection is seen. Heart size within normal limits. Trace pericardial effusion. Common origin of the right brachiocephalic and left common carotid artery. Mediastinum/Nodes: Midline trachea. No thyroid mass. 12 mm left paratracheal lymph node. Esophagus within normal  limits. Lungs/Pleura: Mild apically emphysema. No acute consolidation, effusion or pneumothorax. Musculoskeletal: No acute or suspicious osseous abnormality. Review of the MIP images confirms the above findings. CTA ABDOMEN AND PELVIS FINDINGS VASCULAR Aorta: Infrarenal aortoiliac stent graft with patency. No dissection. Celiac: Slight narrowing at the origin with distal patency. SMA: Patent without evidence of aneurysm, dissection, vasculitis or significant stenosis. Renals: Single right and single left renal arteries show no significant stenosis. IMA: Not clearly identified, possibly chronically occluded. Inflow: Moderate irregular narrowing at the origins of both internal iliac vessels. Common iliac limbs of the stent graft are widely patent. External iliac vessels demonstrate normal flow enhancement and no significant stenosis or occlusion. Common femoral arteries are also patent. Veins: No obvious venous abnormality within the limitations of this arterial phase study. Review of the MIP images confirms the above findings. NON-VASCULAR Hepatobiliary: No focal liver abnormality is seen. No gallstones, gallbladder wall thickening, or biliary dilatation. Pancreas: Unremarkable. No pancreatic ductal dilatation or surrounding inflammatory changes. Spleen: Normal in size  without focal abnormality. Adrenals/Urinary Tract: Adrenal glands are unremarkable. Kidneys are normal, without renal calculi, focal lesion, or hydronephrosis. Bladder is unremarkable. Stomach/Bowel: Stomach is within normal limits. Appendix appears normal. No evidence of bowel wall thickening, distention, or inflammatory changes. Lymphatic: No significantly enlarged lymph nodes. Reproductive: Uterus and bilateral adnexa are unremarkable. Other: Negative for free air or free fluid Musculoskeletal: No acute or suspicious osseous abnormality Review of the MIP images confirms the above findings. IMPRESSION: 1. Negative for acute aortic dissection or aneurysm. Status post aortoiliac stent graft with widely patent graft. 2. Mild apical emphysema 3. No CT evidence for acute intra-abdominal or pelvic abnormality Electronically Signed   By: Donavan Foil M.D.   On: 04/09/2019 18:27    Pending Labs Unresulted Labs (From admission, onward)    Start     Ordered   04/16/19 0500  Creatinine, serum  (enoxaparin (LOVENOX)    CrCl >/= 30 ml/min)  Weekly,   STAT    Comments: while on enoxaparin therapy    04/09/19 2015   04/09/19 2016  Troponin I - Now Then Q3H  Now then every 3 hours,   STAT     04/09/19 2015   04/09/19 1716  Novel Coronavirus,NAA,(SEND-OUT TO REF LAB - TAT 24-48 hrs); Hosp Order  (Asymptomatic Patients Labs)  Once,   STAT    Question:  Rule Out  Answer:  Yes   04/09/19 1715          Vitals/Pain Today's Vitals   04/09/19 1930 04/09/19 2000 04/09/19 2013 04/09/19 2033  BP: (!) 176/100 (!) 153/104  (!) 173/97  Pulse: 77 88  94  Resp: 13 11  (!) 23  Temp:      TempSrc:      SpO2: 100% 100%  100%  Weight:      Height:      PainSc:   10-Worst pain ever     Isolation Precautions No active isolations  Medications Medications  sodium chloride flush (NS) 0.9 % injection 3 mL (has no administration in time range)  nitroGLYCERIN (NITROSTAT) SL tablet 0.4 mg (0.4 mg Sublingual Given  04/09/19 1802)  aspirin EC tablet 81 mg (has no administration in time range)  atorvastatin (LIPITOR) tablet 80 mg (has no administration in time range)  clopidogrel (PLAVIX) tablet 75 mg (has no administration in time range)  docusate sodium (COLACE) capsule 100 mg (has no administration in time range)  metoprolol succinate (TOPROL-XL) 24 hr tablet 25 mg (  has no administration in time range)  amLODipine (NORVASC) tablet 7.5 mg (7.5 mg Oral Given 04/09/19 2048)  losartan (COZAAR) tablet 100 mg (has no administration in time range)  acetaminophen (TYLENOL) tablet 650 mg (650 mg Oral Given 04/09/19 2031)  ondansetron (ZOFRAN) injection 4 mg (has no administration in time range)  enoxaparin (LOVENOX) injection 40 mg (has no administration in time range)  amLODipine (NORVASC) tablet 10 mg (10 mg Oral Given 04/09/19 1800)  iopamidol (ISOVUE-370) 76 % injection 100 mL (100 mLs Intravenous Contrast Given 04/09/19 1737)  hydrALAZINE (APRESOLINE) injection 10 mg (10 mg Intravenous Given 04/09/19 1859)    Mobility walks Low fall risk   Focused Assessments Cardiac Assessment Handoff:  Cardiac Rhythm: Normal sinus rhythm Lab Results  Component Value Date   TROPONINI <0.03 04/09/2019   No results found for: DDIMER Does the Patient currently have chest pain? No     R Recommendations: See Admitting Provider Note  Report given to:   Additional Notes:

## 2019-04-09 NOTE — ED Provider Notes (Signed)
Reviewed entered by me at 1632 Heart rate 75 QRS 90 QTc 425 Normal sinus rhythm, no evidence of acute ischemia.   Delman Kitten, MD 04/09/19 [id number]

## 2019-04-09 NOTE — H&P (Signed)
[facility] NAME: Heather Mahoney    MR#:  [id number]  DATE OF BIRTH:  12/19/1965  DATE OF ADMISSION:  04/09/2019  PRIMARY CARE PHYSICIAN: Patient, No Pcp Per   REQUESTING/REFERRING PHYSICIAN:Mark Quale, MD  CHIEF COMPLAINT:   Chief Complaint  Patient presents with   Chest Pain    HISTORY OF PRESENT ILLNESS:  Heather Mahoney  is a 53 y.o. female with a known history of coronary artery disease, hyperlipidemia, hypertension, GERD.  She is status post cardiac stent placement with Dr. Rockey Situ on 01/13/2019 as well as abdominal aortic aneurysm repair with Dr. Lucky Cowboy in October 2019.  She presented to the emergency room complaining of chest pain with dyspnea.  Chest pain began while patient was at work around 1415 described as pressure and tightness with a pain score 10 out of 10 nonradiating pain.  She noted associated shortness of breath, diaphoresis, and nausea as well as weakness.  Patient noted no vomiting.  Pain lasted less than 5 minutes.  However, the pain has been intermittent since that time.  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  Blood pressure was 202/107 on her arrival to the emergency room.  Patient is uncertain whether or not she took her daily medications.  Troponin is less than 0.03 on arrival with EKG demonstrating normal sinus rhythm with no evidence of acute ischemia.  X-ray shows no acute pulmonary disease.  CTA chest is negative for pulmonary embolism.  Rapid COVID-19 testing is negative.  We have admitted her to the hospitalist service for further management.  PAST MEDICAL HISTORY:   Past Medical History:  Diagnosis Date   CAD (coronary artery disease)    a. PCI to proximal and distal RCA 3/26   GERD (gastroesophageal reflux disease)    Heart murmur    MR   Hiatal hernia    Hyperlipidemia    Hypertension    Tobacco abuse    a. Started at age 55, quit  during 3 pregnancies. b. 1 PPD for a 40 pack-year hx (2019)     PAST SURGICAL HISTORY:   Past Surgical History:  Procedure Laterality Date   BREAST SURGERY  1986   I&D for 'milk duct'   CORONARY STENT INTERVENTION N/A 01/13/2019   Procedure: CORONARY STENT INTERVENTION;  Surgeon: Nelva Bush, MD;  Location: [facility];  Service: Cardiovascular;  Laterality: N/A;   EMBOLECTOMY  08/13/2018   Procedure: Right SFA and profunda femoris Fogarty embolectomy 3  Fogarty embolectomy balloon;  Surgeon: Algernon Huxley, MD;  Location: [facility];  Service: Vascular;;   ENDARTERECTOMY FEMORAL Right 08/13/2018   Procedure: Right common femoral, profunda femoris, and superficial femoral artery endarterectomies and patch angioplasty ;  Surgeon: Algernon Huxley, MD;  Location: [facility];  Service: Vascular;  Laterality: Right;   ENDOVASCULAR REPAIR/STENT GRAFT N/A 08/13/2018   Procedure: ENDOVASCULAR REPAIR/STENT GRAFT;  Surgeon: Katha Cabal, MD;  Location: [facility];  Service: Cardiovascular;  Laterality: N/A;   LEFT HEART CATH AND CORONARY ANGIOGRAPHY Left 01/13/2019   Procedure: LEFT HEART CATH AND CORONARY ANGIOGRAPHY;  Surgeon: Minna Merritts, MD;  Location: [facility];  Service: Cardiovascular;  Laterality: Left;   SALPINGOOPHORECTOMY Left 2000    SOCIAL HISTORY:   Social History   Tobacco Use   Smoking status: Former Smoker    Packs/day: 0.25    Years: 38.00  Pack years: 9.50    Types: Cigarettes    Quit date: 08/10/2018    Years since quitting: 0.6   Smokeless tobacco: Never Used  Substance Use Topics   Alcohol use: Yes    Comment: occassional    FAMILY HISTORY:   Family History  Problem Relation Age of Onset   Diabetes Mother    Hypertension Mother    Hyperlipidemia Mother    Heart disease Mother        CABG X 4   Asthma Mother    Sarcoidosis Mother        In remission   Heart attack Mother    Diabetes Maternal  Grandmother    Heart disease Maternal Grandmother    Hyperlipidemia Maternal Grandmother     DRUG ALLERGIES:   Allergies  Allergen Reactions   Strawberry Extract Hives and Swelling   Strawberry Flavor Rash    REVIEW OF SYSTEMS:   Review of Systems  Constitutional: Positive for diaphoresis. Negative for chills, fever and malaise/fatigue.  HENT: Negative for congestion, sinus pain and sore throat.   Eyes: Negative for blurred vision, double vision and pain.  Respiratory: Positive for shortness of breath. Negative for cough, sputum production and wheezing.   Cardiovascular: Positive for chest pain. Negative for palpitations, orthopnea and leg swelling.  Gastrointestinal: Positive for nausea. Negative for abdominal pain, blood in stool, constipation, diarrhea, heartburn, melena and vomiting.  Genitourinary: Negative for dysuria, flank pain, frequency and hematuria.  Musculoskeletal: Negative for falls.  Skin: Negative for itching and rash.  Neurological: Positive for dizziness. Negative for loss of consciousness and weakness.  Psychiatric/Behavioral: Negative.  Negative for depression.      MEDICATIONS AT HOME:   Prior to Admission medications   Medication Sig Start Date End Date Taking? Authorizing Provider  amLODipine (NORVASC) 5 MG tablet Take 7.5 mg by mouth daily.   Yes [provider]  aspirin 81 MG EC tablet Take 1 tablet (81 mg total) by mouth daily. 01/14/19  Yes Visser, Jacquelyn D, PA-C  atorvastatin (LIPITOR) 80 MG tablet Take 1 tablet (80 mg total) by mouth daily. 01/14/19  Yes Marrianne Mood D, PA-C  clopidogrel (PLAVIX) 75 MG tablet Take 1 tablet (75 mg total) by mouth daily. 01/14/19  Yes Marrianne Mood D, PA-C  docusate sodium (COLACE) 100 MG capsule Take 1 capsule (100 mg total) by mouth daily. 08/16/18  Yes Vaughan Basta, MD  losartan (COZAAR) 100 MG tablet Take 100 mg by mouth daily.   Yes [provider]  metoprolol succinate  (TOPROL-XL) 25 MG 24 hr tablet Take 1 tablet (25 mg total) by mouth daily. 01/14/19  Yes Visser, Jacquelyn D, PA-C  losartan (COZAAR) 25 MG tablet Take 1 tablet (25 mg total) by mouth daily. Patient not taking: Reported on 04/09/2019 01/14/19   Marrianne Mood D, PA-C  nitroGLYCERIN (NITROSTAT) 0.4 MG SL tablet Place 1 tablet (0.4 mg total) under the tongue every 5 (five) minutes as needed for chest pain. 01/26/19 04/26/19  Minna Merritts, MD  oxyCODONE-acetaminophen (PERCOCET/ROXICET) 5-325 MG tablet Take 1 tablet by mouth every 6 (six) hours as needed for moderate pain or severe pain. Patient not taking: Reported on 04/01/2019 11/11/18   Triplett, Johnette Abraham B, FNP      VITAL SIGNS:  Blood pressure (!) 176/100, pulse 77, temperature 99 F (37.2 C), temperature source Oral, resp. rate 13, height 5\' 6"  (1.676 m), weight 77.1 kg, SpO2 100 %.  PHYSICAL EXAMINATION:  Physical Exam Vitals signs and nursing  note reviewed.  Constitutional:      General: She is not in acute distress.    Appearance: She is well-developed. She is not ill-appearing.  HENT:     Head: Normocephalic.  Eyes:     Extraocular Movements: Extraocular movements intact.     Pupils: Pupils are equal, round, and reactive to light.  Neck:     Musculoskeletal: Normal range of motion and neck supple.  Cardiovascular:     Rate and Rhythm: Normal rate and regular rhythm.     Pulses:          Carotid pulses are 2+ on the right side and 2+ on the left side.      Radial pulses are 2+ on the right side and 2+ on the left side.       Dorsalis pedis pulses are 2+ on the right side and 2+ on the left side.       Posterior tibial pulses are 2+ on the right side and 2+ on the left side.     Heart sounds: No friction rub. No gallop.   Pulmonary:     Effort: Pulmonary effort is normal. No respiratory distress.     Breath sounds: Normal breath sounds. No decreased breath sounds, wheezing, rhonchi or rales.  Chest:     Chest wall: No mass or  edema.  Abdominal:     General: Bowel sounds are normal. There is no abdominal bruit.     Palpations: Abdomen is soft.     Tenderness: There is no abdominal tenderness. There is no guarding or rebound.  Musculoskeletal: Normal range of motion.     Right lower leg: She exhibits no tenderness. No edema.     Left lower leg: She exhibits no tenderness. No edema.  Skin:    General: Skin is warm and dry.     Capillary Refill: Capillary refill takes less than 2 seconds.     Findings: No rash.  Neurological:     General: No focal deficit present.     Mental Status: She is alert and oriented to person, place, and time.  Psychiatric:        Mood and Affect: Mood normal.        Behavior: Behavior normal.     LABORATORY PANEL:   CBC Recent Labs  Lab 04/09/19 1644  WBC 10.5  HGB 11.6*  HCT 35.8*  PLT 488*   ------------------------------------------------------------------------------------------------------------------  Chemistries  Recent Labs  Lab 04/09/19 1644  NA 135  K 3.5  CL 102  CO2 23  GLUCOSE 102*  BUN 10  CREATININE 0.67  CALCIUM 9.1   ------------------------------------------------------------------------------------------------------------------  Cardiac Enzymes Recent Labs  Lab 04/09/19 1644  TROPONINI <0.03   ------------------------------------------------------------------------------------------------------------------  RADIOLOGY:  Dg Chest Port 1 View  Result Date: 04/09/2019 CLINICAL DATA:  Chest pain. EXAM: PORTABLE CHEST 1 VIEW COMPARISON:  Chest CT 01/04/2019 FINDINGS: Cardiomediastinal silhouette is normal. Mediastinal contours appear intact. There is no evidence of focal airspace consolidation, pleural effusion or pneumothorax. Osseous structures are without acute abnormality. Soft tissues are grossly normal. IMPRESSION: No active disease. Electronically Signed   By: Fidela Salisbury M.D.   On: 04/09/2019 17:07   Ct Angio Chest/abd/pel  For Dissection W And/or Wo Contrast  Result Date: 04/09/2019 CLINICAL DATA:  Central chest pain EXAM: CT ANGIOGRAPHY CHEST, ABDOMEN AND PELVIS TECHNIQUE: Multidetector CT imaging through the chest, abdomen and pelvis was performed using the standard protocol during bolus administration of intravenous contrast. Multiplanar reconstructed images and MIPs  were obtained and reviewed to evaluate the vascular anatomy. CONTRAST:  13mL ISOVUE-370 IOPAMIDOL (ISOVUE-370) INJECTION 76% COMPARISON:  CT 01/13/2019, 08/10/2018 FINDINGS: CTA CHEST FINDINGS Cardiovascular: Non contrasted images of the chest demonstrate no intramural hematoma. Moderate aortic atherosclerosis. Coronary vascular calcifications. Aorta is nonaneurysmal. No dissection is seen. Heart size within normal limits. Trace pericardial effusion. Common origin of the right brachiocephalic and left common carotid artery. Mediastinum/Nodes: Midline trachea. No thyroid mass. 12 mm left paratracheal lymph node. Esophagus within normal limits. Lungs/Pleura: Mild apically emphysema. No acute consolidation, effusion or pneumothorax. Musculoskeletal: No acute or suspicious osseous abnormality. Review of the MIP images confirms the above findings. CTA ABDOMEN AND PELVIS FINDINGS VASCULAR Aorta: Infrarenal aortoiliac stent graft with patency. No dissection. Celiac: Slight narrowing at the origin with distal patency. SMA: Patent without evidence of aneurysm, dissection, vasculitis or significant stenosis. Renals: Single right and single left renal arteries show no significant stenosis. IMA: Not clearly identified, possibly chronically occluded. Inflow: Moderate irregular narrowing at the origins of both internal iliac vessels. Common iliac limbs of the stent graft are widely patent. External iliac vessels demonstrate normal flow enhancement and no significant stenosis or occlusion. Common femoral arteries are also patent. Veins: No obvious venous abnormality within the  limitations of this arterial phase study. Review of the MIP images confirms the above findings. NON-VASCULAR Hepatobiliary: No focal liver abnormality is seen. No gallstones, gallbladder wall thickening, or biliary dilatation. Pancreas: Unremarkable. No pancreatic ductal dilatation or surrounding inflammatory changes. Spleen: Normal in size without focal abnormality. Adrenals/Urinary Tract: Adrenal glands are unremarkable. Kidneys are normal, without renal calculi, focal lesion, or hydronephrosis. Bladder is unremarkable. Stomach/Bowel: Stomach is within normal limits. Appendix appears normal. No evidence of bowel wall thickening, distention, or inflammatory changes. Lymphatic: No significantly enlarged lymph nodes. Reproductive: Uterus and bilateral adnexa are unremarkable. Other: Negative for free air or free fluid Musculoskeletal: No acute or suspicious osseous abnormality Review of the MIP images confirms the above findings. IMPRESSION: 1. Negative for acute aortic dissection or aneurysm. Status post aortoiliac stent graft with widely patent graft. 2. Mild apical emphysema 3. No CT evidence for acute intra-abdominal or pelvic abnormality Electronically Signed   By: Donavan Foil M.D.   On: 04/09/2019 18:27      IMPRESSION AND PLAN:   1.  Chest pain - Continue serial troponin levels - Repeat EKG in the a.m. - Telemetry monitoring - We will treat pain with analgesic as needed - Patient has been admitted for observation. -CTA chest was negative for pulmonary embolism with patent abdominal aortic aneurysm graft -Aspirin, statin therapy, and beta-blocker restarted --lipid panel  2.  Uncontrolled hypertension --Patient's home medications including losartan, metoprolol, Norvasc have been restarted. - We will treat persistent hypertension expectantly with hydralazine IV - Telemetry monitoring  3. hyperlipidemia - Lipid panel pending - Lipitor continued  4.  GERD - PPI therapy  DVT and PPI  prophylaxis initiated   All the records are reviewed and case discussed with ED provider. The plan of care was discussed in details with the patient (and family). I answered all questions. The patient agreed to proceed with the above mentioned plan. Further management will depend upon hospital course.   CODE STATUS: Full code  TOTAL TIME TAKING CARE OF THIS PATIENT:45 minutes.    [place] on 04/09/2019 at 7:47 PM  Pager - [phone]  After 6pm go to [email or web address] - [facility]  Office  [phone]  CC: Primary  care physician; Patient, No Pcp Per   Note: This dictation was prepared with Dragon dictation along with smaller phrase technology. Any transcriptional errors that result from this process are unintentional.

## 2019-04-09 NOTE — ED Notes (Signed)
Patient transported to CT 

## 2019-04-09 NOTE — ED Notes (Signed)
Pt has not taken her amlodipine today.

## 2019-04-09 NOTE — ED Notes (Signed)
Blue top also sent  

## 2019-04-09 NOTE — ED Triage Notes (Signed)
Pt to ED via POV c/o central chest pain. Pt states that she was at work and started having chest pain. Pt is diaphoretic in triage. Pt recently had stents put in on 3/26 and had triple aid repair on 08/10/18. Pt denies radiation of the pain but states that she is having pain in both her ears. Pt denies N/V. Pt states that she is lightheaded. 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

## 2019-04-09 NOTE — ED Provider Notes (Signed)
Medical screening examination/treatment/procedure(s) were conducted as a shared visit with non-physician practitioner(s) and myself.  I personally evaluated the patient during the encounter.   We have ordered CT angiogram to evaluate for cause of chest and back pain including rule out dissection, new aneurysm etc.  Patient has multiple coronary risk factors and known significant coronary disease.  First EKG reassuring without evidence of acute ischemia.  Severe hypertension, will treat with nitrates and also her home Norvasc which she has not taken yet today  She is in no acute distress but does continue to note his pressure across the front of the chest.  Discussed with the patient we certainly anticipate a need for admission for her, for further work-up is pending at this time  Yailyn M Isaacks was evaluated in Emergency Department on 04/09/2019 for the symptoms described in the history of present illness. She was evaluated in the context of the global COVID-19 pandemic, which necessitated consideration that the patient might be at risk for infection with the SARS-CoV-2 virus that causes COVID-19. Institutional protocols and algorithms that pertain to the evaluation of patients at risk for COVID-19 are in a state of rapid change based on information released by regulatory bodies including the CDC and federal and state organizations. These policies and algorithms were followed during the patient's care in the ED.    Delman Kitten, MD 04/09/19 1714

## 2019-04-09 NOTE — ED Notes (Addendum)
Pt refuses covid swab - Dr Jacqualine Code notified

## 2019-04-09 NOTE — ED Notes (Signed)
Pt refused 2nd nitro stating that it gives her a headache BP173/102

## 2019-04-09 NOTE — ED Provider Notes (Addendum)
[facility] Emergency Department Provider Note  ____________________________________________   None    (approximate)  I have reviewed the triage vital signs and the nursing notes.   HISTORY  Chief Complaint Chest Pain    HPI Heather Mahoney is a 53 y.o. female presents emergency department complaint of chest pain with some shortness of breath.  She states she was at work and approximately around 215 230 started to have a headache and chest pain.  She went to the pharmacist and he took her blood pressure which was greatly elevated at 198/128.  She states she did get diaphoretic with chest pain.  She had stents placed in March along with a AAA repair in March.  She took all of her regular medications but did not take the extra dose of amlodipine or nitroglycerin.   She also states that she has had upper back pain for the last 2 days.   Past Medical History:  Diagnosis Date  . CAD (coronary artery disease)    a. PCI to proximal and distal RCA 3/26  . GERD (gastroesophageal reflux disease)   . Heart murmur    MR  . Hiatal hernia   . Hyperlipidemia   . Hypertension   . Tobacco abuse    a. Started at age 66, quit during 3 pregnancies. b. 1 PPD for a 40 pack-year hx (2019)     Patient Active Problem List   Diagnosis Date Noted  . Atherosclerosis of native arteries of extremity with intermittent claudication ([place]) 04/01/2019  . Coronary artery disease of native artery of native heart with stable angina pectoris ([place]) 01/26/2019  . CAD (coronary artery disease) 01/14/2019  . Unstable angina ([place]) 01/13/2019  . PAD (peripheral artery disease) ([place]) 08/30/2018  . AAA (abdominal aortic aneurysm) ([place]) 08/10/2018  . Chest pain with moderate risk for cardiac etiology 12/18/2017  . 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 PVC (premature ventricular contraction) 02/12/2016  . Essential hypertension, benign 02/12/2016  . Tobacco use disorder 02/12/2016  . Migraine headache  01/09/2016  . Lymphocytosis 12/21/2015  . Hypertension 12/12/2015  . Mixed hyperlipidemia 12/12/2015  . Vitamin D deficiency 12/12/2015  . Mitral regurgitation 12/12/2015  . Hypertensive urgency 11/29/2015  . Abdominal pulsatile mass 11/29/2015    Past Surgical History:  Procedure Laterality Date  . BREAST SURGERY  1986   I&D for 'milk duct'  . CORONARY STENT INTERVENTION N/A 01/13/2019   Procedure: CORONARY STENT INTERVENTION;  Surgeon: Nelva Bush, MD;  Location: [facility];  Service: Cardiovascular;  Laterality: N/A;  . EMBOLECTOMY  08/13/2018   Procedure: Right SFA and profunda femoris Fogarty embolectomy 3  Fogarty embolectomy balloon;  Surgeon: Algernon Huxley, MD;  Location: [facility];  Service: Vascular;;  . ENDARTERECTOMY FEMORAL Right 08/13/2018   Procedure: Right common femoral, profunda femoris, and superficial femoral artery endarterectomies and patch angioplasty ;  Surgeon: Algernon Huxley, MD;  Location: [facility];  Service: Vascular;  Laterality: Right;  . ENDOVASCULAR REPAIR/STENT GRAFT N/A 08/13/2018   Procedure: ENDOVASCULAR REPAIR/STENT GRAFT;  Surgeon: Katha Cabal, MD;  Location: [facility];  Service: Cardiovascular;  Laterality: N/A;  . LEFT HEART CATH AND CORONARY ANGIOGRAPHY Left 01/13/2019   Procedure: LEFT HEART CATH AND CORONARY ANGIOGRAPHY;  Surgeon: Minna Merritts, MD;  Location: [facility];  Service: Cardiovascular;  Laterality: Left;  . SALPINGOOPHORECTOMY Left 2000    Prior to Admission medications   Medication Sig Start Date End Date Taking? Authorizing Provider  amLODipine (NORVASC) 5 MG tablet Take 7.5 mg by mouth daily.   Yes [provider]  aspirin 81 MG EC tablet Take 1 tablet (81 mg total) by mouth daily. 01/14/19  Yes Visser, Jacquelyn D, PA-C  atorvastatin (LIPITOR) 80 MG tablet Take 1 tablet (80 mg total) by mouth daily. 01/14/19  Yes Marrianne Mood D, PA-C  clopidogrel (PLAVIX) 75 MG tablet Take 1  tablet (75 mg total) by mouth daily. 01/14/19  Yes Marrianne Mood D, PA-C  docusate sodium (COLACE) 100 MG capsule Take 1 capsule (100 mg total) by mouth daily. 08/16/18  Yes Vaughan Basta, MD  losartan (COZAAR) 100 MG tablet Take 100 mg by mouth daily.   Yes [provider]  metoprolol succinate (TOPROL-XL) 25 MG 24 hr tablet Take 1 tablet (25 mg total) by mouth daily. 01/14/19  Yes Visser, Jacquelyn D, PA-C  losartan (COZAAR) 25 MG tablet Take 1 tablet (25 mg total) by mouth daily. Patient not taking: Reported on 04/09/2019 01/14/19   Marrianne Mood D, PA-C  nitroGLYCERIN (NITROSTAT) 0.4 MG SL tablet Place 1 tablet (0.4 mg total) under the tongue every 5 (five) minutes as needed for chest pain. 01/26/19 04/26/19  Minna Merritts, MD  oxyCODONE-acetaminophen (PERCOCET/ROXICET) 5-325 MG tablet Take 1 tablet by mouth every 6 (six) hours as needed for moderate pain or severe pain. Patient not taking: Reported on 04/01/2019 11/11/18   Victorino Dike, FNP    Allergies Strawberry extract and Strawberry flavor  Family History  Problem Relation Age of Onset  . Diabetes Mother   . Hypertension Mother   . Hyperlipidemia Mother   . Heart disease Mother        CABG X 4  . Asthma Mother   . Sarcoidosis Mother        In remission  . Heart attack Mother   . Diabetes Maternal Grandmother   . Heart disease Maternal Grandmother   . Hyperlipidemia Maternal Grandmother     Social History Social History   Tobacco Use  . Smoking status: Former Smoker    Packs/day: 0.25    Years: 38.00    Pack years: 9.50    Types: Cigarettes    Quit date: 08/10/2018    Years since quitting: 0.6  . Smokeless tobacco: Never Used  Substance Use Topics  . Alcohol use: Yes    Comment: occassional  . Drug use: Yes    Types: Marijuana    Comment: smokes Marijuana 'when I cant sleep, maybe once or twice a week'    Review of Systems  Constitutional: No fever/chills Eyes: No visual changes.  ENT: No sore throat. Respiratory: Denies cough, shortness of breath Cardiovascular: Positive chest pain Genitourinary: Negative for dysuria. Musculoskeletal: Negative for back pain.  Positive upper back pain Skin: Negative for rash.    ____________________________________________   PHYSICAL EXAM:  VITAL SIGNS: ED Triage Vitals  Enc Vitals Group     BP 04/09/19 1633 (!) 202/107     Pulse Rate 04/09/19 1633 73     Resp 04/09/19 1633 16     Temp 04/09/19 1633 99 F (37.2 C)     Temp Source 04/09/19 1633 Oral     SpO2 04/09/19 1633 99 %     Weight 04/09/19 1631 170 lb (77.1 kg)     Height 04/09/19 1631 '5\' 6"'$  (1.676 m)     Head Circumference --      Peak Flow --      Pain Score 04/09/19 1631 5  Pain Loc --      Pain Edu? --      Excl. in [place]? --     Constitutional: Alert and oriented. Well appearing and in no acute distress. Eyes: Conjunctivae are normal.  Head: Atraumatic. Nose: No congestion/rhinnorhea. Mouth/Throat: Mucous membranes are moist.   Neck:  supple no lymphadenopathy noted Cardiovascular: Normal rate, regular rhythm. Heart sounds are normal, blood pressure increased to 202/107 Respiratory: Normal respiratory effort.  No retractions, lungs c t a  Abd: soft nontender bs normal all 4 quad, no pulsatile masses noted GU: deferred Musculoskeletal: FROM all extremities, warm and well perfused Neurologic:  Normal speech and language.  Skin:  Skin is warm, dry and intact. No rash noted. Psychiatric: Mood and affect are normal. Speech and behavior are normal.  ____________________________________________   LABS (all labs ordered are listed, but only abnormal results are displayed)  Labs Reviewed  BASIC METABOLIC PANEL - Abnormal; Notable for the following components:      Result Value   Glucose, Bld 102 (*)    All other components within normal limits  CBC - Abnormal; Notable for the following components:   Hemoglobin 11.6 (*)    HCT 35.8 (*)    RDW  15.6 (*)    Platelets 488 (*)    All other components within normal limits  NOVEL CORONAVIRUS, NAA (HOSPITAL ORDER, SEND-OUT TO REF LAB)  TROPONIN I   ____________________________________________   ____________________________________________  RADIOLOGY  Chest x-ray is normal CTA aorta chest abdomen pelvis is normal  ____________________________________________   PROCEDURES  Procedure(s) performed: Nitroglycerin  Procedures    ____________________________________________   INITIAL IMPRESSION / ASSESSMENT AND PLAN / ED COURSE  Pertinent labs & imaging results that were available during my care of the patient were reviewed by me and considered in my medical decision making (see chart for details).   Patient is a 53 year old female presents emergency department with acute onset of chest pain, shortness of breath and diaphoresis.  Patient's had upper back pain for 2 days.  History of stents in March, history of AAA repair in March.  Physical exam is alert and oriented.  Lungs are clear to all station.  Heart sounds are normal.  No pulsatile masses noted in the abdomen.  Discussed with Dr. Jacqualine Code. Chest x-ray ordered CTA aorta chest abdomen pelvis Labs CBC, met B, troponin  DDX: Nonspecific chest pain, acute MI, aortic aneurysm, htn emergency  ----------------------------------------- 7:01 PM on 04/09/2019 ----------------------------------------- CBC has a slightly lowered H&H, basic metabolic panel is normal, troponins normal, COVID 19 test is pending.  Advised the hospitalist Dr. Leslye Peer and Heather Dy NP patient needs to be admitted due to the chest pain and her history.  They agreed to accept care of patient.   As part of my medical decision making, I reviewed the following data within the [facility] notes reviewed and incorporated, Labs reviewed see above, EKG interpreted NSR, Old EKG reviewed, Old chart reviewed, Radiograph reviewed chest  x-ray normal, CTA aorta is normal, Discussed with admitting physician hospitalist, Evaluated by EM attending Dr. Jacqualine Code, Notes from prior ED visits and [place] Controlled Substance Database  ____________________________________________   FINAL CLINICAL IMPRESSION(S) / ED DIAGNOSES  Final diagnoses:  Chest pain, unspecified type  Hypertension, unspecified type      NEW MEDICATIONS STARTED DURING THIS VISIT:  New Prescriptions   No medications on file     Note:  This document was prepared using Dragon voice recognition software and may include  unintentional dictation errors.    Versie Starks, PA-C 04/09/19 Zollie Beckers, MD 04/10/19 [phone]  Medical screening examination/treatment/procedure(s) were conducted as a shared visit with non-physician practitioner(s) and myself.  I personally evaluated the patient during the encounter.      Delman Kitten, MD 04/10/19 [phone]

## 2019-04-10 DIAGNOSIS — R072 Precordial pain: Secondary | ICD-10-CM

## 2019-04-10 DIAGNOSIS — I160 Hypertensive urgency: Secondary | ICD-10-CM

## 2019-04-10 LAB — TROPONIN I
Troponin I: <0.03 ng/mL (ref <0.03)
Troponin I: <0.03 ng/mL (ref <0.03)

## 2019-04-10 LAB — LIPID PANEL
Cholesterol: 200 mg/dL (ref 0–200)
HDL: 55 mg/dL (ref >40)
LDL Cholesterol: 129 mg/dL — ABNORMAL HIGH (ref 0–99)
Total CHOL/HDL Ratio: 3.6 RATIO
Triglycerides: 82 mg/dL (ref <150)
VLDL: 16 mg/dL (ref 0–40)

## 2019-04-10 LAB — TSH: TSH: 4.946 u[IU]/mL — ABNORMAL HIGH (ref 0.350–4.500)

## 2019-04-10 LAB — T4, FREE: Free T4: 0.85 ng/dL (ref 0.61–1.12)

## 2019-04-10 MED ORDER — METOPROLOL SUCCINATE ER 50 MG PO TB24: 75.0000 mg | ORAL_TABLET | Freq: Every day | ORAL | Status: DC

## 2019-04-10 MED ORDER — METOPROLOL SUCCINATE ER 50 MG PO TB24
75.0000 mg | ORAL_TABLET | Freq: Every day | ORAL | Status: DC
  Administered 2019-04-10 – 2019-04-11 (×2): 75 mg via ORAL
  Filled 2019-04-10 (×2): qty 1

## 2019-04-10 MED ORDER — PANTOPRAZOLE SODIUM 40 MG PO TBEC
40.0000 mg | DELAYED_RELEASE_TABLET | Freq: Every day | ORAL | Status: DC
  Administered 2019-04-10 – 2019-04-11 (×2): 40 mg via ORAL
  Filled 2019-04-10 (×2): qty 1

## 2019-04-10 NOTE — Consult Note (Addendum)
Cardiology Consultation:   Patient ID: Heather Mahoney MRN: [id number]; DOB: 09/16/1966  Admit date: 04/09/2019 Date of Consult: 04/10/2019  Primary Care Provider: Patient, No Pcp Per Primary Cardiologist: Heather Rogue, MD  Primary Electrophysiologist:  None   Patient Profile:   Heather Mahoney is a 53 y.o. female with a hx of CAD s/p cardiac catheterization (01/13/2019) and DES x2 to the proximal and distal RCA  With post procedural course complicated by L groin hematoma, orthostasis/ syncope, frequent PVCs, AAA s/p endovascular repair along with right femoral endarterectomy due to ischemic right lower extremity and 07/2018 followed by vascular surgery, chest pain, hypertension, hyperlipidemia, and tobacco abuse (quit 07/2018) who is being seen today for the evaluation of chest pain at the request of the [facility].  History of Present Illness:   [name] is a 53 year old female with PMH as above.  She was previously followed by Dr. Lynnae Mahoney of [facility] cardiology for possible syncope in 2017 and more recently established with Dr. Rockey Mahoney.    2017 echo showed EF 55 to 60% and no R WMA with trivial pericardial effusion. 2017 Myoview showed no significant ischemia, EF 46%, rare PVCs, and was ruled low risk scan.  She has a history of symptomatic ectopy with outpatient cardiac monitoring 01/2016 showed overall NSR with average heart rate 88 bpm (range 57 to 136 bpm).  Percent of the beats were tachycardic, 4 isolated PACs were noted along with 14,946 isolated PVCs and 53 ventricular couplets, representing 12% burden. She also has a history of uncontrolled blood pressure and was admitted 07/2018 with pelvic pain and BP> 200/110, during which time she was found to have an infrarenal AAA that had grown from 3.3 cm to 4.1 cm in 2 years time and underwent endovascular repair on 08/13/2018.   She was recently diagnosed with severe single vessel CAD after a cardiac CT 12/2018 showed dynamically significant  stenosis of the mid RCA. On 01/13/2019, she underwent LHC and PCI with DES x2 to the proximal and distal RCA 01/13/2019.  She unfortunately developed left lower quadrant/flank pain, hypotension, and bradycardia with stat CT of the abdomen and pelvis revealing a hematoma, extending from the left groin and up to the left flank.  A small amount of bleeding was also noted in the right groin. She was monitored overnight and discharged the next day.   On 04/09/19, she presented to [facility] with c/o chest pain, diaphoresis, HA, and SOB that started ~2:12PM-2:30PM when at work at [employer]. She reportedly was putting clothes on plastic cannulas and then pushing a cart when she felt shortness of breath and associated chest pain with diaphoresis.  She went to the [facility] pharmacist pharmacist for a  BP check and found it was elevated at 198/128.  She described the chest pain as "when you have a cold," and burning located along her anterior chest/sternum with associated back pain that occurred between her shoulder blades.  She reported that this pain 10/10 in severity and lasting for several minutes then resolved with the back pain. This chest pain was associated with a HA that has persisted after her chest pain resolved. Exacerbating factors were listed as exertion and alleviating factors as time. She did admit to using more salt on her food than she probably should on a daily basis. She denied symptoms of orthopnea and only noted intermitted pedal edema after standing all day and that resolved with elevation of her feet. She noted ongoing issues with claudication, which she was followed  for by vascular surgery. She reported taking amlodipine and nitro only when she noted her BP elevated. She also stated that she had accidentally taken her Cozaar as losartan 100mg  daily, rather than 25mg  daily, as she had misread the bottle. She otherwise reported medication compliance. She continued to not smoke cigarettes but did admit to smoking  marijuana twice weekly to help with her insomnia. She denied any alcohol use. In the ED, vitals were significant for BP elevated at 202/107, HR 73. Labs showed potassium 3.5, Na 135, Cr 0.67, BUN 10, troponin negative x3, Hgb 11.6. EKG showed NSR and was without acute ST/T changes from previous EKGs.  Lipid panel showed LDL 129 with total cholesterol 200. CTA as below r/o dissection. She was admitted for further evaluation and management.    Past Medical History:  Diagnosis Date   CAD (coronary artery disease)    a. PCI to proximal and distal RCA 3/26   GERD (gastroesophageal reflux disease)    Heart murmur    MR   Hiatal hernia    Hyperlipidemia    Hypertension    Tobacco abuse    a. Started at age 89, quit during 3 pregnancies. b. 1 PPD for a 40 pack-year hx (2019)     Past Surgical History:  Procedure Laterality Date   BREAST SURGERY  1986   I&D for 'milk duct'   CORONARY STENT INTERVENTION N/A 01/13/2019   Procedure: CORONARY STENT INTERVENTION;  Surgeon: Heather Bush, MD;  Location: [place] [facility];  Service: Cardiovascular;  Laterality: N/A;   EMBOLECTOMY  08/13/2018   Procedure: Right SFA and profunda femoris Fogarty embolectomy 3  Fogarty embolectomy balloon;  Surgeon: Heather Huxley, MD;  Location: [facility];  Service: Vascular;;   ENDARTERECTOMY FEMORAL Right 08/13/2018   Procedure: Right common femoral, profunda femoris, and superficial femoral artery endarterectomies and patch angioplasty ;  Surgeon: Heather Huxley, MD;  Location: [facility];  Service: Vascular;  Laterality: Right;   ENDOVASCULAR REPAIR/STENT GRAFT N/A 08/13/2018   Procedure: ENDOVASCULAR REPAIR/STENT GRAFT;  Surgeon: Heather Cabal, MD;  Location: [place] [facility];  Service: Cardiovascular;  Laterality: N/A;   LEFT HEART CATH AND CORONARY ANGIOGRAPHY Left 01/13/2019   Procedure: LEFT HEART CATH AND CORONARY ANGIOGRAPHY;  Surgeon: Heather Merritts, MD;  Location: [place] [facility];   Service: Cardiovascular;  Laterality: Left;   SALPINGOOPHORECTOMY Left 2000     Home Medications:  Prior to Admission medications   Medication Sig Start Date End Date Taking? Authorizing Provider  amLODipine (NORVASC) 5 MG tablet Take 7.5 mg by mouth daily.   Yes [provider]  aspirin 81 MG EC tablet Take 1 tablet (81 mg total) by mouth daily. 01/14/19  Yes Visser, Jacquelyn D, PA-C  atorvastatin (LIPITOR) 80 MG tablet Take 1 tablet (80 mg total) by mouth daily. 01/14/19  Yes Marrianne Mood D, PA-C  clopidogrel (PLAVIX) 75 MG tablet Take 1 tablet (75 mg total) by mouth daily. 01/14/19  Yes Marrianne Mood D, PA-C  docusate sodium (COLACE) 100 MG capsule Take 1 capsule (100 mg total) by mouth daily. 08/16/18  Yes Vaughan Basta, MD  losartan (COZAAR) 100 MG tablet Take 100 mg by mouth daily.   Yes [provider]  metoprolol succinate (TOPROL-XL) 25 MG 24 hr tablet Take 1 tablet (25 mg total) by mouth daily. 01/14/19  Yes Visser, Jacquelyn D, PA-C  losartan (COZAAR) 25 MG tablet Take 1 tablet (25 mg total) by mouth daily. Patient not  taking: Reported on 04/09/2019 01/14/19   Marrianne Mood D, PA-C  nitroGLYCERIN (NITROSTAT) 0.4 MG SL tablet Place 1 tablet (0.4 mg total) under the tongue every 5 (five) minutes as needed for chest pain. 01/26/19 04/26/19  Heather Merritts, MD  oxyCODONE-acetaminophen (PERCOCET/ROXICET) 5-325 MG tablet Take 1 tablet by mouth every 6 (six) hours as needed for moderate pain or severe pain. Patient not taking: Reported on 04/01/2019 11/11/18   Victorino Dike, FNP    Inpatient Medications: Scheduled Meds:  amLODipine  7.5 mg Oral Daily   aspirin EC  81 mg Oral Daily   atorvastatin  80 mg Oral Daily   clopidogrel  75 mg Oral Daily   docusate sodium  100 mg Oral Daily   enoxaparin (LOVENOX) injection  40 mg Subcutaneous Q24H   losartan  100 mg Oral Daily   metoprolol succinate  25 mg Oral Daily   pantoprazole  40 mg Oral  Daily   sodium chloride flush  3 mL Intravenous Once   Continuous Infusions:  PRN Meds: acetaminophen, nitroGLYCERIN, ondansetron (ZOFRAN) IV  Allergies:    Allergies  Allergen Reactions   Strawberry Extract Hives and Swelling   Strawberry Flavor Rash    Social History:   Social History   Socioeconomic History   Marital status: Single    Spouse name: Not on file   Number of children: Not on file   Years of education: Not on file   Highest education level: Not on file  Occupational History   Not on file  Social Needs   Financial resource strain: Not on file   Food insecurity    Worry: Not on file    Inability: Not on file   Transportation needs    Medical: Not on file    Non-medical: Not on file  Tobacco Use   Smoking status: Former Smoker    Packs/day: 0.25    Years: 38.00    Pack years: 9.50    Types: Cigarettes    Quit date: 08/10/2018    Years since quitting: 0.6   Smokeless tobacco: Never Used  Substance and Sexual Activity   Alcohol use: Yes    Comment: occassional   Drug use: Yes    Types: Marijuana    Comment: smokes Marijuana 'when I cant sleep, maybe once or twice a week'   Sexual activity: Yes  Lifestyle   Physical activity    Days per week: Not on file    Minutes per session: Not on file   Stress: Not on file  Relationships   Social connections    Talks on phone: Not on file    Gets together: Not on file    Attends religious service: Not on file    Active member of club or organization: Not on file    Attends meetings of clubs or organizations: Not on file    Relationship status: Not on file   Intimate partner violence    Fear of current or ex partner: Not on file    Emotionally abused: Not on file    Physically abused: Not on file    Forced sexual activity: Not on file  Other Topics Concern   Not on file  Social History Narrative   Not on file    Family History:    Family History  Problem Relation Age of  Onset   Diabetes Mother    Hypertension Mother    Hyperlipidemia Mother    Heart disease Mother  CABG X 4   Asthma Mother    Sarcoidosis Mother        In remission   Heart attack Mother    Diabetes Maternal Grandmother    Heart disease Maternal Grandmother    Hyperlipidemia Maternal Grandmother      ROS:  Please see the history of present illness.  Review of Systems  Constitutional: Negative for chills and fever.  Respiratory: Positive for shortness of breath.        SOB with exertion  Cardiovascular: Positive for chest pain, palpitations, claudication and leg swelling. Negative for orthopnea.       Uses 6 pillows since having her first child for comfort only  Pedal edema, improves with leg elevation  Pleuritic chest pain with exertion, 10/10 and lasting minutes, sternum TTP after an episode, occasional pain with changing position as well  Claudication with workup per vascular surgery  Gastrointestinal: Positive for abdominal pain. Negative for blood in stool and melena.       Now resolved, reported LLQ abdominal pain last week  Musculoskeletal: Positive for back pain.       Upper back pain with chest pain and between her shoulder blades  Neurological: Positive for headaches. Negative for focal weakness and loss of consciousness.       Headaches follow the chest pain  Psychiatric/Behavioral: Positive for substance abuse. The patient has insomnia.        Marijuana to aid with insomnia Stopped smoking 07/2018    All other ROS reviewed and negative.     Physical Exam/Data:   Vitals:   04/09/19 2200 04/09/19 2226 04/10/19 0520 04/10/19 0754  BP: (!) 147/99 (!) 152/107 126/89 (!) 145/100  Pulse: 91 (!) 103 80 85  Resp: 14 16 18 18   Temp:  98.7 F (37.1 C) 98.5 F (36.9 C) 97.8 F (36.6 C)  TempSrc:  Oral Oral Oral  SpO2: 100% 99% 99% 100%  Weight:      Height:        Intake/Output Summary (Last 24 hours) at 04/10/2019 0808 Last data filed at  04/10/2019 0500 Gross per 24 hour  Intake --  Output 600 ml  Net -600 ml   Filed Weights   04/09/19 1631  Weight: 77.1 kg   Body mass index is 27.44 kg/m.  General:  Well nourished, well developed, in no acute distress. Eating breakfast HEENT: normal Neck: no JVD Vascular: No carotid bruits; radial pulses 2+ bilaterally   Cardiac:  normal S1, S2; RRR; no murmur  Lungs:  clear to auscultation bilaterally, no wheezing, rhonchi or rales  Abd: soft, nontender, no hepatomegaly  Ext: no b/l lower extremity edema Musculoskeletal:  No deformities, BUE and BLE strength normal and equal Skin: warm and dry  Neuro:  CNs 2-12 intact, no focal abnormalities noted Psych:  Normal affect   EKG:  The EKG was personally reviewed and demonstrates:  6/20 NSR, LVH, 77bpm. Repeat EKG today 6/21 NSR showed non-specific ST/T changes consistent with previous EKGs and at 69bpm Telemetry:  Telemetry was personally reviewed and demonstrates:  SR 60-70s  Relevant CV Studies:   02/23/2019 TTE  1. The left ventricle has low normal systolic function, with an ejection fraction of 50-55%. The cavity size was normal. There is mildly increased left ventricular wall thickness. Unable to exclude mild hypokinesis of the inferior wall. Left ventricular  diastolic Doppler parameters are consistent with impaired relaxation.  2. The right ventricle has normal systolic function. The cavity was normal. There is  no increase in right ventricular wall thickness. Unable to estimate RVSP.   Laboratory Data:  Chemistry Recent Labs  Lab 04/09/19 1644  NA 135  K 3.5  CL 102  CO2 23  GLUCOSE 102*  BUN 10  CREATININE 0.67  CALCIUM 9.1  GFRNONAA >60  GFRAA >60  ANIONGAP 10    No results for input(s): PROT, ALBUMIN, AST, ALT, ALKPHOS, BILITOT in the last 168 hours. Hematology Recent Labs  Lab 04/09/19 1644  WBC 10.5  RBC 4.42  HGB 11.6*  HCT 35.8*  MCV 81.0  MCH 26.2  MCHC 32.4  RDW 15.6*  PLT 488*    Cardiac Enzymes Recent Labs  Lab 04/09/19 1644 04/09/19 2242 04/10/19 0501  TROPONINI <0.03 <0.03 <0.03   No results for input(s): TROPIPOC in the last 168 hours.  BNPNo results for input(s): BNP, PROBNP in the last 168 hours.  DDimer No results for input(s): DDIMER in the last 168 hours.  Radiology/Studies:  Dg Chest Port 1 View  Result Date: 04/09/2019 CLINICAL DATA:  Chest pain. EXAM: PORTABLE CHEST 1 VIEW COMPARISON:  Chest CT 01/04/2019 FINDINGS: Cardiomediastinal silhouette is normal. Mediastinal contours appear intact. There is no evidence of focal airspace consolidation, pleural effusion or pneumothorax. Osseous structures are without acute abnormality. Soft tissues are grossly normal. IMPRESSION: No active disease. Electronically Signed   By: Fidela Salisbury M.D.   On: 04/09/2019 17:07   Ct Angio Chest/abd/pel For Dissection W And/or Wo Contrast  Result Date: 04/09/2019 CLINICAL DATA:  Central chest pain EXAM: CT ANGIOGRAPHY CHEST, ABDOMEN AND PELVIS TECHNIQUE: Multidetector CT imaging through the chest, abdomen and pelvis was performed using the standard protocol during bolus administration of intravenous contrast. Multiplanar reconstructed images and MIPs were obtained and reviewed to evaluate the vascular anatomy. CONTRAST:  131mL ISOVUE-370 IOPAMIDOL (ISOVUE-370) INJECTION 76% COMPARISON:  CT 01/13/2019, 08/10/2018 FINDINGS: CTA CHEST FINDINGS Cardiovascular: Non contrasted images of the chest demonstrate no intramural hematoma. Moderate aortic atherosclerosis. Coronary vascular calcifications. Aorta is nonaneurysmal. No dissection is seen. Heart size within normal limits. Trace pericardial effusion. Common origin of the right brachiocephalic and left common carotid artery. Mediastinum/Nodes: Midline trachea. No thyroid mass. 12 mm left paratracheal lymph node. Esophagus within normal limits. Lungs/Pleura: Mild apically emphysema. No acute consolidation, effusion or  pneumothorax. Musculoskeletal: No acute or suspicious osseous abnormality. Review of the MIP images confirms the above findings. CTA ABDOMEN AND PELVIS FINDINGS VASCULAR Aorta: Infrarenal aortoiliac stent graft with patency. No dissection. Celiac: Slight narrowing at the origin with distal patency. SMA: Patent without evidence of aneurysm, dissection, vasculitis or significant stenosis. Renals: Single right and single left renal arteries show no significant stenosis. IMA: Not clearly identified, possibly chronically occluded. Inflow: Moderate irregular narrowing at the origins of both internal iliac vessels. Common iliac limbs of the stent graft are widely patent. External iliac vessels demonstrate normal flow enhancement and no significant stenosis or occlusion. Common femoral arteries are also patent. Veins: No obvious venous abnormality within the limitations of this arterial phase study. Review of the MIP images confirms the above findings. NON-VASCULAR Hepatobiliary: No focal liver abnormality is seen. No gallstones, gallbladder wall thickening, or biliary dilatation. Pancreas: Unremarkable. No pancreatic ductal dilatation or surrounding inflammatory changes. Spleen: Normal in size without focal abnormality. Adrenals/Urinary Tract: Adrenal glands are unremarkable. Kidneys are normal, without renal calculi, focal lesion, or hydronephrosis. Bladder is unremarkable. Stomach/Bowel: Stomach is within normal limits. Appendix appears normal. No evidence of bowel wall thickening, distention, or inflammatory changes. Lymphatic: No significantly enlarged  lymph nodes. Reproductive: Uterus and bilateral adnexa are unremarkable. Other: Negative for free air or free fluid Musculoskeletal: No acute or suspicious osseous abnormality Review of the MIP images confirms the above findings. IMPRESSION: 1. Negative for acute aortic dissection or aneurysm. Status post aortoiliac stent graft with widely patent graft. 2. Mild apical  emphysema 3. No CT evidence for acute intra-abdominal or pelvic abnormality Electronically Signed   By: Donavan Foil M.D.   On: 04/09/2019 18:27    Assessment and Plan:   Chest pain with history of CAD  --No current CP. Earlier c/o CP, HA, and back pain with diaphoresis and in the setting of elevated BP with residual (current) headache.  -- S/p 3/26 cardiac catheterization DES x2 to the proximal and distal RCA --CTA 6/20 negative for acute aortic dissection or aneurysm. No signs of RAS on CTA. Echo as above with normal EF. - Troponin negative x3. EKG without acute ST/T changes. -- Suspect that CP may be in the setting of uncontrolled BP as above. Will also check TSH given c/o tachycardia and PVCs. -- No plan for further ischemic workup with cardiac catheterization this admission.  -- Will plan to adjust medications for more optimal BP support. Increase Toprol to 75mg  daily and losartan to 100mg  daily, which patient is already taking at home. Will plan for amlodipine 7.5mg  daily, rather than just at times she notices elevated BP. Continue medical management with DAPT, ASA and Plavix 75mg  daily. Nitro as needed for CP. Continue aggressive secondary prevention with statin therapy. Will plan for outpatient follow-up after discharge with primary cardiologist. Lifestyle and dietary changes, including reduced sodium intake, also advised.  Hypokalemia -- Replete with goal 4.0. Check Mg -- Repeat AM BMET  Essential hypertension -Presented to ED with uncontrolled BP and associated CP, HA, back pain as above in HPI. History of ED visits for hypertensive urgency.  --Plan for amlodipine 7.5mg  qd, losartan 100mg  daily, Toprol 75mg  daily as above.  Mixed hyperlipidemia --Updated lipids show total cholesterol 200 with LDL 129  --LDL goal <70 --Continue statin therapy with atorvastatin 80mg  daily. Consider Zetia.  Anemia, chronic --Hgb 11.6 --Daily CBC to monitor  History of tobacco use  disorder --Quit smoking and continues to avoid tobacco. Does admit to TSH use. --CTA shows new mild apical emphysema --Continued cessation advised, as well as cessation of TSH  PAD/AAA --CTA without evidence for acute intra-abdominal or pelvic abnormality  --No evidence of RAS as above --Followed by vascular surgery  For questions or updates, please contact [facility] Please consult [email or web address] for contact info under     Signed, Arvil Chaco, PA-C  04/10/2019 8:08 AM    I have personally seen and examined this patient with Marrianne Mood, PA-C. I agree with the assessment and plan as outlined above. She has a history of CAD with recent coronary stenting in March 2020, AAA, PAD, HTN, HLD and tobacco abuse. She is admitted with hypertensive urgency with chest pain and headache. She has been taking Cozaar 100 daily, Toprol 25 mg daily at home. Occasionally using Norvasc. She was supposed to be on Cozaar 25 and Toprol 75 mg at home but was taking incorrectly. Chest pain now resolved with control of BP. EKG without ischemic changes. I personally reviewed her EKG and it shows sinus rhythm with ST elevation in the lateral, inferior and anterior leads, unchanged from old EKG and likely representing LVH. Troponin negative x 3.  All labs reviewed by me.   My exam:  General: Well developed, well nourished, NAD  HEENT: OP clear, mucus membranes moist  SKIN: warm, dry. No rashes.  Neuro: No focal deficits  Musculoskeletal: Muscle strength 5/5 all ext  Psychiatric: Mood and affect normal  Neck: No JVD, no carotid bruits, no thyromegaly, no lymphadenopathy.  Lungs:Clear bilaterally, no wheezes, rhonci, crackles  Cardiovascular: Regular rate and rhythm. No murmurs, gallops or rubs.  Abdomen:Soft. Bowel sounds present. Non-tender.  Extremities: No lower extremity edema. Pulses are 2 + in the bilateral DP/PT.   Plan: Chest pain in setting of hypertensive crisis: Chest pain resolved  with control of BP. No objective evidence of ischemia. CTA abdomen last night with no evidence of renal artery stenosis. I would titrate BP meds today for better BP control. Continue Cozaar 100 mg daily, start Norvasc 7.5 mg daily and increase Toprol back to her expected home dosage of 75 mg daily. Follow BP and HR today.  No ischemic workup is necessary. Continue current therapy for CAD including ASA, Plavix and statin.   Lauree Chandler 04/10/2019 10:08 AM

## 2019-04-10 NOTE — Progress Notes (Signed)
[facility] NAME: Heather Mahoney    MR#:  [id number]  DATE OF BIRTH:  07-Oct-1966  SUBJECTIVE:  CHIEF COMPLAINT:   Chief Complaint  Patient presents with  . Chest Pain   - still has headache - chest pain is better now  REVIEW OF SYSTEMS:  Review of Systems  Constitutional: Negative for chills and fever.  HENT: Negative for congestion, ear discharge, hearing loss and nosebleeds.   Eyes: Negative for blurred vision and double vision.  Respiratory: Negative for cough, shortness of breath and wheezing.   Cardiovascular: Negative for chest pain and palpitations.  Gastrointestinal: Negative for abdominal pain, constipation, diarrhea, nausea and vomiting.  Genitourinary: Negative for dysuria.  Neurological: Positive for headaches. Negative for dizziness, focal weakness, seizures and weakness.  Psychiatric/Behavioral: Negative for depression.    DRUG ALLERGIES:   Allergies  Allergen Reactions  . Strawberry Extract Hives and Swelling  . Strawberry Flavor Rash    VITALS:  Blood pressure (!) 145/100, pulse 85, temperature 97.8 F (36.6 C), temperature source Oral, resp. rate 18, height 5\' 6"  (1.676 m), weight 77.1 kg, SpO2 100 %.  PHYSICAL EXAMINATION:  Physical Exam   GENERAL:  53 y.o.-year-old patient lying in the bed with no acute distress.  EYES: Pupils equal, round, reactive to light and accommodation. No scleral icterus. Extraocular muscles intact.  HEENT: Head atraumatic, normocephalic. Oropharynx and nasopharynx clear.  NECK:  Supple, no jugular venous distention. No thyroid enlargement, no tenderness.  LUNGS: Normal breath sounds bilaterally, no wheezing, rales,rhonchi or crepitation. No use of accessory muscles of respiration.  CARDIOVASCULAR: S1, S2 normal. No   rubs, or gallops. 2/6 systolic murmur present ABDOMEN: Soft, nontender, nondistended. Bowel sounds present. No organomegaly or mass.  EXTREMITIES: No pedal  edema, cyanosis, or clubbing.  NEUROLOGIC: Cranial nerves II through XII are intact. Muscle strength 5/5 in all extremities. Sensation intact. Gait not checked.  PSYCHIATRIC: The patient is alert and oriented x 3.  SKIN: No obvious rash, lesion, or ulcer.    LABORATORY PANEL:   CBC Recent Labs  Lab 04/09/19 1644  WBC 10.5  HGB 11.6*  HCT 35.8*  PLT 488*   ------------------------------------------------------------------------------------------------------------------  Chemistries  Recent Labs  Lab 04/09/19 1644  NA 135  K 3.5  CL 102  CO2 23  GLUCOSE 102*  BUN 10  CREATININE 0.67  CALCIUM 9.1   ------------------------------------------------------------------------------------------------------------------  Cardiac Enzymes Recent Labs  Lab 04/10/19 1043  TROPONINI <0.03   ------------------------------------------------------------------------------------------------------------------  RADIOLOGY:  Dg Chest Port 1 View  Result Date: 04/09/2019 CLINICAL DATA:  Chest pain. EXAM: PORTABLE CHEST 1 VIEW COMPARISON:  Chest CT 01/04/2019 FINDINGS: Cardiomediastinal silhouette is normal. Mediastinal contours appear intact. There is no evidence of focal airspace consolidation, pleural effusion or pneumothorax. Osseous structures are without acute abnormality. Soft tissues are grossly normal. IMPRESSION: No active disease. Electronically Signed   By: Fidela Salisbury M.D.   On: 04/09/2019 17:07   Ct Angio Chest/abd/pel For Dissection W And/or Wo Contrast  Result Date: 04/09/2019 CLINICAL DATA:  Central chest pain EXAM: CT ANGIOGRAPHY CHEST, ABDOMEN AND PELVIS TECHNIQUE: Multidetector CT imaging through the chest, abdomen and pelvis was performed using the standard protocol during bolus administration of intravenous contrast. Multiplanar reconstructed images and MIPs were obtained and reviewed to evaluate the vascular anatomy. CONTRAST:  16mL ISOVUE-370 IOPAMIDOL  (ISOVUE-370) INJECTION 76% COMPARISON:  CT 01/13/2019, 08/10/2018 FINDINGS: CTA CHEST FINDINGS Cardiovascular: Non contrasted images of the chest demonstrate no intramural  hematoma. Moderate aortic atherosclerosis. Coronary vascular calcifications. Aorta is nonaneurysmal. No dissection is seen. Heart size within normal limits. Trace pericardial effusion. Common origin of the right brachiocephalic and left common carotid artery. Mediastinum/Nodes: Midline trachea. No thyroid mass. 12 mm left paratracheal lymph node. Esophagus within normal limits. Lungs/Pleura: Mild apically emphysema. No acute consolidation, effusion or pneumothorax. Musculoskeletal: No acute or suspicious osseous abnormality. Review of the MIP images confirms the above findings. CTA ABDOMEN AND PELVIS FINDINGS VASCULAR Aorta: Infrarenal aortoiliac stent graft with patency. No dissection. Celiac: Slight narrowing at the origin with distal patency. SMA: Patent without evidence of aneurysm, dissection, vasculitis or significant stenosis. Renals: Single right and single left renal arteries show no significant stenosis. IMA: Not clearly identified, possibly chronically occluded. Inflow: Moderate irregular narrowing at the origins of both internal iliac vessels. Common iliac limbs of the stent graft are widely patent. External iliac vessels demonstrate normal flow enhancement and no significant stenosis or occlusion. Common femoral arteries are also patent. Veins: No obvious venous abnormality within the limitations of this arterial phase study. Review of the MIP images confirms the above findings. NON-VASCULAR Hepatobiliary: No focal liver abnormality is seen. No gallstones, gallbladder wall thickening, or biliary dilatation. Pancreas: Unremarkable. No pancreatic ductal dilatation or surrounding inflammatory changes. Spleen: Normal in size without focal abnormality. Adrenals/Urinary Tract: Adrenal glands are unremarkable. Kidneys are normal, without  renal calculi, focal lesion, or hydronephrosis. Bladder is unremarkable. Stomach/Bowel: Stomach is within normal limits. Appendix appears normal. No evidence of bowel wall thickening, distention, or inflammatory changes. Lymphatic: No significantly enlarged lymph nodes. Reproductive: Uterus and bilateral adnexa are unremarkable. Other: Negative for free air or free fluid Musculoskeletal: No acute or suspicious osseous abnormality Review of the MIP images confirms the above findings. IMPRESSION: 1. Negative for acute aortic dissection or aneurysm. Status post aortoiliac stent graft with widely patent graft. 2. Mild apical emphysema 3. No CT evidence for acute intra-abdominal or pelvic abnormality Electronically Signed   By: Donavan Foil M.D.   On: 04/09/2019 18:27    EKG:   Orders placed or performed during the hospital encounter of 04/09/19  . EKG 12-Lead  . EKG 12-Lead  . ED EKG  . ED EKG  . EKG 12-Lead (at 6am)  . EKG 12-Lead (at 6am)    ASSESSMENT AND PLAN:   53 year old female with past medical history significant for CAD status post PCI to RCA in March 2020, AAA status post endovascular repair in October 2019, previous cardiac cath complicated with left groin hematoma, PAD presents to hospital secondary to chest pain.  1.  Chest pain-angina secondary to elevated blood pressure likely. -Known history of CAD, last stents in March 2022 distal and proximal RCA.  Appreciate cardiology consult. -CT angiogram negative for any dissection or pulmonary embolism.  Troponins are negative.  No acute EKG changes. -Chest pain is resolved.  Continue to monitor carefully.  No further cardiac intervention is planned at this time.  2.  Hypertensive urgency-blood pressure is improving.  Currently on Norvasc, losartan and Toprol.  Doses have been adjusted  3.  PAD-currently on aspirin and Plavix.  Outpatient follow-up with vascular for angiogram.  However pulses in both feet  4.  CAD-stable.  Appreciate  cardiology input.  Continue cardiac medications  5.  DVT prophylaxis-Lovenox  Independent at baseline   All the records are reviewed and case discussed with Care Management/Social Workerr. Management plans discussed with the patient, family and they are in agreement.  CODE STATUS: Full Code  TOTAL TIME TAKING CARE OF THIS PATIENT: 38 minutes.   POSSIBLE D/C IN 1-2 DAYS, DEPENDING ON CLINICAL CONDITION.   Gladstone Lighter M.D on 04/10/2019 at 12:15 PM  Between 7am to 6pm - Pager - [phone]  After 6pm go to [email or web address] - password [email or web address] [facility]  Office  [phone]  CC: Primary care physician; Patient, No Pcp Per

## 2019-04-10 NOTE — Plan of Care (Signed)
  Problem: Clinical Measurements: Goal: Ability to maintain clinical measurements within normal limits will improve Outcome: Progressing Goal: Diagnostic test results will improve Outcome: Progressing   Problem: Pain Managment: Goal: General experience of comfort will improve Outcome: Progressing   Problem: Safety: Goal: Ability to remain free from injury will improve Outcome: Progressing   Problem: Activity: Goal: Ability to tolerate increased activity will improve Outcome: Progressing   Problem: Cardiac: Goal: Ability to achieve and maintain adequate cardiovascular perfusion will improve Outcome: Progressing

## 2019-04-11 DIAGNOSIS — R072 Precordial pain: Secondary | ICD-10-CM | POA: Diagnosis not present

## 2019-04-11 LAB — BASIC METABOLIC PANEL
Anion gap: 12 (ref 5–15)
BUN: 15 mg/dL (ref 6–20)
CO2: 22 mmol/L (ref 22–32)
Calcium: 9.2 mg/dL (ref 8.9–10.3)
Chloride: 105 mmol/L (ref 98–111)
Creatinine, Ser: 0.65 mg/dL (ref 0.44–1.00)
GFR calc Af Amer: >60 mL/min (ref >60)
GFR calc non Af Amer: >60 mL/min (ref >60)
Glucose, Bld: 110 mg/dL — ABNORMAL HIGH (ref 70–99)
Potassium: 3.4 mmol/L — ABNORMAL LOW (ref 3.5–5.1)
Sodium: 139 mmol/L (ref 135–145)

## 2019-04-11 MED ORDER — AMLODIPINE BESYLATE 5 MG PO TABS: 7.5000 mg | ORAL_TABLET | Freq: Every day | ORAL | 2 refills | Status: DC

## 2019-04-11 MED ORDER — METOPROLOL SUCCINATE ER 25 MG PO TB24: 75.0000 mg | ORAL_TABLET | Freq: Every day | ORAL | 2 refills | Status: DC

## 2019-04-11 MED ORDER — EZETIMIBE 10 MG PO TABS
10.0000 mg | ORAL_TABLET | Freq: Every day | ORAL | Status: DC
  Administered 2019-04-11: 10 mg via ORAL
  Filled 2019-04-11: qty 1

## 2019-04-11 MED ORDER — EZETIMIBE 10 MG PO TABS: 10.0000 mg | ORAL_TABLET | Freq: Every day | ORAL | 2 refills | Status: DC

## 2019-04-11 NOTE — Progress Notes (Signed)
Progress Note  Patient Name: Heather Mahoney Date of Encounter: 04/11/2019  Primary Cardiologist: Rockey Situ  Subjective   No chest pain or SOB. BP improved to the 564P systolic.  Inpatient Medications    Scheduled Meds: . amLODipine  7.5 mg Oral Daily  . aspirin EC  81 mg Oral Daily  . atorvastatin  80 mg Oral Daily  . clopidogrel  75 mg Oral Daily  . docusate sodium  100 mg Oral Daily  . enoxaparin (LOVENOX) injection  40 mg Subcutaneous Q24H  . losartan  100 mg Oral Daily  . metoprolol succinate  75 mg Oral Daily  . pantoprazole  40 mg Oral Daily  . sodium chloride flush  3 mL Intravenous Once   Continuous Infusions:  PRN Meds: acetaminophen, nitroGLYCERIN, ondansetron (ZOFRAN) IV   Vital Signs    Vitals:   04/10/19 1703 04/10/19 1846 04/11/19 0439 04/11/19 0741  BP: (!) 144/99 116/77 (!) 142/96 (!) 135/97  Pulse: 81 71 70 66  Resp: 18 18 16 19   Temp:   97.9 F (36.6 C) 98 F (36.7 C)  TempSrc:   Oral Oral  SpO2: 99% 99% 98% 99%  Weight:      Height:        Intake/Output Summary (Last 24 hours) at 04/11/2019 3295 Last data filed at 04/10/2019 1700 Gross per 24 hour  Intake 480 ml  Output 1100 ml  Net -620 ml   Filed Weights   04/09/19 1631  Weight: 77.1 kg    Telemetry    SR with PVCs - Personally Reviewed  ECG    n/a - Personally Reviewed  Physical Exam   GEN: No acute distress.   Neck: No JVD. Cardiac: RRR, no murmurs, rubs, or gallops.  Respiratory: Clear to auscultation bilaterally.  GI: Soft, nontender, non-distended.   MS: No edema; No deformity. Neuro:  Alert and oriented x 3; Nonfocal.  Psych: Normal affect.  Labs    Chemistry Recent Labs  Lab 04/09/19 1644 04/11/19 0653  NA 135 139  K 3.5 3.4*  CL 102 105  CO2 23 22  GLUCOSE 102* 110*  BUN 10 15  CREATININE 0.67 0.65  CALCIUM 9.1 9.2  GFRNONAA >60 >60  GFRAA >60 >60  ANIONGAP 10 12     Hematology Recent Labs  Lab 04/09/19 1644  WBC 10.5  RBC 4.42  HGB  11.6*  HCT 35.8*  MCV 81.0  MCH 26.2  MCHC 32.4  RDW 15.6*  PLT 488*    Cardiac Enzymes Recent Labs  Lab 04/09/19 1644 04/09/19 2242 04/10/19 0501 04/10/19 1043  TROPONINI <0.03 <0.03 <0.03 <0.03   No results for input(s): TROPIPOC in the last 168 hours.   BNPNo results for input(s): BNP, PROBNP in the last 168 hours.   DDimer No results for input(s): DDIMER in the last 168 hours.   Radiology    Dg Chest Port 1 View  Result Date: 04/09/2019 IMPRESSION: No active disease. Electronically Signed   By: Fidela Salisbury M.D.   On: 04/09/2019 17:07   Ct Angio Chest/abd/pel For Dissection W And/or Wo Contrast  Result Date: 04/09/2019 IMPRESSION: 1. Negative for acute aortic dissection or aneurysm. Status post aortoiliac stent graft with widely patent graft. 2. Mild apical emphysema 3. No CT evidence for acute intra-abdominal or pelvic abnormality Electronically Signed   By: Donavan Foil M.D.   On: 04/09/2019 18:27    Cardiac Studies   2D Echo 02/2019: 1. The left ventricle has low  normal systolic function, with an ejection fraction of 50-55%. The cavity size was normal. There is mildly increased left ventricular wall thickness. Unable to exclude mild hypokinesis of the inferior wall. Left ventricular diastolic Doppler parameters are consistent with impaired relaxation.  2. The right ventricle has normal systolic function. The cavity was normal. There is no increase in right ventricular wall thickness. Unable to estimate RVSP. __________  [facility] 12/2018: Coronary dominance: Right dominant  Left mainstem:   Large vessel that bifurcates into the LAD and left circumflex, no significant disease noted  Left anterior descending (LAD):   Large vessel that extends to the apical region, diagonal branch 2 of moderate size, mild proximal LAD disease estimated at 30%  Left circumflex (LCx):  Large vessel with OM branch 2, mild mid left circumflex disease estimated at 40%, high OM  branches small bifurcating diffusely diseased 60% proximal disease  Right coronary artery (RCA):  Right dominant vessel with PL and PDA, moderate to large sized vessel 75% proximal napkin ring lesion,  with 80% distal stenosis circumferential  Left ventriculography: Left ventricular systolic function is normal, LVEF is estimated at 55-65%, there is no significant mitral regurgitation , no significant aortic valve stenosis  Final Conclusions:   Single-vessel disease notably of the RCA moderate to severe proximal  disease estimated 75% and distal RCA disease estimated at 80% There is disease of high OM vessel, small in caliber diffusely diseased Mild proximal LAD disease  Recommendations:  Case discussed with interventional cardiology, Dr. END Given functional study/cardiac CTA showing stenosis in RCA by FFR and given her symptoms concerning for unstable angina, will consider stenting of proximal RCA and distal RCA Medical management for disease on the left system ___________  PCI 12/2018: Conclusions: 1. Severe single vessel coronary artery disease with diffuse disease involving the RCA with focal lesions of up to 80% involving the proximal and distal vessel (see diagnostic angiography report by Dr. Rockey Situ for details). 2. Successful PCI to the proximal RCA using a Resolute Onyx 2.75 x 22 mm drug-eluting stent with 0% residual stenosis and TIMI-3 flow. 3. Successful PCI to the distal RCA using a Resolute Onyx 2.75 x 18 mm drug-eluting stent with 0% residual stenosis and TIMI-3 flow.  Recommendations: 1. Overnight extended recovery. 2. Dual antiplatelet therapy with aspirin and clopidogrel for at least 12 months, ideally longer given multivessel coronary artery and peripheral vascular disease. 3. Aggressive secondary prevention.  Patient Profile     53 y.o. female with history of CAD s/p PCI/DES x 2 to the RCA in 12/8451 complicated by left groin hematoma, PAD s/p right femoral  endarterectomy, AAA s/p endovascular repair, HTN, HLD, PVCs, orthostasis, and prior tobacco abuse admitted with chest pain in the setting of hypertensive urgency.   Assessment & Plan    1. Chest pain: -Ruled out -Resolved -Likely in the setting of accelerated HTN -Ambulate this morning -No plans for inpatient ischemic evaluation at this time  2. Accelerated HTN: -Much improved -Continue current regimen including amlodipine 7.5 mg, Toprol XL 75 mg, and losartan 100 mg  3. CAD involving the native coronary artery: -Chest pain free -Continue DAPT with ASA and Plavix -Continue Toprol and Lipitor  4. PVCs: -Asymptomatic  -Toprol XL as above  5. PAD with stable claudication: -Followed by vascular surgery  6. AAA: -Status post endovascular repair -Followed by vascular surgery  -Optimal BP control   7. HLD: -LDL of 129 with a goal LDL < 70 -Add Zetia -Continue Lipitor 80 mg daily -  Recheck FLP in 8 weeks  8. Anemia: -HGB improving  For questions or updates, please contact [facility] Please consult [email or web address] for contact info under Cardiology/STEMI.    Signed, Christell Faith, PA-C [facility] Pager: [phone] 04/11/2019, 8:32 AM

## 2019-04-11 NOTE — Progress Notes (Signed)
Patient blood pressure WNL this morning, discharged home, prescription information provided along with patient education. Patient aware and acknowledges follow up appointments. No complaints or concerns at this time.

## 2019-04-11 NOTE — Plan of Care (Signed)
Problem: Education: Goal: Knowledge of General Education information will improve Description: Including pain rating scale, medication(s)/side effects and non-pharmacologic comfort measures 04/11/2019 1111 by Alen Blew, RN Outcome: Adequate for Discharge 04/11/2019 1111 by Alen Blew, RN Outcome: Adequate for Discharge   Problem: Health Behavior/Discharge Planning: Goal: Ability to manage health-related needs will improve 04/11/2019 1111 by Alen Blew, RN Outcome: Adequate for Discharge 04/11/2019 1111 by Alen Blew, RN Outcome: Adequate for Discharge   Problem: Clinical Measurements: Goal: Ability to maintain clinical measurements within normal limits will improve 04/11/2019 1111 by Alen Blew, RN Outcome: Adequate for Discharge 04/11/2019 1111 by Alen Blew, RN Outcome: Adequate for Discharge Goal: Will remain free from infection 04/11/2019 1111 by Alen Blew, RN Outcome: Adequate for Discharge 04/11/2019 1111 by Alen Blew, RN Outcome: Adequate for Discharge Goal: Diagnostic test results will improve 04/11/2019 1111 by Alen Blew, RN Outcome: Adequate for Discharge 04/11/2019 1111 by Alen Blew, RN Outcome: Adequate for Discharge Goal: Respiratory complications will improve 04/11/2019 1111 by Alen Blew, RN Outcome: Adequate for Discharge 04/11/2019 1111 by Alen Blew, RN Outcome: Adequate for Discharge Goal: Cardiovascular complication will be avoided 04/11/2019 1111 by Alen Blew, RN Outcome: Adequate for Discharge 04/11/2019 1111 by Alen Blew, RN Outcome: Adequate for Discharge   Problem: Activity: Goal: Risk for activity intolerance will decrease 04/11/2019 1111 by Alen Blew, RN Outcome: Adequate for Discharge 04/11/2019 1111 by Alen Blew, RN Outcome: Adequate for Discharge   Problem: Nutrition: Goal: Adequate nutrition will be maintained 04/11/2019 1111 by Alen Blew,  RN Outcome: Adequate for Discharge 04/11/2019 1111 by Alen Blew, RN Outcome: Adequate for Discharge   Problem: Coping: Goal: Level of anxiety will decrease 04/11/2019 1111 by Alen Blew, RN Outcome: Adequate for Discharge 04/11/2019 1111 by Alen Blew, RN Outcome: Adequate for Discharge   Problem: Elimination: Goal: Will not experience complications related to bowel motility 04/11/2019 1111 by Alen Blew, RN Outcome: Adequate for Discharge 04/11/2019 1111 by Alen Blew, RN Outcome: Adequate for Discharge Goal: Will not experience complications related to urinary retention 04/11/2019 1111 by Alen Blew, RN Outcome: Adequate for Discharge 04/11/2019 1111 by Alen Blew, RN Outcome: Adequate for Discharge   Problem: Pain Managment: Goal: General experience of comfort will improve 04/11/2019 1111 by Alen Blew, RN Outcome: Adequate for Discharge 04/11/2019 1111 by Alen Blew, RN Outcome: Adequate for Discharge   Problem: Safety: Goal: Ability to remain free from injury will improve 04/11/2019 1111 by Alen Blew, RN Outcome: Adequate for Discharge 04/11/2019 1111 by Alen Blew, RN Outcome: Adequate for Discharge   Problem: Skin Integrity: Goal: Risk for impaired skin integrity will decrease 04/11/2019 1111 by Alen Blew, RN Outcome: Adequate for Discharge 04/11/2019 1111 by Alen Blew, RN Outcome: Adequate for Discharge   Problem: Education: Goal: Understanding of cardiac disease, CV risk reduction, and recovery process will improve 04/11/2019 1111 by Alen Blew, RN Outcome: Adequate for Discharge 04/11/2019 1111 by Alen Blew, RN Outcome: Adequate for Discharge Goal: Individualized Educational Video(s) 04/11/2019 1111 by Alen Blew, RN Outcome: Adequate for Discharge 04/11/2019 1111 by Alen Blew, RN Outcome: Adequate for Discharge   Problem: Activity: Goal: Ability to tolerate  increased activity will improve 04/11/2019 1111 by Alen Blew, RN Outcome: Adequate for Discharge 04/11/2019 1111 by Alen Blew, RN Outcome: Adequate for Discharge   Problem: Cardiac:  Goal: Ability to achieve and maintain adequate cardiovascular perfusion will improve 04/11/2019 1111 by Alen Blew, RN Outcome: Adequate for Discharge 04/11/2019 1111 by Alen Blew, RN Outcome: Adequate for Discharge   Problem: Health Behavior/Discharge Planning: Goal: Ability to safely manage health-related needs after discharge will improve 04/11/2019 1111 by Alen Blew, RN Outcome: Adequate for Discharge 04/11/2019 1111 by Alen Blew, RN Outcome: Adequate for Discharge

## 2019-04-11 NOTE — Progress Notes (Signed)
     Heather Mahoney was admitted to the [facility] on 04/09/2019 for an acute cardiac condition and is being Discharged on  04/11/2019 . She will need another 3-4 days for recovery and so advised to stay away from work until then. So please excuse  her from work for the above days. Should be able to return to work  without any restrictions from 04/16/19 if not symptomatic.  Call Gladstone Lighter  MD, [facility] at  [phone] with questions.  Gladstone Lighter M.D on 04/11/2019,at 10:14 AM  [facility] [address], [place] [address]

## 2019-04-11 NOTE — Discharge Summary (Signed)
[facility] NAME: Heather Mahoney    MR#:  [id number]  DATE OF BIRTH:  1965-12-08  DATE OF ADMISSION:  04/09/2019   ADMITTING PHYSICIAN: Mayer Camel, NP  DATE OF DISCHARGE:  04/11/19  PRIMARY CARE PHYSICIAN: Patient, No Pcp Per   ADMISSION DIAGNOSIS:   Chest pain [R07.9] Chest pain, unspecified type [R07.9] Hypertension, unspecified type [I10]  DISCHARGE DIAGNOSIS:   Active Problems:   Chest pain   Precordial chest pain   SECONDARY DIAGNOSIS:   Past Medical History:  Diagnosis Date  . CAD (coronary artery disease)    a. PCI to proximal and distal RCA 3/26  . GERD (gastroesophageal reflux disease)   . Heart murmur    MR  . Hiatal hernia   . Hyperlipidemia   . Hypertension   . Tobacco abuse    a. Started at age 4, quit during 3 pregnancies. b. 1 PPD for a 40 pack-year hx (2019)     HOSPITAL COURSE:   53 year old female with past medical history significant for CAD status post PCI to RCA in March 2020, AAA status post endovascular repair in October 2019, previous cardiac cath complicated with left groin hematoma, PAD presents to hospital secondary to chest pain.  1.  Chest pain-angina. secondary to elevated blood pressure likely. -Known history of CAD, last stents in March 2022 distal and proximal RCA.  Appreciate cardiology consult. -CT angiogram negative for any dissection or pulmonary embolism.  Troponins are negative.  No acute EKG changes. -Chest pain is resolved.  Continue to monitor carefully.  No further cardiac intervention is planned at this time.  2.  Hypertensive urgency-blood pressure is improving.  Currently on Norvasc, losartan and Toprol.  Doses have been adjusted  3.  PAD-currently on aspirin and Plavix.  Outpatient follow-up with vascular for angiogram.  However has good  pulses in both feet S/p endovascular repair for AAA in October 2019  4.  CAD-stable.  Appreciate cardiology input.   Continue cardiac medications   Independent at baseline.  Ambulated well prior to discharge without any symptoms.   DISCHARGE CONDITIONS:   Guarded  CONSULTS OBTAINED:   Treatment Team:  Burnell Blanks, MD  DRUG ALLERGIES:   Allergies  Allergen Reactions  . Strawberry Extract Hives and Swelling  . Strawberry Flavor Rash   DISCHARGE MEDICATIONS:   Allergies as of 04/11/2019      Reactions   Strawberry Extract Hives, Swelling   Strawberry Flavor Rash      Medication List    STOP taking these medications   oxyCODONE-acetaminophen 5-325 MG tablet Commonly known as: PERCOCET/ROXICET     TAKE these medications   amLODipine 5 MG tablet Commonly known as: NORVASC Take 1.5 tablets (7.5 mg total) by mouth daily.   aspirin 81 MG EC tablet Take 1 tablet (81 mg total) by mouth daily.   atorvastatin 80 MG tablet Commonly known as: LIPITOR Take 1 tablet (80 mg total) by mouth daily.   clopidogrel 75 MG tablet Commonly known as: PLAVIX Take 1 tablet (75 mg total) by mouth daily.   docusate sodium 100 MG capsule Commonly known as: COLACE Take 1 capsule (100 mg total) by mouth daily.   ezetimibe 10 MG tablet Commonly known as: ZETIA Take 1 tablet (10 mg total) by mouth daily. Start taking on: April 12, 2019   losartan 100 MG tablet Commonly known as: COZAAR Take 100 mg by mouth daily. What changed: Another medication  with the same name was removed. Continue taking this medication, and follow the directions you see here.   metoprolol succinate 25 MG 24 hr tablet Commonly known as: TOPROL-XL Take 3 tablets (75 mg total) by mouth daily. Start taking on: April 12, 2019 What changed: how much to take   nitroGLYCERIN 0.4 MG SL tablet Commonly known as: NITROSTAT Place 1 tablet (0.4 mg total) under the tongue every 5 (five) minutes as needed for chest pain.        DISCHARGE INSTRUCTIONS:   1. PCP f/u in 1-2 weeks 2. Cardiology f/u in 2 weeks  DIET:    Cardiac diet  ACTIVITY:   Activity as tolerated  OXYGEN:   Home Oxygen: No.  Oxygen Delivery: room air  DISCHARGE LOCATION:   home   If you experience worsening of your admission symptoms, develop shortness of breath, life threatening emergency, suicidal or homicidal thoughts you must seek medical attention immediately by calling 911 or calling your MD immediately  if symptoms less severe.  You Must read complete instructions/literature along with all the possible adverse reactions/side effects for all the Medicines you take and that have been prescribed to you. Take any new Medicines after you have completely understood and accpet all the possible adverse reactions/side effects.   Please note  You were cared for by a hospitalist during your hospital stay. If you have any questions about your discharge medications or the care you received while you were in the hospital after you are discharged, you can call the unit and asked to speak with the hospitalist on call if the hospitalist that took care of you is not available. Once you are discharged, your primary care physician will handle any further medical issues. Please note that NO REFILLS for any discharge medications will be authorized once you are discharged, as it is imperative that you return to your primary care physician (or establish a relationship with a primary care physician if you do not have one) for your aftercare needs so that they can reassess your need for medications and monitor your lab values.    On the day of Discharge:  VITAL SIGNS:   Blood pressure (!) 127/99, pulse 75, temperature 98 F (36.7 C), temperature source Oral, resp. rate 19, height 5\' 6"  (1.676 m), weight 77.1 kg, SpO2 99 %.  PHYSICAL EXAMINATION:     GENERAL:  53 y.o.-year-old patient lying in the bed with no acute distress.  EYES: Pupils equal, round, reactive to light and accommodation. No scleral icterus. Extraocular muscles intact.   HEENT: Head atraumatic, normocephalic. Oropharynx and nasopharynx clear.  NECK:  Supple, no jugular venous distention. No thyroid enlargement, no tenderness.  LUNGS: Normal breath sounds bilaterally, no wheezing, rales,rhonchi or crepitation. No use of accessory muscles of respiration.  CARDIOVASCULAR: S1, S2 normal. No   rubs, or gallops. 2/6 systolic murmur present ABDOMEN: Soft, nontender, nondistended. Bowel sounds present. No organomegaly or mass.  EXTREMITIES: No pedal edema, cyanosis, or clubbing.  NEUROLOGIC: Cranial nerves II through XII are intact. Muscle strength 5/5 in all extremities. Sensation intact. Gait not checked.  PSYCHIATRIC: The patient is alert and oriented x 3.  SKIN: No obvious rash, lesion, or ulcer.   DATA REVIEW:   CBC Recent Labs  Lab 04/09/19 1644  WBC 10.5  HGB 11.6*  HCT 35.8*  PLT 488*    Chemistries  Recent Labs  Lab 04/11/19 0653  NA 139  K 3.4*  CL 105  CO2 22  GLUCOSE 110*  BUN 15  CREATININE 0.65  CALCIUM 9.2     Microbiology Results  Results for orders placed or performed during the hospital encounter of 01/13/19  MRSA PCR Screening     Status: None   Collection Time: 01/13/19  5:51 PM   Specimen: Nasal Mucosa; Nasopharyngeal  Result Value Ref Range Status   MRSA by PCR NEGATIVE NEGATIVE Final    Comment:        The GeneXpert MRSA Assay (FDA approved for NASAL specimens only), is one component of a comprehensive MRSA colonization surveillance program. It is not intended to diagnose MRSA infection nor to guide or monitor treatment for MRSA infections. Performed at [facility], [address] [place], [place] [address]     RADIOLOGY:  No results found.   Management plans discussed with the patient, family and they are in agreement.  CODE STATUS:     Code Status Orders  (From admission, onward)         Start     Ordered   04/09/19 2032  Full code  Continuous     04/09/19 2031        Code  Status History    Date Active Date Inactive Code Status Order ID Comments User Context   04/09/2019 2015 04/09/2019 2031 Full Code [id number]  Mayer Camel, NP ED   01/13/2019 1559 01/15/2019 1521 Full Code [id number]  Nelva Bush, MD Inpatient   08/10/2018 2200 08/15/2018 1945 Full Code [id number]  Lance Coon, MD ED   Advance Care Planning Activity      TOTAL TIME TAKING CARE OF THIS PATIENT: 38  minutes.    Gladstone Lighter M.D on 04/11/2019 at 11:23 AM  Between 7am to 6pm - Pager - [phone]  After 6pm go to [email or web address] - Proofreader  [employer]  Hospitalists  Office  [phone]  CC: Primary care physician; Patient, No Pcp Per   Note: This dictation was prepared with Dragon dictation along with smaller phrase technology. Any transcriptional errors that result from this process are unintentional.

## 2019-04-11 NOTE — Plan of Care (Signed)

## 2019-04-12 ENCOUNTER — Telehealth: Payer: Self-pay

## 2019-04-12 LAB — NOVEL CORONAVIRUS, NAA (HOSP ORDER, SEND-OUT TO REF LAB; TAT 18-24 HRS): SARS-CoV-2, NAA: NOT DETECTED

## 2019-04-12 NOTE — Telephone Encounter (Signed)
Patient returning call.

## 2019-04-12 NOTE — Telephone Encounter (Signed)
-----   Message from Arvil Chaco, PA-C sent at 04/12/2019  3:45 PM EDT ----- Please let Heather Mahoney know that her TSH came back slightly elevated at 4.946. She should follow-up with her PCP for further evaluation and treatment.   I recall her mentioning during her admission that she does not have a regular PCP due to insurance. If this is the case, please see if she might consider the [facility], as they work on a sliding scale for payment. The contact info is as follows: Address: [place], [place] [address] Phone:  [phone]  I will CC her primary cardiologist on this message.

## 2019-04-12 NOTE — Telephone Encounter (Signed)
Incoming call from patient, reviewed TSH results and gave her information for [facility] clinic.   Advised pt to call for any further questions or concerns.

## 2019-04-12 NOTE — Telephone Encounter (Signed)
Attempted to call patient. LMTCB 04/12/2019

## 2019-04-13 ENCOUNTER — Telehealth: Payer: Self-pay | Admitting: Cardiovascular Disease

## 2019-04-13 ENCOUNTER — Encounter: Payer: Self-pay | Admitting: Cardiovascular Disease

## 2019-04-13 NOTE — Telephone Encounter (Signed)
Received request for Medical Certification from [facility] in interoffice mail and sent to [facility]

## 2019-04-14 ENCOUNTER — Telehealth: Payer: Self-pay | Admitting: *Deleted

## 2019-04-14 NOTE — Telephone Encounter (Signed)

## 2019-04-15 NOTE — Telephone Encounter (Signed)
Received a request from [facility]. With additional patient information sheet attached. Placed in interoffice mail and sent to [facility].

## 2019-04-18 NOTE — Progress Notes (Signed)
Cardiology Office Note Date:  04/19/2019  Patient ID:  Heather Mahoney, [place] [birth year], MRN [id number] PCP:  [facility]  Cardiologist:  Dr. Rockey Situ, MD    Chief Complaint: Hospital follow up  History of Present Illness: Heather Mahoney is a 53 y.o. female with history of CAD s/p PCI/DES to the RCA in 12/2018, syncope, frequent PVCs, AAA s/p endovascular repair along with right femoral endarterectomy due to ischemic right lower extremity in 07/2018 followed by vascular surgery, anemia, HTN, HLD, and tobacco abuse quitting in 07/2018 who presents for hospital follow up after recent admission to [facility] from 6/20-6/22 for chest pain in the setting of accelerated HTN.   Patient was previously followed by Dr. Yvone Neu, for possible syncope in 2017, though more recently has established with Dr. Rockey Situ. Most recent echo from 01/2016 showed an EF of 55-60%, no RWMA, normal diastolic function, mild MR, RVSF normal, PASP normal, trivial pericardial effusion was noted. Myoview in 01/2016 showed no significant ischemia, EF 46% likely decreased secondary to GI uptake artifact, rare PVCs, good exercise tolerance with the patient achieving 10.1 METs. This was a low risk scan. Outpatient cardiac monitoring in 01/2016 showed an overall sinus rhythm with an average heart rate of 88 bpm (range 57-136 bpm). 10% of the beats were tachycardic. 4 isolated PACs were noted along with 14,946 isolated PVCs and 53 ventricular couplets, representing 12% burden. No documented Afib.   She was seen in 11/2018 with exertional dyspnea and underwent coronary CTA which showed hemodynamically significant stenosis in the mid RCA, indeterminate FFR in the proximal LAD and proximal LCx. In this setting, she underwent diagnostic LHC on 01/13/2019 that showed ostial LAD 30%, proximal to mid LCx 40%, OM1 60%, proximal RCA 75%, mid RCA 80%, LVEF 65%, LVEDP normal. She underwent successful PCI/DES x 2 to the RCA. Echo performed 02/23/2019  (delayed in the setting of COVID-19 pandemic) showed an EF of 50-55%, unable to exclude hypokinesis of the inferior wall, diastolic dysfunction, mild mitral regurgitation. Most recent abdominal ultrasound from 04/01/2019 showed patent endovascular aneurysm repair without evidence of endoleak with lower extremity ABIs showing 0.90 on the right and 0.92 on the left along with widely patent right femoral artery s/p thrombectomy with possible hemodynamically significant stenosis of the right SFA which is followed by vascular surgery.   She was admitted to the hospital from 04/09/2019 through 04/11/2019 for chest pain felt to be in the setting of accelerated hypertension with BP of 198/128. CTA was negative for acute aortic dissection or aneurysm. Troponin was negative x 4. It appeared some of her antihypertensives had been decreased/stopped in the setting of left groin hematoma. With resumption/escalation of antihypertensive therapy her symptoms resolved and her BP improved.   Labs: 03/2019 - TSH 4.946, free T4 normal, K+ 3.4, SCr 0.65, LDL 129, HGB 11.6  She comes in doing well from a cardiac perspective.  Her blood pressure has remained well controlled in the 778E to 423N systolic following reinitiation of antihypertensive therapy.  She does continue to note some exertional chest tightness when walking prolonged distances as well as continued fatigability.  No shortness of breath, palpitations, dizziness, presyncope, or syncope.  Currently chest pain-free.  Tolerating all medications without issues.  No falls, BRBPR, or melena.  No lower extremity swelling, orthopnea, PND, early satiety.  She is not eating any further fried foods though does continue to apply salt seasoning to her foods.  She notes stable right lower extremity claudication-like symptoms.  Past Medical History:  Diagnosis Date   CAD (coronary artery disease)    a. PCI to proximal and distal RCA 3/26   GERD (gastroesophageal reflux disease)     Heart murmur    MR   Hiatal hernia    Hyperlipidemia    Hypertension    Tobacco abuse    a. Started at age 68, quit during 3 pregnancies. b. 1 PPD for a 40 pack-year hx (2019)     Past Surgical History:  Procedure Laterality Date   BREAST SURGERY  1986   I&D for 'milk duct'   CORONARY STENT INTERVENTION N/A 01/13/2019   Procedure: CORONARY STENT INTERVENTION;  Surgeon: Nelva Bush, MD;  Location: [facility];  Service: Cardiovascular;  Laterality: N/A;   EMBOLECTOMY  08/13/2018   Procedure: Right SFA and profunda femoris Fogarty embolectomy 3  Fogarty embolectomy balloon;  Surgeon: Algernon Huxley, MD;  Location: [facility];  Service: Vascular;;   ENDARTERECTOMY FEMORAL Right 08/13/2018   Procedure: Right common femoral, profunda femoris, and superficial femoral artery endarterectomies and patch angioplasty ;  Surgeon: Algernon Huxley, MD;  Location: [facility];  Service: Vascular;  Laterality: Right;   ENDOVASCULAR REPAIR/STENT GRAFT N/A 08/13/2018   Procedure: ENDOVASCULAR REPAIR/STENT GRAFT;  Surgeon: Katha Cabal, MD;  Location: [facility];  Service: Cardiovascular;  Laterality: N/A;   LEFT HEART CATH AND CORONARY ANGIOGRAPHY Left 01/13/2019   Procedure: LEFT HEART CATH AND CORONARY ANGIOGRAPHY;  Surgeon: Minna Merritts, MD;  Location: [facility];  Service: Cardiovascular;  Laterality: Left;   SALPINGOOPHORECTOMY Left 2000    Current Meds  Medication Sig   amLODipine (NORVASC) 10 MG tablet Take 1 tablet (10 mg total) by mouth daily.   aspirin 81 MG EC tablet Take 1 tablet (81 mg total) by mouth daily.   atorvastatin (LIPITOR) 80 MG tablet Take 1 tablet (80 mg total) by mouth daily.   clopidogrel (PLAVIX) 75 MG tablet Take 1 tablet (75 mg total) by mouth daily.   docusate sodium (COLACE) 100 MG capsule Take 1 capsule (100 mg total) by mouth daily.   ezetimibe (ZETIA) 10 MG tablet Take 1 tablet (10 mg total) by mouth daily.    losartan (COZAAR) 100 MG tablet Take 100 mg by mouth daily.   metoprolol succinate (TOPROL-XL) 25 MG 24 hr tablet Take 3 tablets (75 mg total) by mouth daily.   nitroGLYCERIN (NITROSTAT) 0.4 MG SL tablet Place 1 tablet (0.4 mg total) under the tongue every 5 (five) minutes as needed for chest pain.   [DISCONTINUED] amLODipine (NORVASC) 5 MG tablet Take 1.5 tablets (7.5 mg total) by mouth daily.    Allergies:   Strawberry extract and Strawberry flavor   Social History:  The patient  reports that she quit smoking about 8 months ago. Her smoking use included cigarettes. She has a 9.50 pack-year smoking history. She has never used smokeless tobacco. She reports current alcohol use. She reports current drug use. Drug: Marijuana.   Family History:  The patient's family history includes Asthma in her mother; Diabetes in her maternal grandmother and mother; Heart attack in her mother; Heart disease in her maternal grandmother and mother; Hyperlipidemia in her maternal grandmother and mother; Hypertension in her mother; Sarcoidosis in her mother.  ROS:   Review of Systems  Constitutional: Positive for malaise/fatigue. Negative for chills, diaphoresis, fever and weight loss.  HENT: Negative for congestion.   Eyes: Negative for discharge and redness.  Respiratory: Negative for  cough, hemoptysis, sputum production, shortness of breath and wheezing.   Cardiovascular: Positive for chest pain and claudication. Negative for palpitations, orthopnea, leg swelling and PND.  Gastrointestinal: Negative for abdominal pain, blood in stool, heartburn, melena, nausea and vomiting.  Genitourinary: Negative for hematuria.  Musculoskeletal: Negative for falls and myalgias.  Skin: Negative for rash.  Neurological: Positive for weakness. Negative for dizziness, tingling, tremors, sensory change, speech change, focal weakness and loss of consciousness.  Endo/Heme/Allergies: Does not bruise/bleed easily.    Psychiatric/Behavioral: Negative for substance abuse. The patient is not nervous/anxious.   All other systems reviewed and are negative.    PHYSICAL EXAM:  VS:  BP 126/78 (BP Location: Left Arm, Patient Position: Sitting, Cuff Size: Normal)    Pulse 62    Ht 5\' 6"  (1.676 m)    Wt 171 lb 8 oz (77.8 kg)    BMI 27.68 kg/m  BMI: Body mass index is 27.68 kg/m.  Physical Exam  Constitutional: She is oriented to person, place, and time. She appears well-developed and well-nourished.  HENT:  Head: Normocephalic and atraumatic.  Eyes: Right eye exhibits no discharge. Left eye exhibits no discharge.  Neck: Normal range of motion. No JVD present.  Cardiovascular: Normal rate, regular rhythm, S1 normal, S2 normal and normal heart sounds. Exam reveals no distant heart sounds, no friction rub, no midsystolic click and no opening snap.  No murmur heard. Pulses:      Posterior tibial pulses are 1+ on the right side and 1+ on the left side.  Pulmonary/Chest: Effort normal and breath sounds normal. No respiratory distress. She has no decreased breath sounds. She has no wheezes. She has no rales. She exhibits no tenderness.  Abdominal: Soft. She exhibits no distension. There is no abdominal tenderness.  Musculoskeletal:        General: No edema.  Neurological: She is alert and oriented to person, place, and time.  Skin: Skin is warm and dry. No cyanosis. Nails show no clubbing.  Psychiatric: She has a normal mood and affect. Her speech is normal and behavior is normal. Judgment and thought content normal.     EKG:  Was ordered and interpreted by me today. Shows NSR, 62 bpm, normal axis, rare PVC, nonspecific ST-T changes  Recent Labs: 08/15/2018: Magnesium 2.0 01/08/2019: ALT 16 04/09/2019: Hemoglobin 11.6; Platelets 488 04/10/2019: TSH 4.946 04/11/2019: BUN 15; Creatinine, Ser 0.65; Potassium 3.4; Sodium 139  12/06/2018: LDL Direct 185 04/10/2019: Cholesterol 200; HDL 55; LDL Cholesterol 129; Total  CHOL/HDL Ratio 3.6; Triglycerides 82; VLDL 16   Estimated Creatinine Clearance: 84.6 mL/min (by C-G formula based on SCr of 0.65 mg/dL).   Wt Readings from Last 3 Encounters:  04/19/19 171 lb 8 oz (77.8 kg)  04/09/19 170 lb (77.1 kg)  04/01/19 170 lb (77.1 kg)     Other studies reviewed: Additional studies/records reviewed today include: summarized above  ASSESSMENT AND PLAN:  1. CAD involving the native coronary arteries with stable angina: Currently chest pain-free.  Recent cardiac cath in 12/2018 as outlined above with subsequent PCI x2 to the RCA with residual nonobstructive disease involving the left coronary tree.  Recent hospital admission earlier this month in the setting of hypertensive malignancy with negative troponin x4.  She continues to note some exertional chest discomfort which feels similar to her presentation leading up to her PCI in 12/2018 oh it is reassuring that her troponins were negative x4 recently.  We have agreed to escalate medical therapy initially with titration of amlodipine  to 10 mg daily with consideration for addition of isosorbide mononitrate if needed down the road in an effort to optimize medical therapy.  Otherwise, she will continue dual antiplatelet therapy with aspirin and Plavix.  Continue current dose of Toprol-XL and Lipitor.  Aggressive risk factor modification and secondary prevention.  2. Hypertension: Blood pressure is well controlled.  Continue titrated dose of amlodipine as above as well as current doses of losartan and Toprol-XL.  3. PAD with stable claudication: Followed by vascular surgery.  4. AAA: Status post endovascular repair.  Optimal blood pressure control.  Followed by vascular surgery.  5. Hyperlipidemia: Recent LDL of 129 with a goal LDL being less than 70.  Recently started on Zetia.  At follow-up recommend checking fasting lipid panel and liver function.  If LDL remains above goal at that time consider transition to Crestor versus  addition of PCSK9 inhibitor or bempedoic acid.   6. Anemia: Most recent hemoglobin improved to 11.6.  Follow-up with PCP.  Disposition: F/u with Dr. Rockey Situ or an APP in 6 months.  Current medicines are reviewed at length with the patient today.  The patient did not have any concerns regarding medicines.  Signed, Christell Faith, PA-C 04/19/2019 4:28 PM     [place] [place], [place] [id number] [phone]

## 2019-04-19 ENCOUNTER — Other Ambulatory Visit: Payer: Self-pay

## 2019-04-19 ENCOUNTER — Encounter: Payer: Self-pay | Admitting: Physician Assistant

## 2019-04-19 ENCOUNTER — Ambulatory Visit (INDEPENDENT_AMBULATORY_CARE_PROVIDER_SITE_OTHER): Payer: Self-pay | Admitting: Physician Assistant

## 2019-04-19 VITALS — BP 126/78 | HR 62 | Ht 66.0 in | Wt 171.5 lb

## 2019-04-19 DIAGNOSIS — I714 Abdominal aortic aneurysm, without rupture, unspecified: Secondary | ICD-10-CM

## 2019-04-19 DIAGNOSIS — D649 Anemia, unspecified: Secondary | ICD-10-CM

## 2019-04-19 DIAGNOSIS — E785 Hyperlipidemia, unspecified: Secondary | ICD-10-CM

## 2019-04-19 DIAGNOSIS — I25118 Atherosclerotic heart disease of native coronary artery with other forms of angina pectoris: Secondary | ICD-10-CM

## 2019-04-19 DIAGNOSIS — I10 Essential (primary) hypertension: Secondary | ICD-10-CM

## 2019-04-19 DIAGNOSIS — I739 Peripheral vascular disease, unspecified: Secondary | ICD-10-CM

## 2019-04-19 MED ORDER — AMLODIPINE BESYLATE 10 MG PO TABS: 10.0000 mg | ORAL_TABLET | Freq: Every day | ORAL | 3 refills | Status: DC

## 2019-04-19 NOTE — Patient Instructions (Signed)
Medication Instructions:  Your physician has recommended you make the following change in your medication:  1- INCREASE Amlodipine to 1 tablet (10 mg total) once daily  If you need a refill on your cardiac medications before your next appointment, please call your pharmacy.   Lab work: None ordered  If you have labs (blood work) drawn today and your tests are completely normal, you will receive your results only by: Marland Kitchen MyChart Message (if you have MyChart) OR . A paper copy in the mail If you have any lab test that is abnormal or we need to change your treatment, we will call you to review the results.  Testing/Procedures: None ordered   Follow-Up: At [facility], you and your health needs are our priority.  As part of our continuing mission to provide you with exceptional heart care, we have created designated Provider Care Teams.  These Care Teams include your primary Cardiologist (physician) and Advanced Practice Providers (APPs -  Physician Assistants and Nurse Practitioners) who all work together to provide you with the care you need, when you need it. You will need a follow up appointment in 6 months.  Please call our office 2 months in advance to schedule this appointment.  You may see Ida Rogue, MD or Christell Faith, PA-C.

## 2019-05-02 NOTE — Telephone Encounter (Signed)
Received forms for physician to complete. Placed in nurses box 

## 2019-05-03 ENCOUNTER — Telehealth: Payer: Self-pay | Admitting: Cardiovascular Disease

## 2019-05-03 NOTE — Telephone Encounter (Signed)
[employer] forms received and placed on providers desk for signature.

## 2019-05-04 ENCOUNTER — Other Ambulatory Visit: Payer: Self-pay | Admitting: Cardiovascular Disease

## 2019-05-04 NOTE — Telephone Encounter (Signed)
Forms completed and placed on Heather Mahoney's desk for processing.

## 2019-05-04 NOTE — Telephone Encounter (Signed)
Sent completed forms to [employer] 

## 2019-05-04 NOTE — Telephone Encounter (Signed)
*  STAT* If patient is at the pharmacy, call can be transferred to refill team.   1. Which medications need to be refilled? (please list name of each medication and dose if known) Losartan  2. Which pharmacy/location (including street and city if local pharmacy) is medication to be sent to? [facility] [place]  3. Do they need a 30 day or 90 day supply? [place]

## 2019-05-12 ENCOUNTER — Telehealth: Payer: Self-pay | Admitting: Cardiovascular Disease

## 2019-05-12 NOTE — Telephone Encounter (Signed)
Received Disability Determination Forms to be completed  Placed in interoffice mail and sent to [facility]

## 2019-05-26 ENCOUNTER — Telehealth: Payer: Self-pay | Admitting: Pharmacy Technician

## 2019-05-26 NOTE — Telephone Encounter (Signed)
Patient failed to provide requested financial documentation.  Financial documentation is required in order to determine patient's eligibility for [facility] program.  No additional medication assistance will be provided until patient provides requested financial documentation.  Patient notified by letter.  Cayde Held Pharmacy Technician/Eligibility Specialist [facility] 

## 2019-06-21 ENCOUNTER — Telehealth: Payer: Self-pay | Admitting: Cardiovascular Disease

## 2019-06-21 ENCOUNTER — Other Ambulatory Visit: Payer: Self-pay | Admitting: Physician Assistant

## 2019-06-21 NOTE — Telephone Encounter (Signed)
Called [facility] to clarify the dosing. They were having conversation with patient to help decrease the amount she was paying. I spoke with Heather Mahoney a little longer and she found a GoodRx coupon for $15 a month for the 80 mg tablets.   Called patient and let her know about this. Patient stated this was fine and she can manage that price for the atorvastatin. She was very Patent attorney.

## 2019-06-21 NOTE — Telephone Encounter (Signed)
Patient called to check status   *STAT* If patient is at the pharmacy, call can be transferred to refill team.   1. Which medications need to be refilled? (please list name of each medication and dose if known) losartan 100 MG 1 tablet daily   2. Which pharmacy/location (including street and city if local pharmacy) is medication to be sent to? [place]  3. Do they need a 30 day or 90 day supply? 30 day

## 2019-06-21 NOTE — Telephone Encounter (Signed)
Pt c/o medication issue:  1. Name of Medication: atorvastatin (Lipitor)   2. How are you currently taking this medication (dosage and times per day)? 80 MG 1 daily  3. Are you having a reaction (difficulty breathing--STAT)? no  4. What is your medication issue? Patient calling, patient was told at pharmacy that it would be less expensive to use Lipitor 40 MG 2 times a day than Lipitor 80 MG 1 time a day.  Patient would like to know if this change will be ok with doctor.  Please call to discuss.

## 2019-07-05 ENCOUNTER — Ambulatory Visit (INDEPENDENT_AMBULATORY_CARE_PROVIDER_SITE_OTHER): Payer: Self-pay | Admitting: Vascular Surgery

## 2019-07-05 ENCOUNTER — Encounter (INDEPENDENT_AMBULATORY_CARE_PROVIDER_SITE_OTHER): Payer: Self-pay | Admitting: Vascular Surgery

## 2019-07-05 ENCOUNTER — Ambulatory Visit (INDEPENDENT_AMBULATORY_CARE_PROVIDER_SITE_OTHER): Payer: Self-pay

## 2019-07-05 ENCOUNTER — Other Ambulatory Visit: Payer: Self-pay

## 2019-07-05 VITALS — BP 187/105 | HR 74 | Resp 16 | Wt 170.0 lb

## 2019-07-05 DIAGNOSIS — I714 Abdominal aortic aneurysm, without rupture, unspecified: Secondary | ICD-10-CM

## 2019-07-05 DIAGNOSIS — I70213 Atherosclerosis of native arteries of extremities with intermittent claudication, bilateral legs: Secondary | ICD-10-CM

## 2019-07-05 DIAGNOSIS — I739 Peripheral vascular disease, unspecified: Secondary | ICD-10-CM

## 2019-07-05 DIAGNOSIS — E119 Type 2 diabetes mellitus without complications: Secondary | ICD-10-CM | POA: Insufficient documentation

## 2019-07-05 DIAGNOSIS — I10 Essential (primary) hypertension: Secondary | ICD-10-CM

## 2019-07-05 NOTE — Patient Instructions (Signed)
Peripheral Vascular Disease  Peripheral vascular disease (PVD) is a disease of the blood vessels that are not part of your heart and brain. A simple term for PVD is poor circulation. In most cases, PVD narrows the blood vessels that carry blood from your heart to the rest of your body. This can reduce the supply of blood to your arms, legs, and internal organs, like your stomach or kidneys. However, PVD most often affects a person's lower legs and feet. Without treatment, PVD tends to get worse. PVD can also lead to acute ischemic limb. This is when an arm or leg suddenly cannot get enough blood. This is a medical emergency. Follow these instructions at home: Lifestyle  Do not use any products that contain nicotine or tobacco, such as cigarettes and e-cigarettes. If you need help quitting, ask your doctor.  Lose weight if you are overweight. Or, stay at a healthy weight as told by your doctor.  Eat a diet that is low in fat and cholesterol. If you need help, ask your doctor.  Exercise regularly. Ask your doctor for activities that are right for you. General instructions  Take over-the-counter and prescription medicines only as told by your doctor.  Take good care of your feet: ? Wear comfortable shoes that fit well. ? Check your feet often for any cuts or sores.  Keep all follow-up visits as told by your doctor This is important. Contact a doctor if:  You have cramps in your legs when you walk.  You have leg pain when you are at rest.  You have coldness in a leg or foot.  Your skin changes.  You are unable to get or have an erection (erectile dysfunction).  You have cuts or sores on your feet that do not heal. Get help right away if:  Your arm or leg turns cold, numb, and blue.  Your arms or legs become red, warm, swollen, painful, or numb.  You have chest pain.  You have trouble breathing.  You suddenly have weakness in your face, arm, or leg.  You become very  confused or you cannot speak.  You suddenly have a very bad headache.  You suddenly cannot see. Summary  Peripheral vascular disease (PVD) is a disease of the blood vessels.  A simple term for PVD is poor circulation. Without treatment, PVD tends to get worse.  Treatment may include exercise, low fat and low cholesterol diet, and quitting smoking. This information is not intended to replace advice given to you by your health care provider. Make sure you discuss any questions you have with your health care provider. Document Released: 12/31/2009 Document Revised: 09/18/2017 Document Reviewed: 11/13/2016 Elsevier Patient Education  2020 Elsevier Inc.  

## 2019-07-05 NOTE — Progress Notes (Signed)
MRN : [id number]  Heather Mahoney is a 53 y.o. (1966-07-26) female who presents with chief complaint of  Chief Complaint  Patient presents with  . Follow-up    ultrasound follow up  .  History of Present Illness: Patient returns today in follow up of PAD.  She is about a year status post abdominal aortic aneurysm repair and subsequent right femoral thromboendarterectomy.  She still having a lot of claudication type symptoms in her legs with short distances of walking of about 100 feet or less.  No ulceration or infection.  Working 5 or 6 hours is debilitating and she is really having a difficult time at work due to all the walking.  Her ABIs today are 0.89 on the right and 0.91 on the left.  Right SFA stenosis is identified by duplex in the 40 to 59% range.  No obvious stenosis is identified in the left lower extremity.  Current Outpatient Medications  Medication Sig Dispense Refill  . amLODipine (NORVASC) 10 MG tablet Take 1 tablet (10 mg total) by mouth daily. 90 tablet 3  . aspirin 81 MG EC tablet Take 1 tablet (81 mg total) by mouth daily. 90 tablet 3  . atorvastatin (LIPITOR) 80 MG tablet Take 1 tablet (80 mg total) by mouth daily. 90 tablet 3  . clopidogrel (PLAVIX) 75 MG tablet Take 1 tablet (75 mg total) by mouth daily. 30 tablet 12  . docusate sodium (COLACE) 100 MG capsule Take 1 capsule (100 mg total) by mouth daily. 10 capsule 0  . ezetimibe (ZETIA) 10 MG tablet Take 1 tablet (10 mg total) by mouth daily. 30 tablet 2  . losartan (COZAAR) 100 MG tablet Take 1 tablet by mouth once daily 30 tablet 2  . metFORMIN (GLUCOPHAGE) 500 MG tablet Take 500 mg by mouth 2 (two) times daily with a meal.    . metoprolol succinate (TOPROL-XL) 25 MG 24 hr tablet Take 3 tablets (75 mg total) by mouth daily. 90 tablet 2  . nitroGLYCERIN (NITROSTAT) 0.4 MG SL tablet Place 1 tablet (0.4 mg total) under the tongue every 5 (five) minutes as needed for chest pain. 25 tablet 3   No current  facility-administered medications for this visit.     Past Medical History:  Diagnosis Date  . CAD (coronary artery disease)    a. PCI to proximal and distal RCA 3/26  . GERD (gastroesophageal reflux disease)   . Heart murmur    MR  . Hiatal hernia   . Hyperlipidemia   . Hypertension   . Tobacco abuse    a. Started at age 33, quit during 3 pregnancies. b. 1 PPD for a 40 pack-year hx (2019)     Past Surgical History:  Procedure Laterality Date  . BREAST SURGERY  1986   I&D for 'milk duct'  . CORONARY STENT INTERVENTION N/A 01/13/2019   Procedure: CORONARY STENT INTERVENTION;  Surgeon: Nelva Bush, MD;  Location: [place] [facility];  Service: Cardiovascular;  Laterality: N/A;  . EMBOLECTOMY  08/13/2018   Procedure: Right SFA and profunda femoris Fogarty embolectomy 3  Fogarty embolectomy balloon;  Surgeon: Algernon Huxley, MD;  Location: [facility];  Service: Vascular;;  . ENDARTERECTOMY FEMORAL Right 08/13/2018   Procedure: Right common femoral, profunda femoris, and superficial femoral artery endarterectomies and patch angioplasty ;  Surgeon: Algernon Huxley, MD;  Location: [facility];  Service: Vascular;  Laterality: Right;  . ENDOVASCULAR REPAIR/STENT GRAFT N/A 08/13/2018   Procedure: ENDOVASCULAR REPAIR/STENT  GRAFT;  Surgeon: Katha Cabal, MD;  Location: [place] [facility];  Service: Cardiovascular;  Laterality: N/A;  . LEFT HEART CATH AND CORONARY ANGIOGRAPHY Left 01/13/2019   Procedure: LEFT HEART CATH AND CORONARY ANGIOGRAPHY;  Surgeon: Minna Merritts, MD;  Location: [place] [facility];  Service: Cardiovascular;  Laterality: Left;  . SALPINGOOPHORECTOMY Left 2000    Social History Social History   Tobacco Use  . Smoking status: Former Smoker    Packs/day: 0.25    Years: 38.00    Pack years: 9.50    Types: Cigarettes    Quit date: 08/10/2018    Years since quitting: 0.9  . Smokeless tobacco: Never Used  Substance Use Topics  . Alcohol use: Yes    Comment:  occassional  . Drug use: Yes    Types: Marijuana    Comment: smokes Marijuana 'when I cant sleep, maybe once or twice a week'     Family History Family History  Problem Relation Age of Onset  . Diabetes Mother   . Hypertension Mother   . Hyperlipidemia Mother   . Heart disease Mother        CABG X 4  . Asthma Mother   . Sarcoidosis Mother        In remission  . Heart attack Mother   . Diabetes Maternal Grandmother   . Heart disease Maternal Grandmother   . Hyperlipidemia Maternal Grandmother     Allergies  Allergen Reactions  . Strawberry Extract Hives and Swelling  . Strawberry Flavor Rash     REVIEW OF SYSTEMS (Negative unless checked)  Constitutional: [] Weight loss  [] Fever  [] Chills Cardiac: [] Chest pain   [] Chest pressure   [] Palpitations   [] Shortness of breath when laying flat   [] Shortness of breath at rest   [] Shortness of breath with exertion. Vascular:  [x] Pain in legs with walking   [] Pain in legs at rest   [] Pain in legs when laying flat   [x] Claudication   [] Pain in feet when walking  [] Pain in feet at rest  [] Pain in feet when laying flat   [] History of DVT   [] Phlebitis   [] Swelling in legs   [] Varicose veins   [] Non-healing ulcers Pulmonary:   [] Uses home oxygen   [] Productive cough   [] Hemoptysis   [] Wheeze  [] COPD   [] Asthma Neurologic:  [] Dizziness  [] Blackouts   [] Seizures   [] History of stroke   [] History of TIA  [] Aphasia   [] Temporary blindness   [] Dysphagia   [] Weakness or numbness in arms   [] Weakness or numbness in legs Musculoskeletal:  [x] Arthritis   [] Joint swelling   [] Joint pain   [x] Low back pain Hematologic:  [] Easy bruising  [] Easy bleeding   [] Hypercoagulable state   [] Anemic   Gastrointestinal:  [] Blood in stool   [] Vomiting blood  [] Gastroesophageal reflux/heartburn   [] Abdominal pain Genitourinary:  [] Chronic kidney disease   [] Difficult urination  [] Frequent urination  [] Burning with urination   [] Hematuria Skin:  [] Rashes   [] Ulcers    [] Wounds Psychological:  [] History of anxiety   []  History of major depression.  Physical Examination  BP (!) 187/105 (BP Location: Right Arm)   Pulse 74   Resp 16   Wt 170 lb (77.1 kg)   BMI 27.44 kg/m  Gen:  WD/WN, NAD Head: [place]/AT, No temporalis wasting. Ear/Nose/Throat: Hearing grossly intact, nares w/o erythema or drainage Eyes: Conjunctiva clear. Sclera non-icteric Neck: Supple.  Trachea midline Pulmonary:  Good air movement, no use of  accessory muscles.  Cardiac: RRR, no JVD Vascular:  Vessel Right Left  Radial Palpable Palpable                          PT 1+ Palpable 1+ Palpable  DP 1+ Palpable Palpable   Gastrointestinal: soft, non-tender/non-distended. No guarding/reflex.  Musculoskeletal: M/S 5/5 throughout.  No deformity or atrophy. No edema. Neurologic: Sensation grossly intact in extremities.  Symmetrical.  Speech is fluent.  Psychiatric: Judgment intact, Mood & affect appropriate for pt's clinical situation. Dermatologic: No rashes or ulcers noted.  No cellulitis or open wounds.       Labs Recent Results (from the past 2160 hour(s))  Basic metabolic panel     Status: Abnormal   Collection Time: 04/09/19  4:44 PM  Result Value Ref Range   Sodium 135 135 - 145 mmol/L   Potassium 3.5 3.5 - 5.1 mmol/L   Chloride 102 98 - 111 mmol/L   CO2 23 22 - 32 mmol/L   Glucose, Bld 102 (H) 70 - 99 mg/dL   BUN 10 6 - 20 mg/dL   Creatinine, Ser 0.67 0.44 - 1.00 mg/dL   Calcium 9.1 8.9 - 10.3 mg/dL   GFR calc non Af Amer >60 >60 mL/min   GFR calc Af Amer >60 >60 mL/min   Anion gap 10 5 - 15    Comment: Performed at [facility], [address] [place], [place] [address]  CBC     Status: Abnormal   Collection Time: 04/09/19  4:44 PM  Result Value Ref Range   WBC 10.5 4.0 - 10.5 K/uL   RBC 4.42 3.87 - 5.11 MIL/uL   Hemoglobin 11.6 (L) 12.0 - 15.0 g/dL   HCT 35.8 (L) 36.0 - 46.0 %   MCV 81.0 80.0 - 100.0 fL   MCH 26.2 26.0 - 34.0 pg   MCHC 32.4  30.0 - 36.0 g/dL   RDW 15.6 (H) 11.5 - 15.5 %   Platelets 488 (H) 150 - 400 K/uL   nRBC 0.0 0.0 - 0.2 %    Comment: Performed at [facility], [address] [place], [place] [address]  Troponin I - ONCE - STAT     Status: None   Collection Time: 04/09/19  4:44 PM  Result Value Ref Range   Troponin I <0.03 <0.03 ng/mL    Comment: Performed at [facility], [address] [place], [place] [address]  Novel Coronavirus,NAA,(SEND-OUT TO REF LAB - TAT 24-48 hrs); Hosp Order     Status: None   Collection Time: 04/09/19  7:03 PM   Specimen: Nasopharyngeal Swab; Respiratory  Result Value Ref Range   SARS-CoV-2, NAA NOT DETECTED NOT DETECTED    Comment: (NOTE) Testing was performed using the cobas(R) SARS-CoV-2 test. This test was developed and its performance characteristics determined by [employer]. This test has not been FDA cleared or approved. This test has been authorized by FDA under an Emergency Use Authorization (EUA). This test is only authorized for the duration of time the declaration that circumstances exist justifying the authorization of the emergency use of in vitro diagnostic tests for detection of SARS-CoV-2 virus and/or diagnosis of COVID-19 infection under section 564(b)(1) of the Act, 21 U.S.C. [id number]), unless the authorization is terminated or revoked sooner. When diagnostic testing is negative, the possibility of a false negative result should be considered in the context of a patient's recent exposures and the presence of clinical signs and  symptoms consistent with COVID-19. An individual without symptoms of COVID-19 and who is not shedding SARS-CoV-2 virus would expect to have  a negative (not detected) result in this assay. Performed At: [facility] [place], [place] [id number] Rush Farmer MD [id number]    Coronavirus Source NASOPHARYNGEAL     Comment: Performed at [facility], [address] [place], [place] [address]  Troponin I - Now Then [facility]     Status: None   Collection Time: 04/09/19 10:42 PM  Result Value Ref Range   Troponin I <0.03 <0.03 ng/mL    Comment: Performed at [facility], [address] [place], [place] [address]  Troponin I - Now Then Q6H     Status: None   Collection Time: 04/10/19  5:01 AM  Result Value Ref Range   Troponin I <0.03 <0.03 ng/mL    Comment: Performed at [facility], [address] [place], [place] [address]  Lipid panel     Status: Abnormal   Collection Time: 04/10/19  5:01 AM  Result Value Ref Range   Cholesterol 200 0 - 200 mg/dL   Triglycerides 82 <150 mg/dL   HDL 55 >40 mg/dL   Total CHOL/HDL Ratio 3.6 RATIO   VLDL 16 0 - 40 mg/dL   LDL Cholesterol 129 (H) 0 - 99 mg/dL    Comment:        Total Cholesterol/HDL:CHD Risk Coronary Heart Disease Risk Table                     Men   Women  1/2 Average Risk   3.4   3.3  Average Risk       5.0   4.4  2 X Average Risk   9.6   7.1  3 X Average Risk  23.4   11.0        Use the calculated Patient Ratio above and the CHD Risk Table to determine the patient's CHD Risk.        ATP III CLASSIFICATION (LDL):  <100     mg/dL   Optimal  100-129  mg/dL   Near or Above                    Optimal  130-159  mg/dL   Borderline  160-189  mg/dL   High  >190     mg/dL   Very High Performed at [facility], [address] [place], [place] [address]   Troponin I - Now Then Q6H     Status: None   Collection Time: 04/10/19 10:43 AM  Result Value Ref Range   Troponin I <0.03 <0.03 ng/mL    Comment: Performed at [facility], [address] [place], [place] [address]  TSH     Status: Abnormal   Collection Time: 04/10/19 10:43 AM  Result Value Ref Range   TSH 4.946 (H) 0.350 - 4.500 uIU/mL    Comment: Performed by a 3rd Generation assay with a functional sensitivity of <=0.01 uIU/mL. Performed at [facility], [address] [place], [place] [address]   T4, free     Status: None   Collection Time: 04/10/19 12:54 PM  Result Value Ref Range   Free T4 0.85 0.61 - 1.12 ng/dL    Comment: (NOTE) Biotin ingestion may interfere with free T4 tests. If the results are inconsistent with the TSH level, previous test results, or the clinical presentation, then  consider biotin interference. If needed, order repeat testing after stopping biotin. Performed at [facility], [address] [place], [place] [id number]   Basic metabolic panel     Status: Abnormal   Collection Time: 04/11/19  6:53 AM  Result Value Ref Range   Sodium 139 135 - 145 mmol/L   Potassium 3.4 (L) 3.5 - 5.1 mmol/L   Chloride 105 98 - 111 mmol/L   CO2 22 22 - 32 mmol/L   Glucose, Bld 110 (H) 70 - 99 mg/dL   BUN 15 6 - 20 mg/dL   Creatinine, Ser 0.65 0.44 - 1.00 mg/dL   Calcium 9.2 8.9 - 10.3 mg/dL   GFR calc non Af Amer >60 >60 mL/min   GFR calc Af Amer >60 >60 mL/min   Anion gap 12 5 - 15    Comment: Performed at [facility], [address] [place],  [address]    Radiology No results found.  Assessment/Plan  Hypertension blood pressure control important in reducing the progression of atherosclerotic disease. On appropriate oral medications.   Diabetes ([place]) blood glucose control important in reducing the progression of atherosclerotic disease. Also, involved in wound healing. On appropriate medications.   AAA (abdominal aortic aneurysm) ([place]) Status post repair almost a year ago.  To be checked again in the spring.  Still has some abdominal pain and unclear if it is related to this aneurysm.  PAD (peripheral artery disease) ([facility]) Her ABIs today are 0.89 on the right and 0.91 on the left.  Right SFA stenosis is identified by duplex in the 40 to 59% range.  No obvious stenosis is identified in the left lower extremity. She is still having some claudication symptoms.  Her aortic stent graft would certainly  complicate any attempts at repair, and with flow velocities in this range I am not sure that that would be of much benefit at this point.  Certainly, if her symptoms persist or worsen that may be considered in the future.  At this time, I will plan a 58-month follow-up with noninvasive studies    [name], MD  07/05/2019 11:11 AM    This note was created with Dragon medical transcription system.  Any errors from dictation are purely unintentional

## 2019-07-05 NOTE — Assessment & Plan Note (Signed)
blood pressure control important in reducing the progression of atherosclerotic disease. On appropriate oral medications.  

## 2019-07-05 NOTE — Assessment & Plan Note (Signed)
Her ABIs today are 0.89 on the right and 0.91 on the left.  Right SFA stenosis is identified by duplex in the 40 to 59% range.  No obvious stenosis is identified in the left lower extremity. She is still having some claudication symptoms.  Her aortic stent graft would certainly complicate any attempts at repair, and with flow velocities in this range I am not sure that that would be of much benefit at this point.  Certainly, if her symptoms persist or worsen that may be considered in the future.  At this time, I will plan a 69-month follow-up with noninvasive studies

## 2019-07-05 NOTE — Assessment & Plan Note (Signed)
blood glucose control important in reducing the progression of atherosclerotic disease. Also, involved in wound healing. On appropriate medications.  

## 2019-07-05 NOTE — Assessment & Plan Note (Signed)
Status post repair almost a year ago.  To be checked again in the spring.  Still has some abdominal pain and unclear if it is related to this aneurysm.

## 2019-07-26 ENCOUNTER — Telehealth: Payer: Self-pay | Admitting: Cardiovascular Disease

## 2019-07-26 MED ORDER — METOPROLOL SUCCINATE ER 25 MG PO TB24: 75.0000 mg | ORAL_TABLET | Freq: Every day | ORAL | 6 refills | Status: DC

## 2019-07-26 NOTE — Telephone Encounter (Signed)
Spoke with patient and let her know I would send in a 30-day supply of metoprolol to her pharmacy as requested. She was appreciative. Rx sent.

## 2019-07-26 NOTE — Telephone Encounter (Signed)
Pt c/o medication issue:  1. Name of Medication: metoprolol  25 mg po q d   2. How are you currently taking this medication (dosage and times per day)?    3. Are you having a reaction (difficulty breathing--STAT)?  No    4. What is your medication issue?    Patient wants to know if she is to continue to take it this if so please send to  [address]  30 day only !

## 2019-07-27 IMAGING — CT CT HEAR MORPH WITH CTA COR WITH SCORE WITH CA WITH CONTRAST AND
1 of 9 series · 2 of 20 positions shown, 3 images · IV contrast (APPLIED)
Comparison: Chest radiograph 11/04/2017
COMPARISON: Chest radiograph 11/04/2017

Addendum:
EXAM:
OVER-READ INTERPRETATION  CT CHEST

The following report is an over-read performed by radiologist Dr.
Pierrine Benazzouz [REDACTED] on 01/04/2019. This over-read
does not include interpretation of cardiac or coronary anatomy or
pathology. The coronary calcium score/coronary CTA interpretation by
the cardiologist is attached.
HISTORY: chest pain
Cardiac/Coronary  CT
TECHNIQUE: The patient was scanned on a Siemens Force scanner.
PROTOCOL: A 120 kV prospective scan was triggered in the descending thoracic
aorta at 111 HU's. Axial non-contrast 3 mm slices were carried out
through the heart. The data set was analyzed on a dedicated work
station and scored using the Agatson method. Gantry rotation speed
was 250 msecs and collimation was .6 mm. Beta blockade and 0.8 mg of
sl NTG was given. The 3D data set was reconstructed in 5% intervals
of the 67-82 % of the R-R cycle. Diastolic phases were analyzed on a
dedicated work station using MPR, MIP and VRT modes. The patient
received 90mL OMNIPAQUE IOHEXOL 350 MG/ML SOLN of contrast.

[Series 14: 325 ml · axial · 0.39mm/px · z∈[+1066,+1114]mm · 2 of 361 slices shown, 3 images]
[im 121/361  vessel]
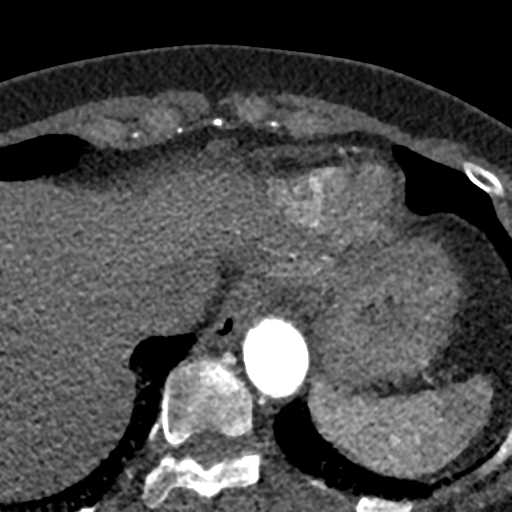
[im 121/361  lung]
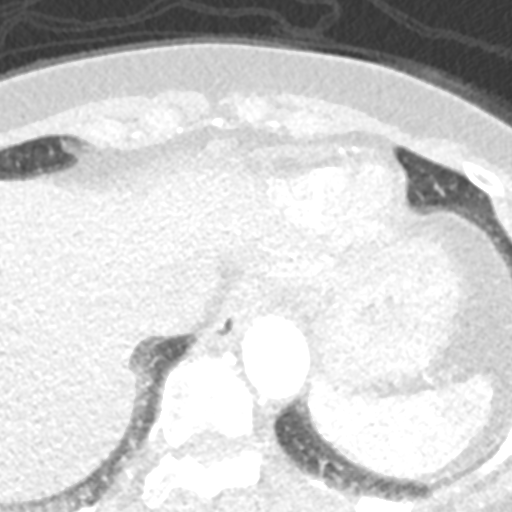
[im 241/361  vessel]
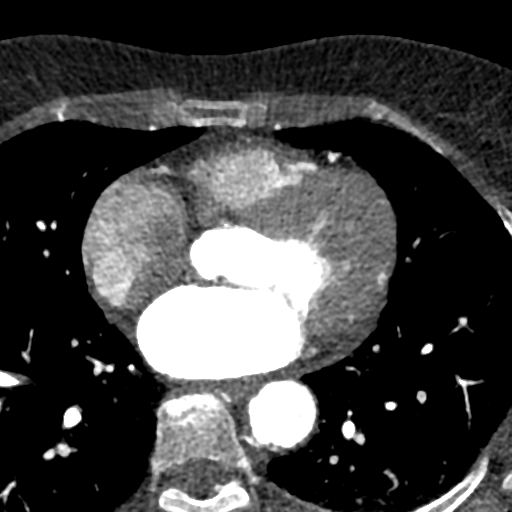

[2 of 20 positions shown; findings below may reference images not displayed]

FINDINGS: Vascular: Mild atherosclerotic disease in the descending thoracic
aorta. Normal caliber of the thoracic aorta without dissection.
Pulmonary arteries are not well opacified on this examination. No
significant pericardial fluid. Proximal abdominal aorta measures up
to 2.8 cm.

Mediastinum/Nodes: Left hilar tissue on sequence 11, image 3 is
prominent measuring 1.2 cm in the short axis. Right subcarinal
tissue is slightly prominent. Otherwise, no significant mediastinal
findings.

Lungs/Pleura: Lungs are clear.

Upper Abdomen: No acute abnormality in upper abdomen.

Musculoskeletal: No acute bone abnormality.
IMPRESSION: No acute abnormality.

Mildly prominent left hilar tissue is nonspecific.

Aortic Atherosclerosis (EDA69-HVH.H).
FINDINGS: Coronary calcium score: The patient's coronary artery calcium score
is 51, which places the patient in the 94 percentile.

Coronary arteries: Normal coronary origins.  Right dominance.

Right Coronary Artery: Proximal RCA atherosclerotic plaque with
moderate stenosis (50-69%). Possible severe stenosis in the mid RCA,
70-99 % stenosis, atherosclerotic plaque. This area is poorly
visualized due to motion and image resolution.

Left Main Coronary Artery: No detectable plaque or stenosis.

Left Anterior Descending Coronary Artery: Tubular plaque progresses
from mild to severe stenosis in proximal LAD. At narrowest point,
severe mixed atherosclerotic plaque with positive remodeling, 70-99%
stenosis. Mild diffuse plaque in mid and distal LAD.

Left Circumflex Artery: Severe stenosis in the proximal L circumflex
with atherosclerotic plaque, 70-99% stenosis.

Aorta: Normal size, 34 mm at the mid ascending aorta (level of the
PA bifurcation) measured double oblique. No calcifications. No
dissection.

Aortic Valve: No calcifications.

Other findings:

Normal pulmonary vein drainage into the left atrium.

Normal left atrial appendage without a thrombus.

Normal size of the pulmonary artery.
IMPRESSION: 1. Severe CAD, CADRADS = 4V. Severe atherosclerotic plaque with
70-99% stenosis in the proximal LAD, proximal L circumflex and mid
RCA. Positive remodeling with spotty calcifications in the proximal
LAD, suggesting vulnerable plaque characteristics.

2. The patient's coronary artery calcium score is 51, which places
the patient in the 94 percentile.

3. Normal coronary origin with right dominance.

*** End of Addendum ***
EXAM:
OVER-READ INTERPRETATION  CT CHEST

The following report is an over-read performed by radiologist Dr.
Pierrine Benazzouz [REDACTED] on 01/04/2019. This over-read
does not include interpretation of cardiac or coronary anatomy or
pathology. The coronary calcium score/coronary CTA interpretation by
the cardiologist is attached.
FINDINGS: Vascular: Mild atherosclerotic disease in the descending thoracic
aorta. Normal caliber of the thoracic aorta without dissection.
Pulmonary arteries are not well opacified on this examination. No
significant pericardial fluid. Proximal abdominal aorta measures up
to 2.8 cm.

Mediastinum/Nodes: Left hilar tissue on sequence 11, image 3 is
prominent measuring 1.2 cm in the short axis. Right subcarinal
tissue is slightly prominent. Otherwise, no significant mediastinal
findings.

Lungs/Pleura: Lungs are clear.

Upper Abdomen: No acute abnormality in upper abdomen.

Musculoskeletal: No acute bone abnormality.
IMPRESSION: No acute abnormality.

Mildly prominent left hilar tissue is nonspecific.

Aortic Atherosclerosis (EDA69-HVH.H).

## 2019-07-28 ENCOUNTER — Telehealth: Payer: Self-pay

## 2019-07-28 NOTE — Telephone Encounter (Signed)
Pre-visit screening call attempted prior to [facility] appointment on 08/03/2019. No answer / message left.

## 2019-08-02 ENCOUNTER — Other Ambulatory Visit: Payer: Self-pay

## 2019-08-03 ENCOUNTER — Ambulatory Visit: Payer: Medicaid Other | Attending: Oncology | Admitting: *Deleted

## 2019-08-03 ENCOUNTER — Encounter: Payer: Self-pay | Admitting: *Deleted

## 2019-08-03 ENCOUNTER — Other Ambulatory Visit: Payer: Self-pay

## 2019-08-03 ENCOUNTER — Ambulatory Visit
Admission: RE | Admit: 2019-08-03 | Discharge: 2019-08-03 | Disposition: A | Discharge status: Home / Self Care | Payer: Medicaid Other | Source: Ambulatory Visit | Attending: Oncology | Admitting: Oncology

## 2019-08-03 VITALS — BP 166/117 | HR 70 | Temp 97.9°F | Ht 66.0 in | Wt 175.0 lb

## 2019-08-03 DIAGNOSIS — Z0000 Encounter for general adult medical examination without abnormal findings: Secondary | ICD-10-CM | POA: Insufficient documentation

## 2019-08-03 NOTE — Progress Notes (Signed)
  Subjective:     Patient ID: Heather Mahoney, female   DOB: 12/09/1965, 53 y.o.   MRN: [id number]  HPI   Review of Systems     Objective:   Physical Exam Chest:     Breasts:        Right: Inverted nipple present. No swelling, bleeding, mass, nipple discharge, skin change or tenderness.        Left: No swelling, bleeding, inverted nipple, mass, nipple discharge, skin change or tenderness.       Comments: Right inverted nipple.  Patient states normal since her biopsies. Lymphadenopathy:     Upper Body:     Right upper body: No supraclavicular or axillary adenopathy.     Left upper body: No supraclavicular or axillary adenopathy.        Assessment:     53 year old Black female referred to [place] from [facility] for clinical breast exam and mammogram.  Last pap smear on 11/08/15 was negative / negative.  Next pap due in 2022.  Clinical breast exam with inverted right nipple.  Patient states normal for her since her biopsy. Taught self breast awareness.  Blood pressure elevated at 166/117 and rechecked at 152/108.  She is to take her blood pressure meds as soon as possible and to recheck her blood pressure at [facility] or [facility], and if remains higher than 140/90 she is to follow-up with her primary care provider.  Hand out on hypertention given to patient.Patient has been screened for eligibility.  She does not have any insurance, Medicare or Medicaid.  She also meets financial eligibility.  Hand-out given on the Affordable Care Act.  Risk Assessment    Risk Scores      08/03/2019   Last edited by: Theodore Demark, RN   5-year risk: 1 %   Lifetime risk: 6.2 %            Plan:     Screening mammogram ordered.  Will follow up per [facility] protocol.

## 2019-08-03 NOTE — Patient Instructions (Addendum)
Gave patient hand-out, Women Staying Healthy, Active and Well from [place], with education on breast health, pap smears, heart and colon health. Hypertension, Adult High blood pressure (hypertension) is when the force of blood pumping through the arteries is too strong. The arteries are the blood vessels that carry blood from the heart throughout the body. Hypertension forces the heart to work harder to pump blood and may cause arteries to become narrow or stiff. Untreated or uncontrolled hypertension can cause a heart attack, heart failure, a stroke, kidney disease, and other problems. A blood pressure reading consists of a higher number over a lower number. Ideally, your blood pressure should be below 120/80. The first ("top") number is called the systolic pressure. It is a measure of the pressure in your arteries as your heart beats. The second ("bottom") number is called the diastolic pressure. It is a measure of the pressure in your arteries as the heart relaxes. What are the causes? The exact cause of this condition is not known. There are some conditions that result in or are related to high blood pressure. What increases the risk? Some risk factors for high blood pressure are under your control. The following factors may make you more likely to develop this condition:  Smoking.  Having type 2 diabetes mellitus, high cholesterol, or both.  Not getting enough exercise or physical activity.  Being overweight.  Having too much fat, sugar, calories, or salt (sodium) in your diet.  Drinking too much alcohol. Some risk factors for high blood pressure may be difficult or impossible to change. Some of these factors include:  Having chronic kidney disease.  Having a family history of high blood pressure.  Age. Risk increases with age.  Race. You may be at higher risk if you are African American.  Gender. Men are at higher risk than women before age 47. After age 55, women are at higher risk  than men.  Having obstructive sleep apnea.  Stress. What are the signs or symptoms? High blood pressure may not cause symptoms. Very high blood pressure (hypertensive crisis) may cause:  Headache.  Anxiety.  Shortness of breath.  Nosebleed.  Nausea and vomiting.  Vision changes.  Severe chest pain.  Seizures. How is this diagnosed? This condition is diagnosed by measuring your blood pressure while you are seated, with your arm resting on a flat surface, your legs uncrossed, and your feet flat on the floor. The cuff of the blood pressure monitor will be placed directly against the skin of your upper arm at the level of your heart. It should be measured at least twice using the same arm. Certain conditions can cause a difference in blood pressure between your right and left arms. Certain factors can cause blood pressure readings to be lower or higher than normal for a short period of time:  When your blood pressure is higher when you are in a health care provider's office than when you are at home, this is called white coat hypertension. Most people with this condition do not need medicines.  When your blood pressure is higher at home than when you are in a health care provider's office, this is called masked hypertension. Most people with this condition may need medicines to control blood pressure. If you have a high blood pressure reading during one visit or you have normal blood pressure with other risk factors, you may be asked to:  Return on a different day to have your blood pressure checked again.  Monitor  your blood pressure at home for 1 week or longer. If you are diagnosed with hypertension, you may have other blood or imaging tests to help your health care provider understand your overall risk for other conditions. How is this treated? This condition is treated by making healthy lifestyle changes, such as eating healthy foods, exercising more, and reducing your alcohol  intake. Your health care provider may prescribe medicine if lifestyle changes are not enough to get your blood pressure under control, and if:  Your systolic blood pressure is above 130.  Your diastolic blood pressure is above 80. Your personal target blood pressure may vary depending on your medical conditions, your age, and other factors. Follow these instructions at home: Eating and drinking   Eat a diet that is high in fiber and potassium, and low in sodium, added sugar, and fat. An example eating plan is called the DASH (Dietary Approaches to Stop Hypertension) diet. To eat this way: ? Eat plenty of fresh fruits and vegetables. Try to fill one half of your plate at each meal with fruits and vegetables. ? Eat whole grains, such as whole-wheat pasta, brown rice, or whole-grain bread. Fill about one fourth of your plate with whole grains. ? Eat or drink low-fat dairy products, such as skim milk or low-fat yogurt. ? Avoid fatty cuts of meat, processed or cured meats, and poultry with skin. Fill about one fourth of your plate with lean proteins, such as fish, chicken without skin, beans, eggs, or tofu. ? Avoid pre-made and processed foods. These tend to be higher in sodium, added sugar, and fat.  Reduce your daily sodium intake. Most people with hypertension should eat less than 1,500 mg of sodium a day.  Do not drink alcohol if: ? Your health care provider tells you not to drink. ? You are pregnant, may be pregnant, or are planning to become pregnant.  If you drink alcohol: ? Limit how much you use to:  0-1 drink a day for women.  0-2 drinks a day for men. ? Be aware of how much alcohol is in your drink. In the [place] one drink equals one 12 oz bottle of beer (355 mL), one 5 oz glass of wine (148 mL), or one 1 oz glass of hard liquor (44 mL). Lifestyle   Work with your health care provider to maintain a healthy body weight or to lose weight. Ask what an ideal weight is for  you.  Get at least 30 minutes of exercise most days of the week. Activities may include walking, swimming, or biking.  Include exercise to strengthen your muscles (resistance exercise), such as Pilates or lifting weights, as part of your weekly exercise routine. Try to do these types of exercises for 30 minutes at least 3 days a week.  Do not use any products that contain nicotine or tobacco, such as cigarettes, e-cigarettes, and chewing tobacco. If you need help quitting, ask your health care provider.  Monitor your blood pressure at home as told by your health care provider.  Keep all follow-up visits as told by your health care provider. This is important. Medicines  Take over-the-counter and prescription medicines only as told by your health care provider. Follow directions carefully. Blood pressure medicines must be taken as prescribed.  Do not skip doses of blood pressure medicine. Doing this puts you at risk for problems and can make the medicine less effective.  Ask your health care provider about side effects or reactions to medicines that  you should watch for. Contact a health care provider if you:  Think you are having a reaction to a medicine you are taking.  Have headaches that keep coming back (recurring).  Feel dizzy.  Have swelling in your ankles.  Have trouble with your vision. Get help right away if you:  Develop a severe headache or confusion.  Have unusual weakness or numbness.  Feel faint.  Have severe pain in your chest or abdomen.  Vomit repeatedly.  Have trouble breathing. Summary  Hypertension is when the force of blood pumping through your arteries is too strong. If this condition is not controlled, it may put you at risk for serious complications.  Your personal target blood pressure may vary depending on your medical conditions, your age, and other factors. For most people, a normal blood pressure is less than 120/80.  Hypertension is  treated with lifestyle changes, medicines, or a combination of both. Lifestyle changes include losing weight, eating a healthy, low-sodium diet, exercising more, and limiting alcohol. This information is not intended to replace advice given to you by your health care provider. Make sure you discuss any questions you have with your health care provider. Document Released: 10/06/2005 Document Revised: 06/16/2018 Document Reviewed: 06/16/2018 Elsevier Patient Education  2020 [employer].

## 2019-08-10 ENCOUNTER — Ambulatory Visit (INDEPENDENT_AMBULATORY_CARE_PROVIDER_SITE_OTHER): Payer: Self-pay

## 2019-08-10 ENCOUNTER — Telehealth: Payer: Self-pay

## 2019-08-10 ENCOUNTER — Other Ambulatory Visit: Payer: Self-pay

## 2019-08-10 DIAGNOSIS — I10 Essential (primary) hypertension: Secondary | ICD-10-CM

## 2019-08-10 NOTE — Telephone Encounter (Signed)
-----   Message from Rise Mu, PA-C sent at 08/10/2019  9:08 AM EDT ----- No evidence of renal artery stenosis bilaterally.

## 2019-08-10 NOTE — Telephone Encounter (Signed)
Patient returning call.

## 2019-08-10 NOTE — Telephone Encounter (Signed)
Patient made aware of renal artery duplex results with verbalized understanding.  6 mo f/u appt scheduled with Dr. Rockey Situ for 09/26/19 @ 8am.

## 2019-08-10 NOTE — Telephone Encounter (Signed)
Called to give the patient renal artery duplex results. [email or web address]. 

## 2019-08-22 ENCOUNTER — Encounter: Payer: Self-pay | Admitting: *Deleted

## 2019-08-22 NOTE — Progress Notes (Signed)
Letter mailed from [facility] to inform patient of her normal mammogram results.  Patient is to follow-up with annual screening in one year.  HSIS to [place]. 

## 2019-09-23 NOTE — Progress Notes (Signed)
Date:  09/23/2019   ID:  Heather Mahoney, Heather Mahoney October 13, 1966, MRN [id number]  Patient Location:  [address] [place] [address]   Provider location:   [facility], [place] office  PCP:  Alene Mires Elyse Jarvis, MD  Cardiologist:  Arvid Right [facility]  Chief Complaint  Patient presents with  . other    Pt. c/o bilateral leg pain when walking and chest discomfort when having to take a deep breath when walking. Meds reviewed by the pt. verbally.     History of Present Illness:    Heather Mahoney is a 53 y.o. female  past medical history of Smoker GERD HTN PVCs Fall and question of loss of consciousness 2017, secondary to orthostasis,  AAA with endovascular repair October 2019, access site on the right groin CAD, stent x2 to RCA March 2020, Access site left groin Who presents today for follow-up of her chest pain/angina, shortness of breath, CAD, results of her recent cardiac CT  Out of zetia Difficulty affording several of her medications Blood pressure at home consistently elevated, particularly diastolic Checks at work and home Works part-time at [employer] get a promotion to work full-time  EKG personally reviewed by myself on todays visit Shows normal sinus rhythm rate 64 bpm rare PVC no significant ST-T wave changes  Lab work from June 2020 reviewed  Other past medical history reviewed cardiac CTA showing stenosis in RCA by FFR   cardiac catheterization January 13, 2019  Cardiac cath 01/13/2019 Single-vessel disease notably of the RCA moderate to severe proximal  disease estimated 75% and distal RCA disease estimated at 80% There is disease of high OM vessel, small in caliber diffusely diseased Mild proximal LAD disease  --- stenting of the proximal RCA and distal RCA  subsequent left groin hematoma causing hypotension, vagal response Was kept for 2 days post procedure in the hospital Her   Medication intolerances  isosorbide as this  caused a headache   peripheral vascular catheterization in October 2019  AAA repair, endograft  She reports strong family history of coronary disease Mother with bypass surgery Her grandmother, mother side, passed away at the age of 63 from a brain aneurysm.    Prior CV studies:   The following studies were reviewed today:  Cardiac CTA cardiac CT scan was ordered showing severe stenosis mid RCA (FFR less than 0.7), indeterminate stenosis LAD and circumflex (FFR less than 0.8)   Past Medical History:  Diagnosis Date  . CAD (coronary artery disease)    a. PCI to proximal and distal RCA 3/26  . GERD (gastroesophageal reflux disease)   . Heart murmur    MR  . Hiatal hernia   . Hyperlipidemia   . Hypertension   . Tobacco abuse    a. Started at age 9, quit during 3 pregnancies. b. 1 PPD for a 40 pack-year hx (2019)    Past Surgical History:  Procedure Laterality Date  . BREAST BIOPSY Right    Multiple biopsies-all benign  . BREAST SURGERY  1986   I&D for 'milk duct'  . CORONARY STENT INTERVENTION N/A 01/13/2019   Procedure: CORONARY STENT INTERVENTION;  Surgeon: Nelva Bush, MD;  Location: [place];  Service: Cardiovascular;  Laterality: N/A;  . EMBOLECTOMY  08/13/2018   Procedure: Right SFA and profunda femoris Fogarty embolectomy 3  Fogarty embolectomy balloon;  Surgeon: Algernon Huxley, MD;  Location: [facility];  Service: Vascular;;  . ENDARTERECTOMY FEMORAL  Right 08/13/2018   Procedure: Right common femoral, profunda femoris, and superficial femoral artery endarterectomies and patch angioplasty ;  Surgeon: Algernon Huxley, MD;  Location: [facility];  Service: Vascular;  Laterality: Right;  . ENDOVASCULAR REPAIR/STENT GRAFT N/A 08/13/2018   Procedure: ENDOVASCULAR REPAIR/STENT GRAFT;  Surgeon: Katha Cabal, MD;  Location: [facility];  Service: Cardiovascular;  Laterality: N/A;  . LEFT HEART CATH AND CORONARY ANGIOGRAPHY Left 01/13/2019   Procedure:  LEFT HEART CATH AND CORONARY ANGIOGRAPHY;  Surgeon: Minna Merritts, MD;  Location: [facility];  Service: Cardiovascular;  Laterality: Left;  . SALPINGOOPHORECTOMY Left 2000     No outpatient medications have been marked as taking for the 09/26/19 encounter (Appointment) with Minna Merritts, MD.     Allergies:   Strawberry extract and Strawberry flavor   Social History   Tobacco Use  . Smoking status: Former Smoker    Packs/day: 0.25    Years: 38.00    Pack years: 9.50    Types: Cigarettes    Quit date: 08/10/2018    Years since quitting: 1.1  . Smokeless tobacco: Never Used  Substance Use Topics  . Alcohol use: Yes    Comment: occassional  . Drug use: Yes    Types: Marijuana    Comment: smokes Marijuana 'when I cant sleep, maybe once or twice a week'     Current Outpatient Medications on File Prior to Visit  Medication Sig Dispense Refill  . amLODipine (NORVASC) 10 MG tablet Take 1 tablet (10 mg total) by mouth daily. 90 tablet 3  . aspirin 81 MG EC tablet Take 1 tablet (81 mg total) by mouth daily. 90 tablet 3  . atorvastatin (LIPITOR) 80 MG tablet Take 1 tablet (80 mg total) by mouth daily. 90 tablet 3  . clopidogrel (PLAVIX) 75 MG tablet Take 1 tablet (75 mg total) by mouth daily. 30 tablet 12  . docusate sodium (COLACE) 100 MG capsule Take 1 capsule (100 mg total) by mouth daily. 10 capsule 0  . ezetimibe (ZETIA) 10 MG tablet Take 1 tablet (10 mg total) by mouth daily. 30 tablet 2  . losartan (COZAAR) 100 MG tablet Take 1 tablet by mouth once daily 30 tablet 2  . metFORMIN (GLUCOPHAGE) 500 MG tablet Take 500 mg by mouth 2 (two) times daily with a meal.    . metoprolol succinate (TOPROL-XL) 25 MG 24 hr tablet Take 3 tablets (75 mg total) by mouth daily. 90 tablet 6  . nitroGLYCERIN (NITROSTAT) 0.4 MG SL tablet Place 1 tablet (0.4 mg total) under the tongue every 5 (five) minutes as needed for chest pain. 25 tablet 3   No current facility-administered  medications on file prior to visit.      Family Hx: The patient's family history includes Asthma in her mother; Diabetes in her maternal grandmother and mother; Heart attack in her mother; Heart disease in her maternal grandmother and mother; Hyperlipidemia in her maternal grandmother and mother; Hypertension in her mother; Sarcoidosis in her mother.  ROS:   Please see the history of present illness.    Review of Systems  Constitutional: Positive for malaise/fatigue.  Respiratory: Negative.   Cardiovascular: Positive for chest pain.  Gastrointestinal: Negative.   Musculoskeletal: Negative.   Neurological: Negative.   Psychiatric/Behavioral: Negative.   All other systems reviewed and are negative.     Labs/Other Tests and Data Reviewed:    Recent Labs: 01/08/2019: ALT 16 04/09/2019: Hemoglobin 11.6; Platelets 488 04/10/2019:  TSH 4.946 04/11/2019: BUN 15; Creatinine, Ser 0.65; Potassium 3.4; Sodium 139   Recent Lipid Panel Lab Results  Component Value Date/Time   CHOL 200 04/10/2019 05:01 AM   CHOL 266 (H) 12/06/2018 10:17 AM   TRIG 82 04/10/2019 05:01 AM   HDL 55 04/10/2019 05:01 AM   HDL 64 12/06/2018 10:17 AM   CHOLHDL 3.6 04/10/2019 05:01 AM   LDLCALC 129 (H) 04/10/2019 05:01 AM   LDLCALC 178 (H) 12/06/2018 10:17 AM   LDLDIRECT 185 (H) 12/06/2018 10:17 AM    Wt Readings from Last 3 Encounters:  08/03/19 175 lb (79.4 kg)  07/05/19 170 lb (77.1 kg)  04/19/19 171 lb 8 oz (77.8 kg)     Exam:   BP (!) 152/100 (BP Location: Left Arm, Patient Position: Sitting, Cuff Size: Normal)   Pulse 64   Temp (!) 97.3 F (36.3 C)   Ht 5\' 4"  (1.626 m)   Wt 172 lb 4 oz (78.1 kg)   BMI 29.57 kg/m   Constitutional:  oriented to person, place, and time. No distress.  HENT:  Head: Grossly normal Eyes:  no discharge. No scleral icterus.  Neck: No JVD, no carotid bruits  Cardiovascular: Regular rate and rhythm, no murmurs appreciated Pulmonary/Chest: Clear to auscultation  bilaterally, no wheezes or rails Abdominal: Soft.  no distension.  no tenderness.  Musculoskeletal: Normal range of motion Neurological:  normal muscle tone. Coordination normal. No atrophy Skin: Skin warm and dry Psychiatric: normal affect, pleasant   ASSESSMENT & PLAN:    Coronary artery disease of native artery of native heart with stable angina pectoris (HCC) Recent stent placement x2,  Recommend she continue working, do not forget her walking/exercise program Discussed medications prices good [email or web address]  PAD (peripheral artery disease) ([place]) AAA with endograft placement , recovered well Followed by Dr. Lucky Cowboy  Abdominal aortic aneurysm (AAA) without rupture ([place]) Monitored by Dr. Lucky Cowboy  Essential hypertension, benign Blood pressure improving but still moderately elevated We will add clonidine 0.1 mg twice daily, prescriptions written for other medications Recommend she call us in the next week or 2 with blood pressure measurements  Mixed hyperlipidemia Continue Lipitor 80 with Zetia  Tobacco use disorder Smoking cessation recommended   Disposition: Follow-up in 6 months   Total encounter time more than 25 minutes  Greater than 50% was spent in counseling and coordination of care with the patient    Signed, Ida Rogue, MD  09/23/2019 7:08 PM    [place] [facility] [address], [place], [place] [address]

## 2019-09-26 ENCOUNTER — Encounter: Payer: Self-pay | Admitting: Cardiovascular Disease

## 2019-09-26 ENCOUNTER — Other Ambulatory Visit: Payer: Self-pay

## 2019-09-26 ENCOUNTER — Ambulatory Visit (INDEPENDENT_AMBULATORY_CARE_PROVIDER_SITE_OTHER): Payer: Self-pay | Admitting: Cardiovascular Disease

## 2019-09-26 VITALS — BP 152/100 | HR 64 | Temp 97.3°F | Ht 64.0 in | Wt 172.2 lb

## 2019-09-26 DIAGNOSIS — I739 Peripheral vascular disease, unspecified: Secondary | ICD-10-CM

## 2019-09-26 DIAGNOSIS — I10 Essential (primary) hypertension: Secondary | ICD-10-CM

## 2019-09-26 DIAGNOSIS — I714 Abdominal aortic aneurysm, without rupture, unspecified: Secondary | ICD-10-CM

## 2019-09-26 DIAGNOSIS — E782 Mixed hyperlipidemia: Secondary | ICD-10-CM

## 2019-09-26 DIAGNOSIS — I25118 Atherosclerotic heart disease of native coronary artery with other forms of angina pectoris: Secondary | ICD-10-CM

## 2019-09-26 DIAGNOSIS — F17200 Nicotine dependence, unspecified, uncomplicated: Secondary | ICD-10-CM

## 2019-09-26 DIAGNOSIS — E785 Hyperlipidemia, unspecified: Secondary | ICD-10-CM

## 2019-09-26 MED ORDER — METOPROLOL SUCCINATE ER 50 MG PO TB24: 50.0000 mg | ORAL_TABLET | Freq: Every day | ORAL | 3 refills | Status: DC

## 2019-09-26 MED ORDER — AMLODIPINE BESYLATE 10 MG PO TABS: 10.0000 mg | ORAL_TABLET | Freq: Every day | ORAL | 3 refills | Status: DC

## 2019-09-26 MED ORDER — EZETIMIBE 10 MG PO TABS: 10.0000 mg | ORAL_TABLET | Freq: Every day | ORAL | 3 refills | Status: DC

## 2019-09-26 MED ORDER — CLONIDINE HCL 0.1 MG PO TABS: 0.1000 mg | ORAL_TABLET | Freq: Two times a day (BID) | ORAL | 3 refills | Status: DC

## 2019-09-26 MED ORDER — LOSARTAN POTASSIUM 100 MG PO TABS: 100.0000 mg | ORAL_TABLET | Freq: Every day | ORAL | 3 refills | Status: DC

## 2019-09-26 MED ORDER — CLOPIDOGREL BISULFATE 75 MG PO TABS: 75.0000 mg | ORAL_TABLET | Freq: Every day | ORAL | 3 refills | Status: DC

## 2019-09-26 NOTE — Patient Instructions (Addendum)
Medication Instructions:  Please start clonidine 0.1 mg twice a day Continue other medications  If you need a refill on your cardiac medications before your next appointment, please call your pharmacy.    Lab work: No new labs needed   If you have labs (blood work) drawn today and your tests are completely normal, you will receive your results only by: Marland Kitchen MyChart Message (if you have MyChart) OR . A paper copy in the mail If you have any lab test that is abnormal or we need to change your treatment, we will call you to review the results.   Testing/Procedures: No new testing needed   Follow-Up: At [facility], you and your health needs are our priority.  As part of our continuing mission to provide you with exceptional heart care, we have created designated Provider Care Teams.  These Care Teams include your primary Cardiologist (physician) and Advanced Practice Providers (APPs -  Physician Assistants and Nurse Practitioners) who all work together to provide you with the care you need, when you need it.  . You will need a follow up appointment in 6 months .   Please call our office 2 months in advance to schedule this appointment.    . Providers on your designated Care Team:   . Murray Hodgkins, NP . Christell Faith, PA-C . Marrianne Mood, PA-C  Any Other Special Instructions Will Be Listed Below (If Applicable).  For educational health videos Log in to : [email or web address] Or : [email or web address], password : [email or web address]   How to Take Your Blood Pressure You can take your blood pressure at home with a machine. You may need to check your blood pressure at home:  To check if you have high blood pressure (hypertension).  To check your blood pressure over time.  To make sure your blood pressure medicine is working. Supplies needed: You will need a blood pressure machine, or monitor. You can buy one at a drugstore or online. When choosing one:  Choose one with an arm cuff.  Choose  one that wraps around your upper arm. Only one finger should fit between your arm and the cuff.  Do not choose one that measures your blood pressure from your wrist or finger. Your doctor can suggest a monitor. How to prepare Avoid these things for 30 minutes before checking your blood pressure:  Drinking caffeine.  Drinking alcohol.  Eating.  Smoking.  Exercising. Five minutes before checking your blood pressure:  Pee.  Sit in a dining chair. Avoid sitting in a soft couch or armchair.  Be quiet. Do not talk. How to take your blood pressure Follow the instructions that came with your machine. If you have a digital blood pressure monitor, these may be the instructions: 1. Sit up straight. 2. Place your feet on the floor. Do not cross your ankles or legs. 3. Rest your left arm at the level of your heart. You may rest it on a table, desk, or chair. 4. Pull up your shirt sleeve. 5. Wrap the blood pressure cuff around the upper part of your left arm. The cuff should be 1 inch (2.5 cm) above your elbow. It is best to wrap the cuff around bare skin. 6. Fit the cuff snugly around your arm. You should be able to place only one finger between the cuff and your arm. 7. Put the cord inside the groove of your elbow. 8. Press the power button. 9. Sit quietly while the cuff fills with air and loses  air. 10. Write down the numbers on the screen. 11. Wait 2-3 minutes and then repeat steps 1-10. What do the numbers mean? Two numbers make up your blood pressure. The first number is called systolic pressure. The second is called diastolic pressure. An example of a blood pressure reading is "120 over 80" (or 120/80). If you are an adult and do not have a medical condition, use this guide to find out if your blood pressure is normal: Normal  First number: below 120.  Second number: below 80. Elevated  First number: 120-129.  Second number: below 80. Hypertension stage 1  First number:  130-139.  Second number: 80-89. Hypertension stage 2  First number: 140 or above.  Second number: 72 or above. Your blood pressure is above normal even if only the top or bottom number is above normal. Follow these instructions at home:  Check your blood pressure as often as your doctor tells you to.  Take your monitor to your next doctor's appointment. Your doctor will: ? Make sure you are using it correctly. ? Make sure it is working right.  Make sure you understand what your blood pressure numbers should be.  Tell your doctor if your medicines are causing side effects. Contact a doctor if:  Your blood pressure keeps being high. Get help right away if:  Your first blood pressure number is higher than 180.  Your second blood pressure number is higher than 120. This information is not intended to replace advice given to you by your health care provider. Make sure you discuss any questions you have with your health care provider. Document Released: 09/18/2008 Document Revised: 09/18/2017 Document Reviewed: 03/14/2016 Elsevier Patient Education  2020 [employer].

## 2019-10-05 ENCOUNTER — Encounter: Payer: Self-pay | Admitting: Cardiovascular Disease

## 2019-10-05 NOTE — Telephone Encounter (Signed)
This encounter was created in error - please disregard.

## 2019-10-25 ENCOUNTER — Other Ambulatory Visit: Payer: Self-pay

## 2019-10-25 ENCOUNTER — Encounter: Payer: Self-pay | Admitting: Cardiovascular Disease

## 2019-10-25 NOTE — Telephone Encounter (Signed)
*  STAT* If patient is at the pharmacy, call can be transferred to refill team.   1. Which medications need to be refilled? (please list name of each medication and dose if known) Amlodipine, Lipitor, Clonidine, Plavix, Losartan, Metoprolol 2. Which pharmacy/location (including street and city if local pharmacy) is medication to be sent to? [place]  3. Do they need a 30 day or 90 day supply? 90 Patient states that all her rx has to be name brand, cannot be generic

## 2019-10-25 NOTE — Telephone Encounter (Signed)
Patient states with her new insurance, all her rx has to be brand name, cannot be generic. This encounter was created in error - please disregard.

## 2019-10-26 ENCOUNTER — Telehealth: Payer: Self-pay

## 2019-10-26 MED ORDER — CLONIDINE HCL 0.1 MG PO TABS: 0.1000 mg | ORAL_TABLET | Freq: Two times a day (BID) | ORAL | 1 refills | Status: DC

## 2019-10-26 MED ORDER — TOPROL XL 50 MG PO TB24: 50.0000 mg | ORAL_TABLET | Freq: Every day | ORAL | 0 refills | Status: DC

## 2019-10-26 MED ORDER — PLAVIX 75 MG PO TABS: 75.0000 mg | ORAL_TABLET | Freq: Every day | ORAL | 0 refills | Status: DC

## 2019-10-26 MED ORDER — LIPITOR 80 MG PO TABS: 80.0000 mg | ORAL_TABLET | Freq: Every day | ORAL | 0 refills | Status: DC

## 2019-10-26 MED ORDER — CLOPIDOGREL BISULFATE 75 MG PO TABS: 75.0000 mg | ORAL_TABLET | Freq: Every day | ORAL | 1 refills | Status: DC

## 2019-10-26 MED ORDER — ATORVASTATIN CALCIUM 80 MG PO TABS: 80.0000 mg | ORAL_TABLET | Freq: Every day | ORAL | 1 refills | Status: DC

## 2019-10-26 MED ORDER — NORVASC 10 MG PO TABS: 10.0000 mg | ORAL_TABLET | Freq: Every day | ORAL | 0 refills | Status: DC

## 2019-10-26 MED ORDER — LOSARTAN POTASSIUM 100 MG PO TABS: 100.0000 mg | ORAL_TABLET | Freq: Every day | ORAL | 0 refills | Status: DC

## 2019-10-26 MED ORDER — COZAAR 100 MG PO TABS: 100.0000 mg | ORAL_TABLET | Freq: Every day | ORAL | 0 refills | Status: DC

## 2019-10-26 MED ORDER — CATAPRES 0.1 MG PO TABS: 0.1000 mg | ORAL_TABLET | Freq: Two times a day (BID) | ORAL | 0 refills | Status: DC

## 2019-10-26 MED ORDER — AMLODIPINE BESYLATE 10 MG PO TABS: 10.0000 mg | ORAL_TABLET | Freq: Every day | ORAL | 1 refills | Status: DC

## 2019-10-26 MED ORDER — LOSARTAN POTASSIUM 100 MG PO TABS: 100.0000 mg | ORAL_TABLET | Freq: Every day | ORAL | 1 refills | Status: DC

## 2019-10-26 NOTE — Telephone Encounter (Signed)
Per patient, insurance company cost for [facility] name drugs is $4 per month so she would like all medications to be sent in as brand name only. Sent to pharmacy as requested.  She also notes she was advised to take Toprol XL 50mg  once daily instead of 75mg  daily due to the addition of clonidine. Last office note from 09/26/2019 does not note this change. Please advise on which dose to send to pharmacy.

## 2019-10-26 NOTE — Telephone Encounter (Signed)
Please change medication orders to brand name per patient. Patient states her insurance is requiring brand name drugs only on her plan this year: metoprolol, clonidine, atorvastatin, clopidogrel, losartan, amlodipine, and ezetimibe.

## 2019-10-26 NOTE — Telephone Encounter (Signed)
Left message requesting to have patient call back to verify dosage of metoprolol she is taking.

## 2020-01-03 ENCOUNTER — Ambulatory Visit (INDEPENDENT_AMBULATORY_CARE_PROVIDER_SITE_OTHER): Payer: BC Managed Care – PPO

## 2020-01-03 ENCOUNTER — Encounter (INDEPENDENT_AMBULATORY_CARE_PROVIDER_SITE_OTHER): Payer: Self-pay | Admitting: Vascular Surgery

## 2020-01-03 ENCOUNTER — Other Ambulatory Visit: Payer: Self-pay

## 2020-01-03 ENCOUNTER — Ambulatory Visit (INDEPENDENT_AMBULATORY_CARE_PROVIDER_SITE_OTHER): Payer: BC Managed Care – PPO | Admitting: Vascular Surgery

## 2020-01-03 VITALS — BP 180/110 | HR 66 | Resp 16 | Wt 169.2 lb

## 2020-01-03 DIAGNOSIS — I714 Abdominal aortic aneurysm, without rupture, unspecified: Secondary | ICD-10-CM

## 2020-01-03 DIAGNOSIS — I739 Peripheral vascular disease, unspecified: Secondary | ICD-10-CM

## 2020-01-03 DIAGNOSIS — I70213 Atherosclerosis of native arteries of extremities with intermittent claudication, bilateral legs: Secondary | ICD-10-CM

## 2020-01-03 DIAGNOSIS — I10 Essential (primary) hypertension: Secondary | ICD-10-CM

## 2020-01-03 DIAGNOSIS — E119 Type 2 diabetes mellitus without complications: Secondary | ICD-10-CM

## 2020-01-03 NOTE — Assessment & Plan Note (Signed)
EVAR duplex shows a patent stent graft with no endoleak and an aortic sac size of less than 3 cm in maximal diameter.  Overall doing well.  We will continue to follow this annually.

## 2020-01-03 NOTE — Assessment & Plan Note (Signed)
Her ABIs today are 0.99 on the right and 1.02 on the left with strong biphasic waveforms and normal digital pressures bilaterally.  Her right lower extremity revascularization is doing well and her perfusion is maintained on her left leg.  Continue to check annually.

## 2020-01-03 NOTE — Progress Notes (Signed)
MRN : [id number]  Heather Mahoney is a 54 y.o. (1966/04/08) female who presents with chief complaint of  Chief Complaint  Patient presents with  . Follow-up    ultrasound follow up  .  History of Present Illness: Patient returns today in follow up of her aneurysm and her PAD.  She underwent endovascular abdominal aortic aneurysm repair about a year and a half ago.  She also had a right femoral endarterectomy and embolectomy after the procedure and has generally done well from this.  She reports some pain in her left foot as well as difficulty standing and some swelling in her ankle.  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  Her ABIs today are 0.99 on the right and 1.02 on the left with strong biphasic waveforms and normal digital pressures bilaterally. EVAR duplex shows a patent stent graft with no endoleak and an aortic sac size of less than 3 cm in maximal diameter.  Current Outpatient Medications  Medication Sig Dispense Refill  . aspirin 81 MG EC tablet Take 1 tablet (81 mg total) by mouth daily. 90 tablet 3  . CATAPRES 0.1 MG tablet Take 1 tablet (0.1 mg total) by mouth 2 (two) times daily. 180 tablet 0  . COZAAR 100 MG tablet Take 1 tablet (100 mg total) by mouth daily. 90 tablet 0  . docusate sodium (COLACE) 100 MG capsule Take 1 capsule (100 mg total) by mouth daily. 10 capsule 0  . ezetimibe (ZETIA) 10 MG tablet Take 1 tablet (10 mg total) by mouth daily. 90 tablet 3  . LIPITOR 80 MG tablet Take 1 tablet (80 mg total) by mouth daily. 90 tablet 0  . metFORMIN (GLUCOPHAGE) 500 MG tablet Take 500 mg by mouth 2 (two) times daily with a meal.    . nitroGLYCERIN (NITROSTAT) 0.4 MG SL tablet Place 1 tablet (0.4 mg total) under the tongue every 5 (five) minutes as needed for chest pain. 25 tablet 3  . NORVASC 10 MG tablet Take 1 tablet (10 mg total) by mouth daily. 90 tablet 0  . PLAVIX 75 MG tablet Take 1 tablet (75 mg total) by  mouth daily. 90 tablet 0  . TOPROL XL 50 MG 24 hr tablet Take 1 tablet (50 mg total) by mouth daily. Take with or immediately following a meal. 90 tablet 0   No current facility-administered medications for this visit.    Past Medical History:  Diagnosis Date  . CAD (coronary artery disease)    a. PCI to proximal and distal RCA 3/26  . GERD (gastroesophageal reflux disease)   . Heart murmur    MR  . Hiatal hernia   . Hyperlipidemia   . Hypertension   . Tobacco abuse    a. Started at age 51, quit during 3 pregnancies. b. 1 PPD for a 40 pack-year hx (2019)     Past Surgical History:  Procedure Laterality Date  . BREAST BIOPSY Right    Multiple biopsies-all benign  . BREAST SURGERY  1986   I&D for 'milk duct'  . CORONARY STENT INTERVENTION N/A 01/13/2019   Procedure: CORONARY STENT INTERVENTION;  Surgeon: Nelva Bush, MD;  Location: [place] [facility];  Service: Cardiovascular;  Laterality: N/A;  . EMBOLECTOMY  08/13/2018   Procedure: Right SFA and profunda femoris Fogarty embolectomy 3  Fogarty embolectomy balloon;  Surgeon: Algernon Huxley, MD;  Location: [facility];  Service: Vascular;;  .  ENDARTERECTOMY FEMORAL Right 08/13/2018   Procedure: Right common femoral, profunda femoris, and superficial femoral artery endarterectomies and patch angioplasty ;  Surgeon: Algernon Huxley, MD;  Location: [facility];  Service: Vascular;  Laterality: Right;  . ENDOVASCULAR REPAIR/STENT GRAFT N/A 08/13/2018   Procedure: ENDOVASCULAR REPAIR/STENT GRAFT;  Surgeon: Katha Cabal, MD;  Location: [place] [facility];  Service: Cardiovascular;  Laterality: N/A;  . LEFT HEART CATH AND CORONARY ANGIOGRAPHY Left 01/13/2019   Procedure: LEFT HEART CATH AND CORONARY ANGIOGRAPHY;  Surgeon: Minna Merritts, MD;  Location: [facility];  Service: Cardiovascular;  Laterality: Left;  . SALPINGOOPHORECTOMY Left 2000     Social History   Tobacco Use  . Smoking status: Former Smoker     Packs/day: 0.25    Years: 38.00    Pack years: 9.50    Types: Cigarettes    Quit date: 08/10/2018    Years since quitting: 1.4  . Smokeless tobacco: Never Used  Substance Use Topics  . Alcohol use: Yes    Comment: occassional  . Drug use: Yes    Types: Marijuana    Comment: smokes Marijuana 'when I cant sleep, maybe once or twice a week'    Family History  Problem Relation Age of Onset  . Diabetes Mother   . Hypertension Mother   . Hyperlipidemia Mother   . Heart disease Mother        CABG X 4  . Asthma Mother   . Sarcoidosis Mother        In remission  . Heart attack Mother   . Diabetes Maternal Grandmother   . Heart disease Maternal Grandmother   . Hyperlipidemia Maternal Grandmother      Allergies  Allergen Reactions  . Strawberry Extract Hives and Swelling  . Strawberry Flavor Rash    REVIEW OF SYSTEMS (Negative unless checked)  Constitutional: [] ?Weight loss  [] ?Fever  [] ?Chills Cardiac: [] ?Chest pain   [] ?Chest pressure   [] ?Palpitations   [] ?Shortness of breath when laying flat   [] ?Shortness of breath at rest   [] ?Shortness of breath with exertion. Vascular:  [x] ?Pain in legs with walking   [] ?Pain in legs at rest   [] ?Pain in legs when laying flat   [x] ?Claudication   [] ?Pain in feet when walking  [] ?Pain in feet at rest  [] ?Pain in feet when laying flat   [] ?History of DVT   [] ?Phlebitis   [] ?Swelling in legs   [] ?Varicose veins   [] ?Non-healing ulcers Pulmonary:   [] ?Uses home oxygen   [] ?Productive cough   [] ?Hemoptysis   [] ?Wheeze  [] ?COPD   [] ?Asthma Neurologic:  [] ?Dizziness  [] ?Blackouts   [] ?Seizures   [] ?History of stroke   [] ?History of TIA  [] ?Aphasia   [] ?Temporary blindness   [] ?Dysphagia   [] ?Weakness or numbness in arms   [] ?Weakness or numbness in legs Musculoskeletal:  [x] ?Arthritis   [] ?Joint swelling   [] ?Joint pain   [x] ?Low back pain Hematologic:  [] ?Easy bruising  [] ?Easy bleeding   [] ?Hypercoagulable state   [] ?Anemic   Gastrointestinal:   [] ?Blood in stool   [] ?Vomiting blood  [] ?Gastroesophageal reflux/heartburn   [] ?Abdominal pain Genitourinary:  [] ?Chronic kidney disease   [] ?Difficult urination  [] ?Frequent urination  [] ?Burning with urination   [] ?Hematuria Skin:  [] ?Rashes   [] ?Ulcers   [] ?Wounds Psychological:  [] ?History of anxiety   [] ? History of major depression.  Physical Examination  BP (!) 180/110 (BP Location: Right Arm)   Pulse 66   Resp 16   Wt 169  lb 3.2 oz (76.7 kg)   BMI 29.04 kg/m  Gen:  WD/WN, NAD Head: [place]/AT, No temporalis wasting. Ear/Nose/Throat: Hearing grossly intact, nares w/o erythema or drainage Eyes: Conjunctiva clear. Sclera non-icteric Neck: Supple.  Trachea midline Pulmonary:  Good air movement, no use of accessory muscles.  Cardiac: RRR, no JVD Vascular:  Vessel Right Left  Radial Palpable Palpable                          PT Palpable Palpable  DP Palpable Palpable   Gastrointestinal: soft, non-tender/non-distended. No guarding/reflex.  Musculoskeletal: M/S 5/5 throughout.  No deformity or atrophy. Trace LLE edema. Neurologic: Sensation grossly intact in extremities.  Symmetrical.  Speech is fluent.  Psychiatric: Judgment intact, Mood & affect appropriate for pt's clinical situation. Dermatologic: No rashes or ulcers noted.  No cellulitis or open wounds.       Labs No results found for this or any previous visit (from the past 2160 hour(s)).  Radiology No results found.  Assessment/Plan Hypertension blood pressure control important in reducing the progression of atherosclerotic disease. On appropriate oral medications.   Diabetes ([place]) blood glucose control important in reducing the progression of atherosclerotic disease. Also, involved in wound healing. On appropriate medications.  PAD (peripheral artery disease) (HCC) Her ABIs today are 0.99 on the right and 1.02 on the left with strong biphasic waveforms and normal digital pressures bilaterally.  Her  right lower extremity revascularization is doing well and her perfusion is maintained on her left leg.  Continue to check annually.  AAA (abdominal aortic aneurysm) (HCC) EVAR duplex shows a patent stent graft with no endoleak and an aortic sac size of less than 3 cm in maximal diameter.  Overall doing well.  We will continue to follow this annually.    [name], MD  01/03/2020 10:46 AM    This note was created with Dragon medical transcription system.  Any errors from dictation are purely unintentional

## 2020-01-16 ENCOUNTER — Ambulatory Visit: Payer: Medicaid Other | Attending: Internal Medicine

## 2020-01-16 DIAGNOSIS — Z23 Encounter for immunization: Secondary | ICD-10-CM

## 2020-01-16 NOTE — Progress Notes (Signed)
   Covid-19 Vaccination Clinic  Name:  Heather Mahoney    MRN: [id number] DOB: 1966/01/20  01/16/2020  Heather Mahoney was observed post Covid-19 immunization for 15 minutes without incident. She was provided with Vaccine Information Sheet and instruction to access the V-Safe system.   Heather Mahoney was instructed to call 911 with any severe reactions post vaccine: Marland Kitchen Difficulty breathing  . Swelling of face and throat  . A fast heartbeat  . A bad rash all over body  . Dizziness and weakness   Immunizations Administered    Name Date Dose VIS Date Route   Pfizer COVID-19 Vaccine 01/16/2020 11:47 AM 0.3 mL 09/30/2019 Intramuscular   Manufacturer: [place]   Lot: [id number]   [place]: [id number]

## 2020-02-08 ENCOUNTER — Ambulatory Visit: Payer: Self-pay | Attending: Internal Medicine

## 2020-02-08 DIAGNOSIS — Z23 Encounter for immunization: Secondary | ICD-10-CM

## 2020-02-08 NOTE — Progress Notes (Signed)
   Covid-19 Vaccination Clinic  Name:  Heather Mahoney    MRN: [id number] DOB: 22-Feb-1966  02/08/2020  Heather Mahoney was observed post Covid-19 immunization for 15 minutes without incident. She was provided with Vaccine Information Sheet and instruction to access the V-Safe system.   Heather Mahoney was instructed to call 911 with any severe reactions post vaccine: Marland Kitchen Difficulty breathing  . Swelling of face and throat  . A fast heartbeat  . A bad rash all over body  . Dizziness and weakness   Immunizations Administered    Name Date Dose VIS Date Route   Pfizer COVID-19 Vaccine 02/08/2020  3:46 PM 0.3 mL 12/14/2018 Intramuscular   Manufacturer: [place]   Lot: [id number]   [place]: [id number]

## 2020-03-27 NOTE — Progress Notes (Signed)
Date:  03/28/2020   ID:  Heather Mahoney, [name] May 14, 1966, MRN [id number]  Patient Location:  [address] [place] [address]   Provider location:   [facility], [place] office  PCP:  Alene Mires Elyse Jarvis, MD  Cardiologist:  Arvid Right [facility]  Chief Complaint  Patient presents with  . \Other    6 monht follow up. Meds reviewed verbally with patient.    History of Present Illness:    Heather Mahoney is a 54 y.o. female  past medical history of Smoker GERD HTN PVCs Fall and question of loss of consciousness 2017, secondary to orthostasis,  AAA with endovascular repair October 2019, access site on the right groin CAD, stent x2 to RCA March 2020, Access site left groin Who presents today for follow-up of her chest pain/angina, shortness of breath, CAD, results of her recent cardiac CT  Previously worked at [employer] Now Works at [employer], watches monitors  About to get apartment Overall feels she is doing well  Had vaccine Traveling, goes to see family in [place] Not very active secondary chronic leg pain Etiology of leg pain unclear  Out of zetia Has been taking her Lipitor  No chest pain symptoms concerning for angina  EKG personally reviewed by myself on todays visit Shows normal sinus rhythm rate 95 bpm rare PVC no significant ST-T wave changes  Lab work reviewed, cholesterol numbers above goal  Other past medical history reviewed cardiac CTA showing stenosis in RCA by FFR   cardiac catheterization January 13, 2019  Cardiac cath 01/13/2019 Single-vessel disease notably of the RCA moderate to severe proximal  disease estimated 75% and distal RCA disease estimated at 80% There is disease of high OM vessel, small in caliber diffusely diseased Mild proximal LAD disease  --- stenting of the proximal RCA and distal RCA  subsequent left groin hematoma causing hypotension, vagal response Was kept for 2 days post procedure in the  hospital Her   Medication intolerances  isosorbide as this caused a headache   peripheral vascular catheterization in October 2019  AAA repair, endograft  She reports strong family history of coronary disease Mother with bypass surgery Her grandmother, mother side, passed away at the age of 41 from a brain aneurysm.    Prior CV studies:   The following studies were reviewed today:  Cardiac CTA cardiac CT scan was ordered showing severe stenosis mid RCA (FFR less than 0.7), indeterminate stenosis LAD and circumflex (FFR less than 0.8)   Past Medical History:  Diagnosis Date  . CAD (coronary artery disease)    a. PCI to proximal and distal RCA 3/26  . GERD (gastroesophageal reflux disease)   . Heart murmur    MR  . Hiatal hernia   . Hyperlipidemia   . Hypertension   . Tobacco abuse    a. Started at age 30, quit during 3 pregnancies. b. 1 PPD for a 40 pack-year hx (2019)    Past Surgical History:  Procedure Laterality Date  . BREAST BIOPSY Right    Multiple biopsies-all benign  . BREAST SURGERY  1986   I&D for 'milk duct'  . CORONARY STENT INTERVENTION N/A 01/13/2019   Procedure: CORONARY STENT INTERVENTION;  Surgeon: Nelva Bush, MD;  Location: [place] [facility];  Service: Cardiovascular;  Laterality: N/A;  . EMBOLECTOMY  08/13/2018   Procedure: Right SFA and profunda femoris Fogarty embolectomy 3  Fogarty embolectomy balloon;  Surgeon: Algernon Huxley,  MD;  Location: [facility];  Service: Vascular;;  . ENDARTERECTOMY FEMORAL Right 08/13/2018   Procedure: Right common femoral, profunda femoris, and superficial femoral artery endarterectomies and patch angioplasty ;  Surgeon: Algernon Huxley, MD;  Location: [facility];  Service: Vascular;  Laterality: Right;  . ENDOVASCULAR REPAIR/STENT GRAFT N/A 08/13/2018   Procedure: ENDOVASCULAR REPAIR/STENT GRAFT;  Surgeon: Katha Cabal, MD;  Location: [place] [facility];  Service: Cardiovascular;  Laterality: N/A;  . LEFT  HEART CATH AND CORONARY ANGIOGRAPHY Left 01/13/2019   Procedure: LEFT HEART CATH AND CORONARY ANGIOGRAPHY;  Surgeon: Minna Merritts, MD;  Location: [place] [facility];  Service: Cardiovascular;  Laterality: Left;  . SALPINGOOPHORECTOMY Left 2000     Current Meds  Medication Sig  . aspirin 81 MG EC tablet Take 1 tablet (81 mg total) by mouth daily.  Marland Kitchen CATAPRES 0.1 MG tablet Take 1 tablet (0.1 mg total) by mouth 2 (two) times daily.  . cholecalciferol (VITAMIN D3) 25 MCG (1000 UNIT) tablet Take 1,000 Units by mouth 2 (two) times daily.  Marland Kitchen COZAAR 100 MG tablet Take 1 tablet (100 mg total) by mouth daily.  Marland Kitchen docusate sodium (COLACE) 100 MG capsule Take 1 capsule (100 mg total) by mouth daily.  Marland Kitchen ezetimibe (ZETIA) 10 MG tablet Take 1 tablet (10 mg total) by mouth daily.  Marland Kitchen LIPITOR 80 MG tablet Take 1 tablet (80 mg total) by mouth daily.  . metFORMIN (GLUCOPHAGE) 500 MG tablet Take 500 mg by mouth 2 (two) times daily with a meal.  . nitroGLYCERIN (NITROSTAT) 0.4 MG SL tablet Place 1 tablet (0.4 mg total) under the tongue every 5 (five) minutes as needed for chest pain.  . NORVASC 10 MG tablet Take 1 tablet (10 mg total) by mouth daily.  Marland Kitchen PLAVIX 75 MG tablet Take 1 tablet (75 mg total) by mouth daily.  . TOPROL XL 50 MG 24 hr tablet Take 1 tablet (50 mg total) by mouth daily. Take with or immediately following a meal.     Allergies:   Strawberry extract and Strawberry flavor   Social History   Tobacco Use  . Smoking status: Former Smoker    Packs/day: 0.25    Years: 38.00    Pack years: 9.50    Types: Cigarettes    Quit date: 08/10/2018    Years since quitting: 1.6  . Smokeless tobacco: Never Used  Substance Use Topics  . Alcohol use: Yes    Comment: occassional  . Drug use: Yes    Types: Marijuana    Comment: smokes Marijuana 'when I cant sleep, maybe once or twice a week'     Current Outpatient Medications on File Prior to Visit  Medication Sig Dispense Refill  . aspirin  81 MG EC tablet Take 1 tablet (81 mg total) by mouth daily. 90 tablet 3  . CATAPRES 0.1 MG tablet Take 1 tablet (0.1 mg total) by mouth 2 (two) times daily. 180 tablet 0  . cholecalciferol (VITAMIN D3) 25 MCG (1000 UNIT) tablet Take 1,000 Units by mouth 2 (two) times daily.    Marland Kitchen COZAAR 100 MG tablet Take 1 tablet (100 mg total) by mouth daily. 90 tablet 0  . docusate sodium (COLACE) 100 MG capsule Take 1 capsule (100 mg total) by mouth daily. 10 capsule 0  . ezetimibe (ZETIA) 10 MG tablet Take 1 tablet (10 mg total) by mouth daily. 90 tablet 3  . LIPITOR 80 MG tablet Take 1 tablet (80 mg total)  by mouth daily. 90 tablet 0  . metFORMIN (GLUCOPHAGE) 500 MG tablet Take 500 mg by mouth 2 (two) times daily with a meal.    . nitroGLYCERIN (NITROSTAT) 0.4 MG SL tablet Place 1 tablet (0.4 mg total) under the tongue every 5 (five) minutes as needed for chest pain. 25 tablet 3  . NORVASC 10 MG tablet Take 1 tablet (10 mg total) by mouth daily. 90 tablet 0  . PLAVIX 75 MG tablet Take 1 tablet (75 mg total) by mouth daily. 90 tablet 0  . TOPROL XL 50 MG 24 hr tablet Take 1 tablet (50 mg total) by mouth daily. Take with or immediately following a meal. 90 tablet 0   No current facility-administered medications on file prior to visit.     Family Hx: The patient's family history includes Asthma in her mother; Diabetes in her maternal grandmother and mother; Heart attack in her mother; Heart disease in her maternal grandmother and mother; Hyperlipidemia in her maternal grandmother and mother; Hypertension in her mother; Sarcoidosis in her mother.  ROS:   Please see the history of present illness.    Review of Systems  Constitutional: Negative.   HENT: Negative.   Respiratory: Negative.   Cardiovascular: Negative.   Gastrointestinal: Negative.   Musculoskeletal: Negative.   Neurological: Negative.   Psychiatric/Behavioral: Negative.   All other systems reviewed and are negative.    Labs/Other Tests  and Data Reviewed:    Recent Labs: 04/09/2019: Hemoglobin 11.6; Platelets 488 04/10/2019: TSH 4.946 04/11/2019: BUN 15; Creatinine, Ser 0.65; Potassium 3.4; Sodium 139   Recent Lipid Panel Lab Results  Component Value Date/Time   CHOL 200 04/10/2019 05:01 AM   CHOL 266 (H) 12/06/2018 10:17 AM   TRIG 82 04/10/2019 05:01 AM   HDL 55 04/10/2019 05:01 AM   HDL 64 12/06/2018 10:17 AM   CHOLHDL 3.6 04/10/2019 05:01 AM   LDLCALC 129 (H) 04/10/2019 05:01 AM   LDLCALC 178 (H) 12/06/2018 10:17 AM   LDLDIRECT 185 (H) 12/06/2018 10:17 AM    Wt Readings from Last 3 Encounters:  03/28/20 174 lb (78.9 kg)  01/03/20 169 lb 3.2 oz (76.7 kg)  09/26/19 172 lb 4 oz (78.1 kg)     Exam:   BP 134/78 (BP Location: Left Arm, Patient Position: Sitting, Cuff Size: Normal)   Pulse 95   Ht 5\' 6"  (1.676 m)   Wt 174 lb (78.9 kg)   SpO2 98%   BMI 28.08 kg/m   Constitutional:  oriented to person, place, and time. No distress.  HENT:  Head: Grossly normal Eyes:  no discharge. No scleral icterus.  Neck: No JVD, no carotid bruits  Cardiovascular: Regular rate and rhythm, no murmurs appreciated Pulmonary/Chest: Clear to auscultation bilaterally, no wheezes or rails Abdominal: Soft.  no distension.  no tenderness.  Musculoskeletal: Normal range of motion Neurological:  normal muscle tone. Coordination normal. 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 No further workup at this time. Continue current medication regimen. Recommend she restart her Zetia Trial hold of the Lipitor given her leg pain  PAD (peripheral artery disease) ([place]) AAA with endograft placement , recovered well Followed by Dr. Lucky Cowboy  Abdominal aortic aneurysm (AAA) without rupture ([place]) Monitored by Dr. Lucky Cowboy  Essential  hypertension, benign Blood pressure  is well controlled on today's visit. No changes made to the medications.  Mixed hyperlipidemia Restart zetia Trial hold of the lipitor for leg pain, might need crestor  Tobacco use disorder Smoking cessation recommended   Disposition: Follow-up in 6 months   Total encounter time more than 25 minutes  Greater than 50% was spent in counseling and coordination of care with the patient    Signed, Ida Rogue, MD  03/28/2020 3:14 PM    [place] Office [address], [place], [place] [address]

## 2020-03-28 ENCOUNTER — Encounter: Payer: Self-pay | Admitting: Cardiovascular Disease

## 2020-03-28 ENCOUNTER — Other Ambulatory Visit: Payer: Self-pay

## 2020-03-28 ENCOUNTER — Ambulatory Visit (INDEPENDENT_AMBULATORY_CARE_PROVIDER_SITE_OTHER): Payer: Self-pay | Admitting: Cardiovascular Disease

## 2020-03-28 VITALS — BP 134/78 | HR 95 | Ht 66.0 in | Wt 174.0 lb

## 2020-03-28 DIAGNOSIS — I10 Essential (primary) hypertension: Secondary | ICD-10-CM

## 2020-03-28 DIAGNOSIS — D649 Anemia, unspecified: Secondary | ICD-10-CM

## 2020-03-28 DIAGNOSIS — I714 Abdominal aortic aneurysm, without rupture, unspecified: Secondary | ICD-10-CM

## 2020-03-28 DIAGNOSIS — F17200 Nicotine dependence, unspecified, uncomplicated: Secondary | ICD-10-CM

## 2020-03-28 DIAGNOSIS — E785 Hyperlipidemia, unspecified: Secondary | ICD-10-CM

## 2020-03-28 DIAGNOSIS — I25118 Atherosclerotic heart disease of native coronary artery with other forms of angina pectoris: Secondary | ICD-10-CM

## 2020-03-28 DIAGNOSIS — I739 Peripheral vascular disease, unspecified: Secondary | ICD-10-CM

## 2020-03-28 MED ORDER — EZETIMIBE 10 MG PO TABS: 10.0000 mg | ORAL_TABLET | Freq: Every day | ORAL | 4 refills | Status: DC

## 2020-03-28 NOTE — Patient Instructions (Addendum)
We will request labs from [facility]   Medication Instructions:  No changes  Ok to do a trial hold on the lipitor  If leg pain better, we will change to crestor  Please restart zetia daily  If you need a refill on your cardiac medications before your next appointment, please call your pharmacy.    Lab work: No new labs needed   If you have labs (blood work) drawn today and your tests are completely normal, you will receive your results only by: Marland Kitchen MyChart Message (if you have MyChart) OR . A paper copy in the mail If you have any lab test that is abnormal or we need to change your treatment, we will call you to review the results.   Testing/Procedures: No new testing needed   Follow-Up: At [facility], you and your health needs are our priority.  As part of our continuing mission to provide you with exceptional heart care, we have created designated Provider Care Teams.  These Care Teams include your primary Cardiologist (physician) and Advanced Practice Providers (APPs -  Physician Assistants and Nurse Practitioners) who all work together to provide you with the care you need, when you need it.  . You will need a follow up appointment in 12 months   . Providers on your designated Care Team:   . Murray Hodgkins, NP . Christell Faith, PA-C . Marrianne Mood, PA-C  Any Other Special Instructions Will Be Listed Below (If Applicable).  For educational health videos Log in to : [email or web address] Or : [email or web address], password : [email or web address]

## 2020-03-29 ENCOUNTER — Encounter: Payer: Self-pay | Admitting: Cardiovascular Disease

## 2020-03-29 NOTE — Addendum Note (Signed)
Addended by: Janan Ridge on: 03/29/2020 03:21 PM   Modules accepted: Orders

## 2020-04-06 ENCOUNTER — Ambulatory Visit (INDEPENDENT_AMBULATORY_CARE_PROVIDER_SITE_OTHER): Payer: Self-pay | Admitting: Vascular Surgery

## 2020-04-06 ENCOUNTER — Other Ambulatory Visit (INDEPENDENT_AMBULATORY_CARE_PROVIDER_SITE_OTHER): Payer: Self-pay

## 2020-04-25 ENCOUNTER — Encounter: Payer: Self-pay | Admitting: Emergency Medicine

## 2020-04-25 ENCOUNTER — Emergency Department: Payer: Self-pay

## 2020-04-25 ENCOUNTER — Other Ambulatory Visit: Payer: Self-pay

## 2020-04-25 ENCOUNTER — Emergency Department
Admission: EM | Admit: 2020-04-25 | Discharge: 2020-04-25 | Disposition: A | Discharge status: Home / Self Care | Payer: Self-pay | Attending: Emergency Medicine | Admitting: Emergency Medicine

## 2020-04-25 DIAGNOSIS — I25118 Atherosclerotic heart disease of native coronary artery with other forms of angina pectoris: Secondary | ICD-10-CM | POA: Insufficient documentation

## 2020-04-25 DIAGNOSIS — I10 Essential (primary) hypertension: Secondary | ICD-10-CM | POA: Insufficient documentation

## 2020-04-25 DIAGNOSIS — Z79899 Other long term (current) drug therapy: Secondary | ICD-10-CM | POA: Insufficient documentation

## 2020-04-25 DIAGNOSIS — K2100 Gastro-esophageal reflux disease with esophagitis, without bleeding: Secondary | ICD-10-CM | POA: Insufficient documentation

## 2020-04-25 DIAGNOSIS — Z9861 Coronary angioplasty status: Secondary | ICD-10-CM | POA: Insufficient documentation

## 2020-04-25 DIAGNOSIS — Z7984 Long term (current) use of oral hypoglycemic drugs: Secondary | ICD-10-CM | POA: Insufficient documentation

## 2020-04-25 DIAGNOSIS — Z7982 Long term (current) use of aspirin: Secondary | ICD-10-CM | POA: Insufficient documentation

## 2020-04-25 DIAGNOSIS — R0789 Other chest pain: Secondary | ICD-10-CM | POA: Insufficient documentation

## 2020-04-25 DIAGNOSIS — Z87891 Personal history of nicotine dependence: Secondary | ICD-10-CM | POA: Insufficient documentation

## 2020-04-25 DIAGNOSIS — E119 Type 2 diabetes mellitus without complications: Secondary | ICD-10-CM | POA: Insufficient documentation

## 2020-04-25 LAB — CBC
HCT: 35.7 % — ABNORMAL LOW (ref 36.0–46.0)
Hemoglobin: 11.9 g/dL — ABNORMAL LOW (ref 12.0–15.0)
MCH: 27.2 pg (ref 26.0–34.0)
MCHC: 33.3 g/dL (ref 30.0–36.0)
MCV: 81.7 fL (ref 80.0–100.0)
Platelets: 421 10*3/uL — ABNORMAL HIGH (ref 150–400)
RBC: 4.37 MIL/uL (ref 3.87–5.11)
RDW: 16.3 % — ABNORMAL HIGH (ref 11.5–15.5)
WBC: 12.8 10*3/uL — ABNORMAL HIGH (ref 4.0–10.5)
nRBC: 0 % (ref 0.0–0.2)

## 2020-04-25 LAB — BASIC METABOLIC PANEL
Anion gap: 9 (ref 5–15)
BUN: 16 mg/dL (ref 6–20)
CO2: 24 mmol/L (ref 22–32)
Calcium: 9.1 mg/dL (ref 8.9–10.3)
Chloride: 104 mmol/L (ref 98–111)
Creatinine, Ser: 0.93 mg/dL (ref 0.44–1.00)
GFR calc Af Amer: >60 mL/min (ref >60)
GFR calc non Af Amer: >60 mL/min (ref >60)
Glucose, Bld: 120 mg/dL — ABNORMAL HIGH (ref 70–99)
Potassium: 3.9 mmol/L (ref 3.5–5.1)
Sodium: 137 mmol/L (ref 135–145)

## 2020-04-25 LAB — TROPONIN I (HIGH SENSITIVITY): Troponin I (High Sensitivity): 5 ng/L (ref <18)

## 2020-04-25 LAB — POCT PREGNANCY, URINE: Preg Test, Ur: NEGATIVE

## 2020-04-25 MED ORDER — ALUM & MAG HYDROXIDE-SIMETH 200-200-20 MG/5ML PO SUSP
30.0000 mL | Freq: Once | ORAL | Status: AC
  Administered 2020-04-25: 30 mL via ORAL
  Filled 2020-04-25: qty 30

## 2020-04-25 MED ORDER — LIDOCAINE VISCOUS HCL 2 % MT SOLN
15.0000 mL | Freq: Once | OROMUCOSAL | Status: AC
  Administered 2020-04-25: 15 mL via ORAL
  Filled 2020-04-25: qty 15

## 2020-04-25 MED ORDER — SUCRALFATE 1 G PO TABS
1.0000 g | ORAL_TABLET | Freq: Three times a day (TID) | ORAL | 1 refills | Status: DC
Start: 2020-04-25 — End: 2022-11-03

## 2020-04-25 MED ORDER — SODIUM CHLORIDE 0.9% FLUSH: 3.0000 mL | Freq: Once | INTRAVENOUS | Status: DC

## 2020-04-25 NOTE — ED Provider Notes (Signed)
[facility] Emergency Department Provider Note   ____________________________________________    I have reviewed the triage vital signs and the nursing notes.   HISTORY  Chief Complaint Hives and indigestion    HPI Heather Mahoney is a 54 y.o. female reports that she woke up this morning with itching to her arms bilaterally and possible hives.  She reports that is not common for her, she did take Benadryl with relief of symptoms.  She also notes that she has had indigestion today and some mild discomfort in her epigastrium.  No fevers or chills or cough.  No shortness of breath.  No chest pain in the emergency department.  Does have a history of coronary artery disease.  No diaphoresis nausea or vomiting   Past Medical History:  Diagnosis Date  . CAD (coronary artery disease)    a. PCI to proximal and distal RCA 3/26  . GERD (gastroesophageal reflux disease)   . Heart murmur    MR  . Hiatal hernia   . Hyperlipidemia   . Hypertension   . Tobacco abuse    a. Started at age 50, quit during 3 pregnancies. b. 1 PPD for a 40 pack-year hx (2019)     Patient Active Problem List   Diagnosis Date Noted  . Diabetes ([place]) 07/05/2019  . Precordial chest pain   . Chest pain 04/09/2019  . Atherosclerosis of native arteries of extremity with intermittent claudication ([place]) 04/01/2019  . Coronary artery disease of native artery of native heart with stable angina pectoris ([place]) 01/26/2019  . CAD (coronary artery disease) 01/14/2019  . Unstable angina ([place]) 01/13/2019  . PAD (peripheral artery disease) ([place]) 08/30/2018  . AAA (abdominal aortic aneurysm) ([place]) 08/10/2018  . Chest pain with moderate risk for cardiac etiology 12/18/2017  . 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 PVC (premature ventricular contraction) 02/12/2016  . Essential hypertension, benign 02/12/2016  . Tobacco use disorder 02/12/2016  . Migraine headache 01/09/2016  . Lymphocytosis 12/21/2015  .  Hypertension 12/12/2015  . Mixed hyperlipidemia 12/12/2015  . Vitamin D deficiency 12/12/2015  . Mitral regurgitation 12/12/2015  . Hypertensive urgency 11/29/2015  . Abdominal pulsatile mass 11/29/2015    Past Surgical History:  Procedure Laterality Date  . BREAST BIOPSY Right    Multiple biopsies-all benign  . BREAST SURGERY  1986   I&D for 'milk duct'  . CORONARY STENT INTERVENTION N/A 01/13/2019   Procedure: CORONARY STENT INTERVENTION;  Surgeon: Nelva Bush, MD;  Location: [place] [facility];  Service: Cardiovascular;  Laterality: N/A;  . EMBOLECTOMY  08/13/2018   Procedure: Right SFA and profunda femoris Fogarty embolectomy 3  Fogarty embolectomy balloon;  Surgeon: Algernon Huxley, MD;  Location: [facility];  Service: Vascular;;  . ENDARTERECTOMY FEMORAL Right 08/13/2018   Procedure: Right common femoral, profunda femoris, and superficial femoral artery endarterectomies and patch angioplasty ;  Surgeon: Algernon Huxley, MD;  Location: [facility];  Service: Vascular;  Laterality: Right;  . ENDOVASCULAR REPAIR/STENT GRAFT N/A 08/13/2018   Procedure: ENDOVASCULAR REPAIR/STENT GRAFT;  Surgeon: Katha Cabal, MD;  Location: [place] [facility];  Service: Cardiovascular;  Laterality: N/A;  . LEFT HEART CATH AND CORONARY ANGIOGRAPHY Left 01/13/2019   Procedure: LEFT HEART CATH AND CORONARY ANGIOGRAPHY;  Surgeon: Minna Merritts, MD;  Location: [place] [facility];  Service: Cardiovascular;  Laterality: Left;  . SALPINGOOPHORECTOMY Left 2000    Prior to Admission medications   Medication Sig Start Date End Date Taking? Authorizing Provider  aspirin 81 MG EC tablet Take 1 tablet (81 mg total) by mouth daily. 01/14/19   Marrianne Mood D, PA-C  CATAPRES 0.1 MG tablet Take 1 tablet (0.1 mg total) by mouth 2 (two) times daily. 10/26/19   Minna Merritts, MD  cholecalciferol (VITAMIN D3) 25 MCG (1000 UNIT) tablet Take 1,000 Units by mouth 2 (two) times daily.    [provider]    COZAAR 100 MG tablet Take 1 tablet (100 mg total) by mouth daily. 10/26/19   Minna Merritts, MD  docusate sodium (COLACE) 100 MG capsule Take 1 capsule (100 mg total) by mouth daily. 08/16/18   Vaughan Basta, MD  ezetimibe (ZETIA) 10 MG tablet Take 1 tablet (10 mg total) by mouth daily. 03/28/20   Minna Merritts, MD  LIPITOR 80 MG tablet Take 1 tablet (80 mg total) by mouth daily. 10/26/19   Minna Merritts, MD  metFORMIN (GLUCOPHAGE) 500 MG tablet Take 500 mg by mouth 2 (two) times daily with a meal.    [provider]  nitroGLYCERIN (NITROSTAT) 0.4 MG SL tablet Place 1 tablet (0.4 mg total) under the tongue every 5 (five) minutes as needed for chest pain. 01/26/19 03/28/20  Minna Merritts, MD  NORVASC 10 MG tablet Take 1 tablet (10 mg total) by mouth daily. 10/26/19   Minna Merritts, MD  PLAVIX 75 MG tablet Take 1 tablet (75 mg total) by mouth daily. 10/26/19   Minna Merritts, MD  sucralfate (CARAFATE) 1 g tablet Take 1 tablet (1 g total) by mouth 4 (four) times daily -  with meals and at bedtime. 04/25/20   Lavonia Drafts, MD  TOPROL XL 50 MG 24 hr tablet Take 1 tablet (50 mg total) by mouth daily. Take with or immediately following a meal. 10/26/19   Gollan, Kathlene November, MD     Allergies Strawberry extract and Strawberry flavor  Family History  Problem Relation Age of Onset  . Diabetes Mother   . Hypertension Mother   . Hyperlipidemia Mother   . Heart disease Mother        CABG X 4  . Asthma Mother   . Sarcoidosis Mother        In remission  . Heart attack Mother   . Diabetes Maternal Grandmother   . Heart disease Maternal Grandmother   . Hyperlipidemia Maternal Grandmother     Social History Social History   Tobacco Use  . Smoking status: Former Smoker    Packs/day: 0.25    Years: 38.00    Pack years: 9.50    Types: Cigarettes    Quit date: 08/10/2018    Years since quitting: 1.7  . Smokeless tobacco: Never Used  Vaping Use  . Vaping Use: Never used   Substance Use Topics  . Alcohol use: Yes    Comment: occassional  . Drug use: Yes    Types: Marijuana    Comment: smokes Marijuana 'when I cant sleep, maybe once or twice a week'    Review of Systems  Constitutional: No fever/chills Eyes: No visual changes.  ENT: No sore throat. Cardiovascular: As above Respiratory: Denies shortness of breath. Gastrointestinal: As above Genitourinary: Negative for dysuria. Musculoskeletal: Negative for back pain. Skin: Negative for rash. Neurological: Negative for headaches or weakness   ____________________________________________   PHYSICAL EXAM:  VITAL SIGNS: ED Triage Vitals  Enc Vitals Group     BP 04/25/20 0842 (!) 154/100     Pulse Rate 04/25/20 0842 82  Resp 04/25/20 0842 16     Temp 04/25/20 0842 99.2 F (37.3 C)     Temp Source 04/25/20 0842 Oral     SpO2 04/25/20 0842 97 %     Weight 04/25/20 0840 78.9 kg (173 lb 15.1 oz)     Height 04/25/20 0840 1.676 m (5\' 6" )     Head Circumference --      Peak Flow --      Pain Score 04/25/20 0839 5     Pain Loc --      Pain Edu? --      Excl. in [place]? --     Constitutional: Alert and oriented. No acute distress. Pleasant and interactive Eyes: Conjunctivae are normal.  Head: Atraumatic.  Cardiovascular: Normal rate, regular rhythm. Grossly normal heart sounds.  Good peripheral circulation. Respiratory: Normal respiratory effort.  No retractions. Lungs CTAB. Gastrointestinal: Soft and nontender. No distention.  No CVA tenderness.  Musculoskeletal: No lower extremity tenderness nor edema.  Warm and well perfused Neurologic:  Normal speech and language. No gross focal neurologic deficits are appreciated.  Skin:  Skin is warm, dry and intact. No rash noted. Psychiatric: Mood and affect are normal. Speech and behavior are normal.  ____________________________________________   LABS (all labs ordered are listed, but only abnormal results are displayed)  Labs Reviewed   BASIC METABOLIC PANEL - Abnormal; Notable for the following components:      Result Value   Glucose, Bld 120 (*)    All other components within normal limits  CBC - Abnormal; Notable for the following components:   WBC 12.8 (*)    Hemoglobin 11.9 (*)    HCT 35.7 (*)    RDW 16.3 (*)    Platelets 421 (*)    All other components within normal limits  POC URINE PREG, ED  POCT PREGNANCY, URINE  TROPONIN I (HIGH SENSITIVITY)   ____________________________________________  EKG  ED ECG REPORT I, Lavonia Drafts, the attending physician, personally viewed and interpreted this ECG.  Date: 04/25/2020  Rhythm: normal sinus rhythm QRS Axis: normal Intervals: normal ST/T Wave abnormalities: normal Narrative Interpretation: no evidence of acute ischemia  ____________________________________________  RADIOLOGY  Chest x-ray reviewed by me, unremarkable, no acute distress ____________________________________________   PROCEDURES  Procedure(s) performed: No  Procedures   Critical Care performed: No ____________________________________________   INITIAL IMPRESSION / ASSESSMENT AND PLAN / ED COURSE  Pertinent labs & imaging results that were available during my care of the patient were reviewed by me and considered in my medical decision making (see chart for details).  Patient presents with primary complaint of indigestion, differential includes gastritis/esophagitis.  She does describe it as a burning sensation making this likely.  EKG not consistent with ACS.  Chest x-ray negative for pneumonia or pneumothorax or fluid.  Lab work significant only for mild elevation of white blood cell count which is nonspecific.  Chemistries are normal.  Abdominal exam is normal, no tenderness to palpation to suggest pancreatitis.  Treated with GI cocktail with significant improvement in symptoms.  Will treat for GERD, outpatient follow-up, return precautions discussed     ____________________________________________   FINAL CLINICAL IMPRESSION(S) / ED DIAGNOSES  Final diagnoses:  Gastroesophageal reflux disease with esophagitis without hemorrhage  Atypical chest pain        Note:  This document was prepared using Dragon voice recognition software and may include unintentional dictation errors.   Lavonia Drafts, MD 04/25/20 2045

## 2020-04-25 NOTE — ED Notes (Signed)
Signature Pad not working at time of discharge.

## 2020-04-25 NOTE — ED Triage Notes (Signed)
C/O indigestion.  Chest pain, dizziness, and hives.  Patient is AAOx3.  Skin warm and dry. NAD

## 2020-04-25 NOTE — ED Notes (Signed)
Pt updated, VS reassessed

## 2020-04-26 ENCOUNTER — Observation Stay
Admission: EM | Admit: 2020-04-26 | Discharge: 2020-04-27 | Disposition: A | Discharge status: Home / Self Care | Payer: Self-pay | Attending: Internal Medicine | Admitting: Internal Medicine

## 2020-04-26 ENCOUNTER — Other Ambulatory Visit: Payer: Self-pay

## 2020-04-26 ENCOUNTER — Emergency Department: Payer: Self-pay

## 2020-04-26 ENCOUNTER — Encounter: Payer: Self-pay | Admitting: Emergency Medicine

## 2020-04-26 DIAGNOSIS — Z95828 Presence of other vascular implants and grafts: Secondary | ICD-10-CM

## 2020-04-26 DIAGNOSIS — K529 Noninfective gastroenteritis and colitis, unspecified: Principal | ICD-10-CM | POA: Insufficient documentation

## 2020-04-26 DIAGNOSIS — L509 Urticaria, unspecified: Secondary | ICD-10-CM | POA: Insufficient documentation

## 2020-04-26 DIAGNOSIS — K219 Gastro-esophageal reflux disease without esophagitis: Secondary | ICD-10-CM | POA: Insufficient documentation

## 2020-04-26 DIAGNOSIS — I2510 Atherosclerotic heart disease of native coronary artery without angina pectoris: Secondary | ICD-10-CM | POA: Insufficient documentation

## 2020-04-26 DIAGNOSIS — Z20822 Contact with and (suspected) exposure to covid-19: Secondary | ICD-10-CM | POA: Insufficient documentation

## 2020-04-26 DIAGNOSIS — I10 Essential (primary) hypertension: Secondary | ICD-10-CM | POA: Insufficient documentation

## 2020-04-26 LAB — CBC
HCT: 37.3 % (ref 36.0–46.0)
Hemoglobin: 12.9 g/dL (ref 12.0–15.0)
MCH: 27.6 pg (ref 26.0–34.0)
MCHC: 34.6 g/dL (ref 30.0–36.0)
MCV: 79.7 fL — ABNORMAL LOW (ref 80.0–100.0)
Platelets: 447 10*3/uL — ABNORMAL HIGH (ref 150–400)
RBC: 4.68 MIL/uL (ref 3.87–5.11)
RDW: 16.1 % — ABNORMAL HIGH (ref 11.5–15.5)
WBC: 15.8 10*3/uL — ABNORMAL HIGH (ref 4.0–10.5)
nRBC: 0 % (ref 0.0–0.2)

## 2020-04-26 LAB — URINALYSIS, COMPLETE (UACMP) WITH MICROSCOPIC
Bilirubin Urine: NEGATIVE
Glucose, UA: >=500 mg/dL — AB
Hgb urine dipstick: NEGATIVE
Ketones, ur: NEGATIVE mg/dL
Leukocytes,Ua: NEGATIVE
Nitrite: NEGATIVE
Protein, ur: NEGATIVE mg/dL
Specific Gravity, Urine: >1.046 — ABNORMAL HIGH (ref 1.005–1.030)
pH: 5 (ref 5.0–8.0)

## 2020-04-26 LAB — COMPREHENSIVE METABOLIC PANEL
ALT: 23 U/L (ref 0–44)
AST: 21 U/L (ref 15–41)
Albumin: 4.3 g/dL (ref 3.5–5.0)
Alkaline Phosphatase: 93 U/L (ref 38–126)
Anion gap: 14 (ref 5–15)
BUN: 17 mg/dL (ref 6–20)
CO2: 21 mmol/L — ABNORMAL LOW (ref 22–32)
Calcium: 9 mg/dL (ref 8.9–10.3)
Chloride: 102 mmol/L (ref 98–111)
Creatinine, Ser: 1.08 mg/dL — ABNORMAL HIGH (ref 0.44–1.00)
GFR calc Af Amer: >60 mL/min (ref >60)
GFR calc non Af Amer: 58 mL/min — ABNORMAL LOW (ref >60)
Glucose, Bld: 192 mg/dL — ABNORMAL HIGH (ref 70–99)
Potassium: 3.9 mmol/L (ref 3.5–5.1)
Sodium: 137 mmol/L (ref 135–145)
Total Bilirubin: 1.1 mg/dL (ref 0.3–1.2)
Total Protein: 8.1 g/dL (ref 6.5–8.1)

## 2020-04-26 LAB — LIPASE, BLOOD: Lipase: 22 U/L (ref 11–51)

## 2020-04-26 LAB — SARS CORONAVIRUS 2 BY RT PCR (HOSPITAL ORDER, PERFORMED IN ~~LOC~~ HOSPITAL LAB): SARS Coronavirus 2: NEGATIVE

## 2020-04-26 MED ORDER — SODIUM CHLORIDE 0.9 % IV SOLN: Freq: Once | INTRAVENOUS | Status: AC

## 2020-04-26 MED ORDER — METOPROLOL SUCCINATE ER 50 MG PO TB24
50.0000 mg | ORAL_TABLET | Freq: Every day | ORAL | Status: DC
  Administered 2020-04-27: 50 mg via ORAL
  Filled 2020-04-26: qty 1

## 2020-04-26 MED ORDER — MORPHINE SULFATE (PF) 4 MG/ML IV SOLN
4.0000 mg | Freq: Once | INTRAVENOUS | Status: AC
  Administered 2020-04-26: 4 mg via INTRAVENOUS
  Filled 2020-04-26: qty 1

## 2020-04-26 MED ORDER — DIPHENHYDRAMINE HCL 50 MG/ML IJ SOLN
25.0000 mg | Freq: Once | INTRAMUSCULAR | Status: AC
  Administered 2020-04-26: 25 mg via INTRAVENOUS
  Filled 2020-04-26: qty 1

## 2020-04-26 MED ORDER — ONDANSETRON 4 MG PO TBDP: 4.0000 mg | ORAL_TABLET | Freq: Once | ORAL | Status: DC | PRN

## 2020-04-26 MED ORDER — ATORVASTATIN CALCIUM 20 MG PO TABS
80.0000 mg | ORAL_TABLET | Freq: Every day | ORAL | Status: DC
  Administered 2020-04-27: 80 mg via ORAL
  Filled 2020-04-26: qty 4

## 2020-04-26 MED ORDER — ASPIRIN EC 81 MG PO TBEC
81.0000 mg | DELAYED_RELEASE_TABLET | Freq: Every day | ORAL | Status: DC
  Administered 2020-04-27: 81 mg via ORAL
  Filled 2020-04-26: qty 1

## 2020-04-26 MED ORDER — NITROGLYCERIN 0.4 MG SL SUBL: 0.4000 mg | SUBLINGUAL_TABLET | SUBLINGUAL | Status: DC | PRN

## 2020-04-26 MED ORDER — ENOXAPARIN SODIUM 40 MG/0.4ML ~~LOC~~ SOLN
40.0000 mg | SUBCUTANEOUS | Status: DC
  Filled 2020-04-26: qty 0.4

## 2020-04-26 MED ORDER — ONDANSETRON HCL 4 MG/2ML IJ SOLN
4.0000 mg | Freq: Four times a day (QID) | INTRAMUSCULAR | Status: DC | PRN
  Filled 2020-04-26: qty 2

## 2020-04-26 MED ORDER — ONDANSETRON HCL 4 MG/2ML IJ SOLN
4.0000 mg | Freq: Once | INTRAMUSCULAR | Status: AC
  Administered 2020-04-26: 4 mg via INTRAVENOUS
  Filled 2020-04-26: qty 2

## 2020-04-26 MED ORDER — ONDANSETRON HCL 4 MG PO TABS: 4.0000 mg | ORAL_TABLET | Freq: Four times a day (QID) | ORAL | Status: DC | PRN

## 2020-04-26 MED ORDER — IOHEXOL 300 MG/ML  SOLN
100.0000 mL | Freq: Once | INTRAMUSCULAR | Status: AC | PRN
  Administered 2020-04-26: 100 mL via INTRAVENOUS

## 2020-04-26 MED ORDER — ACETAMINOPHEN 650 MG RE SUPP: 650.0000 mg | Freq: Four times a day (QID) | RECTAL | Status: DC | PRN

## 2020-04-26 MED ORDER — PANTOPRAZOLE SODIUM 40 MG IV SOLR
40.0000 mg | Freq: Every day | INTRAVENOUS | Status: DC
  Administered 2020-04-27: 40 mg via INTRAVENOUS
  Filled 2020-04-26: qty 40

## 2020-04-26 MED ORDER — METHYLPREDNISOLONE SODIUM SUCC 125 MG IJ SOLR
125.0000 mg | Freq: Once | INTRAMUSCULAR | Status: AC
  Administered 2020-04-26: 125 mg via INTRAVENOUS
  Filled 2020-04-26: qty 2

## 2020-04-26 MED ORDER — SODIUM CHLORIDE 0.9 % IV BOLUS
1000.0000 mL | Freq: Once | INTRAVENOUS | Status: AC
  Administered 2020-04-26: 1000 mL via INTRAVENOUS

## 2020-04-26 MED ORDER — ACETAMINOPHEN 325 MG PO TABS: 650.0000 mg | ORAL_TABLET | Freq: Four times a day (QID) | ORAL | Status: DC | PRN

## 2020-04-26 MED ORDER — SODIUM CHLORIDE 0.9 % IV SOLN: INTRAVENOUS | Status: DC

## 2020-04-26 NOTE — ED Triage Notes (Addendum)
Patient reports N/V/D since yesterday. Unsure of fevers. Was seen yesterday and diagnosed with GERD.   Patient also complaining of hives to arms and legs since yesterday.

## 2020-04-26 NOTE — ED Provider Notes (Signed)
ER Provider Note       Time seen: 8:04 PM    I have reviewed the vital signs and the nursing notes.  HISTORY   Chief Complaint Emesis and Diarrhea    HPI Heather Mahoney is a 54 y.o. female with a history of coronary artery disease, GERD, hyperlipidemia, hypertension who presents today for nausea, vomiting and diarrhea since yesterday.  Patient seen yesterday and diagnosed with GERD.  She was also complaining of hives to her arms and legs since yesterday.  She was given fluids prior to my evaluation, describes 10 out of 10 abdominal pain.  Past Medical History:  Diagnosis Date  . CAD (coronary artery disease)    a. PCI to proximal and distal RCA 3/26  . GERD (gastroesophageal reflux disease)   . Heart murmur    MR  . Hiatal hernia   . Hyperlipidemia   . Hypertension   . Tobacco abuse    a. Started at age 59, quit during 3 pregnancies. b. 1 PPD for a 40 pack-year hx (2019)     Past Surgical History:  Procedure Laterality Date  . BREAST BIOPSY Right    Multiple biopsies-all benign  . BREAST SURGERY  1986   I&D for 'milk duct'  . CORONARY STENT INTERVENTION N/A 01/13/2019   Procedure: CORONARY STENT INTERVENTION;  Surgeon: Nelva Bush, MD;  Location: [place] [facility];  Service: Cardiovascular;  Laterality: N/A;  . EMBOLECTOMY  08/13/2018   Procedure: Right SFA and profunda femoris Fogarty embolectomy 3  Fogarty embolectomy balloon;  Surgeon: Algernon Huxley, MD;  Location: [facility];  Service: Vascular;;  . ENDARTERECTOMY FEMORAL Right 08/13/2018   Procedure: Right common femoral, profunda femoris, and superficial femoral artery endarterectomies and patch angioplasty ;  Surgeon: Algernon Huxley, MD;  Location: [facility];  Service: Vascular;  Laterality: Right;  . ENDOVASCULAR REPAIR/STENT GRAFT N/A 08/13/2018   Procedure: ENDOVASCULAR REPAIR/STENT GRAFT;  Surgeon: Katha Cabal, MD;  Location: [place] [facility];  Service: Cardiovascular;  Laterality: N/A;  . LEFT  HEART CATH AND CORONARY ANGIOGRAPHY Left 01/13/2019   Procedure: LEFT HEART CATH AND CORONARY ANGIOGRAPHY;  Surgeon: Minna Merritts, MD;  Location: [place] [facility];  Service: Cardiovascular;  Laterality: Left;  . SALPINGOOPHORECTOMY Left 2000    Allergies Strawberry extract and Strawberry flavor   Review of Systems Constitutional: Negative for fever. Cardiovascular: Negative for chest pain. Respiratory: Negative for shortness of breath. Gastrointestinal: Positive for abdominal pain with vomiting and diarrhea Musculoskeletal: Negative for back pain. Skin: Positive for hives Neurological: Negative for headaches, focal weakness or numbness.  All systems negative/normal/unremarkable except as stated in the HPI  ____________________________________________   PHYSICAL EXAM:  VITAL SIGNS: Vitals:   04/26/20 1543 04/26/20 1647  BP: 130/83   Pulse: (!) 130 (!) 110  Resp: (!) 24 18  Temp: 98.9 F (37.2 C)   SpO2: 98% 98%    Constitutional: Alert and oriented.  Mild distress is noted Eyes: Conjunctivae are normal. Normal extraocular movements. ENT      Head: Normocephalic and atraumatic.      Nose: No congestion/rhinnorhea.      Mouth/Throat: Mucous membranes are moist.      Neck: No stridor. Cardiovascular: Rapid rate, regular rhythm. No murmurs, rubs, or gallops. Respiratory: Normal respiratory effort without tachypnea nor retractions. Breath sounds are clear and equal bilaterally. No wheezes/rales/rhonchi. Gastrointestinal: Nonfocal lower abdominal tenderness.  Normal bowel sounds. Musculoskeletal: Nontender with normal range of motion in extremities. No  lower extremity tenderness nor edema. Neurologic:  Normal speech and language. No gross focal neurologic deficits are appreciated.  Skin:  Skin is warm, dry with diffuse urticaria Psychiatric: Speech and behavior are normal.  ____________________________________________   LABS (pertinent positives/negatives)  Labs  Reviewed  COMPREHENSIVE METABOLIC PANEL - Abnormal; Notable for the following components:      Result Value   CO2 21 (*)    Glucose, Bld 192 (*)    Creatinine, Ser 1.08 (*)    GFR calc non Af Amer 58 (*)    All other components within normal limits  CBC - Abnormal; Notable for the following components:   WBC 15.8 (*)    MCV 79.7 (*)    RDW 16.1 (*)    Platelets 447 (*)    All other components within normal limits  LIPASE, BLOOD  URINALYSIS, COMPLETE (UACMP) WITH MICROSCOPIC    RADIOLOGY  Images were viewed by me CT the abdomen pelvis with contrast IMPRESSION: No acute abnormality or findings to account for reported symptoms.  Stable chronic findings detailed above.  DIFFERENTIAL DIAGNOSIS  Viral syndrome, gastroenteritis, GERD, peptic ulcer disease, colitis, urticaria  ASSESSMENT AND PLAN  Gastroenteritis, urticaria   Plan: The patient had presented for nausea, vomiting and diarrhea since yesterday with urticaria. Patient's labs did reveal an increasing white count compared to yesterday but otherwise was unremarkable.  Patient was given fluids, antiemetics and pain medicine.  CT the abdomen pelvis was unremarkable.  Despite this she states she does not feel well enough to go home.  I will discuss with the hospitalist for admission.  Lenise Arena MD    Note: This note was generated in part or whole with voice recognition software. Voice recognition is usually quite accurate but there are transcription errors that can and very often do occur. I apologize for any typographical errors that were not detected and corrected.     Earleen Newport, MD 04/26/20 2137

## 2020-04-26 NOTE — ED Notes (Signed)
Pt ambulatory to bathroom independently at this time.

## 2020-04-26 NOTE — H&P (Signed)
History and Physical    Heather Mahoney JXB:[id number] DOB: [birth year] DOA: 04/26/2020  PCP: Theotis Burrow, MD   Patient coming from: home  I have personally briefly reviewed patient's old medical records in [place]  Chief Complaint: Vomiting, diarrhea, abdominal pain, itching  HPI: Heather Mahoney is a 54 y.o. female with medical history significant for CAD with history of stent March 2020, AAA with endovascular stent placement, hypertension, who presents to the emergency room for the second time in 24 hours with intractable vomiting, epigastric pain.  Had diarrhea but that has since resolved.  Her vomiting was preceded by an episode of hives which is usual for her and she had been taking Benadryl with relief in her symptoms.  She denies fever or chills.  Pain is epigastric of moderate to severe intensity, nonradiating, worsened by episodes of vomiting.  No alleviating factors.  On her first visit to the emergency room she had mainly abdominal pain and was treated with a GI cocktail with improvement in her symptoms.  She was discharged on sucralfate but return to the emergency room due onset of vomiting and diarrhea.  Additionally, has been having an intractable generalized itch and hives.  She is allergic to strawberries but denies coming in contact with any known allergen and denies any new medication. ED Course: On arrival she was tachycardic at 130, tachypneic at 24, afebrile with normal O2 sat on room air.  Blood work revealed WBC of 15,000, up from 12,000 the day prior.  Lipase was normal at 22.  CT abdomen and pelvis showed no acute findings.  Hospitalist consulted for admission.  Review of Systems: As per HPI otherwise all other systems on review of systems negative.    Past Medical History:  Diagnosis Date  . CAD (coronary artery disease)    a. PCI to proximal and distal RCA 3/26  . GERD (gastroesophageal reflux disease)   . Heart murmur    MR  . Hiatal hernia   .  Hyperlipidemia   . Hypertension   . Tobacco abuse    a. Started at age 84, quit during 3 pregnancies. b. 1 PPD for a 40 pack-year hx (2019)     Past Surgical History:  Procedure Laterality Date  . BREAST BIOPSY Right    Multiple biopsies-all benign  . BREAST SURGERY  1986   I&D for 'milk duct'  . CORONARY STENT INTERVENTION N/A 01/13/2019   Procedure: CORONARY STENT INTERVENTION;  Surgeon: Nelva Bush, MD;  Location: [facility];  Service: Cardiovascular;  Laterality: N/A;  . EMBOLECTOMY  08/13/2018   Procedure: Right SFA and profunda femoris Fogarty embolectomy 3  Fogarty embolectomy balloon;  Surgeon: Algernon Huxley, MD;  Location: [facility];  Service: Vascular;;  . ENDARTERECTOMY FEMORAL Right 08/13/2018   Procedure: Right common femoral, profunda femoris, and superficial femoral artery endarterectomies and patch angioplasty ;  Surgeon: Algernon Huxley, MD;  Location: [facility];  Service: Vascular;  Laterality: Right;  . ENDOVASCULAR REPAIR/STENT GRAFT N/A 08/13/2018   Procedure: ENDOVASCULAR REPAIR/STENT GRAFT;  Surgeon: Katha Cabal, MD;  Location: [facility];  Service: Cardiovascular;  Laterality: N/A;  . LEFT HEART CATH AND CORONARY ANGIOGRAPHY Left 01/13/2019   Procedure: LEFT HEART CATH AND CORONARY ANGIOGRAPHY;  Surgeon: Minna Merritts, MD;  Location: [place] [facility];  Service: Cardiovascular;  Laterality: Left;  . SALPINGOOPHORECTOMY Left 2000     reports that she quit smoking about 20 months ago. Her  smoking use included cigarettes. She has a 9.50 pack-year smoking history. She has never used smokeless tobacco. She reports current alcohol use. She reports current drug use. Drug: Marijuana.  Allergies  Allergen Reactions  . Strawberry Extract Hives and Swelling  . Strawberry Flavor Rash    Family History  Problem Relation Age of Onset  . Diabetes Mother   . Hypertension Mother   . Hyperlipidemia Mother   . Heart disease Mother        CABG  X 4  . Asthma Mother   . Sarcoidosis Mother        In remission  . Heart attack Mother   . Diabetes Maternal Grandmother   . Heart disease Maternal Grandmother   . Hyperlipidemia Maternal Grandmother       Prior to Admission medications   Medication Sig Start Date End Date Taking? Authorizing Provider  aspirin 81 MG EC tablet Take 1 tablet (81 mg total) by mouth daily. 01/14/19   Marrianne Mood D, PA-C  CATAPRES 0.1 MG tablet Take 1 tablet (0.1 mg total) by mouth 2 (two) times daily. 10/26/19   Minna Merritts, MD  cholecalciferol (VITAMIN D3) 25 MCG (1000 UNIT) tablet Take 1,000 Units by mouth 2 (two) times daily.    [provider]  COZAAR 100 MG tablet Take 1 tablet (100 mg total) by mouth daily. 10/26/19   Minna Merritts, MD  docusate sodium (COLACE) 100 MG capsule Take 1 capsule (100 mg total) by mouth daily. 08/16/18   Vaughan Basta, MD  ezetimibe (ZETIA) 10 MG tablet Take 1 tablet (10 mg total) by mouth daily. 03/28/20   Minna Merritts, MD  LIPITOR 80 MG tablet Take 1 tablet (80 mg total) by mouth daily. 10/26/19   Minna Merritts, MD  metFORMIN (GLUCOPHAGE) 500 MG tablet Take 500 mg by mouth 2 (two) times daily with a meal.    [provider]  nitroGLYCERIN (NITROSTAT) 0.4 MG SL tablet Place 1 tablet (0.4 mg total) under the tongue every 5 (five) minutes as needed for chest pain. 01/26/19 03/28/20  Minna Merritts, MD  NORVASC 10 MG tablet Take 1 tablet (10 mg total) by mouth daily. 10/26/19   Minna Merritts, MD  PLAVIX 75 MG tablet Take 1 tablet (75 mg total) by mouth daily. 10/26/19   Minna Merritts, MD  sucralfate (CARAFATE) 1 g tablet Take 1 tablet (1 g total) by mouth 4 (four) times daily -  with meals and at bedtime. 04/25/20   Lavonia Drafts, MD  TOPROL XL 50 MG 24 hr tablet Take 1 tablet (50 mg total) by mouth daily. Take with or immediately following a meal. 10/26/19   Minna Merritts, MD    Physical Exam: Vitals:   04/26/20 1647 04/26/20  2018 04/26/20 2048 04/26/20 2201  BP:  124/79  136/86  Pulse: (!) 110  (!) 102 90  Resp: 18   16  Temp:      TempSrc:      SpO2: 98%  93% 98%  Weight:      Height:         Vitals:   04/26/20 1647 04/26/20 2018 04/26/20 2048 04/26/20 2201  BP:  124/79  136/86  Pulse: (!) 110  (!) 102 90  Resp: 18   16  Temp:      TempSrc:      SpO2: 98%  93% 98%  Weight:      Height:  Constitutional: Alert and oriented x 3 .  Patient appears very uncomfortable with generalized itching HEENT:      Head: Normocephalic and atraumatic.         Eyes: PERLA, EOMI, Conjunctivae are normal. Sclera is non-icteric.       Mouth/Throat: Mucous membranes are moist.       Neck: Supple with no signs of meningismus. Cardiovascular: Regular rate and rhythm. No murmurs, gallops, or rubs. 2+ symmetrical distal pulses are present . No JVD. No LE edema Respiratory: Respiratory effort normal .Lungs sounds clear bilaterally. No wheezes, crackles, or rhonchi.  Gastrointestinal: Soft, non tender, and non distended with positive bowel sounds. No rebound or guarding. Genitourinary: No CVA tenderness. Musculoskeletal: Nontender with normal range of motion in all extremities. No cyanosis, or erythema of extremities. Neurologic: Normal speech and language. Face is symmetric. Moving all extremities. No gross focal neurologic deficits . Skin:  Generalized urticarial lesions mostly on extremities Psychiatric: Mood and affect are normal Speech and behavior are normal   Labs on Admission: I have personally reviewed following labs and imaging studies  CBC: Recent Labs  Lab 04/25/20 0851 04/26/20 1546  WBC 12.8* 15.8*  HGB 11.9* 12.9  HCT 35.7* 37.3  MCV 81.7 79.7*  PLT 421* 856*   Basic Metabolic Panel: Recent Labs  Lab 04/25/20 0851 04/26/20 1546  NA 137 137  K 3.9 3.9  CL 104 102  CO2 24 21*  GLUCOSE 120* 192*  BUN 16 17  CREATININE 0.93 1.08*  CALCIUM 9.1 9.0   GFR: Estimated Creatinine  Clearance: 62.3 mL/min (A) (by C-G formula based on SCr of 1.08 mg/dL (H)). Liver Function Tests: Recent Labs  Lab 04/26/20 1546  AST 21  ALT 23  ALKPHOS 93  BILITOT 1.1  PROT 8.1  ALBUMIN 4.3   Recent Labs  Lab 04/26/20 1546  LIPASE 22   No results for input(s): AMMONIA in the last 168 hours. Coagulation Profile: No results for input(s): INR, PROTIME in the last 168 hours. Cardiac Enzymes: No results for input(s): CKTOTAL, CKMB, CKMBINDEX, TROPONINI in the last 168 hours. BNP (last 3 results) No results for input(s): PROBNP in the last 8760 hours. HbA1C: No results for input(s): HGBA1C in the last 72 hours. CBG: No results for input(s): GLUCAP in the last 168 hours. Lipid Profile: No results for input(s): CHOL, HDL, LDLCALC, TRIG, CHOLHDL, LDLDIRECT in the last 72 hours. Thyroid Function Tests: No results for input(s): TSH, T4TOTAL, FREET4, T3FREE, THYROIDAB in the last 72 hours. Anemia Panel: No results for input(s): VITAMINB12, FOLATE, FERRITIN, TIBC, IRON, RETICCTPCT in the last 72 hours. Urine analysis:    Component Value Date/Time   COLORURINE STRAW (A) 08/10/2018 1607   APPEARANCEUR CLEAR (A) 08/10/2018 1607   LABSPEC 1.006 08/10/2018 1607   PHURINE 6.0 08/10/2018 1607   GLUCOSEU NEGATIVE 08/10/2018 1607   HGBUR SMALL (A) 08/10/2018 1607   BILIRUBINUR NEGATIVE 08/10/2018 1607   KETONESUR NEGATIVE 08/10/2018 1607   PROTEINUR NEGATIVE 08/10/2018 1607   NITRITE NEGATIVE 08/10/2018 1607   LEUKOCYTESUR NEGATIVE 08/10/2018 1607    Radiological Exams on Admission: DG Chest 2 View  Result Date: 04/25/2020 CLINICAL DATA:  LEFT side chest pain for 1 day radiating down LEFT arm, dizziness, lightheadedness, slight shortness of breath, former smoker, coronary artery disease post stenting, hypertension, GERD EXAM: CHEST - 2 VIEW COMPARISON:  04/09/2019 FINDINGS: Normal heart size, mediastinal contours, and pulmonary vascularity. Coronary stent noted. Minimal chronic  peribronchial thickening. No pulmonary infiltrate, pleural effusion, or pneumothorax.  Bones unremarkable. IMPRESSION: Minimal chronic bronchitic changes without infiltrate. Electronically Signed   By: Lavonia Dana M.D.   On: 04/25/2020 09:17   CT ABDOMEN PELVIS W CONTRAST  Result Date: 04/26/2020 CLINICAL DATA:  Abdominal pain EXAM: CT ABDOMEN AND PELVIS WITH CONTRAST TECHNIQUE: Multidetector CT imaging of the abdomen and pelvis was performed using the standard protocol following bolus administration of intravenous contrast. CONTRAST:  118mL OMNIPAQUE IOHEXOL 300 MG/ML  SOLN COMPARISON:  04/09/2019 FINDINGS: Lower chest: No acute abnormality. Hepatobiliary: No focal liver abnormality is seen. No gallstones, gallbladder wall thickening, or biliary dilatation. Pancreas: Unremarkable. Spleen: Unremarkable. Adrenals/Urinary Tract: Punctate bilateral nonobstructing renal calculi. Adrenals are unremarkable. Partially distended bladder is unremarkable. Stomach/Bowel: Stomach is within normal limits. Bowel is normal in caliber. Distal colonic diverticulosis. Normal appendix. Vascular/Lymphatic: Infrarenal aortoiliac stent graft. Aortic atherosclerosis. No enlarged lymph nodes identified. Reproductive: Uterus and bilateral adnexa are unremarkable. Other: Small fat containing paraumbilical hernia.  No ascites. Musculoskeletal: No significant or acute osseous abnormality. IMPRESSION: No acute abnormality or findings to account for reported symptoms. Stable chronic findings detailed above. Electronically Signed   By: Macy Mis M.D.   On: 04/26/2020 20:43    EKG: Independently reviewed. Interpretation : Normal sinus rhythm with no acute ST-T wave changes Assessment/Plan 54 year old female with history of CAD status post stent and AAA status post endovascular stent presenting with nausea vomiting diarrhea and abdominal pain, with additional complaint of hives and itching.    Acute gastroenteritis -Suspect viral  gastroenteritis.  Lipase normal at 22.  CT abdomen and pelvis with no acute findings -Supportive care with IV hydration, IV antiemetics, pain medication -Clear liquid diet and advance as tolerated -GI panel if patient able to provide specimen -IV Protonix  Urticaria -No known allergen exposure -IV Benadryl and methylprednisolone x1 to follow-up thereafter oral hydroxyzine as needed and prednisone    Hypertension -Continue home medication    CAD (coronary artery disease) -Continue atorvastatin, aspirin and metoprolol    History of endovascular stent graft for abdominal aortic aneurysm -No acute concerns at this time    DVT prophylaxis: Lovenox  Code Status: full code  Family Communication:  none  Disposition Plan: Back to previous home environment Consults called: none  Status: Observation    Athena Masse MD [facility]     04/26/2020, 10:25 PM

## 2020-04-26 NOTE — ED Notes (Signed)
Pt to CT

## 2020-04-27 ENCOUNTER — Other Ambulatory Visit: Payer: Self-pay

## 2020-04-27 ENCOUNTER — Encounter: Payer: Self-pay | Admitting: Internal Medicine

## 2020-04-27 LAB — CBC
HCT: 35.3 % — ABNORMAL LOW (ref 36.0–46.0)
Hemoglobin: 11.4 g/dL — ABNORMAL LOW (ref 12.0–15.0)
MCH: 27.1 pg (ref 26.0–34.0)
MCHC: 32.3 g/dL (ref 30.0–36.0)
MCV: 83.8 fL (ref 80.0–100.0)
Platelets: 413 10*3/uL — ABNORMAL HIGH (ref 150–400)
RBC: 4.21 MIL/uL (ref 3.87–5.11)
RDW: 16.2 % — ABNORMAL HIGH (ref 11.5–15.5)
WBC: 10.4 10*3/uL (ref 4.0–10.5)
nRBC: 0 % (ref 0.0–0.2)

## 2020-04-27 LAB — BASIC METABOLIC PANEL
Anion gap: 10 (ref 5–15)
BUN: 15 mg/dL (ref 6–20)
CO2: 23 mmol/L (ref 22–32)
Calcium: 8.5 mg/dL — ABNORMAL LOW (ref 8.9–10.3)
Chloride: 106 mmol/L (ref 98–111)
Creatinine, Ser: 0.86 mg/dL (ref 0.44–1.00)
GFR calc Af Amer: >60 mL/min (ref >60)
GFR calc non Af Amer: >60 mL/min (ref >60)
Glucose, Bld: 154 mg/dL — ABNORMAL HIGH (ref 70–99)
Potassium: 4.1 mmol/L (ref 3.5–5.1)
Sodium: 139 mmol/L (ref 135–145)

## 2020-04-27 LAB — HIV ANTIBODY (ROUTINE TESTING W REFLEX): HIV Screen 4th Generation wRfx: NONREACTIVE

## 2020-04-27 MED ORDER — HYDROXYZINE HCL 50 MG PO TABS
50.0000 mg | ORAL_TABLET | ORAL | Status: DC | PRN
  Administered 2020-04-27 (×2): 50 mg via ORAL
  Filled 2020-04-27 (×3): qty 1

## 2020-04-27 MED ORDER — PNEUMOCOCCAL VAC POLYVALENT 25 MCG/0.5ML IJ INJ: 0.5000 mL | INJECTION | INTRAMUSCULAR | Status: DC

## 2020-04-27 MED ORDER — METHYLPREDNISOLONE SODIUM SUCC 40 MG IJ SOLR
40.0000 mg | Freq: Once | INTRAMUSCULAR | Status: AC
  Administered 2020-04-27: 40 mg via INTRAVENOUS
  Filled 2020-04-27: qty 1

## 2020-04-27 MED ORDER — HYDROXYZINE HCL 50 MG PO TABS: 50.0000 mg | ORAL_TABLET | ORAL | Status: DC | PRN

## 2020-04-27 MED ORDER — HYDROXYZINE HCL 50 MG PO TABS
50.0000 mg | ORAL_TABLET | ORAL | Status: DC | PRN
  Filled 2020-04-27: qty 1

## 2020-04-27 MED ORDER — PREDNISONE 20 MG PO TABS
20.0000 mg | ORAL_TABLET | Freq: Every day | ORAL | Status: DC
  Administered 2020-04-27: 20 mg via ORAL
  Filled 2020-04-27: qty 1

## 2020-04-27 MED ORDER — DIPHENHYDRAMINE HCL 50 MG/ML IJ SOLN
25.0000 mg | Freq: Once | INTRAMUSCULAR | Status: AC
  Administered 2020-04-27: 25 mg via INTRAVENOUS
  Filled 2020-04-27: qty 1

## 2020-04-27 NOTE — Progress Notes (Signed)
Pt d/c home this afternoon via private car driven by daughter.  NAD noted at d/c.  D/C paperwork reviewed with pt and she expressed understanding. IV from pts L AC removed without issue.  Pt was wheeled down to med mall and assisted into car.

## 2020-04-27 NOTE — Plan of Care (Signed)
  Problem: Health Behavior/Discharge Planning: Goal: Ability to manage health-related needs will improve Outcome: Progressing   Problem: Clinical Measurements: Goal: Ability to maintain clinical measurements within normal limits will improve Outcome: Progressing Goal: Will remain free from infection Outcome: Progressing Goal: Respiratory complications will improve Outcome: Progressing   Problem: Activity: Goal: Risk for activity intolerance will decrease Outcome: Progressing   Problem: Nutrition: Goal: Adequate nutrition will be maintained Outcome: Progressing   Problem: Coping: Goal: Level of anxiety will decrease Outcome: Progressing   Problem: Elimination: Goal: Will not experience complications related to bowel motility Outcome: Progressing Goal: Will not experience complications related to urinary retention Outcome: Progressing   Problem: Pain Managment: Goal: General experience of comfort will improve Outcome: Progressing   Problem: Safety: Goal: Ability to remain free from injury will improve Outcome: Progressing   Problem: Skin Integrity: Goal: Risk for impaired skin integrity will decrease Outcome: Progressing

## 2020-04-27 NOTE — Discharge Summary (Signed)
Heather Mahoney QBH:[id number] DOB: 09-Apr-1966 DOA: 04/26/2020  PCP: Theotis Burrow, MD  Admit date: 04/26/2020 Discharge date: 04/27/2020  Admitted From: Home Disposition: Home  Recommendations for Outpatient Follow-up:  1. Follow up with PCP in 1 week 2. Please obtain BMP/CBC in one week    Discharge Condition:Stable CODE STATUS: Full Diet recommendation: Heart Healthy  Brief/Interim Summary: Heather Mahoney is a 54 y.o. female with medical history significant for CAD with history of stent March 2020, AAA with endovascular stent placement, hypertension, who presents to the emergency room for the second time in 24 hours with intractable vomiting, epigastric pain.  Had diarrhea but that has since resolved.  Her vomiting was preceded by an episode of hives which is usual for her and she had been taking Benadryl with relief in her symptoms.  She denies fever or chills.  Pain is epigastric of moderate to severe intensity, nonradiating, worsened by episodes of vomiting.  No alleviating factors.  On her first visit to the emergency room she had mainly abdominal pain and was treated with a GI cocktail with improvement in her symptoms.  She was discharged on sucralfate but return to the emergency room due onset of vomiting and diarrhea.  Additionally, has been having an intractable generalized itch and hives.  She is allergic to strawberries but denies coming in contact with any known allergen and denies any new medication.CT abdomen and pelvis showed no acute findings.  She was started on steroid and Benadryl which resolved her hives.  This a.m. patient denies any vomiting or diarrhea.  Asking for food which she was given solids and tolerated.  Overall feels much better.  Likely cause of her diarrhea may have been due to acute gastroenteritis.  Her [place] improved with IV Benadryl and steroids.  The allergen exposure is unknown.  We will need to follow-up with the primary care for further evaluations.   As far as her home medications for her blood pressure, her blood pressure was stable I did tell the patient to check her blood pressure and once it starts increasing to take 1 medication at a time and to follow-up with primary care for further evaluation.  We had to hold her blood pressure medication as inpatient due to her diarrhea as her blood pressure was able here.  She verbalizes an understanding and will check her blood pressure at home.  She is stable for discharge with resolution of all her symptoms.     Discharge Diagnoses:  Principal Problem:   Acute gastroenteritis Active Problems:   Hypertension   CAD (coronary artery disease)   History of endovascular stent graft for abdominal aortic aneurysm   Urticaria    Discharge Instructions  Discharge Instructions    Call MD for:  persistant nausea and vomiting   Complete by: As directed    Call MD for:  temperature >100.4   Complete by: As directed    Diet - low sodium heart healthy   Complete by: As directed    Discharge instructions   Complete by: As directed    I held your blood pressure medications. Please check you blood pressure at home once it starts going up can resume medication one at a time. Please hydrate good.   Increase activity slowly   Complete by: As directed      Allergies as of 04/27/2020      Reactions   Strawberry Extract Hives, Swelling   Strawberry Flavor Rash      Medication  List    STOP taking these medications   Catapres 0.1 MG tablet Generic drug: cloNIDine   Cozaar 100 MG tablet Generic drug: losartan   Norvasc 10 MG tablet Generic drug: amLODipine   Toprol XL 50 MG 24 hr tablet Generic drug: metoprolol succinate     TAKE these medications   aspirin 81 MG EC tablet Take 1 tablet (81 mg total) by mouth daily. Notes to patient: Last dose given 04/27/2020 at 08:24am   cholecalciferol 25 MCG (1000 UNIT) tablet Commonly known as: VITAMIN D3 Take 1,000 Units by mouth 2 (two) times  daily. Notes to patient: Not given while in hospital.    docusate sodium 100 MG capsule Commonly known as: COLACE Take 1 capsule (100 mg total) by mouth daily. Notes to patient: Not given while in hospital.     ezetimibe 10 MG tablet Commonly known as: ZETIA Take 1 tablet (10 mg total) by mouth daily. Notes to patient: Not given while in hospital.     Lipitor 80 MG tablet Generic drug: atorvastatin Take 1 tablet (80 mg total) by mouth daily. Notes to patient: Last dose given 04/27/2020 at 08:24am   metFORMIN 500 MG tablet Commonly known as: GLUCOPHAGE Take 500 mg by mouth 2 (two) times daily with a meal. Notes to patient: Not given while in hospital.    nitroGLYCERIN 0.4 MG SL tablet Commonly known as: NITROSTAT Place 1 tablet (0.4 mg total) under the tongue every 5 (five) minutes as needed for chest pain. Notes to patient: Not given while in hospital.    Plavix 75 MG tablet Generic drug: clopidogrel Take 1 tablet (75 mg total) by mouth daily. Notes to patient: Not given while in hospital.     sucralfate 1 g tablet Commonly known as: Carafate Take 1 tablet (1 g total) by mouth 4 (four) times daily -  with meals and at bedtime. Notes to patient: Not given while in hospital.        Follow-up Information    Revelo, Elyse Jarvis, MD On 05/02/2020.   Specialty: Family Medicine Why: @ 11:20 am Contact information: [address] [place] [address] [phone]              Allergies  Allergen Reactions  . Strawberry Extract Hives and Swelling  . Strawberry Flavor Rash    Consultations:     Procedures/Studies: DG Chest 2 View  Result Date: 04/25/2020 CLINICAL DATA:  LEFT side chest pain for 1 day radiating down LEFT arm, dizziness, lightheadedness, slight shortness of breath, former smoker, coronary artery disease post stenting, hypertension, GERD EXAM: CHEST - 2 VIEW COMPARISON:  04/09/2019 FINDINGS: Normal heart size, mediastinal contours,  and pulmonary vascularity. Coronary stent noted. Minimal chronic peribronchial thickening. No pulmonary infiltrate, pleural effusion, or pneumothorax. Bones unremarkable. IMPRESSION: Minimal chronic bronchitic changes without infiltrate. Electronically Signed   By: Lavonia Dana M.D.   On: 04/25/2020 09:17   CT ABDOMEN PELVIS W CONTRAST  Result Date: 04/26/2020 CLINICAL DATA:  Abdominal pain EXAM: CT ABDOMEN AND PELVIS WITH CONTRAST TECHNIQUE: Multidetector CT imaging of the abdomen and pelvis was performed using the standard protocol following bolus administration of intravenous contrast. CONTRAST:  153mL OMNIPAQUE IOHEXOL 300 MG/ML  SOLN COMPARISON:  04/09/2019 FINDINGS: Lower chest: No acute abnormality. Hepatobiliary: No focal liver abnormality is seen. No gallstones, gallbladder wall thickening, or biliary dilatation. Pancreas: Unremarkable. Spleen: Unremarkable. Adrenals/Urinary Tract: Punctate bilateral nonobstructing renal calculi. Adrenals are unremarkable. Partially distended bladder is unremarkable. Stomach/Bowel: Stomach is within  normal limits. Bowel is normal in caliber. Distal colonic diverticulosis. Normal appendix. Vascular/Lymphatic: Infrarenal aortoiliac stent graft. Aortic atherosclerosis. No enlarged lymph nodes identified. Reproductive: Uterus and bilateral adnexa are unremarkable. Other: Small fat containing paraumbilical hernia.  No ascites. Musculoskeletal: No significant or acute osseous abnormality. IMPRESSION: No acute abnormality or findings to account for reported symptoms. Stable chronic findings detailed above. Electronically Signed   By: Macy Mis M.D.   On: 04/26/2020 20:43       Subjective: Feels well.  Tolerating her p.o. intake.  No nausea vomiting or any other symptoms.  Her hand [place] area improved.  Discharge Exam: Vitals:   04/27/20 0332 04/27/20 0809  BP: 131/85 111/85  Pulse: 89 90  Resp: 18 16  Temp: 98.1 F (36.7 C) 98.5 F (36.9 C)  SpO2: 97% 100%    Vitals:   04/26/20 2318 04/27/20 0018 04/27/20 0332 04/27/20 0809  BP:  135/79 131/85 111/85  Pulse:  99 89 90  Resp:  20 18 16   Temp: 98.3 F (36.8 C) 98.6 F (37 C) 98.1 F (36.7 C) 98.5 F (36.9 C)  TempSrc: Oral Oral Oral Oral  SpO2:  97% 97% 100%  Weight:      Height:        General: Pt is alert, awake, not in acute distress Cardiovascular: RRR, S1/S2 +, no rubs, no gallops Respiratory: CTA bilaterally, no wheezing, no rhonchi Abdominal: Soft, NT, ND, bowel sounds + Extremities: no edema, no cyanosis    The results of significant diagnostics from this hospitalization (including imaging, microbiology, ancillary and laboratory) are listed below for reference.     Microbiology: Recent Results (from the past 240 hour(s))  SARS Coronavirus 2 by RT PCR (hospital order, performed in [facility] hospital lab) Nasopharyngeal     Status: None   Collection Time: 04/26/20  9:38 PM   Specimen: Nasopharyngeal  Result Value Ref Range Status   SARS Coronavirus 2 NEGATIVE NEGATIVE Final    Comment: (NOTE) SARS-CoV-2 target nucleic acids are NOT DETECTED.  The SARS-CoV-2 RNA is generally detectable in upper and lower respiratory specimens during the acute phase of infection. The lowest concentration of SARS-CoV-2 viral copies this assay can detect is 250 copies / mL. A negative result does not preclude SARS-CoV-2 infection and should not be used as the sole basis for treatment or other patient management decisions.  A negative result may occur with improper specimen collection / handling, submission of specimen other than nasopharyngeal swab, presence of viral mutation(s) within the areas targeted by this assay, and inadequate number of viral copies (<250 copies / mL). A negative result must be combined with clinical observations, patient history, and epidemiological information.  Fact Sheet for Patients:   StrictlyIdeas.no  Fact Sheet for  Healthcare Providers: BankingDealers.co.za  This test is not yet approved or  cleared by the [place] FDA and has been authorized for detection and/or diagnosis of SARS-CoV-2 by FDA under an Emergency Use Authorization (EUA).  This EUA will remain in effect (meaning this test can be used) for the duration of the COVID-19 declaration under Section 564(b)(1) of the Act, 21 U.S.C. section 360bbb-3(b)(1), unless the authorization is terminated or revoked sooner.  Performed at [facility], [address] [place], [place] [address]      Labs: BNP (last 3 results) No results for input(s): BNP in the last 8760 hours. Basic Metabolic Panel: Recent Labs  Lab 04/25/20 0851 04/26/20 1546 04/27/20 0432  NA 137 137 139  K 3.9 3.9 4.1  CL 104 102 106  CO2 24 21* 23  GLUCOSE 120* 192* 154*  BUN 16 17 15   CREATININE 0.93 1.08* 0.86  CALCIUM 9.1 9.0 8.5*   Liver Function Tests: Recent Labs  Lab 04/26/20 1546  AST 21  ALT 23  ALKPHOS 93  BILITOT 1.1  PROT 8.1  ALBUMIN 4.3   Recent Labs  Lab 04/26/20 1546  LIPASE 22   No results for input(s): AMMONIA in the last 168 hours. CBC: Recent Labs  Lab 04/25/20 0851 04/26/20 1546 04/27/20 0432  WBC 12.8* 15.8* 10.4  HGB 11.9* 12.9 11.4*  HCT 35.7* 37.3 35.3*  MCV 81.7 79.7* 83.8  PLT 421* 447* 413*   Cardiac Enzymes: No results for input(s): CKTOTAL, CKMB, CKMBINDEX, TROPONINI in the last 168 hours. BNP: Invalid input(s): POCBNP CBG: No results for input(s): GLUCAP in the last 168 hours. D-Dimer No results for input(s): DDIMER in the last 72 hours. Hgb A1c No results for input(s): HGBA1C in the last 72 hours. Lipid Profile No results for input(s): CHOL, HDL, LDLCALC, TRIG, CHOLHDL, LDLDIRECT in the last 72 hours. Thyroid function studies No results for input(s): TSH, T4TOTAL, T3FREE, THYROIDAB in the last 72 hours.  Invalid input(s): FREET3 Anemia work up No results for  input(s): VITAMINB12, FOLATE, FERRITIN, TIBC, IRON, RETICCTPCT in the last 72 hours. Urinalysis    Component Value Date/Time   COLORURINE STRAW (A) 04/26/2020 1546   APPEARANCEUR CLEAR (A) 04/26/2020 1546   LABSPEC >1.046 (H) 04/26/2020 1546   PHURINE 5.0 04/26/2020 1546   GLUCOSEU >=500 (A) 04/26/2020 1546   HGBUR NEGATIVE 04/26/2020 1546   BILIRUBINUR NEGATIVE 04/26/2020 1546   KETONESUR NEGATIVE 04/26/2020 1546   PROTEINUR NEGATIVE 04/26/2020 1546   NITRITE NEGATIVE 04/26/2020 1546   LEUKOCYTESUR NEGATIVE 04/26/2020 1546   Sepsis Labs Invalid input(s): PROCALCITONIN,  WBC,  LACTICIDVEN Microbiology Recent Results (from the past 240 hour(s))  SARS Coronavirus 2 by RT PCR (hospital order, performed in [place] hospital lab) Nasopharyngeal     Status: None   Collection Time: 04/26/20  9:38 PM   Specimen: Nasopharyngeal  Result Value Ref Range Status   SARS Coronavirus 2 NEGATIVE NEGATIVE Final    Comment: (NOTE) SARS-CoV-2 target nucleic acids are NOT DETECTED.  The SARS-CoV-2 RNA is generally detectable in upper and lower respiratory specimens during the acute phase of infection. The lowest concentration of SARS-CoV-2 viral copies this assay can detect is 250 copies / mL. A negative result does not preclude SARS-CoV-2 infection and should not be used as the sole basis for treatment or other patient management decisions.  A negative result may occur with improper specimen collection / handling, submission of specimen other than nasopharyngeal swab, presence of viral mutation(s) within the areas targeted by this assay, and inadequate number of viral copies (<250 copies / mL). A negative result must be combined with clinical observations, patient history, and epidemiological information.  Fact Sheet for Patients:   StrictlyIdeas.no  Fact Sheet for Healthcare Providers: BankingDealers.co.za  This test is not yet approved or   cleared by the [place] FDA and has been authorized for detection and/or diagnosis of SARS-CoV-2 by FDA under an Emergency Use Authorization (EUA).  This EUA will remain in effect (meaning this test can be used) for the duration of the COVID-19 declaration under Section 564(b)(1) of the Act, 21 U.S.C. section 360bbb-3(b)(1), unless the authorization is terminated or revoked sooner.  Performed at [facility], [address] [place],  [place] [address]      Time coordinating discharge: Over 30 minutes  SIGNED:   Nolberto Hanlon, MD  [facility] 04/27/2020, 5:08 PM Pager   If 7PM-7AM, please contact night-coverage [email or web address] Password [email or web address]

## 2020-07-05 ENCOUNTER — Emergency Department
Admission: EM | Admit: 2020-07-05 | Discharge: 2020-07-05 | Disposition: A | Discharge status: Home / Self Care | Payer: Self-pay | Attending: Emergency Medicine | Admitting: Emergency Medicine

## 2020-07-05 ENCOUNTER — Other Ambulatory Visit: Payer: Self-pay

## 2020-07-05 ENCOUNTER — Emergency Department: Payer: Self-pay

## 2020-07-05 ENCOUNTER — Encounter: Payer: Self-pay | Admitting: Emergency Medicine

## 2020-07-05 DIAGNOSIS — I10 Essential (primary) hypertension: Secondary | ICD-10-CM | POA: Insufficient documentation

## 2020-07-05 DIAGNOSIS — Z7984 Long term (current) use of oral hypoglycemic drugs: Secondary | ICD-10-CM | POA: Insufficient documentation

## 2020-07-05 DIAGNOSIS — Z955 Presence of coronary angioplasty implant and graft: Secondary | ICD-10-CM | POA: Insufficient documentation

## 2020-07-05 DIAGNOSIS — I2510 Atherosclerotic heart disease of native coronary artery without angina pectoris: Secondary | ICD-10-CM | POA: Insufficient documentation

## 2020-07-05 DIAGNOSIS — R0789 Other chest pain: Secondary | ICD-10-CM | POA: Insufficient documentation

## 2020-07-05 DIAGNOSIS — Z79899 Other long term (current) drug therapy: Secondary | ICD-10-CM | POA: Insufficient documentation

## 2020-07-05 DIAGNOSIS — Z87891 Personal history of nicotine dependence: Secondary | ICD-10-CM | POA: Insufficient documentation

## 2020-07-05 DIAGNOSIS — Z7982 Long term (current) use of aspirin: Secondary | ICD-10-CM | POA: Insufficient documentation

## 2020-07-05 DIAGNOSIS — E119 Type 2 diabetes mellitus without complications: Secondary | ICD-10-CM | POA: Insufficient documentation

## 2020-07-05 LAB — TROPONIN I (HIGH SENSITIVITY)
Troponin I (High Sensitivity): 7 ng/L (ref <18)
Troponin I (High Sensitivity): 8 ng/L (ref <18)

## 2020-07-05 LAB — BASIC METABOLIC PANEL
Anion gap: 10 (ref 5–15)
BUN: 16 mg/dL (ref 6–20)
CO2: 26 mmol/L (ref 22–32)
Calcium: 9.3 mg/dL (ref 8.9–10.3)
Chloride: 103 mmol/L (ref 98–111)
Creatinine, Ser: 0.86 mg/dL (ref 0.44–1.00)
GFR calc Af Amer: >60 mL/min (ref >60)
GFR calc non Af Amer: >60 mL/min (ref >60)
Glucose, Bld: 104 mg/dL — ABNORMAL HIGH (ref 70–99)
Potassium: 3.8 mmol/L (ref 3.5–5.1)
Sodium: 139 mmol/L (ref 135–145)

## 2020-07-05 LAB — CBC
HCT: 36.7 % (ref 36.0–46.0)
Hemoglobin: 11.7 g/dL — ABNORMAL LOW (ref 12.0–15.0)
MCH: 27.2 pg (ref 26.0–34.0)
MCHC: 31.9 g/dL (ref 30.0–36.0)
MCV: 85.3 fL (ref 80.0–100.0)
Platelets: 469 10*3/uL — ABNORMAL HIGH (ref 150–400)
RBC: 4.3 MIL/uL (ref 3.87–5.11)
RDW: 15.7 % — ABNORMAL HIGH (ref 11.5–15.5)
WBC: 10.5 10*3/uL (ref 4.0–10.5)
nRBC: 0 % (ref 0.0–0.2)

## 2020-07-05 NOTE — ED Notes (Signed)
Pt given graham crackers, peanut butter and grape juice per request

## 2020-07-05 NOTE — ED Notes (Signed)
See triage note  Presents with some chest discomfort to right upper chest  And her b/p was elevated

## 2020-07-05 NOTE — Discharge Instructions (Signed)
You were seen in the ED because of your chest pain.  As we discussed, there is no evidence of strain or damage in your heart. This is more likely to represent your acid reflux or a reaction to the multiple stressors ongoing in your life right now.  Please continue to take all of your regular medications.  If you develop any worsening chest pains, especially combination with fevers, passing out or throwing up, please return to the ED.

## 2020-07-05 NOTE — ED Triage Notes (Signed)
Presents with chest pain this am  States the pain was right upper chest area

## 2020-07-05 NOTE — ED Notes (Signed)
Sent green and purple tubes to lab. 

## 2020-07-05 NOTE — ED Provider Notes (Signed)
[facility] Emergency Department Provider Note ____________________________________________   First MD Initiated Contact with Patient 07/05/20 1210     (approximate)  I have reviewed the triage vital signs and the nursing notes.  HISTORY  Chief Complaint Chest Pain   HPI Heather Mahoney is a 54 y.o. femalewho presents to the ED for evaluation of chest pain.   Chart review indicates hx CAD, HTN, HLD and DM on Metformin.  DES x2 to RCA in March 2020.  AAA with endovascular repair 2019.  Follows with Dr. Rockey Situ with cardiology.  Patient reports intermittent right-sided chest pain over the past 12 hours.  She reports waking from sleep to void, noticing some "occasional twinges" but is able to fall back asleep quickly.  She reports waking up multiple times this morning with continued symptoms of intermittent right-sided pain.  Reports the pain comes on acutely, lasts a matter of seconds before self resolving.  She reports the pain is aching in nature, nonradiating and up to 8/10 intensity.  She reports taking her home medications this morning.  She denies any associated symptoms with the pain, including nausea, vomiting, shortness of breath, syncope, headache, abdominal pain.  She elaborates on multiple psychosocial stressors including a sick grandchild of unknown etiology and multiple recent deaths in the family.  She denies any pain in her chest right now when I evaluate her and reports feeling okay, eager to go home from my initial conversation.  Past Medical History:  Diagnosis Date  . CAD (coronary artery disease)    a. PCI to proximal and distal RCA 3/26  . GERD (gastroesophageal reflux disease)   . Heart murmur    MR  . Hiatal hernia   . Hyperlipidemia   . Hypertension   . Tobacco abuse    a. Started at age 89, quit during 3 pregnancies. b. 1 PPD for a 40 pack-year hx (2019)     Patient Active Problem List   Diagnosis Date Noted  . Acute  gastroenteritis 04/26/2020  . History of endovascular stent graft for abdominal aortic aneurysm 04/26/2020  . Urticaria 04/26/2020  . Diabetes ([place]) 07/05/2019  . Precordial chest pain   . Chest pain 04/09/2019  . Atherosclerosis of native arteries of extremity with intermittent claudication ([place]) 04/01/2019  . Coronary artery disease of native artery of native heart with stable angina pectoris ([place]) 01/26/2019  . CAD (coronary artery disease) 01/14/2019  . Unstable angina ([place]) 01/13/2019  . PAD (peripheral artery disease) ([place]) 08/30/2018  . AAA (abdominal aortic aneurysm) ([place]) 08/10/2018  . Chest pain with moderate risk for cardiac etiology 12/18/2017  . 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 PVC (premature ventricular contraction) 02/12/2016  . Essential hypertension, benign 02/12/2016  . Tobacco use disorder 02/12/2016  . Migraine headache 01/09/2016  . Lymphocytosis 12/21/2015  . Hypertension 12/12/2015  . Mixed hyperlipidemia 12/12/2015  . Vitamin D deficiency 12/12/2015  . Mitral regurgitation 12/12/2015  . Hypertensive urgency 11/29/2015  . Abdominal pulsatile mass 11/29/2015    Past Surgical History:  Procedure Laterality Date  . BREAST BIOPSY Right    Multiple biopsies-all benign  . BREAST SURGERY  1986   I&D for 'milk duct'  . CORONARY STENT INTERVENTION N/A 01/13/2019   Procedure: CORONARY STENT INTERVENTION;  Surgeon: Nelva Bush, MD;  Location: [place] CV LAB;  Service: Cardiovascular;  Laterality: N/A;  . EMBOLECTOMY  08/13/2018   Procedure: Right SFA and profunda femoris Fogarty embolectomy 3  Fogarty embolectomy balloon;  Surgeon: Algernon Huxley,  MD;  Location: [facility] ORS;  Service: Vascular;;  . ENDARTERECTOMY FEMORAL Right 08/13/2018   Procedure: Right common femoral, profunda femoris, and superficial femoral artery endarterectomies and patch angioplasty ;  Surgeon: Algernon Huxley, MD;  Location: [facility] ORS;  Service: Vascular;  Laterality: Right;  . ENDOVASCULAR  REPAIR/STENT GRAFT N/A 08/13/2018   Procedure: ENDOVASCULAR REPAIR/STENT GRAFT;  Surgeon: Katha Cabal, MD;  Location: [place] CV LAB;  Service: Cardiovascular;  Laterality: N/A;  . LEFT HEART CATH AND CORONARY ANGIOGRAPHY Left 01/13/2019   Procedure: LEFT HEART CATH AND CORONARY ANGIOGRAPHY;  Surgeon: Minna Merritts, MD;  Location: [place] CV LAB;  Service: Cardiovascular;  Laterality: Left;  . SALPINGOOPHORECTOMY Left 2000    Prior to Admission medications   Medication Sig Start Date End Date Taking? Authorizing Provider  aspirin 81 MG EC tablet Take 1 tablet (81 mg total) by mouth daily. 01/14/19   Marrianne Mood D, PA-C  cholecalciferol (VITAMIN D3) 25 MCG (1000 UNIT) tablet Take 1,000 Units by mouth 2 (two) times daily.    [provider]  docusate sodium (COLACE) 100 MG capsule Take 1 capsule (100 mg total) by mouth daily. 08/16/18   Vaughan Basta, MD  ezetimibe (ZETIA) 10 MG tablet Take 1 tablet (10 mg total) by mouth daily. 03/28/20   Minna Merritts, MD  LIPITOR 80 MG tablet Take 1 tablet (80 mg total) by mouth daily. 10/26/19   Minna Merritts, MD  metFORMIN (GLUCOPHAGE) 500 MG tablet Take 500 mg by mouth 2 (two) times daily with a meal.    [provider]  nitroGLYCERIN (NITROSTAT) 0.4 MG SL tablet Place 1 tablet (0.4 mg total) under the tongue every 5 (five) minutes as needed for chest pain. 01/26/19 03/28/20  Minna Merritts, MD  PLAVIX 75 MG tablet Take 1 tablet (75 mg total) by mouth daily. 10/26/19   Minna Merritts, MD  sucralfate (CARAFATE) 1 g tablet Take 1 tablet (1 g total) by mouth 4 (four) times daily -  with meals and at bedtime. 04/25/20   Lavonia Drafts, MD    Allergies Strawberry extract and Strawberry flavor  Family History  Problem Relation Age of Onset  . Diabetes Mother   . Hypertension Mother   . Hyperlipidemia Mother   . Heart disease Mother        CABG X 4  . Asthma Mother   . Sarcoidosis Mother        In  remission  . Heart attack Mother   . Diabetes Maternal Grandmother   . Heart disease Maternal Grandmother   . Hyperlipidemia Maternal Grandmother     Social History Social History   Tobacco Use  . Smoking status: Former Smoker    Packs/day: 0.25    Years: 38.00    Pack years: 9.50    Types: Cigarettes    Quit date: 08/10/2018    Years since quitting: 1.9  . Smokeless tobacco: Never Used  Vaping Use  . Vaping Use: Never used  Substance Use Topics  . Alcohol use: Yes    Comment: occassional  . Drug use: Yes    Types: Marijuana    Comment: smokes Marijuana 'when I cant sleep, maybe once or twice a week'    Review of Systems  Constitutional: No fever/chills Eyes: No visual changes. ENT: No sore throat. Cardiovascular: Positive for chest pain. Respiratory: Denies shortness of breath. Gastrointestinal: No abdominal pain.  No nausea, no vomiting.  No diarrhea.  No constipation. Genitourinary:  Negative for dysuria. Musculoskeletal: Negative for back pain. Skin: Negative for rash. Neurological: Negative for headaches, focal weakness or numbness.   ____________________________________________   PHYSICAL EXAM:  VITAL SIGNS: Vitals:   07/05/20 0835 07/05/20 1044  BP: (!) 177/118 (!) 188/128  Pulse: 91 78  Resp: 18 18  Temp: 98.3 F (36.8 C) 98.8 F (37.1 C)  SpO2: 96% 100%      Constitutional: Alert and oriented. Well appearing and in no acute distress. Eyes: Conjunctivae are normal. PERRL. EOMI. Head: Atraumatic. Nose: No congestion/rhinnorhea. Mouth/Throat: Mucous membranes are moist.  Oropharynx non-erythematous. Neck: No stridor. No cervical spine tenderness to palpation. Cardiovascular: Normal rate, regular rhythm. Grossly normal heart sounds.  Good peripheral circulation. Respiratory: Normal respiratory effort.  No retractions. Lungs CTAB. Gastrointestinal: Soft , nondistended, nontender to palpation. No abdominal bruits. No CVA  tenderness. Musculoskeletal: No lower extremity tenderness nor edema.  No joint effusions. No signs of acute trauma. Neurologic:  Normal speech and language. No gross focal neurologic deficits are appreciated. No gait instability noted. Cranial nerves II through XII intact 5/5 strength and sensation in all 4 extremities Ambulatory independently with a normal gait. Skin:  Skin is warm, dry and intact. No rash noted. Psychiatric: Mood and affect are normal. Speech and behavior are normal.  ____________________________________________   LABS (all labs ordered are listed, but only abnormal results are displayed)  Labs Reviewed  BASIC METABOLIC PANEL - Abnormal; Notable for the following components:      Result Value   Glucose, Bld 104 (*)    All other components within normal limits  CBC - Abnormal; Notable for the following components:   Hemoglobin 11.7 (*)    RDW 15.7 (*)    Platelets 469 (*)    All other components within normal limits  POC URINE PREG, ED  TROPONIN I (HIGH SENSITIVITY)  TROPONIN I (HIGH SENSITIVITY)   ____________________________________________  12 Lead EKG  Sinus rhythm, rate of 81 bpm.  Normal axis and intervals. no evidence of acute ischemia. ____________________________________________  RADIOLOGY  ED MD interpretation: 2 view CXR reviewed without evidence of acute cardiopulmonary pathology  Official radiology report(s): DG Chest 2 View  Result Date: 07/05/2020 CLINICAL DATA:  Chest pain EXAM: CHEST - 2 VIEW COMPARISON:  April 25, 2020. FINDINGS: There is no edema or airspace opacity. The heart size and pulmonary vascularity are normal. No adenopathy. No bone lesions. IMPRESSION: No edema or airspace opacity. Cardiac silhouette within normal limits. Electronically Signed   By: Lowella Grip III M.D.   On: 07/05/2020 09:16    ____________________________________________   PROCEDURES and INTERVENTIONS  Procedure(s) performed (including Critical  Care):  Procedures  Medications - No data to display  ____________________________________________   MDM / ED COURSE  54 year old woman with history of CAD presents with atypical right-sided chest pain without evidence of ACS and amenable to outpatient management.  Patient persistently and mildly hypertensive, likely at her baseline, and vitals otherwise normal.  Exam without evidence of acute pathology.  She is well-appearing without chest pain at this time, without distress or evidence of neurovascular deficits.  No signs of trauma or skin changes to suggest herpes zoster.  Blood work is reassuring with negative troponin x2 and nonischemic EKG.  CXR without infiltrates or PTX.  Patient continues to be chest pain-free here in the ED and I see no evidence of significant acute pathology.  Advised patient follow-up with her cardiologist and PCP within the next week to discuss her continued symptoms.  We  discussed outpatient management and return precautions for the ED.  Patient medically stable for discharge home.  Clinical Course as of Jul 05 1334  Thu Jul 05, 2020  1325 Educated patient on reassuring work-up and negative repeat troponin.  She is eager to go home.  Requesting work note.   [DS]    Clinical Course User Index [DS] Vladimir Crofts, MD     ____________________________________________   FINAL CLINICAL IMPRESSION(S) / ED DIAGNOSES  Final diagnoses:  Other chest pain  Essential hypertension     ED Discharge Orders    None       Jameyah Fennewald Tamala Julian   Note:  This document was prepared using Dragon voice recognition software and may include unintentional dictation errors.   Vladimir Crofts, MD 07/05/20 [phone]

## 2020-07-06 NOTE — Progress Notes (Signed)
Date:  07/09/2020   ID:  Heather Mahoney, Kem 12/31/1965, MRN [id number]  Patient Location:  [address] [place] [address]   Provider location:   [facility], [place] office  PCP:  Alene Mires Elyse Jarvis, MD  Cardiologist:  Arvid Right [facility]  Chief Complaint  Patient presents with  . OTHER    Chest pain. Meds reviewed verbally with pt.    History of Present Illness:    Heather Mahoney is a 54 y.o. female  past medical history of Smoker GERD HTN PVCs Fall and question of loss of consciousness 2017, secondary to orthostasis,  AAA with endovascular repair October 2019, access site on the right groin CAD, stent x2 to RCA March 2020, Access site left groin Who presents today for follow-up of her chest pain/angina, shortness of breath, CAD, results of her recent cardiac CT  Works at [employer], watches monitors   Stopped smoking 2 years ago  Having severe GERD sx  Having claudication pain, Followed by Dr. dew [place] if it could be neuropathy Difficulty walking secondary to chronic pain in her feet Reports Lipitor did not make her pain worse even after holding the medication for some time  Was living in an apartment, now moved to a house, has her own car Extended family stay with her  Denies chest pain concerning for angina  EKG personally reviewed by myself on todays visit Shows normal sinus rhythm rate 72 bpm no significant ST or T wave changes  Lab work reviewed, cholesterol numbers above goal  Other past medical history reviewed cardiac CTA showing stenosis in RCA by FFR   cardiac catheterization January 13, 2019  Cardiac cath 01/13/2019 Single-vessel disease notably of the RCA moderate to severe proximal  disease estimated 75% and distal RCA disease estimated at 80% There is disease of high OM vessel, small in caliber diffusely diseased Mild proximal LAD disease  --- stenting of the proximal RCA and distal RCA  subsequent left  groin hematoma causing hypotension, vagal response Was kept for 2 days post procedure in the hospital Her  Medication intolerances  isosorbide as this caused a headache   peripheral vascular catheterization in October 2019  AAA repair, endograft  She reports strong family history of coronary disease Mother with bypass surgery Her grandmother, mother side, passed away at the age of 40 from a brain aneurysm.    Prior CV studies:   The following studies were reviewed today:  Cardiac CTA cardiac CT scan was ordered showing severe stenosis mid RCA (FFR less than 0.7), indeterminate stenosis LAD and circumflex (FFR less than 0.8)   Past Medical History:  Diagnosis Date  . CAD (coronary artery disease)    a. PCI to proximal and distal RCA 3/26  . GERD (gastroesophageal reflux disease)   . Heart murmur    MR  . Hiatal hernia   . Hyperlipidemia   . Hypertension   . Tobacco abuse    a. Started at age 18, quit during 3 pregnancies. b. 1 PPD for a 40 pack-year hx (2019)    Past Surgical History:  Procedure Laterality Date  . BREAST BIOPSY Right    Multiple biopsies-all benign  . BREAST SURGERY  1986   I&D for 'milk duct'  . CORONARY STENT INTERVENTION N/A 01/13/2019   Procedure: CORONARY STENT INTERVENTION;  Surgeon: Nelva Bush, MD;  Location: [place] CV LAB;  Service: Cardiovascular;  Laterality: N/A;  . EMBOLECTOMY  08/13/2018  Procedure: Right SFA and profunda femoris Fogarty embolectomy 3  Fogarty embolectomy balloon;  Surgeon: Algernon Huxley, MD;  Location: [facility] ORS;  Service: Vascular;;  . ENDARTERECTOMY FEMORAL Right 08/13/2018   Procedure: Right common femoral, profunda femoris, and superficial femoral artery endarterectomies and patch angioplasty ;  Surgeon: Algernon Huxley, MD;  Location: [facility] ORS;  Service: Vascular;  Laterality: Right;  . ENDOVASCULAR REPAIR/STENT GRAFT N/A 08/13/2018   Procedure: ENDOVASCULAR REPAIR/STENT GRAFT;  Surgeon: Katha Cabal,  MD;  Location: [place] CV LAB;  Service: Cardiovascular;  Laterality: N/A;  . LEFT HEART CATH AND CORONARY ANGIOGRAPHY Left 01/13/2019   Procedure: LEFT HEART CATH AND CORONARY ANGIOGRAPHY;  Surgeon: Minna Merritts, MD;  Location: [place] CV LAB;  Service: Cardiovascular;  Laterality: Left;  . SALPINGOOPHORECTOMY Left 2000     Current Meds  Medication Sig  . amLODipine (NORVASC) 10 MG tablet Take 10 mg by mouth daily.  Marland Kitchen aspirin 81 MG EC tablet Take 1 tablet (81 mg total) by mouth daily.  . cholecalciferol (VITAMIN D3) 25 MCG (1000 UNIT) tablet Take 1,000 Units by mouth 2 (two) times daily.  . cloNIDine (CATAPRES) 0.1 MG tablet Take 0.1 mg by mouth daily.  Marland Kitchen docusate sodium (COLACE) 100 MG capsule Take 1 capsule (100 mg total) by mouth daily.  . empagliflozin (JARDIANCE) 10 MG TABS tablet Take by mouth daily.  Marland Kitchen ezetimibe (ZETIA) 10 MG tablet Take 1 tablet (10 mg total) by mouth daily.  . famotidine (PEPCID) 40 MG tablet Take by mouth daily.  . hydrOXYzine (ATARAX/VISTARIL) 25 MG tablet Take 25 mg by mouth every 6 (six) hours as needed.  Marland Kitchen LIPITOR 80 MG tablet Take 1 tablet (80 mg total) by mouth daily.  Marland Kitchen losartan (COZAAR) 100 MG tablet Take 100 mg by mouth daily.  . metoprolol succinate (TOPROL-XL) 50 MG 24 hr tablet Take 50 mg by mouth daily. Take with or immediately following a meal.  . nitroGLYCERIN (NITROSTAT) 0.4 MG SL tablet Place 1 tablet (0.4 mg total) under the tongue every 5 (five) minutes as needed for chest pain.  Marland Kitchen PLAVIX 75 MG tablet Take 1 tablet (75 mg total) by mouth daily.  . sucralfate (CARAFATE) 1 g tablet Take 1 tablet (1 g total) by mouth 4 (four) times daily -  with meals and at bedtime.  . [DISCONTINUED] metFORMIN (GLUCOPHAGE) 500 MG tablet Take 500 mg by mouth 2 (two) times daily with a meal.     Allergies:   Strawberry extract and Strawberry flavor   Social History   Tobacco Use  . Smoking status: Former Smoker    Packs/day: 0.25    Years:  38.00    Pack years: 9.50    Types: Cigarettes    Quit date: 08/10/2018    Years since quitting: 1.9  . Smokeless tobacco: Never Used  Vaping Use  . Vaping Use: Never used  Substance Use Topics  . Alcohol use: Yes    Comment: occassional  . Drug use: Yes    Types: Marijuana    Comment: smokes Marijuana 'when I cant sleep, maybe once or twice a week'      Family Hx: The patient's family history includes Asthma in her mother; Diabetes in her maternal grandmother and mother; Heart attack in her mother; Heart disease in her maternal grandmother and mother; Hyperlipidemia in her maternal grandmother and mother; Hypertension in her mother; Sarcoidosis in her mother.  ROS:   Please see the history of present illness.  Review of Systems  Constitutional: Negative.   HENT: Negative.   Respiratory: Negative.   Cardiovascular: Negative.   Gastrointestinal: Negative.   Musculoskeletal: Negative.   Neurological: Negative.   Psychiatric/Behavioral: Negative.   All other systems reviewed and are negative.    Labs/Other Tests and Data Reviewed:    Recent Labs: 04/26/2020: ALT 23 07/05/2020: BUN 16; Creatinine, Ser 0.86; Hemoglobin 11.7; Platelets 469; Potassium 3.8; Sodium 139   Recent Lipid Panel Lab Results  Component Value Date/Time   CHOL 200 04/10/2019 05:01 AM   CHOL 266 (H) 12/06/2018 10:17 AM   TRIG 82 04/10/2019 05:01 AM   HDL 55 04/10/2019 05:01 AM   HDL 64 12/06/2018 10:17 AM   CHOLHDL 3.6 04/10/2019 05:01 AM   LDLCALC 129 (H) 04/10/2019 05:01 AM   LDLCALC 178 (H) 12/06/2018 10:17 AM   LDLDIRECT 185 (H) 12/06/2018 10:17 AM    Wt Readings from Last 3 Encounters:  07/09/20 171 lb 8 oz (77.8 kg)  04/26/20 174 lb (78.9 kg)  04/25/20 173 lb 15.1 oz (78.9 kg)     Exam:   BP 140/90 (BP Location: Left Arm, Patient Position: Sitting, Cuff Size: Normal)   Pulse 72   Ht 5\' 6"  (1.676 m)   Wt 171 lb 8 oz (77.8 kg)   SpO2 98%   BMI 27.68 kg/m  Constitutional:  oriented  to person, place, and time. No distress.  HENT:  Head: Grossly normal Eyes:  no discharge. No scleral icterus.  Neck: No JVD, no carotid bruits  Cardiovascular: Regular rate and rhythm, no murmurs appreciated Pulmonary/Chest: Clear to auscultation bilaterally, no wheezes or rails Abdominal: Soft.  no distension.  no tenderness.  Musculoskeletal: Normal range of motion Neurological:  normal muscle tone. Coordination normal. No atrophy Skin: Skin warm and dry Psychiatric: normal affect, pleasant   ASSESSMENT & PLAN:    Coronary artery disease of native artery of native heart with stable angina pectoris ([place])  stent placement x2,  Currently with no symptoms of angina. No further workup at this time. Continue current medication regimen.  PAD (peripheral artery disease) ([place]) AAA with endograft placement  Followed by Dr. Lucky Cowboy  Abdominal aortic aneurysm (AAA) without rupture ([place]) Monitored by Dr. Lucky Cowboy  Essential hypertension, benign Blood pressure is well controlled on today's visit. No changes made to the medications.  Mixed hyperlipidemia On lipitor and zetia  Tobacco use disorder Stopped 2 years ago     Total encounter time more than 25 minutes  Greater than 50% was spent in counseling and coordination of care with the patient   Signed, Ida Rogue, MD  07/09/2020 4:55 PM    [place] Office [address] [place] [address], [place], [place] [address]

## 2020-07-09 ENCOUNTER — Other Ambulatory Visit
Admission: RE | Admit: 2020-07-09 | Discharge: 2020-07-09 | Disposition: A | Discharge status: Home / Self Care | Payer: Self-pay | Attending: Cardiovascular Disease | Admitting: Cardiovascular Disease

## 2020-07-09 ENCOUNTER — Other Ambulatory Visit: Payer: Self-pay

## 2020-07-09 ENCOUNTER — Ambulatory Visit (INDEPENDENT_AMBULATORY_CARE_PROVIDER_SITE_OTHER): Payer: Self-pay | Admitting: Cardiovascular Disease

## 2020-07-09 ENCOUNTER — Encounter: Payer: Self-pay | Admitting: Cardiovascular Disease

## 2020-07-09 VITALS — BP 140/90 | HR 72 | Ht 66.0 in | Wt 171.5 lb

## 2020-07-09 DIAGNOSIS — I739 Peripheral vascular disease, unspecified: Secondary | ICD-10-CM

## 2020-07-09 DIAGNOSIS — I10 Essential (primary) hypertension: Secondary | ICD-10-CM

## 2020-07-09 DIAGNOSIS — I714 Abdominal aortic aneurysm, without rupture, unspecified: Secondary | ICD-10-CM

## 2020-07-09 DIAGNOSIS — I25118 Atherosclerotic heart disease of native coronary artery with other forms of angina pectoris: Secondary | ICD-10-CM | POA: Insufficient documentation

## 2020-07-09 DIAGNOSIS — E785 Hyperlipidemia, unspecified: Secondary | ICD-10-CM

## 2020-07-09 DIAGNOSIS — F17200 Nicotine dependence, unspecified, uncomplicated: Secondary | ICD-10-CM

## 2020-07-09 DIAGNOSIS — D649 Anemia, unspecified: Secondary | ICD-10-CM

## 2020-07-09 LAB — LIPID PANEL
Cholesterol: 236 mg/dL — ABNORMAL HIGH (ref 0–200)
HDL: 53 mg/dL (ref >40)
LDL Cholesterol: 149 mg/dL — ABNORMAL HIGH (ref 0–99)
Total CHOL/HDL Ratio: 4.5 RATIO
Triglycerides: 170 mg/dL — ABNORMAL HIGH (ref <150)
VLDL: 34 mg/dL (ref 0–40)

## 2020-07-09 MED ORDER — OMEPRAZOLE 20 MG PO CPDR: 20.0000 mg | DELAYED_RELEASE_CAPSULE | Freq: Two times a day (BID) | ORAL | 1 refills | Status: DC

## 2020-07-09 NOTE — Patient Instructions (Addendum)
Medication Instructions:  No changes  If you need a refill on your cardiac medications before your next appointment, please call your pharmacy.    Lab work: Lipids today in [place]   If you have labs (blood work) drawn today and your tests are completely normal, you will receive your results only by: Marland Kitchen MyChart Message (if you have MyChart) OR . A paper copy in the mail If you have any lab test that is abnormal or we need to change your treatment, we will call you to review the results.   Testing/Procedures: No new testing needed   Follow-Up: At [facility], you and your health needs are our priority.  As part of our continuing mission to provide you with exceptional heart care, we have created designated Provider Care Teams.  These Care Teams include your primary Cardiologist (physician) and Advanced Practice Providers (APPs -  Physician Assistants and Nurse Practitioners) who all work together to provide you with the care you need, when you need it.  . You will need a follow up appointment in 12 months  . Providers on your designated Care Team:   . Murray Hodgkins, NP . Christell Faith, PA-C . Marrianne Mood, PA-C  Any Other Special Instructions Will Be Listed Below (If Applicable).  COVID-19 Vaccine Information can be found at: ShippingScam.co.uk For questions related to vaccine distribution or appointments, please email [email or web address] .[email or web address] or call [phone].

## 2020-07-11 ENCOUNTER — Telehealth: Payer: Self-pay | Admitting: *Deleted

## 2020-07-11 NOTE — Telephone Encounter (Signed)
Spoke with patient and reviewed results and recommendations. She confirmed she is taking lipitor and zetia daily and was going to try and start eating better. They are focusing on baked chicken and fish. Reviewed importance of medication and diet changes in order to get these numbers down and she verbalized understanding with no further questions at this time.

## 2020-07-11 NOTE — Telephone Encounter (Signed)
-----   Message from Minna Merritts, MD sent at 07/11/2020  1:06 PM EDT ----- Cholesterol Numbers from July 09, 2020 are markedly elevated Would make sure she is on the Lipitor Zetia

## 2020-11-28 ENCOUNTER — Telehealth: Payer: Self-pay | Admitting: Cardiovascular Disease

## 2020-11-28 MED ORDER — LIPITOR 80 MG PO TABS: 80.0000 mg | ORAL_TABLET | Freq: Every day | ORAL | 6 refills | Status: DC

## 2020-11-28 MED ORDER — PLAVIX 75 MG PO TABS: 75.0000 mg | ORAL_TABLET | Freq: Every day | ORAL | 6 refills | Status: DC

## 2020-11-28 NOTE — Telephone Encounter (Signed)
*  STAT* If patient is at the pharmacy, call can be transferred to refill team.   1. Which medications need to be refilled? (please list name of each medication and dose if known) plavix and lipitor  2. Which pharmacy/location (including street and city if local pharmacy) is medication to be sent to? [facility] on [address]  3. Do they need a 30 day or 90 day supply? [place]

## 2020-11-28 NOTE — Telephone Encounter (Signed)
Requested Prescriptions   Signed Prescriptions Disp Refills   PLAVIX 75 MG tablet 30 tablet 6    Sig: Take 1 tablet (75 mg total) by mouth daily.    Authorizing Provider: Minna Merritts    Ordering User: NEWCOMER MCCLAIN, Tajon Moring L   LIPITOR 80 MG tablet 30 tablet 6    Sig: Take 1 tablet (80 mg total) by mouth daily.    Authorizing Provider: Minna Merritts    Ordering User: Raelene Bott, Kathyrn Warm[place] L

## 2020-12-16 ENCOUNTER — Other Ambulatory Visit: Payer: Self-pay | Admitting: Cardiovascular Disease

## 2021-01-04 ENCOUNTER — Ambulatory Visit (INDEPENDENT_AMBULATORY_CARE_PROVIDER_SITE_OTHER): Payer: BLUE CROSS/BLUE SHIELD

## 2021-01-04 ENCOUNTER — Other Ambulatory Visit: Payer: Self-pay

## 2021-01-04 ENCOUNTER — Ambulatory Visit (INDEPENDENT_AMBULATORY_CARE_PROVIDER_SITE_OTHER): Payer: BLUE CROSS/BLUE SHIELD | Admitting: Vascular Surgery

## 2021-01-04 ENCOUNTER — Encounter (INDEPENDENT_AMBULATORY_CARE_PROVIDER_SITE_OTHER): Payer: Self-pay | Admitting: Vascular Surgery

## 2021-01-04 VITALS — BP 206/108 | HR 76 | Resp 16 | Wt 176.6 lb

## 2021-01-04 DIAGNOSIS — I714 Abdominal aortic aneurysm, without rupture, unspecified: Secondary | ICD-10-CM

## 2021-01-04 DIAGNOSIS — I739 Peripheral vascular disease, unspecified: Secondary | ICD-10-CM | POA: Diagnosis not present

## 2021-01-04 DIAGNOSIS — E119 Type 2 diabetes mellitus without complications: Secondary | ICD-10-CM | POA: Diagnosis not present

## 2021-01-04 DIAGNOSIS — I10 Essential (primary) hypertension: Secondary | ICD-10-CM | POA: Diagnosis not present

## 2021-01-04 NOTE — Assessment & Plan Note (Signed)
ABIs today are 0.94 on the right and 1.01 on the left with biphasic waveforms.  Continue antiplatelet therapy and statin agent.  Recheck in 1 year.

## 2021-01-04 NOTE — Assessment & Plan Note (Signed)
Duplex shows a patent stent graft with no evidence of endoleak and a maximal sac diameter of less than 3 cm.  Doing well after endovascular repair.  Recheck in 1 year.

## 2021-01-04 NOTE — Progress Notes (Signed)
MRN : [id number]  Heather Mahoney is a 55 y.o. (1965-11-29) female who presents with chief complaint of  Chief Complaint  Patient presents with  . Follow-up    Ultrasound follow up  .  History of Present Illness: Patient returns today in follow up.  She is about 2-1/2 years status post endovascular abdominal aortic aneurysm repair and subsequent right SFA and profunda femoris embolectomy and endarterectomy.  She is doing well.  She remains smoke-free.  She has intentionally lost weight and staying quite active.  She is doing well with no complaints today.  Her ABIs today are 0.94 on the right and 1.01 on the left with biphasic waveforms.  Her aneurysm duplex shows a patent stent graft without endoleak and a maximal aortic sac size of less than 3 cm.  Current Outpatient Medications  Medication Sig Dispense Refill  . amLODipine (NORVASC) 10 MG tablet Take 1 tablet by mouth once daily 30 tablet 6  . aspirin 81 MG EC tablet Take 1 tablet (81 mg total) by mouth daily. 90 tablet 3  . cholecalciferol (VITAMIN D3) 25 MCG (1000 UNIT) tablet Take 50,000 Units by mouth once a week.    . cloNIDine (CATAPRES) 0.1 MG tablet Take 0.1 mg by mouth daily.    . D3-50 1.25 MG (50000 UT) capsule Take 50,000 Units by mouth once a week.    . empagliflozin (JARDIANCE) 10 MG TABS tablet Take by mouth daily.    Marland Kitchen esomeprazole (NEXIUM) 40 MG capsule Take 40 mg by mouth daily.    Marland Kitchen ezetimibe (ZETIA) 10 MG tablet Take 1 tablet (10 mg total) by mouth daily. 90 tablet 4  . gabapentin (NEURONTIN) 600 MG tablet Take 600 mg by mouth 3 (three) times daily as needed.    Marland Kitchen LIPITOR 80 MG tablet Take 1 tablet (80 mg total) by mouth daily. 30 tablet 6  . losartan (COZAAR) 100 MG tablet Take 1 tablet by mouth once daily 30 tablet 6  . metoprolol succinate (TOPROL-XL) 50 MG 24 hr tablet Take 50 mg by mouth daily. Take with or immediately following a meal.    . omeprazole (PRILOSEC) 20 MG capsule Take 1 capsule (20 mg total) by  mouth 2 (two) times daily before a meal. 60 capsule 1  . PLAVIX 75 MG tablet Take 1 tablet (75 mg total) by mouth daily. 30 tablet 6  . sucralfate (CARAFATE) 1 g tablet Take 1 tablet (1 g total) by mouth 4 (four) times daily -  with meals and at bedtime. 30 tablet 1  . docusate sodium (COLACE) 100 MG capsule Take 1 capsule (100 mg total) by mouth daily. 10 capsule 0  . famotidine (PEPCID) 40 MG tablet Take by mouth daily.    . hydrOXYzine (ATARAX/VISTARIL) 25 MG tablet Take 25 mg by mouth every 6 (six) hours as needed.    . nitroGLYCERIN (NITROSTAT) 0.4 MG SL tablet Place 1 tablet (0.4 mg total) under the tongue every 5 (five) minutes as needed for chest pain. 25 tablet 3   No current facility-administered medications for this visit.    Past Medical History:  Diagnosis Date  . CAD (coronary artery disease)    a. PCI to proximal and distal RCA 3/26  . GERD (gastroesophageal reflux disease)   . Heart murmur    MR  . Hiatal hernia   . Hyperlipidemia   . Hypertension   . Tobacco abuse    a. Started at age 89, quit during 3 pregnancies.  b. 1 PPD for a 40 pack-year hx (2019)     Past Surgical History:  Procedure Laterality Date  . BREAST BIOPSY Right    Multiple biopsies-all benign  . BREAST SURGERY  1986   I&D for 'milk duct'  . CORONARY STENT INTERVENTION N/A 01/13/2019   Procedure: CORONARY STENT INTERVENTION;  Surgeon: Nelva Bush, MD;  Location: [facility] [facility];  Service: Cardiovascular;  Laterality: N/A;  . EMBOLECTOMY  08/13/2018   Procedure: Right SFA and profunda femoris Fogarty embolectomy 3  Fogarty embolectomy balloon;  Surgeon: Algernon Huxley, MD;  Location: [facility];  Service: Vascular;;  . ENDARTERECTOMY FEMORAL Right 08/13/2018   Procedure: Right common femoral, profunda femoris, and superficial femoral artery endarterectomies and patch angioplasty ;  Surgeon: Algernon Huxley, MD;  Location: [facility];  Service: Vascular;  Laterality: Right;  . ENDOVASCULAR  REPAIR/STENT GRAFT N/A 08/13/2018   Procedure: ENDOVASCULAR REPAIR/STENT GRAFT;  Surgeon: Katha Cabal, MD;  Location: [facility];  Service: Cardiovascular;  Laterality: N/A;  . LEFT HEART CATH AND CORONARY ANGIOGRAPHY Left 01/13/2019   Procedure: LEFT HEART CATH AND CORONARY ANGIOGRAPHY;  Surgeon: Minna Merritts, MD;  Location: [facility];  Service: Cardiovascular;  Laterality: Left;  . SALPINGOOPHORECTOMY Left 2000     Social History   Tobacco Use  . Smoking status: Former Smoker    Packs/day: 0.25    Years: 38.00    Pack years: 9.50    Types: Cigarettes    Quit date: 08/10/2018    Years since quitting: 2.4  . Smokeless tobacco: Never Used  Vaping Use  . Vaping Use: Never used  Substance Use Topics  . Alcohol use: Yes    Comment: occassional  . Drug use: Yes    Types: Marijuana    Comment: smokes Marijuana 'when I cant sleep, maybe once or twice a week'      Family History  Problem Relation Age of Onset  . Diabetes Mother   . Hypertension Mother   . Hyperlipidemia Mother   . Heart disease Mother        CABG X 4  . Asthma Mother   . Sarcoidosis Mother        In remission  . Heart attack Mother   . Diabetes Maternal Grandmother   . Heart disease Maternal Grandmother   . Hyperlipidemia Maternal Grandmother     Allergies  Allergen Reactions  . Strawberry Extract Hives and Swelling  . Strawberry Flavor Rash     REVIEW OF SYSTEMS (Negative unless checked)  Constitutional: [] Weight loss  [] Fever  [] Chills Cardiac: [] Chest pain   [] Chest pressure   [] Palpitations   [] Shortness of breath when laying flat   [] Shortness of breath at rest   [] Shortness of breath with exertion. Vascular:  [] Pain in legs with walking   [] Pain in legs at rest   [] Pain in legs when laying flat   [] Claudication   [x] Pain in feet when walking  [] Pain in feet at rest  [] Pain in feet when laying flat   [] History of DVT   [] Phlebitis   [] Swelling in legs   [] Varicose  veins   [] Non-healing ulcers Pulmonary:   [] Uses home oxygen   [] Productive cough   [] Hemoptysis   [] Wheeze  [] COPD   [] Asthma Neurologic:  [] Dizziness  [] Blackouts   [] Seizures   [] History of stroke   [] History of TIA  [] Aphasia   [] Temporary blindness   [] Dysphagia   [] Weakness or numbness in  arms   [] Weakness or numbness in legs Musculoskeletal:  [x] Arthritis   [] Joint swelling   [] Joint pain   [x] Low back pain Hematologic:  [] Easy bruising  [] Easy bleeding   [] Hypercoagulable state   [] Anemic   Gastrointestinal:  [] Blood in stool   [] Vomiting blood  [] Gastroesophageal reflux/heartburn   [] Abdominal pain Genitourinary:  [] Chronic kidney disease   [] Difficult urination  [] Frequent urination  [] Burning with urination   [] Hematuria Skin:  [] Rashes   [] Ulcers   [] Wounds Psychological:  [] History of anxiety   []  History of major depression.  Physical Examination  BP (!) 206/108 (BP Location: Right Arm)   Pulse 76   Resp 16   Wt 176 lb 9.6 oz (80.1 kg)   BMI 28.50 kg/m  Gen:  WD/WN, NAD Head: [place]/AT, No temporalis wasting. Ear/Nose/Throat: Hearing grossly intact, nares w/o erythema or drainage Eyes: Conjunctiva clear. Sclera non-icteric Neck: Supple.  Trachea midline Pulmonary:  Good air movement, no use of accessory muscles.  Cardiac: RRR, no JVD Vascular:  Vessel Right Left  Radial Palpable Palpable                          PT Palpable Palpable  DP Palpable Palpable   Gastrointestinal: soft, non-tender/non-distended. No guarding/reflex.  Musculoskeletal: M/S 5/5 throughout.  No deformity or atrophy. No edema. Neurologic: Sensation grossly intact in extremities.  Symmetrical.  Speech is fluent.  Psychiatric: Judgment intact, Mood & affect appropriate for pt's clinical situation. Dermatologic: No rashes or ulcers noted.  No cellulitis or open wounds.       Labs No results found for this or any previous visit (from the past 2160 hour(s)).  Radiology No results  found.  Assessment/Plan Hypertension blood pressure control important in reducing the progression of atherosclerotic disease. On appropriate oral medications.   Diabetes ([place]) blood glucose control important in reducing the progression of atherosclerotic disease. Also, involved in wound healing. On appropriate medications.  PAD (peripheral artery disease) (HCC) ABIs today are 0.94 on the right and 1.01 on the left with biphasic waveforms.  Continue antiplatelet therapy and statin agent.  Recheck in 1 year.  AAA (abdominal aortic aneurysm) (HCC) Duplex shows a patent stent graft with no evidence of endoleak and a maximal sac diameter of less than 3 cm.  Doing well after endovascular repair.  Recheck in 1 year.    [name], MD  01/04/2021 11:02 AM    This note was created with Dragon medical transcription system.  Any errors from dictation are purely unintentional

## 2021-01-25 ENCOUNTER — Telehealth: Payer: Self-pay | Admitting: Cardiovascular Disease

## 2021-01-25 NOTE — Telephone Encounter (Signed)
Call back to patient who is experiencing what she states is a "migraine."  She reports her head hurts in the same area as her migraines normally do. She denies any slurred speech, facial drooping nausea, etc.  She reports because of her headache she went to the nurse at her work who checked her Bp which was 180/104-64.  Pt confirms she took her Bp meds this morning.  Pt confirms her only symptom is the headache.  Discussed patients symptoms with Dr Donivan Scull nurse Leafy Ro who agrees the discomfort of the migraine maybe one of the causes of patient's elevated BP.  Patient's Bp at her last office visit with Dr Lucky Cowboy was 200+/100+.  Pt states she has taken migraine medication without relief.  Pt advised to recheck her Bp once migraine is resolved and call back if Bp remains elevated. Pt attempted to see her PCP today, but was told there wasn't any availability.  Pt instructed to notify our office or 911 if she notes any additional symptoms of facial droop, slurred speech limb numbness or weakness etc.  Pt states "don't worry I am well aware of the symptoms and I will call back or 911."  Will route message to Dr Rockey Situ for additional instructions if needed.

## 2021-01-25 NOTE — Telephone Encounter (Signed)
Pt c/o BP issue: STAT if pt c/o blurred vision, one-sided weakness or slurred speech  1. What are your last 5 BP readings?  180/104  2. Are you having any other symptoms (ex. Dizziness, headache, blurred vision, passed out)? Migraine, swelling in legs   3. What is your BP issue? BP is high, patient is at work and work nurse told her to call

## 2021-01-29 NOTE — Telephone Encounter (Signed)
Can we call to follow up on her blood pressure, Would clarify her clonidine script Typically we use clonidine 0.1 BID, not daily If blood pressure continues to run , we could use clonidine 0.1  BID with extra clonidine as needed for SBP>160

## 2021-01-30 NOTE — Telephone Encounter (Signed)
Was able to reach out to Heather Mahoney regarding her BP and headaches from last week, reports they have improved, but stress from work and the pollen has also contributed to her HTN. BP the past 2 days have been "hoovering around 155/100-140-100" Advised pt of Dr. Donivan Scull advice   "Typically we use clonidine 0.1 BID, not daily If blood pressure continues to run , we could use clonidine 0.1 BID with extra clonidine as needed for SBP>160"  Heather Mahoney verbalized understanding, will take clonidine BID and extra when needed and continue to monitor BP. She is very grateful for the return call for checking in on her and update of medication. Pt will call back with any further concerns. Will change pt's med list to reflect BID clonidine.

## 2021-05-23 ENCOUNTER — Other Ambulatory Visit: Payer: Self-pay

## 2021-05-23 ENCOUNTER — Ambulatory Visit: Payer: Self-pay | Admitting: Family Medicine

## 2021-05-23 DIAGNOSIS — Z113 Encounter for screening for infections with a predominantly sexual mode of transmission: Secondary | ICD-10-CM

## 2021-05-23 LAB — HM HEPATITIS C SCREENING LAB: HM Hepatitis Screen: NEGATIVE

## 2021-05-23 LAB — HM HIV SCREENING LAB: HM HIV Screening: NEGATIVE

## 2021-05-23 LAB — WET PREP FOR TRICH, YEAST, CLUE
Trichomonas Exam: NEGATIVE
Yeast Exam: NEGATIVE

## 2021-05-23 NOTE — Progress Notes (Signed)
[facility] Department STI clinic/screening visit  Subjective:  Heather Mahoney is a 55 y.o. female being seen today for an STI screening visit. The patient reports they do not have symptoms.  Patient reports that they do not desire a pregnancy in the next year.   They reported they are not interested in discussing contraception today.  No LMP recorded. Patient is postmenopausal.   Patient has the following medical conditions:   Patient Active Problem List   Diagnosis Date Noted   Acute gastroenteritis 04/26/2020   History of endovascular stent graft for abdominal aortic aneurysm 04/26/2020   Urticaria 04/26/2020   Diabetes ([place]) 07/05/2019   Precordial chest pain    Chest pain 04/09/2019   Atherosclerosis of native arteries of extremity with intermittent claudication ([place]) 04/01/2019   Coronary artery disease of native artery of native heart with stable angina pectoris ([place]) 01/26/2019   CAD (coronary artery disease) 01/14/2019   Unstable angina ([place]) 01/13/2019   PAD (peripheral artery disease) ([place]) 08/30/2018   AAA (abdominal aortic aneurysm) ([place]) 08/10/2018   Chest pain with moderate risk for cardiac etiology 12/18/2017   Scotoma 12/18/2017   PVC (premature ventricular contraction) 02/12/2016   Essential hypertension, benign 02/12/2016   Tobacco use disorder 02/12/2016   Migraine headache 01/09/2016   Lymphocytosis 12/21/2015   Hypertension 12/12/2015   Mixed hyperlipidemia 12/12/2015   Vitamin D deficiency 12/12/2015   Mitral regurgitation 12/12/2015   Hypertensive urgency 11/29/2015   Abdominal pulsatile mass 11/29/2015    Chief Complaint  Patient presents with   SEXUALLY TRANSMITTED DISEASE    Screening     HPI  Patient reports here for screening.  Denies having s/sx   //Last HIV test per patient/review of record was 04/27/2020 Patient reports last pap was 2017.   See flowsheet for further details and programmatic requirements.    The following  portions of the patient's history were reviewed and updated as appropriate: allergies, current medications, past medical history, past social history, past surgical history and problem list.  Objective:  There were no vitals filed for this visit.  Physical Exam Vitals and nursing note reviewed.  Constitutional:      Appearance: Normal appearance.  HENT:     Head: Normocephalic and atraumatic.     Mouth/Throat:     Mouth: Mucous membranes are moist.     Pharynx: Oropharynx is clear. No oropharyngeal exudate or posterior oropharyngeal erythema.  Pulmonary:     Effort: Pulmonary effort is normal.  Chest:  Breasts:    Right: No axillary adenopathy or supraclavicular adenopathy.     Left: No axillary adenopathy or supraclavicular adenopathy.  Abdominal:     General: Abdomen is flat.     Palpations: There is no mass.     Tenderness: There is no abdominal tenderness. There is no rebound.  Genitourinary:    Exam position: Lithotomy position.     Pubic Area: No rash or pubic lice.      Labia:        Right: No rash or lesion.        Left: No rash or lesion.      Vagina: Normal. No vaginal discharge, erythema, bleeding or lesions.     Cervix: No cervical motion tenderness, discharge, friability, lesion or erythema.     Uterus: Normal.      Adnexa: Right adnexa normal and left adnexa normal.     Comments: Deferred, pt self collected   Lymphadenopathy:     Head:  Right side of head: No preauricular or posterior auricular adenopathy.     Left side of head: No preauricular or posterior auricular adenopathy.     Cervical: No cervical adenopathy.     Upper Body:     Right upper body: No supraclavicular or axillary adenopathy.     Left upper body: No supraclavicular or axillary adenopathy.     Lower Body: No right inguinal adenopathy. No left inguinal adenopathy.  Skin:    General: Skin is warm and dry.     Findings: No rash.  Neurological:     Mental Status: She is alert and  oriented to person, place, and time.     Assessment and Plan:  Heather Mahoney is a 55 y.o. female presenting to the [facility] Department for STI screening  1. Screening examination for venereal disease  - Chlamydia/Gonorrhea [place] [facility] - HBV Antigen/Antibody [facility] - HIV/HCV [facility] - Syphilis Serology,  Lab - [place], [place], [facility] Patient accepted all screenings including wet prep, vaginal CT/GC and bloodwork for HIV/RPR.  Patient meets criteria for HepB screening? Yes. Ordered? No - declined.  Patient meets criteria for HepC screening? Yes. Ordered? No - declined   Wet prep results neg    No Treatment needed  Discussed time line for State Lab results and that patient will be called with positive results and encouraged patient to call if she had not heard in 2 weeks.  Counseled to return or seek care for continued or worsening symptoms Recommended condom use with all sex  Patient is currently using  post- menopausal  to prevent pregnancy.      Return for as needed.  Future Appointments  Date Time Provider [place]  07/09/2021 11:40 AM Minna Merritts, MD [id number]  01/03/2022  8:30 AM [facility] None  01/03/2022  9:30 AM [facility] None  01/03/2022 10:00 AM Dew, Erskine Squibb, MD [facility] None    Junious Dresser, FNP

## 2021-05-23 NOTE — Progress Notes (Signed)
Pt here for STD screening.  Wet mount results reviewed, no treatment required.  Pt declined condoms. Windle Guard, RN

## 2021-07-08 NOTE — Progress Notes (Signed)
Date:  07/09/2021   ID:  Heather Mahoney, Heather Mahoney 06-20-1966, MRN [id number]  Patient Location:  [address] [place] [address]   Provider location:   [facility], [place] office  PCP:  Alene Mires Elyse Jarvis, MD  Cardiologist:  [facility]  Chief Complaint  Patient presents with   12 month follow up     Patient c/o bilateral leg pain with walking, chest pain/discomfort with activity. Medications reviewed by the patient verbally.     History of Present Illness:    Heather Mahoney is a 55 y.o. female  past medical history of Smoker GERD HTN PVCs Fall and question of loss of consciousness 2017, secondary to orthostasis,  AAA with endovascular repair October 2019, access site on the right groin CAD, stent x2 to RCA March 2020, Access site left groin Who presents today for follow-up of her chest pain/angina, shortness of breath, CAD, results of her recent cardiac CT  New puppy, In a house,  Daughter lives with her Works at [employer], watches monitors  overall doing well, life is stable  Stopped smoking >2 years ago  GERD symptoms stable  Chronic claudication pain, Followed by Dr. Lucky Cowboy Neuropathy, chronic pain in her feet  Reports taking Lipitor and Zetia, cholesterol well above goal Requesting to change to Crestor  Denies chest pain concerning for angina  EKG personally reviewed by myself on todays visit Shows normal sinus rhythm rate 72 bpm no significant ST or T wave changes  Lab work reviewed, cholesterol numbers above goal  Other past medical history reviewed cardiac CTA showing stenosis in RCA by FFR   cardiac catheterization January 13, 2019  Cardiac cath 01/13/2019 Single-vessel disease notably of the RCA moderate to severe proximal  disease estimated 75% and distal RCA disease estimated at 80% There is disease of high OM vessel, small in caliber diffusely diseased Mild proximal LAD disease  --- stenting of the proximal  RCA and distal RCA  subsequent left groin hematoma causing hypotension, vagal response Was kept for 2 days post procedure in the hospital Her  Medication intolerances  isosorbide as this caused a headache   peripheral vascular catheterization in October 2019  AAA repair, endograft   She reports strong family history of coronary disease Mother with bypass surgery Her grandmother, mother side, passed away at the age of 45 from a brain aneurysm.   Cardiac CTA cardiac CT scan was ordered showing severe stenosis mid RCA (FFR less than 0.7), indeterminate stenosis LAD and circumflex (FFR less than 0.8)   Past Medical History:  Diagnosis Date   CAD (coronary artery disease)    a. PCI to proximal and distal RCA 3/26   GERD (gastroesophageal reflux disease)    Heart murmur    MR   Hiatal hernia    Hyperlipidemia    Hypertension    Tobacco abuse    a. Started at age 11, quit during 3 pregnancies. b. 1 PPD for a 40 pack-year hx (2019)    Past Surgical History:  Procedure Laterality Date   BREAST BIOPSY Right    Multiple biopsies-all benign   BREAST SURGERY  1986   I&D for 'milk duct'   CORONARY STENT INTERVENTION N/A 01/13/2019   Procedure: CORONARY STENT INTERVENTION;  Surgeon: Nelva Bush, MD;  Location: [facility];  Service: Cardiovascular;  Laterality: N/A;   EMBOLECTOMY  08/13/2018   Procedure: Right SFA and profunda femoris Fogarty embolectomy 3  Fogarty embolectomy  balloon;  Surgeon: Algernon Huxley, MD;  Location: [facility];  Service: Vascular;;   ENDARTERECTOMY FEMORAL Right 08/13/2018   Procedure: Right common femoral, profunda femoris, and superficial femoral artery endarterectomies and patch angioplasty ;  Surgeon: Algernon Huxley, MD;  Location: [facility];  Service: Vascular;  Laterality: Right;   ENDOVASCULAR REPAIR/STENT GRAFT N/A 08/13/2018   Procedure: ENDOVASCULAR REPAIR/STENT GRAFT;  Surgeon: Katha Cabal, MD;  Location: [facility];  Service:  Cardiovascular;  Laterality: N/A;   LEFT HEART CATH AND CORONARY ANGIOGRAPHY Left 01/13/2019   Procedure: LEFT HEART CATH AND CORONARY ANGIOGRAPHY;  Surgeon: Minna Merritts, MD;  Location: [facility];  Service: Cardiovascular;  Laterality: Left;   SALPINGOOPHORECTOMY Left 2000     Current Meds  Medication Sig   amLODipine (NORVASC) 10 MG tablet Take 1 tablet by mouth once daily   aspirin 81 MG EC tablet Take 1 tablet (81 mg total) by mouth daily.   cloNIDine (CATAPRES) 0.1 MG tablet Take 0.1 mg by mouth in the morning and at bedtime. Take an extra 0.1 mg for BP >160   cyclobenzaprine (FLEXERIL) 10 MG tablet Take 10 mg by mouth 3 (three) times daily.   empagliflozin (JARDIANCE) 10 MG TABS tablet Take by mouth daily.   esomeprazole (NEXIUM) 40 MG capsule Take 40 mg by mouth daily.   ezetimibe (ZETIA) 10 MG tablet Take 1 tablet (10 mg total) by mouth daily.   gabapentin (NEURONTIN) 600 MG tablet Take 600 mg by mouth 3 (three) times daily as needed.   losartan (COZAAR) 100 MG tablet Take 1 tablet by mouth once daily   methocarbamol (ROBAXIN) 500 MG tablet Take 1,000 mg by mouth 4 (four) times daily.   metoprolol succinate (TOPROL-XL) 50 MG 24 hr tablet Take 50 mg by mouth daily. Take with or immediately following a meal.   nitroGLYCERIN (NITROSTAT) 0.4 MG SL tablet Place 1 tablet (0.4 mg total) under the tongue every 5 (five) minutes as needed for chest pain.   omeprazole (PRILOSEC) 20 MG capsule Take 1 capsule (20 mg total) by mouth 2 (two) times daily before a meal.   PLAVIX 75 MG tablet Take 1 tablet (75 mg total) by mouth daily.   rosuvastatin (CRESTOR) 40 MG tablet Take 1 tablet (40 mg total) by mouth daily.   sucralfate (CARAFATE) 1 g tablet Take 1 tablet (1 g total) by mouth 4 (four) times daily -  with meals and at bedtime.   [DISCONTINUED] LIPITOR 80 MG tablet Take 1 tablet (80 mg total) by mouth daily.     Allergies:   Strawberry extract and Strawberry flavor   Social  History   Tobacco Use   Smoking status: Former    Packs/day: 0.25    Years: 38.00    Pack years: 9.50    Types: Cigarettes    Quit date: 08/10/2018    Years since quitting: 2.9   Smokeless tobacco: Never  Vaping Use   Vaping Use: Never used  Substance Use Topics   Alcohol use: Yes    Comment: occassional   Drug use: Not Currently    Frequency: 2.0 times per week    Types: Marijuana    Comment: smokes Marijuana 'when I cant sleep, maybe once or twice a week'      Family Hx: The patient's family history includes Asthma in her mother; Diabetes in her maternal grandmother and mother; Heart attack in her mother; Heart disease in her maternal grandmother and mother;  Hyperlipidemia in her maternal grandmother and mother; Hypertension in her mother; Sarcoidosis in her mother.  ROS:   Please see the history of present illness.    Review of Systems  Constitutional: Negative.   HENT: Negative.    Respiratory: Negative.    Cardiovascular: Negative.   Gastrointestinal: Negative.   Musculoskeletal: Negative.   Neurological: Negative.   Psychiatric/Behavioral: Negative.    All other systems reviewed and are negative.   Labs/Other Tests and Data Reviewed:    Recent Labs: No results found for requested labs within last 8760 hours.   Recent Lipid Panel Lab Results  Component Value Date/Time   CHOL 236 (H) 07/09/2020 06:03 PM   CHOL 266 (H) 12/06/2018 10:17 AM   TRIG 170 (H) 07/09/2020 06:03 PM   HDL 53 07/09/2020 06:03 PM   HDL 64 12/06/2018 10:17 AM   CHOLHDL 4.5 07/09/2020 06:03 PM   LDLCALC 149 (H) 07/09/2020 06:03 PM   LDLCALC 178 (H) 12/06/2018 10:17 AM   LDLDIRECT 185 (H) 12/06/2018 10:17 AM    Wt Readings from Last 3 Encounters:  07/09/21 179 lb 4 oz (81.3 kg)  01/04/21 176 lb 9.6 oz (80.1 kg)  07/09/20 171 lb 8 oz (77.8 kg)     Exam:   BP (!) 150/90 (BP Location: Left Arm, Patient Position: Sitting, Cuff Size: Normal)   Pulse 72   Ht '5\' 6"'$  (1.676 m)   Wt 179  lb 4 oz (81.3 kg)   SpO2 98%   BMI 28.93 kg/m  Constitutional:  oriented to person, place, and time. No distress.  HENT:  Head: Grossly normal Eyes:  no discharge. No scleral icterus.  Neck: No JVD, no carotid bruits  Cardiovascular: Regular rate and rhythm, no murmurs appreciated Pulmonary/Chest: Clear to auscultation bilaterally, no wheezes or rails Abdominal: Soft.  no distension.  no tenderness.  Musculoskeletal: Normal range of motion Neurological:  normal muscle tone. Coordination normal. No atrophy Skin: Skin warm and dry Psychiatric: normal affect, pleasant   ASSESSMENT & PLAN:    Coronary artery disease of native artery of native heart with stable angina pectoris ([place])  stent placement x2,  Denies anginal symptoms Long discussion concerning her cholesterol management Recommend Crestor 40, stop Lipitor, continue Zetia, lipid panel 3 months If still elevated may need to add PCSK9 inhibitor  PAD (peripheral artery disease) (HCC) AAA with endograft placement  Followed by Dr. Lucky Cowboy Lower extremity neuropathy  Abdominal aortic aneurysm (AAA) without rupture ([place]) Monitored by Dr. Lucky Cowboy  Essential hypertension, benign Resting into the office today, numbers elevated Recommend she monitor pressures at home  Mixed hyperlipidemia Changes as above  Tobacco use disorder Stopped 2 years ago    Total encounter time more than 25 minutes  Greater than 50% was spent in counseling and coordination of care with the patient   Signed, Ida Rogue, MD  07/09/2021 1:06 PM    [id number], [place], [place] [address]

## 2021-07-09 ENCOUNTER — Ambulatory Visit (INDEPENDENT_AMBULATORY_CARE_PROVIDER_SITE_OTHER): Payer: Self-pay | Admitting: Cardiovascular Disease

## 2021-07-09 ENCOUNTER — Other Ambulatory Visit: Payer: Self-pay

## 2021-07-09 ENCOUNTER — Encounter: Payer: Self-pay | Admitting: Cardiovascular Disease

## 2021-07-09 VITALS — BP 150/90 | HR 72 | Ht 66.0 in | Wt 179.2 lb

## 2021-07-09 DIAGNOSIS — E785 Hyperlipidemia, unspecified: Secondary | ICD-10-CM

## 2021-07-09 DIAGNOSIS — I10 Essential (primary) hypertension: Secondary | ICD-10-CM

## 2021-07-09 DIAGNOSIS — I714 Abdominal aortic aneurysm, without rupture, unspecified: Secondary | ICD-10-CM

## 2021-07-09 DIAGNOSIS — F17200 Nicotine dependence, unspecified, uncomplicated: Secondary | ICD-10-CM

## 2021-07-09 DIAGNOSIS — E782 Mixed hyperlipidemia: Secondary | ICD-10-CM

## 2021-07-09 DIAGNOSIS — I739 Peripheral vascular disease, unspecified: Secondary | ICD-10-CM

## 2021-07-09 DIAGNOSIS — Z79899 Other long term (current) drug therapy: Secondary | ICD-10-CM

## 2021-07-09 DIAGNOSIS — I25118 Atherosclerotic heart disease of native coronary artery with other forms of angina pectoris: Secondary | ICD-10-CM

## 2021-07-09 MED ORDER — ROSUVASTATIN CALCIUM 40 MG PO TABS: 40.0000 mg | ORAL_TABLET | Freq: Every day | ORAL | 3 refills | Status: DC

## 2021-07-09 NOTE — Patient Instructions (Addendum)
Medication Instructions:  Please STOP lipitor  Please START Crestor 40 mg daily Stay on zetia 10 mg once a day with Crestor   If you need a refill on your cardiac medications before your next appointment, please call your pharmacy.   Lab work: Lipids in 3 months  Testing/Procedures: No new testing needed  Follow-Up: At [facility], you and your health needs are our priority.  As part of our continuing mission to provide you with exceptional heart care, we have created designated Provider Care Teams.  These Care Teams include your primary Cardiologist (physician) and Advanced Practice Providers (APPs -  Physician Assistants and Nurse Practitioners) who all work together to provide you with the care you need, when you need it.  You will need a follow up appointment in 6 months  Providers on your designated Care Team:   Murray Hodgkins, NP Christell Faith, PA-C Marrianne Mood, PA-C Cadence Kathlen Mody, [place]  Any Other Special Instructions Will Be Listed Below (If Applicable).  COVID-19 Vaccine Information can be found at: ShippingScam.co.uk For questions related to vaccine distribution or appointments, please email [email or web address] or call [phone].

## 2021-08-15 NOTE — Addendum Note (Signed)
Addended by: Junious Dresser on: 08/15/2021 04:41 PM   Modules accepted: Orders

## 2021-11-07 ENCOUNTER — Emergency Department
Admission: EM | Admit: 2021-11-07 | Discharge: 2021-11-07 | Disposition: A | Discharge status: Home / Self Care | Payer: BLUE CROSS/BLUE SHIELD | Attending: Emergency Medicine | Admitting: Emergency Medicine

## 2021-11-07 ENCOUNTER — Other Ambulatory Visit: Payer: Self-pay

## 2021-11-07 ENCOUNTER — Emergency Department: Payer: BLUE CROSS/BLUE SHIELD

## 2021-11-07 DIAGNOSIS — Z8719 Personal history of other diseases of the digestive system: Secondary | ICD-10-CM | POA: Insufficient documentation

## 2021-11-07 DIAGNOSIS — I10 Essential (primary) hypertension: Secondary | ICD-10-CM | POA: Insufficient documentation

## 2021-11-07 DIAGNOSIS — R079 Chest pain, unspecified: Secondary | ICD-10-CM

## 2021-11-07 DIAGNOSIS — R0789 Other chest pain: Secondary | ICD-10-CM | POA: Diagnosis present

## 2021-11-07 LAB — CBC
HCT: 38.1 % (ref 36.0–46.0)
Hemoglobin: 12.5 g/dL (ref 12.0–15.0)
MCH: 26.4 pg (ref 26.0–34.0)
MCHC: 32.8 g/dL (ref 30.0–36.0)
MCV: 80.4 fL (ref 80.0–100.0)
Platelets: 465 10*3/uL — ABNORMAL HIGH (ref 150–400)
RBC: 4.74 MIL/uL (ref 3.87–5.11)
RDW: 15.9 % — ABNORMAL HIGH (ref 11.5–15.5)
WBC: 11.5 10*3/uL — ABNORMAL HIGH (ref 4.0–10.5)
nRBC: 0 % (ref 0.0–0.2)

## 2021-11-07 LAB — BASIC METABOLIC PANEL
Anion gap: 9 (ref 5–15)
BUN: 12 mg/dL (ref 6–20)
CO2: 27 mmol/L (ref 22–32)
Calcium: 9.5 mg/dL (ref 8.9–10.3)
Chloride: 104 mmol/L (ref 98–111)
Creatinine, Ser: 0.58 mg/dL (ref 0.44–1.00)
GFR, Estimated: >60 mL/min (ref >60)
Glucose, Bld: 125 mg/dL — ABNORMAL HIGH (ref 70–99)
Potassium: 3.7 mmol/L (ref 3.5–5.1)
Sodium: 140 mmol/L (ref 135–145)

## 2021-11-07 LAB — TROPONIN I (HIGH SENSITIVITY)
Troponin I (High Sensitivity): 3 ng/L (ref <18)
Troponin I (High Sensitivity): 7 ng/L (ref <18)

## 2021-11-07 MED ORDER — LABETALOL HCL 5 MG/ML IV SOLN: 20.0000 mg | Freq: Once | INTRAVENOUS | Status: DC

## 2021-11-07 MED ORDER — CLOPIDOGREL BISULFATE 75 MG PO TABS
75.0000 mg | ORAL_TABLET | Freq: Once | ORAL | Status: AC
Start: 2021-11-07 — End: 2021-11-07
  Administered 2021-11-07: 75 mg via ORAL
  Filled 2021-11-07: qty 1

## 2021-11-07 MED ORDER — AMLODIPINE BESYLATE 5 MG PO TABS
10.0000 mg | ORAL_TABLET | Freq: Once | ORAL | Status: AC
  Administered 2021-11-07: 10 mg via ORAL
  Filled 2021-11-07: qty 2

## 2021-11-07 MED ORDER — LOSARTAN POTASSIUM 50 MG PO TABS
100.0000 mg | ORAL_TABLET | Freq: Once | ORAL | Status: AC
  Administered 2021-11-07: 100 mg via ORAL
  Filled 2021-11-07: qty 2

## 2021-11-07 MED ORDER — METOPROLOL SUCCINATE ER 50 MG PO TB24
50.0000 mg | ORAL_TABLET | Freq: Every day | ORAL | Status: DC
  Administered 2021-11-07: 50 mg via ORAL
  Filled 2021-11-07: qty 1

## 2021-11-07 MED ORDER — IOHEXOL 350 MG/ML SOLN
100.0000 mL | Freq: Once | INTRAVENOUS | Status: AC | PRN
Start: 2021-11-07 — End: 2021-11-07
  Administered 2021-11-07: 100 mL via INTRAVENOUS

## 2021-11-07 MED ORDER — CLONIDINE HCL 0.1 MG PO TABS
0.1000 mg | ORAL_TABLET | Freq: Once | ORAL | Status: AC
  Administered 2021-11-07: 0.1 mg via ORAL
  Filled 2021-11-07: qty 1

## 2021-11-07 NOTE — Discharge Instructions (Addendum)
Please start taking your GERD medication at night instead of in the morning

## 2021-11-07 NOTE — ED Provider Notes (Signed)
[facility] Provider Note    Event Date/Time   First MD Initiated Contact with Patient 11/07/21 [id number]     (approximate)   History   Chest Pain and Hypertension  HPI  Heather Mahoney is a 56 y.o. female who presents from urgent care with concerns of hypertension and chest pain.  Patient states she has a history of aortic aneurysm repair as well as cardiovascular disease and was sent from outside clinic after telling the patient that she may have problems with her aorta and would need a CT angiogram.  Patient states that she only went to her physician for blood work earlier today and has not taken any of her blood pressure medicines as she was fasting for blood work.  Patient describes this chest pain as substernal and going from the midepigastric region up to the back of her throat.  Patient denies any associated shortness of breath or dyspnea on exertion     Physical Exam   Triage Vital Signs: ED Triage Vitals  Enc Vitals Group     BP 11/07/21 0924 (!) 189/118     Pulse Rate 11/07/21 0924 88     Resp 11/07/21 0924 17     Temp 11/07/21 0924 98.8 F (37.1 C)     Temp Source 11/07/21 0924 Oral     SpO2 11/07/21 0924 98 %     Weight 11/07/21 0912 176 lb (79.8 kg)     Height 11/07/21 0912 5\' 6"  (1.676 m)     Head Circumference --      Peak Flow --      Pain Score 11/07/21 0912 5     Pain Loc --      Pain Edu? --      Excl. in [place]? --     Most recent vital signs: Vitals:   11/07/21 1230 11/07/21 1515  BP: (!) 218/100 (!) 150/104  Pulse: 81 79  Resp: 20 18  Temp:    SpO2: 100% 98%    General: Awake, no distress.  CV:  Good peripheral perfusion.  Resp:  Normal effort.  Abd:  No distention.  Other:  Middle-aged African-American female sitting in bed in no distress   ED Results / Procedures / Treatments   Labs (all labs ordered are listed, but only abnormal results are displayed) Labs Reviewed  BASIC METABOLIC PANEL - Abnormal; Notable for  the following components:      Result Value   Glucose, Bld 125 (*)    All other components within normal limits  CBC - Abnormal; Notable for the following components:   WBC 11.5 (*)    RDW 15.9 (*)    Platelets 465 (*)    All other components within normal limits  TROPONIN I (HIGH SENSITIVITY)  TROPONIN I (HIGH SENSITIVITY)     EKG ED ECG REPORT I, Naaman Plummer, the attending physician, personally viewed and interpreted this ECG.  Date: 11/07/2021 EKG Time: 0914 Rate: 84 Rhythm: normal sinus rhythm QRS Axis: normal Intervals: normal ST/T Wave abnormalities: normal Narrative Interpretation: no evidence of acute ischemia   RADIOLOGY  ED MD interpretation: 2 view chest x-ray shows no evidence of acute abnormalities including no pneumonia, pneumothorax, or widened mediastinum  CT angiography of the chest abdomen and pelvis shows no thoracic or abdominal aortic aneurysm or dissection as well as no acute abnormality seen within the abdomen/pelvis  Agree with radiology assessment  Official radiology report(s): DG Chest 2 View  Result Date:  11/07/2021 CLINICAL DATA:  Hypertension.  Chest pain. EXAM: CHEST - 2 VIEW COMPARISON:  07/05/2020 FINDINGS: Heart size is normal. Mediastinal shadows show mild coronary artery calcification and aortic atherosclerotic calcification. Vascularity otherwise appears normal. Small linear scar in the lingula. The lungs are otherwise clear. No effusion. IMPRESSION: No active disease. Aortic atherosclerotic calcification and coronary artery calcification/stent. Electronically Signed   By: Nelson Chimes M.D.   On: 11/07/2021 09:49   CT Angio Chest/Abd/Pel for Dissection W and/or W/WO  Result Date: 11/07/2021 CLINICAL DATA:  Aortic aneurysm known or suspected. Sent to ED for hypertension stressful day today. Has not had blood pressure meds today. Centralized chest pain for the past 3 days. EXAM: CT ANGIOGRAPHY CHEST, ABDOMEN AND PELVIS TECHNIQUE:  Non-contrast CT of the chest was initially obtained. Multidetector CT imaging through the chest, abdomen and pelvis was performed using the standard protocol during bolus administration of intravenous contrast. Multiplanar reconstructed images and MIPs were obtained and reviewed to evaluate the vascular anatomy. RADIATION DOSE REDUCTION: This exam was performed according to the departmental dose-optimization program which includes automated exposure control, adjustment of the mA and/or kV according to patient size and/or use of iterative reconstruction technique. CONTRAST:  164mL OMNIPAQUE IOHEXOL 350 MG/ML SOLN COMPARISON:  Chest two views 11/07/2021 CT abdomen and pelvis 04/26/2020, CTA chest abdomen and pelvis 04/09/2019 FINDINGS: CTA CHEST FINDINGS Cardiovascular: Heart size is normal. Coronary artery and aortic root calcifications are seen. No pericardial effusion. No pulmonary embolism is seen. Mediastinum/Nodes: No pathologically enlarged axillary, mediastinal, or hilar lymph nodes by CT criteria. The visualized thyroid is unremarkable. The esophagus follows a normal course of normal caliber. Lungs/Pleura: The central airways are patent. No pleural effusion or pneumothorax. Minimal superior paraseptal emphysematous changes. No focal airspace opacity. Mild curvilinear subsegmental atelectasis within the superior lingula. Musculoskeletal: Mild-to-moderate multilevel degenerative disc changes of the midthoracic spine the Review of the MIP images confirms the above findings. CTA ABDOMEN AND PELVIS FINDINGS VASCULAR Aorta: The ascending aorta measures up to 3.2 cm in caliber, not aneurysmal. No intramural hyperdensity is seen on precontrast images within the thoracic aorta to indicate an intramural hematoma. No thoracic aortic dissection. There is a common origin of the right brachiocephalic artery and the left common carotid artery from the aortic arch, a normal variant. There is infrarenal aortoiliac stent  graft. No abdominal aortic dissection. Celiac: Unchanged slight narrowing of the common iliac artery with distal patency. SMA: Patent. Renals: Patent. IMA: Not definitely visualized and possibly occluded by the stent graft. Inflow: Unchanged moderate narrowing at the origins of both internal iliac vessels. Normal flow enhancement within the external iliac arteries. The common femoral arteries are also patent. Veins: No obvious venous abnormality within the limitations of this arterial phase study. Review of the MIP images confirms the above findings. NON-VASCULAR Hepatobiliary: Smooth liver contours. The gallbladder is grossly unremarkable. Pancreas: Unremarkable. No pancreatic ductal dilatation or surrounding inflammatory changes. Spleen: Normal in size without focal abnormality. Adrenals/Urinary Tract: Normal adrenals. The kidneys enhance uniformly are symmetric in size without hydronephrosis. The urinary bladder is decompressed, limiting evaluation. Stomach/Bowel: Moderate stool is seen throughout the colon. The terminal ileum is unremarkable. Normal appendix. No dilated loops of bowel to indicate bowel obstruction.r Lymphatic: No abdominopelvic lymphadenopathy. Reproductive: Uterus and bilateral adnexa are unremarkable. Other: Fat containing umbilical hernia with orifice measuring up to 3.3 cm in transverse dimension. Musculoskeletal: No acute or significant osseous findings. Review of the MIP images confirms the above findings. IMPRESSION: 1. No thoracic or abdominal  aortic aneurysm or dissection. Status post aortoiliac stent graft which is patent. 2. No acute abnormality is seen within the abdomen or pelvis. Electronically Signed   By: Yvonne Kendall M.D.   On: 11/07/2021 13:34      PROCEDURES:  Critical Care performed: No  .1-3 Lead EKG Interpretation Performed by: Naaman Plummer, MD Authorized by: Naaman Plummer, MD     Interpretation: normal     ECG rate:  78   ECG rate assessment: normal      Rhythm: sinus rhythm     Ectopy: none     Conduction: normal     MEDICATIONS ORDERED IN ED: Medications  metoprolol succinate (TOPROL-XL) 24 hr tablet 50 mg (50 mg Oral Given 11/07/21 1233)  labetalol (NORMODYNE) injection 20 mg (0 mg Intravenous Hold 11/07/21 1519)  amLODipine (NORVASC) tablet 10 mg (10 mg Oral Given 11/07/21 1233)  clopidogrel (PLAVIX) tablet 75 mg (75 mg Oral Given 11/07/21 1233)  losartan (COZAAR) tablet 100 mg (100 mg Oral Given 11/07/21 1233)  cloNIDine (CATAPRES) tablet 0.1 mg (0.1 mg Oral Given 11/07/21 1233)  iohexol (OMNIPAQUE) 350 MG/ML injection 100 mL (100 mLs Intravenous Contrast Given 11/07/21 1304)     IMPRESSION / MDM / ASSESSMENT AND PLAN / ED COURSE  I reviewed the triage vital signs and the nursing notes.                              The patient is on the cardiac monitor to evaluate for evidence of arrhythmia and/or significant heart rate changes. Workup: ECG, CXR, CBC, BMP, Troponin Findings: ECG: No overt evidence of STEMI. No evidence of Brugadas sign, delta wave, epsilon wave, significantly prolonged QTc, or malignant arrhythmia HS Troponin: Negative x1 Other Labs unremarkable for emergent problems. CXR: Without PTX, PNA, or widened mediastinum CTA: No acute aneurysm or dissection Last Stress Test:  2018 Last Heart Catheterization:  2018 HEART Score: 3  Given History, Exam, and Workup I have low suspicion for ACS, Pneumothorax, Pneumonia, Pulmonary Embolus, Tamponade, Aortic Dissection or other emergent problem as a cause for this presentation.   Reassesment: Prior to discharge patients pain was controlled and they were well appearing.  Disposition:  Discharge. Strict return precautions discussed with patient with full understanding. Advised patient to follow up promptly with primary care provider        FINAL CLINICAL IMPRESSION(S) / ED DIAGNOSES   Final diagnoses:  Chest pain, unspecified type  Primary hypertension  Hx of  gastroesophageal reflux (GERD)     Rx / DC Orders   ED Discharge Orders     None        Note:  This document was prepared using Dragon voice recognition software and may include unintentional dictation errors.   Naaman Plummer, MD 11/07/21 [phone]

## 2021-11-07 NOTE — ED Triage Notes (Signed)
Pt to ED for HTN sent from doctor while having blood work. States stressful day today, has not had bp meds yet today. Reports centralized chest pain constant for past 3 days.  Denies n/v/shob Ambulatory, Nad noted

## 2021-12-12 ENCOUNTER — Other Ambulatory Visit: Payer: Self-pay | Admitting: Physician Assistant

## 2021-12-12 DIAGNOSIS — Z1231 Encounter for screening mammogram for malignant neoplasm of breast: Secondary | ICD-10-CM

## 2022-01-03 ENCOUNTER — Ambulatory Visit (INDEPENDENT_AMBULATORY_CARE_PROVIDER_SITE_OTHER): Payer: BLUE CROSS/BLUE SHIELD | Admitting: Vascular Surgery

## 2022-01-03 ENCOUNTER — Encounter (INDEPENDENT_AMBULATORY_CARE_PROVIDER_SITE_OTHER): Payer: Self-pay | Admitting: Vascular Surgery

## 2022-01-03 ENCOUNTER — Ambulatory Visit (INDEPENDENT_AMBULATORY_CARE_PROVIDER_SITE_OTHER): Payer: BLUE CROSS/BLUE SHIELD

## 2022-01-03 ENCOUNTER — Other Ambulatory Visit: Payer: Self-pay

## 2022-01-03 DIAGNOSIS — I714 Abdominal aortic aneurysm, without rupture, unspecified: Secondary | ICD-10-CM | POA: Diagnosis not present

## 2022-01-03 DIAGNOSIS — I739 Peripheral vascular disease, unspecified: Secondary | ICD-10-CM

## 2022-01-07 ENCOUNTER — Telehealth (INDEPENDENT_AMBULATORY_CARE_PROVIDER_SITE_OTHER): Payer: BLUE CROSS/BLUE SHIELD | Admitting: Cardiovascular Disease

## 2022-01-07 ENCOUNTER — Other Ambulatory Visit: Payer: Self-pay

## 2022-01-07 ENCOUNTER — Encounter: Payer: Self-pay | Admitting: Cardiovascular Disease

## 2022-01-07 VITALS — BP 200/120 | HR 70 | Ht 66.0 in | Wt 182.0 lb

## 2022-01-07 DIAGNOSIS — I10 Essential (primary) hypertension: Secondary | ICD-10-CM | POA: Diagnosis not present

## 2022-01-07 DIAGNOSIS — E1159 Type 2 diabetes mellitus with other circulatory complications: Secondary | ICD-10-CM

## 2022-01-07 DIAGNOSIS — I714 Abdominal aortic aneurysm, without rupture, unspecified: Secondary | ICD-10-CM

## 2022-01-07 DIAGNOSIS — I25118 Atherosclerotic heart disease of native coronary artery with other forms of angina pectoris: Secondary | ICD-10-CM | POA: Diagnosis not present

## 2022-01-07 DIAGNOSIS — I70213 Atherosclerosis of native arteries of extremities with intermittent claudication, bilateral legs: Secondary | ICD-10-CM | POA: Diagnosis not present

## 2022-01-07 DIAGNOSIS — E782 Mixed hyperlipidemia: Secondary | ICD-10-CM

## 2022-01-07 MED ORDER — NITROGLYCERIN 0.4 MG SL SUBL: 0.4000 mg | SUBLINGUAL_TABLET | SUBLINGUAL | 3 refills | Status: DC | PRN

## 2022-01-07 MED ORDER — CLONIDINE HCL 0.2 MG PO TABS: 0.2000 mg | ORAL_TABLET | Freq: Two times a day (BID) | ORAL | 3 refills | Status: DC

## 2022-01-07 NOTE — Progress Notes (Signed)
?  ?  ? ?Virtual Visit via Video Note  ? ?This visit type was conducted due to national recommendations for restrictions regarding the COVID-19 Pandemic (e.g. social distancing) in an effort to limit this patient's exposure and mitigate transmission in our community.  Due to her co-morbid illnesses, this patient is at least at moderate risk for complications without adequate follow up.  This format is felt to be most appropriate for this patient at this time.  All issues noted in this document were discussed and addressed.  A limited physical exam was performed with this format.  Please refer to the patient's chart for her consent to telehealth for [facility]. ? ? ?I connected with  Heather Mahoney on 01/07/22 by a video enabled telemedicine application and verified that I am speaking with the correct person using two identifiers. ?I am contacting the patient above from our cardiology clinic office or alternate office work station to their home, ?I discussed the limitations of evaluation and management by telemedicine. The patient expressed understanding and agreed to proceed.  ? ?Evaluation Performed:  Follow-up visit ? ?Date:  01/07/2022  ? ?ID:  Heather Mahoney, DOB [birth year], MRN [id number] ? ?Patient Location:  ?[address] ?[place] [address]  ? ?Provider location:   ?[place], [employer] office ? ?PCP:  Macarthur Critchley, MD  ?Cardiologist:  [facility] ? ? ?Chief Complaint  ?Patient presents with  ? Chest pain   ?  Patient c/o chest pain/pressure & shortness of breath with walking since Jan. 2023 that comes and goes. Medications reviewed by the patient verbally.   ? ? ?History of Present Illness:   ? ?Heather Mahoney is a 56 y.o. female who presents via Engineer, civil (consulting) for a telehealth visit today.   ?The patient does not symptoms concerning for COVID-19 infection (fever, chills, cough, or new SHORTNESS OF BREATH).  ? ?Patient has a past medical history of ? 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 ?Last seen in clinic September 2022 ? ?having sinus issues/congestion ? ?Works at [employer], watches monitors  ?Reports ?Stopped smoking >3 years ago ?  ?GERD symptoms stable ?  ?Chronic claudication pain, ?Followed by Dr. Lucky Cowboy ?Neuropathy, chronic pain in her feet ? ?Did not take her blood pressure medications this morning, systolic pressure of 191 ?She is sitting with nursing at her workstation ? ?Tolerating Crestor and Zetia ?Numbers did not get to goal on Lipitor ?Lab work has been requested from primary care ?No recent lipid panel in the system ?  ?Discussed prior cardiac catheterization, 60% lesion mid RCA ?  ?Other past medical history reviewed ?cardiac CTA showing stenosis in RCA by FFR  ? cardiac catheterization January 13, 2019 ?  ?Cardiac cath 01/13/2019 ?Single-vessel disease notably of the RCA moderate to severe proximal  disease estimated 75% and distal RCA disease estimated at 80% ?There is disease of high OM vessel, small in caliber diffusely diseased ?Mild proximal LAD disease ?  ?--- stenting of the proximal RCA and distal RCA ? subsequent left groin hematoma causing hypotension, vagal response ?Was kept for 2 days post procedure in the hospital ?Her ?  ?Medication  intolerances  ?isosorbide as this caused a headache ?  ? peripheral vascular catheterization in October 2019  ?AAA repair, endograft ?  ?She reports strong family history of coronary disease ?Mother with bypass surgery ?Her grandmother, mother side, passed away at the age of 27 from a brain aneurysm.  ?  ?Cardiac CTA ?cardiac CT scan was ordered showing severe stenosis mid RCA (FFR  less than 0.7), indeterminate stenosis LAD and circumflex (FFR less than 0.8) ?  ? ?Past Medical History:  ?Diagnosis Date  ? CAD (coronary artery disease)   ? a. PCI to proximal and distal RCA 3/26  ? GERD (gastroesophageal reflux disease)   ? Heart murmur   ? MR  ? Hiatal hernia   ? Hyperlipidemia   ? Hypertension   ? Tobacco abuse   ? a. Started at age 60, quit during 3 pregnancies. b. 1 PPD for a 40 pack-year hx (2019)   ? ?Past Surgical History:  ?Procedure Laterality Date  ? BREAST BIOPSY Right   ? Multiple biopsies-all benign  ? BREAST SURGERY  1986  ? I&D for 'milk duct'  ? CORONARY STENT INTERVENTION N/A 01/13/2019  ? Procedure: CORONARY STENT INTERVENTION;  Surgeon: Nelva Bush, MD;  Location: [facility];  Service: Cardiovascular;  Laterality: N/A;  ? EMBOLECTOMY  08/13/2018  ? Procedure: Right SFA and profunda femoris Fogarty embolectomy 3  Fogarty embolectomy balloon;  Surgeon: Algernon Huxley, MD;  Location: [facility];  Service: Vascular;;  ? ENDARTERECTOMY FEMORAL Right 08/13/2018  ? Procedure: Right common femoral, profunda femoris, and superficial femoral artery endarterectomies and patch angioplasty ;  Surgeon: Algernon Huxley, MD;  Location: [facility];  Service: Vascular;  Laterality: Right;  ? ENDOVASCULAR REPAIR/STENT GRAFT N/A 08/13/2018  ? Procedure: ENDOVASCULAR REPAIR/STENT GRAFT;  Surgeon: Katha Cabal, MD;  Location: [facility];  Service: Cardiovascular;  Laterality: N/A;  ? LEFT HEART CATH AND CORONARY ANGIOGRAPHY Left 01/13/2019  ? Procedure: LEFT HEART CATH AND CORONARY ANGIOGRAPHY;  Surgeon: Minna Merritts, MD;  Location: [facility];  Service: Cardiovascular;  Laterality: Left;  ? SALPINGOOPHORECTOMY Left 2000  ?  ? ? ?Allergies:   Strawberry extract and Strawberry flavor  ? ?Social History  ? ?Tobacco Use  ? Smoking status: Former  ?  Packs/day: 0.25  ?  Years: 38.00  ?  Pack years: 9.50  ?  Types: Cigarettes  ?  Quit date: 08/10/2018  ?  Years since  quitting: 3.4  ? Smokeless tobacco: Never  ?Vaping Use  ? Vaping Use: Never used  ?Substance Use Topics  ? Alcohol use: Yes  ?  Comment: occassional  ? Drug use: Not Currently  ?  Frequency: 2.0 times per week  ?  Types: Marijuana  ?  Comment: smokes Marijuana 'when I cant sleep, maybe once or twice a week'  ?  ? ?Current Outpatient Medications on File Prior to Visit  ?Medication Sig Dispense Refill  ? amLODipine (NORVASC) 10 MG tablet Take 1 tablet by mouth once daily 30 tablet 6  ? aspirin 81 MG EC tablet Take 1 tablet (81 mg total) by mouth daily. 90 tablet 3  ? cholecalciferol (VITAMIN D3) 25 MCG (1000 UNIT) tablet Take 50,000 Units by mouth once a week.    ? cloNIDine (CATAPRES) 0.1 MG tablet Take 0.1 mg by mouth in the morning and at bedtime. Take an extra 0.1 mg for BP >160    ? cyclobenzaprine (FLEXERIL) 10 MG tablet Take 10  mg by mouth 3 (three) times daily.    ? empagliflozin (JARDIANCE) 10 MG TABS tablet Take by mouth daily.    ? esomeprazole (NEXIUM) 40 MG capsule Take 40 mg by mouth daily.    ? ezetimibe (ZETIA) 10 MG tablet Take 1 tablet (10 mg total) by mouth daily. 90 tablet 4  ? gabapentin (NEURONTIN) 600 MG tablet Take 600 mg by mouth 3 (three) times daily as needed.    ? losartan (COZAAR) 100 MG tablet Take 1 tablet by mouth once daily 30 tablet 6  ? meloxicam (MOBIC) 7.5 MG tablet Take 7.5 mg by mouth 2 (two) times daily.    ? methocarbamol (ROBAXIN) 500 MG tablet Take 1,000 mg by mouth 4 (four) times daily.    ? metoprolol succinate (TOPROL-XL) 50 MG 24 hr tablet Take 50 mg by mouth daily. Take with or immediately following a meal.    ? nitroGLYCERIN (NITROSTAT) 0.4 MG SL tablet Place 1 tablet (0.4 mg total) under the tongue every 5 (five) minutes as needed for chest pain. 25 tablet 3  ? omeprazole (PRILOSEC) 20 MG capsule Take 1 capsule (20 mg total) by mouth 2 (two) times daily before a meal. 60 capsule 1  ? PLAVIX 75 MG tablet Take 1 tablet (75 mg total) by mouth daily. 30 tablet 6  ?  rosuvastatin (CRESTOR) 40 MG tablet Take 1 tablet (40 mg total) by mouth daily. 90 tablet 3  ? sucralfate (CARAFATE) 1 g tablet Take 1 tablet (1 g total) by mouth 4 (four) times daily -  with meals and at bedti

## 2022-01-07 NOTE — Patient Instructions (Addendum)
Labs from [place] (pmd) ? ?Medication Instructions:  ?Please increase the clonidine up to 0.2 twice a day ? ?If you need a refill on your cardiac medications before your next appointment, please call your pharmacy.  ? ?Lab work: ?No new labs needed ? ?Testing/Procedures: ?No new testing needed ? ?Follow-Up: ?At [facility], you and your health needs are our priority.  As part of our continuing mission to provide you with exceptional heart care, we have created designated Provider Care Teams.  These Care Teams include your primary Cardiologist (physician) and Advanced Practice Providers (APPs -  Physician Assistants and Nurse Practitioners) who all work together to provide you with the care you need, when you need it. ? ?You will need a follow up appointment in 12 months ? ?Providers on your designated Care Team:   ?Murray Hodgkins, NP ?Christell Faith, PA-C ?Cadence Kathlen Mody, PA-C ? ?COVID-19 Vaccine Information can be found at: [email or web address] For questions related to vaccine distribution or appointments, please email [email or web address] or call [phone].  ? ?

## 2022-02-24 ENCOUNTER — Emergency Department: Payer: BLUE CROSS/BLUE SHIELD

## 2022-02-24 ENCOUNTER — Other Ambulatory Visit: Payer: Self-pay

## 2022-02-24 ENCOUNTER — Emergency Department
Admission: EM | Admit: 2022-02-24 | Discharge: 2022-02-24 | Disposition: A | Discharge status: Home / Self Care | Payer: BLUE CROSS/BLUE SHIELD | Attending: Emergency Medicine | Admitting: Emergency Medicine

## 2022-02-24 DIAGNOSIS — R519 Headache, unspecified: Secondary | ICD-10-CM | POA: Diagnosis present

## 2022-02-24 DIAGNOSIS — I2510 Atherosclerotic heart disease of native coronary artery without angina pectoris: Secondary | ICD-10-CM | POA: Diagnosis not present

## 2022-02-24 DIAGNOSIS — J0100 Acute maxillary sinusitis, unspecified: Secondary | ICD-10-CM | POA: Insufficient documentation

## 2022-02-24 DIAGNOSIS — I10 Essential (primary) hypertension: Secondary | ICD-10-CM | POA: Diagnosis not present

## 2022-02-24 DIAGNOSIS — D72829 Elevated white blood cell count, unspecified: Secondary | ICD-10-CM | POA: Insufficient documentation

## 2022-02-24 DIAGNOSIS — R112 Nausea with vomiting, unspecified: Secondary | ICD-10-CM | POA: Insufficient documentation

## 2022-02-24 DIAGNOSIS — Z87891 Personal history of nicotine dependence: Secondary | ICD-10-CM | POA: Diagnosis not present

## 2022-02-24 LAB — COMPREHENSIVE METABOLIC PANEL
ALT: 22 U/L (ref 0–44)
AST: 25 U/L (ref 15–41)
Albumin: 4.7 g/dL (ref 3.5–5.0)
Alkaline Phosphatase: 99 U/L (ref 38–126)
Anion gap: 8 (ref 5–15)
BUN: 9 mg/dL (ref 6–20)
CO2: 27 mmol/L (ref 22–32)
Calcium: 9.7 mg/dL (ref 8.9–10.3)
Chloride: 102 mmol/L (ref 98–111)
Creatinine, Ser: 0.77 mg/dL (ref 0.44–1.00)
GFR, Estimated: >60 mL/min (ref >60)
Glucose, Bld: 155 mg/dL — ABNORMAL HIGH (ref 70–99)
Potassium: 3.7 mmol/L (ref 3.5–5.1)
Sodium: 137 mmol/L (ref 135–145)
Total Bilirubin: 0.8 mg/dL (ref 0.3–1.2)
Total Protein: 8.5 g/dL — ABNORMAL HIGH (ref 6.5–8.1)

## 2022-02-24 LAB — URINALYSIS, ROUTINE W REFLEX MICROSCOPIC
Bilirubin Urine: NEGATIVE
Glucose, UA: NEGATIVE mg/dL
Ketones, ur: NEGATIVE mg/dL
Leukocytes,Ua: NEGATIVE
Nitrite: NEGATIVE
Protein, ur: NEGATIVE mg/dL
Specific Gravity, Urine: 1.036 — ABNORMAL HIGH (ref 1.005–1.030)
pH: 6 (ref 5.0–8.0)

## 2022-02-24 LAB — CBC
HCT: 37.9 % (ref 36.0–46.0)
Hemoglobin: 12.2 g/dL (ref 12.0–15.0)
MCH: 26.2 pg (ref 26.0–34.0)
MCHC: 32.2 g/dL (ref 30.0–36.0)
MCV: 81.5 fL (ref 80.0–100.0)
Platelets: 469 10*3/uL — ABNORMAL HIGH (ref 150–400)
RBC: 4.65 MIL/uL (ref 3.87–5.11)
RDW: 15.9 % — ABNORMAL HIGH (ref 11.5–15.5)
WBC: 13.4 10*3/uL — ABNORMAL HIGH (ref 4.0–10.5)
nRBC: 0 % (ref 0.0–0.2)

## 2022-02-24 LAB — POC URINE PREG, ED: Preg Test, Ur: NEGATIVE

## 2022-02-24 LAB — LIPASE, BLOOD: Lipase: 30 U/L (ref 11–51)

## 2022-02-24 MED ORDER — ONDANSETRON HCL 4 MG/2ML IJ SOLN
4.0000 mg | Freq: Once | INTRAMUSCULAR | Status: AC
  Administered 2022-02-24: 4 mg via INTRAVENOUS
  Filled 2022-02-24: qty 2

## 2022-02-24 MED ORDER — KETOROLAC TROMETHAMINE 30 MG/ML IJ SOLN
15.0000 mg | Freq: Once | INTRAMUSCULAR | Status: AC
  Administered 2022-02-24: 15 mg via INTRAVENOUS
  Filled 2022-02-24: qty 1

## 2022-02-24 MED ORDER — LACTATED RINGERS IV BOLUS
1000.0000 mL | Freq: Once | INTRAVENOUS | Status: AC
  Administered 2022-02-24: 1000 mL via INTRAVENOUS

## 2022-02-24 MED ORDER — MORPHINE SULFATE (PF) 4 MG/ML IV SOLN
4.0000 mg | Freq: Once | INTRAVENOUS | Status: AC
  Administered 2022-02-24: 4 mg via INTRAVENOUS
  Filled 2022-02-24: qty 1

## 2022-02-24 MED ORDER — IOHEXOL 350 MG/ML SOLN
75.0000 mL | Freq: Once | INTRAVENOUS | Status: AC | PRN
  Administered 2022-02-24: 75 mL via INTRAVENOUS
  Filled 2022-02-24: qty 75

## 2022-02-24 NOTE — ED Provider Notes (Signed)
? ?[facility] ?Provider Note ? ? ? Event Date/Time  ? First MD Initiated Contact with Patient 02/24/22 1211   ?  (approximate) ? ? ?History  ? ?Emesis ? ? ?HPI ? ?Heather Mahoney is a 56 y.o. female who presents to the ED for evaluation of Emesis ?  ?Hx CAD s/p stenting to RCA 2020, AAA with endovascular repair 2019.  Previous smoker, HTN.  ? ?Patient presents to the ED for evaluation of headache, emesis and sinus congestion.  She reports explicit concern for a bad sinus infection as she feels congested to her bilateral maxillary sinuses.  Symptoms have been present for the past 3 to 4 days.  Reports gradual onset severe global bifrontal headache without sudden onset or syncope.  Felt better over the weekend until last night when it "returned with a vengeance" with severe headache and recurrent emesis of nonbloody nonbilious emesis.  Denies any abdominal pain, chest pain. ? ? ?Physical Exam  ? ?Triage Vital Signs: ?ED Triage Vitals [02/24/22 1134]  ?Enc Vitals Group  ?   BP (!) 165/117  ?   Pulse Rate (!) 115  ?   Resp 20  ?   Temp 99.2 ?F (37.3 ?C)  ?   Temp src   ?   SpO2 97 %  ?   Weight 180 lb (81.6 kg)  ?   Height '5\' 6"'$  (1.676 m)  ?   Head Circumference   ?   Peak Flow   ?   Pain Score 10  ?   Pain Loc   ?   Pain Edu?   ?   Excl. in [place]?   ? ? ?Most recent vital signs: ?Vitals:  ? 02/24/22 1134 02/24/22 1422  ?BP: (!) 165/117 (!) 153/90  ?Pulse: (!) 115 84  ?Resp: 20 16  ?Temp: 99.2 ?F (37.3 ?C)   ?SpO2: 97% 99%  ? ? ?General: Awake, no distress.  Seems congested, frequently sniffling.  No meningismus.  Mild tenderness to bilateral maxillary processes without overlying skin changes or signs of trauma.  No external swelling.  No signs of EOM entrapment. ?Dry mucous membranes. ?CV:  Good peripheral perfusion.  Tachycardic and regular ?Resp:  Normal effort.  ?Abd:  No distention.  Minimal tenderness to the epigastrium. ?MSK:  No deformity noted.  ?Neuro:  No focal deficits appreciated. Cranial  nerves II through XII intact ?5/5 strength and sensation in all 4 extremities ?Other:   ? ? ?ED Results / Procedures / Treatments  ? ?Labs ?(all labs ordered are listed, but only abnormal results are displayed) ?Labs Reviewed  ?COMPREHENSIVE METABOLIC PANEL - Abnormal; Notable for the following components:  ?    Result Value  ? Glucose, Bld 155 (*)   ? Total Protein 8.5 (*)   ? All other components within normal limits  ?CBC - Abnormal; Notable for the following components:  ? WBC 13.4 (*)   ? RDW 15.9 (*)   ? Platelets 469 (*)   ? All other components within normal limits  ?URINALYSIS, ROUTINE W REFLEX MICROSCOPIC - Abnormal; Notable for the following components:  ? Color, Urine YELLOW (*)   ? APPearance CLEAR (*)   ? Specific Gravity, Urine 1.036 (*)   ? Hgb urine dipstick MODERATE (*)   ? Bacteria, UA RARE (*)   ? All other components within normal limits  ?LIPASE, BLOOD  ?POC URINE PREG, ED  ? ? ?EKG ? ? ?RADIOLOGY ?CT abdomen/pelvis reviewed by me without  evidence of acute pathology.  CT head reviewed by me without evidence of ICH or thrombosis ? ?Official radiology report(s): ?CT ABDOMEN PELVIS W CONTRAST ? ?Result Date: 02/24/2022 ?CLINICAL DATA:  Vomiting, upper abdominal pain and chills. EXAM: CT ABDOMEN AND PELVIS WITH CONTRAST TECHNIQUE: Multidetector CT imaging of the abdomen and pelvis was performed using the standard protocol following bolus administration of intravenous contrast. RADIATION DOSE REDUCTION: This exam was performed according to the departmental dose-optimization program which includes automated exposure control, adjustment of the mA and/or kV according to patient size and/or use of iterative reconstruction technique. CONTRAST:  13m OMNIPAQUE IOHEXOL 350 MG/ML SOLN COMPARISON:  CTA of the chest, abdomen and pelvis on 11/07/2021 FINDINGS: Lower chest: No acute abnormality. Hepatobiliary: Normal appearance of liver and bile ducts. Mildly increased density within a nondistended gallbladder  may relate to subtle gallstones which are not discretely visualized by CT. Pancreas: Unremarkable. No pancreatic ductal dilatation or surrounding inflammatory changes. Spleen: No splenic injury or perisplenic hematoma. Adrenals/Urinary Tract: Adrenal glands are unremarkable. Kidneys are normal, without renal calculi, focal lesion, or hydronephrosis. Bladder is unremarkable. Stomach/Bowel: Bowel shows no evidence of obstruction, ileus, inflammation or lesion. The appendix is normal. No free intraperitoneal air. Vascular/Lymphatic: Status post EVAR with patent endograft. The aortic sac is again noted to be completely shrunken around the endograft with no residual aneurysm present. No evidence endoleak on the venous phase of imaging. No enlarged lymph nodes identified. Reproductive: Uterus and bilateral adnexa are unremarkable. Other: Small midline ventral hernia defect in the abdominal wall just superior to the umbilicus contains fat. No abdominopelvic ascites. Musculoskeletal: No acute or significant osseous findings. IMPRESSION: 1. No significant acute findings in the abdomen or pelvis. 2. Mildly increased density within a nondistended gallbladder may represent subtle gallstones. This could be potentially confirmed by ultrasound. 3. Normally patent endograft status post EVAR with no evidence of residual abdominal aortic aneurysm. 4. Small midline supraumbilical ventral hernia containing fat. Electronically Signed   By: GAletta EdouardM.D.   On: 02/24/2022 14:35  ? ?CT VENOGRAM HEAD ? ?Result Date: 02/24/2022 ?CLINICAL DATA:  vasculopath with severe global headache, emesis, blurring vision. eval thrombosis EXAM: CT VENOGRAM HEAD TECHNIQUE: Venographic phase images of the brain were obtained following the administration of intravenous contrast. Multiplanar reformats and maximum intensity projections were generated. RADIATION DOSE REDUCTION: This exam was performed according to the departmental dose-optimization program  which includes automated exposure control, adjustment of the mA and/or kV according to patient size and/or use of iterative reconstruction technique. CONTRAST:  7109mOMNIPAQUE IOHEXOL 350 MG/ML SOLN COMPARISON:  None Available. FINDINGS: No evidence of acute large vascular territory infarct, acute hemorrhage, mass lesion, midline shift, or hydrocephalus. No visible extra-axial fluid collection. Partially empty sella. On postcontrast imaging, no evidence of dural venous sinus thrombosis. The superior sagittal, transverse, sigmoid, and straight sinuses are patent. Patent jugular bulbs. Benign arachnoid granulations, largest in the left transverse sinus. Visualized deep cerebral veins are patent. Symmetric opacification of the cavernous sinuses. IMPRESSION: 1. No evidence of acute intracranial abnormality or dural venous sinus thrombosis. 2. Partially empty sella, which is often a normal anatomic variant but can be associated with idiopathic intracranial hypertension. Electronically Signed   By: FrMargaretha Sheffield.D.   On: 02/24/2022 14:12   ? ?PROCEDURES and INTERVENTIONS: ? ?Procedures ? ?Medications  ?lactated ringers bolus 1,000 mL (0 mLs Intravenous Stopped 02/24/22 1504)  ?ondansetron (ZOFRAN) injection 4 mg (4 mg Intravenous Given 02/24/22 1315)  ?morphine (PF) 4 MG/ML injection 4  mg (4 mg Intravenous Given 02/24/22 1316)  ?iohexol (OMNIPAQUE) 350 MG/ML injection 75 mL (75 mLs Intravenous Contrast Given 02/24/22 1340)  ?ketorolac (TORADOL) 30 MG/ML injection 15 mg (15 mg Intravenous Given 02/24/22 1502)  ? ? ? ?IMPRESSION / MDM / ASSESSMENT AND PLAN / ED COURSE  ?I reviewed the triage vital signs and the nursing notes. ? 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  Seems uncomfortable initially, improving dramatically with fluids, Toradol and  antiemetics.  Blood work with mild nonspecific leukocytosis, normal metabolic panel.  Noninfectious urine.  No evidence of pancreatitis.  Due to her vasculopathic nature, CT venogram head is performed without

## 2022-02-24 NOTE — ED Notes (Signed)
Not feeling well all weekend--sinus pressure around eyes.  Some coughing.  Says now she is vomiting.  Says she was unable to take her bp med this am because she couldn't keep it down.  She is unable to urinate but aware she needs specimen.  Says no stomach pain.  Unsure if she has had fever but has felt cold.  No body aches, just head.   ?

## 2022-02-24 NOTE — ED Triage Notes (Signed)
Pt states thinks has bad sinus infection. Has had chills, emesis and headache since 0200 today.  ?States has not taken any meds.  ?

## 2022-04-04 ENCOUNTER — Other Ambulatory Visit: Payer: Self-pay | Admitting: Physician Assistant

## 2022-04-04 DIAGNOSIS — Z1231 Encounter for screening mammogram for malignant neoplasm of breast: Secondary | ICD-10-CM

## 2022-05-30 ENCOUNTER — Ambulatory Visit
Admission: RE | Admit: 2022-05-30 | Discharge: 2022-05-30 | Disposition: A | Discharge status: Home / Self Care | Payer: 59 | Source: Ambulatory Visit | Attending: Physician Assistant | Admitting: Physician Assistant

## 2022-05-30 DIAGNOSIS — Z1231 Encounter for screening mammogram for malignant neoplasm of breast: Secondary | ICD-10-CM | POA: Diagnosis present

## 2022-06-03 ENCOUNTER — Inpatient Hospital Stay
Admission: RE | Admit: 2022-06-03 | Discharge: 2022-06-03 | Disposition: A | Discharge status: Home / Self Care | Payer: Self-pay | Source: Ambulatory Visit | Attending: *Deleted | Admitting: *Deleted

## 2022-06-03 ENCOUNTER — Other Ambulatory Visit: Payer: Self-pay | Admitting: *Deleted

## 2022-06-03 DIAGNOSIS — Z1231 Encounter for screening mammogram for malignant neoplasm of breast: Secondary | ICD-10-CM

## 2022-06-27 ENCOUNTER — Other Ambulatory Visit: Payer: Self-pay

## 2022-06-27 ENCOUNTER — Telehealth: Payer: Self-pay

## 2022-06-27 DIAGNOSIS — Z1211 Encounter for screening for malignant neoplasm of colon: Secondary | ICD-10-CM

## 2022-06-27 MED ORDER — NA SULFATE-K SULFATE-MG SULF 17.5-3.13-1.6 GM/177ML PO SOLN: 1.0000 | Freq: Once | ORAL | 0 refills | Status: AC

## 2022-06-27 NOTE — Telephone Encounter (Signed)
Gastroenterology Pre-Procedure Review  Request Date: 07/07/22 Requesting Physician: Dr. Vicente Males  PATIENT REVIEW QUESTIONS: The patient responded to the following health history questions as indicated:    1. Are you having any GI issues? no 2. Do you have a personal history of Polyps? no 3. Do you have a family history of Colon Cancer or Polyps?yes (mother had colon resection in her late 30tys she had polyps) 4. Diabetes Mellitus? yes (takes jardiance has been advised to hold it 3 days) 5. Joint replacements in the past 12 months?no 6. Major health problems in the past 3 months?no 7. Any artificial heart valves, MVP, or defibrillator?no    MEDICATIONS & ALLERGIES:    Patient reports the following regarding taking any anticoagulation/antiplatelet therapy:   Plavix, Coumadin, Eliquis, Xarelto, Lovenox, Pradaxa, Brilinta, or Effient? yes (Plavix prescribed by Dr. Rockey Situ) Aspirin? yes ('81mg'$  daily)  Patient confirms/reports the following medications:  Current Outpatient Medications  Medication Sig Dispense Refill   amLODipine (NORVASC) 10 MG tablet Take 1 tablet by mouth once daily 30 tablet 6   aspirin 81 MG EC tablet Take 1 tablet (81 mg total) by mouth daily. 90 tablet 3   cholecalciferol (VITAMIN D3) 25 MCG (1000 UNIT) tablet Take 50,000 Units by mouth once a week.     cloNIDine (CATAPRES) 0.2 MG tablet Take 1 tablet (0.2 mg total) by mouth in the morning and at bedtime. Take an extra 0.1 mg for BP >160 180 tablet 3   cyclobenzaprine (FLEXERIL) 10 MG tablet Take 10 mg by mouth 3 (three) times daily.     D3-50 1.25 MG (50000 UT) capsule Take 50,000 Units by mouth once a week. (Patient not taking: Reported on 01/07/2022)     docusate sodium (COLACE) 100 MG capsule Take 1 capsule (100 mg total) by mouth daily. (Patient not taking: Reported on 07/09/2021) 10 capsule 0   empagliflozin (JARDIANCE) 10 MG TABS tablet Take by mouth daily.     esomeprazole (NEXIUM) 40 MG capsule Take 40 mg by mouth  daily.     ezetimibe (ZETIA) 10 MG tablet Take 1 tablet (10 mg total) by mouth daily. 90 tablet 4   famotidine (PEPCID) 40 MG tablet Take by mouth daily. (Patient not taking: Reported on 07/09/2021)     gabapentin (NEURONTIN) 600 MG tablet Take 600 mg by mouth 3 (three) times daily as needed.     hydrOXYzine (ATARAX/VISTARIL) 25 MG tablet Take 25 mg by mouth every 6 (six) hours as needed. (Patient not taking: Reported on 07/09/2021)     losartan (COZAAR) 100 MG tablet Take 1 tablet by mouth once daily 30 tablet 6   meloxicam (MOBIC) 7.5 MG tablet Take 7.5 mg by mouth 2 (two) times daily.     methocarbamol (ROBAXIN) 500 MG tablet Take 1,000 mg by mouth 4 (four) times daily.     metoprolol succinate (TOPROL-XL) 50 MG 24 hr tablet Take 50 mg by mouth daily. Take with or immediately following a meal.     nitroGLYCERIN (NITROSTAT) 0.4 MG SL tablet Place 1 tablet (0.4 mg total) under the tongue every 5 (five) minutes as needed for chest pain. 25 tablet 3   omeprazole (PRILOSEC) 20 MG capsule Take 1 capsule (20 mg total) by mouth 2 (two) times daily before a meal. 60 capsule 1   PLAVIX 75 MG tablet Take 1 tablet (75 mg total) by mouth daily. 30 tablet 6   rosuvastatin (CRESTOR) 40 MG tablet Take 1 tablet (40 mg total) by mouth daily. [place]  tablet 3   sucralfate (CARAFATE) 1 g tablet Take 1 tablet (1 g total) by mouth 4 (four) times daily -  with meals and at bedtime. 30 tablet 1   No current facility-administered medications for this visit.    Patient confirms/reports the following allergies:  Allergies  Allergen Reactions   Strawberry Extract Hives and Swelling   Strawberry Flavor Rash    No orders of the defined types were placed in this encounter.   AUTHORIZATION INFORMATION Primary Insurance: 1D#: Group #:  Secondary Insurance: 1D#: Group #:  SCHEDULE INFORMATION: Date: 07/07/22 Time: Location: [facility]

## 2022-07-07 ENCOUNTER — Other Ambulatory Visit: Payer: Self-pay

## 2022-07-07 ENCOUNTER — Encounter
Admission: RE | Disposition: A | Discharge status: Home / Self Care | Payer: Self-pay | Source: Home / Self Care | Attending: Gastroenterology

## 2022-07-07 ENCOUNTER — Encounter: Payer: Self-pay | Admitting: Gastroenterology

## 2022-07-07 ENCOUNTER — Ambulatory Visit
Admission: RE | Admit: 2022-07-07 | Discharge: 2022-07-07 | Disposition: A | Discharge status: Home / Self Care | Payer: 59 | Attending: Gastroenterology | Admitting: Gastroenterology

## 2022-07-07 ENCOUNTER — Ambulatory Visit: Payer: 59 | Admitting: Anesthesiology

## 2022-07-07 DIAGNOSIS — Z7985 Long-term (current) use of injectable non-insulin antidiabetic drugs: Secondary | ICD-10-CM | POA: Diagnosis not present

## 2022-07-07 DIAGNOSIS — K219 Gastro-esophageal reflux disease without esophagitis: Secondary | ICD-10-CM | POA: Diagnosis not present

## 2022-07-07 DIAGNOSIS — Z87891 Personal history of nicotine dependence: Secondary | ICD-10-CM | POA: Diagnosis not present

## 2022-07-07 DIAGNOSIS — Z955 Presence of coronary angioplasty implant and graft: Secondary | ICD-10-CM | POA: Insufficient documentation

## 2022-07-07 DIAGNOSIS — Z8371 Family history of colonic polyps: Secondary | ICD-10-CM | POA: Insufficient documentation

## 2022-07-07 DIAGNOSIS — I252 Old myocardial infarction: Secondary | ICD-10-CM | POA: Insufficient documentation

## 2022-07-07 DIAGNOSIS — E1140 Type 2 diabetes mellitus with diabetic neuropathy, unspecified: Secondary | ICD-10-CM | POA: Insufficient documentation

## 2022-07-07 DIAGNOSIS — Z95828 Presence of other vascular implants and grafts: Secondary | ICD-10-CM | POA: Diagnosis not present

## 2022-07-07 DIAGNOSIS — K5730 Diverticulosis of large intestine without perforation or abscess without bleeding: Secondary | ICD-10-CM | POA: Insufficient documentation

## 2022-07-07 DIAGNOSIS — D126 Benign neoplasm of colon, unspecified: Secondary | ICD-10-CM

## 2022-07-07 DIAGNOSIS — Z1211 Encounter for screening for malignant neoplasm of colon: Secondary | ICD-10-CM

## 2022-07-07 DIAGNOSIS — D122 Benign neoplasm of ascending colon: Secondary | ICD-10-CM | POA: Diagnosis not present

## 2022-07-07 DIAGNOSIS — Z8679 Personal history of other diseases of the circulatory system: Secondary | ICD-10-CM | POA: Diagnosis not present

## 2022-07-07 DIAGNOSIS — E785 Hyperlipidemia, unspecified: Secondary | ICD-10-CM | POA: Diagnosis not present

## 2022-07-07 DIAGNOSIS — I2510 Atherosclerotic heart disease of native coronary artery without angina pectoris: Secondary | ICD-10-CM | POA: Insufficient documentation

## 2022-07-07 DIAGNOSIS — I10 Essential (primary) hypertension: Secondary | ICD-10-CM | POA: Insufficient documentation

## 2022-07-07 DIAGNOSIS — K635 Polyp of colon: Secondary | ICD-10-CM | POA: Insufficient documentation

## 2022-07-07 HISTORY — DX: Polyneuropathy, unspecified: G62.9

## 2022-07-07 HISTORY — DX: Acute myocardial infarction, unspecified: I21.9

## 2022-07-07 HISTORY — PX: COLONOSCOPY WITH PROPOFOL: SHX5780

## 2022-07-07 SURGERY — COLONOSCOPY WITH PROPOFOL
Anesthesia: General

## 2022-07-07 MED ORDER — SODIUM CHLORIDE 0.9 % IV SOLN: INTRAVENOUS | Status: DC

## 2022-07-07 MED ORDER — PROPOFOL 10 MG/ML IV BOLUS
INTRAVENOUS | Status: DC | PRN
  Administered 2022-07-07: 60 mg via INTRAVENOUS

## 2022-07-07 MED ORDER — SIMETHICONE 40 MG/0.6ML PO SUSP
ORAL | Status: DC | PRN
  Administered 2022-07-07 (×2): 60 mL via ORAL

## 2022-07-07 MED ORDER — PROPOFOL 500 MG/50ML IV EMUL
INTRAVENOUS | Status: DC | PRN
  Administered 2022-07-07: 200 ug/kg/min via INTRAVENOUS

## 2022-07-07 NOTE — Anesthesia Preprocedure Evaluation (Signed)
Anesthesia Evaluation  Patient identified by MRN, date of birth, ID band Patient awake    Reviewed: Allergy & Precautions, NPO status , Patient's Chart, lab work & pertinent test results  Airway Mallampati: III  TM Distance: <3 FB Neck ROM: full    Dental  (+) Poor Dentition   Pulmonary neg pulmonary ROS, former smoker,    Pulmonary exam normal breath sounds clear to auscultation       Cardiovascular Exercise Tolerance: Poor hypertension, Pt. on medications + angina with exertion + CAD, + Past MI and + Peripheral Vascular Disease  negative cardio ROS Normal cardiovascular examIII Rhythm:Regular     Neuro/Psych  Headaches, negative neurological ROS  negative psych ROS   GI/Hepatic negative GI ROS, Neg liver ROS, hiatal hernia, GERD  Medicated,  Endo/Other  negative endocrine ROSdiabetes  Renal/GU negative Renal ROS  negative genitourinary   Musculoskeletal   Abdominal Normal abdominal exam  (+)   Peds negative pediatric ROS (+)  Hematology negative hematology ROS (+)   Anesthesia Other Findings Past Medical History: No date: CAD (coronary artery disease)     Comment:  a. PCI to proximal and distal RCA 3/26 No date: GERD (gastroesophageal reflux disease) No date: Heart murmur     Comment:  MR No date: Hiatal hernia No date: Hyperlipidemia No date: Hypertension No date: Myocardial infarction ([place]) No date: Neuropathy No date: Tobacco abuse     Comment:  a. Started at age 26, quit during 3 pregnancies. b. 1               PPD for a 40 pack-year hx (2019)   Past Surgical History: No date: ABDOMINAL AORTIC ANEURYSM REPAIR     Comment:  2019 No date: BREAST BIOPSY; Right     Comment:  Multiple biopsies-all benign 1986: BREAST SURGERY     Comment:  I&D for 'milk duct' 01/13/2019: CORONARY STENT INTERVENTION; N/A     Comment:  Procedure: CORONARY STENT INTERVENTION;  Surgeon: Nelva Bush,  MD;  Location: [facility] [facility];                Service: Cardiovascular;  Laterality: N/A; 08/13/2018: EMBOLECTOMY     Comment:  Procedure: Right SFA and profunda femoris Fogarty               embolectomy 3  Fogarty embolectomy balloon;  Surgeon:               Algernon Huxley, MD;  Location: [facility];  Service:               Vascular;; 08/13/2018: ENDARTERECTOMY FEMORAL; Right     Comment:  Procedure: Right common femoral, profunda femoris, and               superficial femoral artery endarterectomies and patch               angioplasty ;  Surgeon: Algernon Huxley, MD;  Location: [facility]              ORS;  Service: Vascular;  Laterality: Right; 08/13/2018: ENDOVASCULAR REPAIR/STENT GRAFT; N/A     Comment:  Procedure: ENDOVASCULAR REPAIR/STENT GRAFT;  Surgeon:               Katha Cabal, MD;  Location: [facility];               Service: Cardiovascular;  Laterality: N/A; 01/13/2019: LEFT HEART CATH AND CORONARY ANGIOGRAPHY; Left     Comment:  Procedure: LEFT HEART CATH AND CORONARY ANGIOGRAPHY;                Surgeon: Minna Merritts, MD;  Location: [place]               [facility];  Service: Cardiovascular;  Laterality: Left; 2000: SALPINGOOPHORECTOMY; Left  BMI    Body Mass Index: 28.73 kg/m      Reproductive/Obstetrics negative OB ROS                             Anesthesia Physical Anesthesia Plan  ASA: 4  Anesthesia Plan: General   Post-op Pain Management:    Induction: Intravenous  PONV Risk Score and Plan: Propofol infusion and TIVA  Airway Management Planned: Natural Airway  Additional Equipment:   Intra-op Plan:   Post-operative Plan:   Informed Consent: I have reviewed the patients History and Physical, chart, labs and discussed the procedure including the risks, benefits and alternatives for the proposed anesthesia with the patient or authorized representative who has indicated his/her understanding and acceptance.      Dental Advisory Given  Plan Discussed with: CRNA and Surgeon  Anesthesia Plan Comments:         Anesthesia Quick Evaluation

## 2022-07-07 NOTE — Anesthesia Procedure Notes (Signed)
Date/Time: 07/07/2022 9:59 AM  Performed by: Nelda Marseille, CRNAPre-anesthesia Checklist: Patient identified, Emergency Drugs available, Suction available, Patient being monitored and Timeout performed Oxygen Delivery Method: Nasal cannula

## 2022-07-07 NOTE — Transfer of Care (Signed)
Immediate Anesthesia Transfer of Care Note  Patient: Heather Mahoney  Procedure(s) Performed: COLONOSCOPY WITH PROPOFOL  Patient Location: PACU  Anesthesia Type:General  Level of Consciousness: awake and alert   Airway & Oxygen Therapy: Patient Spontanous Breathing and Patient connected to nasal cannula oxygen  Post-op Assessment: Report given to RN and Post -op Vital signs reviewed and stable  Post vital signs: Reviewed and stable  Last Vitals:  Vitals Value Taken Time  BP 145/111 07/07/22 1021  Temp 36 C 07/07/22 1021  Pulse 97 07/07/22 1022  Resp 16 07/07/22 1022  SpO2 100 % 07/07/22 1022  Vitals shown include unvalidated device data.  Last Pain:  Vitals:   07/07/22 1021  TempSrc: Tympanic  PainSc: Asleep         Complications: No notable events documented.

## 2022-07-07 NOTE — Anesthesia Postprocedure Evaluation (Signed)
Anesthesia Post Note  Patient: Heather Mahoney  Procedure(s) Performed: COLONOSCOPY WITH PROPOFOL  Patient location during evaluation: PACU Anesthesia Type: General Level of consciousness: awake and oriented Pain management: satisfactory to patient Vital Signs Assessment: post-procedure vital signs reviewed and stable Respiratory status: spontaneous breathing and respiratory function stable Cardiovascular status: stable Anesthetic complications: no   No notable events documented.   Last Vitals:  Vitals:   07/07/22 1031 07/07/22 1041  BP: (!) 177/104 (!) 176/104  Pulse: 93 79  Resp: 20 12  Temp:    SpO2: 100% 100%    Last Pain:  Vitals:   07/07/22 1041  TempSrc:   PainSc: 0-No pain                 VAN STAVEREN,Darrow Barreiro

## 2022-07-07 NOTE — Op Note (Signed)
[facility] Gastroenterology Patient Name: Heather Mahoney Procedure Date: 07/07/2022 9:39 AM MRN: [id number] Account #: [id number] Date of Birth: 03/12/1966 Admit Type: Outpatient Age: 56 Room: [facility] ENDO ROOM 1 Gender: Female Note Status: Finalized Instrument Name: Park Meo [id number] Procedure:             Colonoscopy Indications:           Colon cancer screening in patient at increased risk:                         Family history of 1st-degree relative with colon polyps Providers:             Jonathon Bellows MD, MD Referring MD:          [facility] health service Medicines:             Monitored Anesthesia Care Complications:         No immediate complications. Procedure:             Pre-Anesthesia Assessment:                        - Prior to the procedure, a History and Physical was                         performed, and patient medications, allergies and                         sensitivities were reviewed. The patient's tolerance                         of previous anesthesia was reviewed.                        - The risks and benefits of the procedure and the                         sedation options and risks were discussed with the                         patient. All questions were answered and informed                         consent was obtained.                        - Patient identification and proposed procedure were                         verified prior to the procedure.                        - ASA Grade Assessment: II - A patient with mild                         systemic disease.                        After obtaining informed consent, the colonoscope was                         passed under  direct vision. Throughout the procedure,                         the patient's blood pressure, pulse, and oxygen                         saturations were monitored continuously. The                         Colonoscope was introduced through the anus and                          advanced to the the cecum, identified by the                         appendiceal orifice. The colonoscopy was performed                         with ease. The patient tolerated the procedure well.                         The quality of the bowel preparation was excellent. Findings:      The perianal and digital rectal examinations were normal.      Three sessile polyps were found in the sigmoid colon. The polyps were 4       to 5 mm in size. These polyps were removed with a cold snare. Resection       and retrieval were complete.      A 5 mm polyp was found in the ascending colon. The polyp was sessile.       The polyp was removed with a cold snare. Resection and retrieval were       complete.      A 3 mm polyp was found in the ascending colon. The polyp was sessile.       The polyp was removed with a cold biopsy forceps. Resection and       retrieval were complete.      A 5 mm polyp was found in the descending colon. The polyp was sessile.       The polyp was removed with a cold snare. Resection was complete, but the       polyp tissue was not retrieved.      Multiple small and large-mouthed diverticula were found in the entire       colon.      The exam was otherwise without abnormality on direct and retroflexion       views. Impression:            - Three 4 to 5 mm polyps in the sigmoid colon, removed                         with a cold snare. Resected and retrieved.                        - One 5 mm polyp in the ascending colon, removed with                         a cold snare. Resected and retrieved.                        -  One 3 mm polyp in the ascending colon, removed with                         a cold biopsy forceps. Resected and retrieved.                        - One 5 mm polyp in the descending colon, removed with                         a cold snare. Complete resection. Polyp tissue not                         retrieved.                        -  Diverticulosis in the entire examined colon.                        - The examination was otherwise normal on direct and                         retroflexion views. Recommendation:        - Discharge patient to home (with escort).                        - Resume previous diet.                        - Continue present medications.                        - Await pathology results.                        - Repeat colonoscopy in 3 - 5 years for surveillance. Procedure Code(s):     --- Professional ---                        [phone], Colonoscopy, flexible; with removal of                         tumor(s), polyp(s), or other lesion(s) by snare                         technique                        [id number], 66, Colonoscopy, flexible; with biopsy, single                         or multiple Diagnosis Code(s):     --- Professional ---                        K63.5, Polyp of colon                        Z83.71, Family history of colonic polyps                        K57.30, Diverticulosis of large intestine without  perforation or abscess without bleeding CPT copyright 2019 American Medical Association. All rights reserved. The codes documented in this report are preliminary and upon coder review may  be revised to meet current compliance requirements. Jonathon Bellows, MD Jonathon Bellows MD, MD 07/07/2022 10:18:19 AM This report has been signed electronically. Number of Addenda: 0 Note Initiated On: 07/07/2022 9:39 AM Scope Withdrawal Time: 0 hours 11 minutes 26 seconds  Total Procedure Duration: 0 hours 18 minutes 15 seconds  Estimated Blood Loss:  Estimated blood loss: none.      [facility]

## 2022-07-07 NOTE — H&P (Signed)
Jonathon Bellows, MD [address] [place], [place], [place], [address] [id number] [place], [place], [place], [address] Phone: [phone]  Fax: [phone]  Primary Care Physician:  [place]   Pre-Procedure History & Physical: HPI:  Heather Mahoney is a 56 y.o. female is here for an colonoscopy.   Past Medical History:  Diagnosis Date   CAD (coronary artery disease)    a. PCI to proximal and distal RCA 3/26   GERD (gastroesophageal reflux disease)    Heart murmur    MR   Hiatal hernia    Hyperlipidemia    Hypertension    Myocardial infarction ([place])    Neuropathy    Tobacco abuse    a. Started at age 7, quit during 3 pregnancies. b. 1 PPD for a 40 pack-year hx (2019)     Past Surgical History:  Procedure Laterality Date   ABDOMINAL AORTIC ANEURYSM REPAIR     2019   BREAST BIOPSY Right    Multiple biopsies-all benign   BREAST SURGERY  1986   I&D for 'milk duct'   CORONARY STENT INTERVENTION N/A 01/13/2019   Procedure: CORONARY STENT INTERVENTION;  Surgeon: Nelva Bush, MD;  Location: [place] [facility];  Service: Cardiovascular;  Laterality: N/A;   EMBOLECTOMY  08/13/2018   Procedure: Right SFA and profunda femoris Fogarty embolectomy 3  Fogarty embolectomy balloon;  Surgeon: Algernon Huxley, MD;  Location: [facility];  Service: Vascular;;   ENDARTERECTOMY FEMORAL Right 08/13/2018   Procedure: Right common femoral, profunda femoris, and superficial femoral artery endarterectomies and patch angioplasty ;  Surgeon: Algernon Huxley, MD;  Location: [facility];  Service: Vascular;  Laterality: Right;   ENDOVASCULAR REPAIR/STENT GRAFT N/A 08/13/2018   Procedure: ENDOVASCULAR REPAIR/STENT GRAFT;  Surgeon: Katha Cabal, MD;  Location: [place] [facility];  Service: Cardiovascular;  Laterality: N/A;   LEFT HEART CATH AND CORONARY ANGIOGRAPHY Left 01/13/2019   Procedure: LEFT HEART CATH AND CORONARY ANGIOGRAPHY;  Surgeon: Minna Merritts, MD;   Location: [place] [facility];  Service: Cardiovascular;  Laterality: Left;   SALPINGOOPHORECTOMY Left 2000    Prior to Admission medications   Medication Sig Start Date End Date Taking? Authorizing Provider  empagliflozin (JARDIANCE) 10 MG TABS tablet Take by mouth daily.   Yes [provider]  gabapentin (NEURONTIN) 600 MG tablet Take 600 mg by mouth 3 (three) times daily as needed. 11/30/20  Yes [provider]  losartan (COZAAR) 100 MG tablet Take 1 tablet by mouth once daily 12/17/20  Yes Gollan, Kathlene November, MD  metoprolol succinate (TOPROL-XL) 50 MG 24 hr tablet Take 50 mg by mouth daily. Take with or immediately following a meal.   Yes [provider]  omeprazole (PRILOSEC) 20 MG capsule Take 1 capsule (20 mg total) by mouth 2 (two) times daily before a meal. 07/09/20  Yes Gollan, Kathlene November, MD  amLODipine (NORVASC) 10 MG tablet Take 1 tablet by mouth once daily 12/17/20   Minna Merritts, MD  aspirin 81 MG EC tablet Take 1 tablet (81 mg total) by mouth daily. 01/14/19   Marrianne Mood D, PA-C  cholecalciferol (VITAMIN D3) 25 MCG (1000 UNIT) tablet Take 50,000 Units by mouth once a week.    [provider]  cloNIDine (CATAPRES) 0.2 MG tablet Take 1 tablet (0.2 mg total) by mouth in the morning and at bedtime. Take an extra 0.1 mg for BP >160 01/07/22   Minna Merritts,  MD  cyclobenzaprine (FLEXERIL) 10 MG tablet Take 10 mg by mouth 3 (three) times daily. 04/15/21   [provider]  D3-50 1.25 MG (50000 UT) capsule Take 50,000 Units by mouth once a week. Patient not taking: Reported on 01/07/2022 11/30/20   [provider]  docusate sodium (COLACE) 100 MG capsule Take 1 capsule (100 mg total) by mouth daily. Patient not taking: Reported on 07/09/2021 08/16/18   Vaughan Basta, MD  esomeprazole (NEXIUM) 40 MG capsule Take 40 mg by mouth daily. 12/27/20   [provider]  ezetimibe (ZETIA) 10 MG tablet Take 1 tablet (10 mg  total) by mouth daily. 03/28/20   Minna Merritts, MD  famotidine (PEPCID) 40 MG tablet Take by mouth daily. Patient not taking: Reported on 07/09/2021 04/28/20   [provider]  hydrOXYzine (ATARAX/VISTARIL) 25 MG tablet Take 25 mg by mouth every 6 (six) hours as needed. Patient not taking: Reported on 07/09/2021 04/29/20   [provider]  meloxicam (MOBIC) 7.5 MG tablet Take 7.5 mg by mouth 2 (two) times daily. 10/28/21   [provider]  methocarbamol (ROBAXIN) 500 MG tablet Take 1,000 mg by mouth 4 (four) times daily. 04/15/21   [provider]  nitroGLYCERIN (NITROSTAT) 0.4 MG SL tablet Place 1 tablet (0.4 mg total) under the tongue every 5 (five) minutes as needed for chest pain. 01/07/22   Minna Merritts, MD  PLAVIX 75 MG tablet Take 1 tablet (75 mg total) by mouth daily. 11/28/20   Minna Merritts, MD  rosuvastatin (CRESTOR) 40 MG tablet Take 1 tablet (40 mg total) by mouth daily. 07/09/21 01/07/22  Minna Merritts, MD  sucralfate (CARAFATE) 1 g tablet Take 1 tablet (1 g total) by mouth 4 (four) times daily -  with meals and at bedtime. 04/25/20   Lavonia Drafts, MD    Allergies as of 06/27/2022 - Review Complete 06/27/2022  Allergen Reaction Noted   Strawberry extract Hives and Swelling 04/25/2015   Strawberry flavor Rash 01/05/2018    Family History  Problem Relation Age of Onset   Breast cancer Mother    Diabetes Mother    Hypertension Mother    Hyperlipidemia Mother    Heart disease Mother        CABG X 4   Asthma Mother 71   Sarcoidosis Mother        In remission   Heart attack Mother    Breast cancer Maternal Grandmother 90   Diabetes Maternal Grandmother    Heart disease Maternal Grandmother    Hyperlipidemia Maternal Grandmother     Social History   Socioeconomic History   Marital status: Single    Spouse name: Not on file   Number of children: Not on file   Years of education: Not on file   Highest education level: Not on  file  Occupational History   Not on file  Tobacco Use   Smoking status: Former    Packs/day: 0.25    Years: 38.00    Total pack years: 9.50    Types: Cigarettes    Quit date: 08/10/2018    Years since quitting: 3.9   Smokeless tobacco: Never  Vaping Use   Vaping Use: Never used  Substance and Sexual Activity   Alcohol use: Yes    Comment: occassional,none last 24hrs   Drug use: Not Currently    Frequency: 2.0 times per week    Types: Marijuana    Comment: smokes Marijuana 'when I cant  sleep, maybe once or twice a week'   Sexual activity: Yes    Birth control/protection: Post-menopausal  Other Topics Concern   Not on file  Social History Narrative   Not on file   Social Determinants of Health   Financial Resource Strain: Not on file  Food Insecurity: Not on file  Transportation Needs: Not on file  Physical Activity: Not on file  Stress: Not on file  Social Connections: Not on file  Intimate Partner Violence: Not At Risk (05/23/2021)   Humiliation, Afraid, Rape, and Kick questionnaire    Fear of Current or Ex-Partner: No    Emotionally Abused: No    Physically Abused: No    Sexually Abused: No    Review of Systems: See HPI, otherwise negative ROS  Physical Exam: BP (!) 167/110   Pulse 77   Temp (!) 96.5 F (35.8 C) (Temporal)   Resp 20   Ht '5\' 6"'$  (1.676 m)   Wt 80.7 kg   SpO2 98%   BMI 28.73 kg/m  General:   Alert,  pleasant and cooperative in NAD Head:  Normocephalic and atraumatic. Neck:  Supple; no masses or thyromegaly. Lungs:  Clear throughout to auscultation, normal respiratory effort.    Heart:  +S1, +S2, Regular rate and rhythm, No edema. Abdomen:  Soft, nontender and nondistended. Normal bowel sounds, without guarding, and without rebound.   Neurologic:  Alert and  oriented x4;  grossly normal neurologically.  Impression/Plan: Heather Mahoney is here for an colonoscopy to be performed for Screening colonoscopy family historyu of colon polyps    Risks, benefits, limitations, and alternatives regarding  colonoscopy have been reviewed with the patient.  Questions have been answered.  All parties agreeable.   Jonathon Bellows, MD  07/07/2022, 9:43 AM

## 2022-07-08 ENCOUNTER — Encounter: Payer: Self-pay | Admitting: Gastroenterology

## 2022-07-08 LAB — SURGICAL PATHOLOGY

## 2022-08-18 ENCOUNTER — Encounter (INDEPENDENT_AMBULATORY_CARE_PROVIDER_SITE_OTHER): Payer: Self-pay

## 2022-08-26 ENCOUNTER — Ambulatory Visit: Payer: 59 | Attending: Cardiovascular Disease | Admitting: Cardiovascular Disease

## 2022-08-26 ENCOUNTER — Other Ambulatory Visit: Payer: Self-pay | Admitting: Cardiovascular Disease

## 2022-08-26 ENCOUNTER — Encounter: Payer: Self-pay | Admitting: Cardiovascular Disease

## 2022-08-26 VITALS — BP 132/90 | HR 101 | Ht 66.0 in | Wt 173.4 lb

## 2022-08-26 DIAGNOSIS — E1159 Type 2 diabetes mellitus with other circulatory complications: Secondary | ICD-10-CM

## 2022-08-26 DIAGNOSIS — I739 Peripheral vascular disease, unspecified: Secondary | ICD-10-CM

## 2022-08-26 DIAGNOSIS — E782 Mixed hyperlipidemia: Secondary | ICD-10-CM | POA: Diagnosis not present

## 2022-08-26 DIAGNOSIS — I10 Essential (primary) hypertension: Secondary | ICD-10-CM

## 2022-08-26 DIAGNOSIS — I714 Abdominal aortic aneurysm, without rupture, unspecified: Secondary | ICD-10-CM | POA: Diagnosis not present

## 2022-08-26 DIAGNOSIS — I25118 Atherosclerotic heart disease of native coronary artery with other forms of angina pectoris: Secondary | ICD-10-CM | POA: Diagnosis not present

## 2022-08-26 MED ORDER — CLONIDINE HCL 0.2 MG PO TABS: 0.2000 mg | ORAL_TABLET | Freq: Two times a day (BID) | ORAL | 3 refills | Status: DC

## 2022-08-26 MED ORDER — ROSUVASTATIN CALCIUM 40 MG PO TABS: 40.0000 mg | ORAL_TABLET | Freq: Every day | ORAL | 3 refills | Status: DC

## 2022-08-26 MED ORDER — METOPROLOL SUCCINATE ER 50 MG PO TB24: 50.0000 mg | ORAL_TABLET | Freq: Every day | ORAL | 3 refills | Status: DC

## 2022-08-26 MED ORDER — PLAVIX 75 MG PO TABS: 75.0000 mg | ORAL_TABLET | Freq: Every day | ORAL | 6 refills | Status: DC

## 2022-08-26 MED ORDER — AMLODIPINE BESYLATE 10 MG PO TABS: 10.0000 mg | ORAL_TABLET | Freq: Every day | ORAL | 3 refills | Status: DC

## 2022-08-26 MED ORDER — CLOPIDOGREL BISULFATE 75 MG PO TABS: 75.0000 mg | ORAL_TABLET | Freq: Every day | ORAL | 3 refills | Status: DC

## 2022-08-26 NOTE — Progress Notes (Signed)
Date:  08/26/2022   ID:  Shunte M Stefanko, DOB Sep 07, 1966, MRN [id number]  Patient Location:  [address] [place] [address]   Provider location:   [facility], [place] office  PCP:  [place]  Cardiologist:  Rockey Situ, [place]   Chief Complaint  Patient presents with   right leg swelling     Patient c/o pain from the waist down, difficulty walking, pain & swelling in right leg with numbness, tingling, cold toes, extreme tiredness, shortness of breath & chest pain. Medications reviewed by the patient verbally.     History of Present Illness:    Heather Mahoney is a 56 y.o. female past medical history of past medical history of Smoker GERD HTN PVCs Fall and question of loss of consciousness 2017, secondary to orthostasis,  AAA with endovascular repair October 2019 CAD, stent x2 to RCA March 2020 Echocardiogram May 2020, EF 50 to 55% Who presents today for follow-up of her chest pain/angina, shortness of breath, CAD, results of her recent cardiac CT  Last seen in clinic September 2022 Seen by telemetry visit March 2023  Numb on right leg > than left Chronic claudication pain, Followed by Dr. Lucky Cowboy Neuropathy, chronic pain in her feet Reports she has run out of her gabapentin  Recent imaging studies reviewed Normal ABIs March 2023 Ultrasound March 2023 with normal abdominal aorta, endovascular repair with no endoleak  Prior cardiac studies reviewed Echocardiogram May 2020, EF 50 to 55%  No recent lipid panel in the system, " I can ask primary care" On Crestor Zetia  Used to work at [employer],  Having more issues with her legs recently  Stopped smoking >4 years ago   GERD symptoms stable No chest pain concerning for angina   EKG personally reviewed by myself on todays visit Sinus tachycardia rate 101 bpm no significant ST or T wave changes  Discussed prior cardiac catheterization, 60% lesion mid RCA    Other past medical history reviewed cardiac CTA showing stenosis in RCA by FFR   cardiac catheterization January 13, 2019   Cardiac cath 01/13/2019 Single-vessel disease notably of the RCA moderate to severe proximal  disease estimated 75% and distal RCA disease estimated at 80% There is disease of high OM vessel, small in caliber diffusely diseased Mild proximal LAD disease   --- stenting of the proximal RCA and distal RCA  subsequent left groin hematoma causing hypotension, vagal response Was kept for 2 days post procedure in the hospital Her   Medication intolerances  isosorbide  caused  headache    peripheral vascular catheterization in October 2019  AAA repair, endograft   Mother with bypass surgery Her grandmother, mother side, passed away at the age of 43 from a brain aneurysm.    Cardiac CTA cardiac CT scan was ordered showing severe stenosis mid RCA (FFR less than 0.7), indeterminate stenosis LAD and circumflex (FFR less than 0.8)    Past Medical History:  Diagnosis Date   CAD (coronary artery disease)    a. PCI to proximal and distal RCA 3/26   GERD (gastroesophageal reflux disease)    Heart murmur    MR   Hiatal hernia    Hyperlipidemia    Hypertension    Myocardial infarction ([place])    Neuropathy    Tobacco abuse    a. Started at age 23, quit during 3 pregnancies. b. 1 PPD for a 40 pack-year hx (2019)  Past Surgical History:  Procedure Laterality Date   ABDOMINAL AORTIC ANEURYSM REPAIR     2019   BREAST BIOPSY Right    Multiple biopsies-all benign   BREAST SURGERY  1986   I&D for 'milk duct'   COLONOSCOPY WITH PROPOFOL N/A 07/07/2022   Procedure: COLONOSCOPY WITH PROPOFOL;  Surgeon: Jonathon Bellows, MD;  Location: [facility] ENDOSCOPY;  Service: Gastroenterology;  Laterality: N/A;   CORONARY STENT INTERVENTION N/A 01/13/2019   Procedure: CORONARY STENT INTERVENTION;  Surgeon: Nelva Bush, MD;  Location: [place] [facility];  Service: Cardiovascular;   Laterality: N/A;   EMBOLECTOMY  08/13/2018   Procedure: Right SFA and profunda femoris Fogarty embolectomy 3  Fogarty embolectomy balloon;  Surgeon: Algernon Huxley, MD;  Location: [facility] ORS;  Service: Vascular;;   ENDARTERECTOMY FEMORAL Right 08/13/2018   Procedure: Right common femoral, profunda femoris, and superficial femoral artery endarterectomies and patch angioplasty ;  Surgeon: Algernon Huxley, MD;  Location: [facility] ORS;  Service: Vascular;  Laterality: Right;   ENDOVASCULAR REPAIR/STENT GRAFT N/A 08/13/2018   Procedure: ENDOVASCULAR REPAIR/STENT GRAFT;  Surgeon: Katha Cabal, MD;  Location: [place] [facility];  Service: Cardiovascular;  Laterality: N/A;   LEFT HEART CATH AND CORONARY ANGIOGRAPHY Left 01/13/2019   Procedure: LEFT HEART CATH AND CORONARY ANGIOGRAPHY;  Surgeon: Minna Merritts, MD;  Location: [place] [facility];  Service: Cardiovascular;  Laterality: Left;   SALPINGOOPHORECTOMY Left 2000      Allergies:   Strawberry extract and Strawberry flavor   Social History   Tobacco Use   Smoking status: Former    Packs/day: 0.25    Years: 38.00    Total pack years: 9.50    Types: Cigarettes    Quit date: 08/10/2018    Years since quitting: 4.0   Smokeless tobacco: Never  Vaping Use   Vaping Use: Never used  Substance Use Topics   Alcohol use: Not Currently    Comment: occassional,none last 24hrs   Drug use: Yes    Frequency: 2.0 times per week    Types: Marijuana    Comment: smokes Marijuana 'when I cant sleep, maybe once or twice a week'     Current Outpatient Medications on File Prior to Visit  Medication Sig Dispense Refill   amLODipine (NORVASC) 10 MG tablet Take 1 tablet by mouth once daily 30 tablet 6   aspirin 81 MG EC tablet Take 1 tablet (81 mg total) by mouth daily. 90 tablet 3   cloNIDine (CATAPRES) 0.2 MG tablet Take 1 tablet (0.2 mg total) by mouth in the morning and at bedtime. Take an extra 0.1 mg for BP >160 180 tablet 3   cyclobenzaprine  (FLEXERIL) 10 MG tablet Take 10 mg by mouth 3 (three) times daily.     empagliflozin (JARDIANCE) 10 MG TABS tablet Take by mouth daily.     ezetimibe (ZETIA) 10 MG tablet Take 1 tablet (10 mg total) by mouth daily. 90 tablet 4   gabapentin (NEURONTIN) 600 MG tablet Take 600 mg by mouth 3 (three) times daily as needed.     losartan (COZAAR) 100 MG tablet Take 1 tablet by mouth once daily 30 tablet 6   meloxicam (MOBIC) 7.5 MG tablet Take 7.5 mg by mouth 2 (two) times daily.     methocarbamol (ROBAXIN) 500 MG tablet Take 1,000 mg by mouth 4 (four) times daily.     metoprolol succinate (TOPROL-XL) 50 MG 24 hr tablet Take 50 mg by mouth daily. Take with or  immediately following a meal.     nitroGLYCERIN (NITROSTAT) 0.4 MG SL tablet Place 1 tablet (0.4 mg total) under the tongue every 5 (five) minutes as needed for chest pain. 25 tablet 3   omeprazole (PRILOSEC) 20 MG capsule Take 1 capsule (20 mg total) by mouth 2 (two) times daily before a meal. 60 capsule 1   PLAVIX 75 MG tablet Take 1 tablet (75 mg total) by mouth daily. 30 tablet 6   rosuvastatin (CRESTOR) 40 MG tablet Take 1 tablet (40 mg total) by mouth daily. 90 tablet 3   sucralfate (CARAFATE) 1 g tablet Take 1 tablet (1 g total) by mouth 4 (four) times daily -  with meals and at bedtime. 30 tablet 1   cholecalciferol (VITAMIN D3) 25 MCG (1000 UNIT) tablet Take 50,000 Units by mouth once a week. (Patient not taking: Reported on 08/26/2022)     D3-50 1.25 MG (50000 UT) capsule Take 50,000 Units by mouth once a week. (Patient not taking: Reported on 01/07/2022)     esomeprazole (NEXIUM) 40 MG capsule Take 40 mg by mouth daily. (Patient not taking: Reported on 08/26/2022)     hydrOXYzine (ATARAX/VISTARIL) 25 MG tablet Take 25 mg by mouth every 6 (six) hours as needed. (Patient not taking: Reported on 07/09/2021)     No current facility-administered medications on file prior to visit.     Family Hx: The patient's family history includes Asthma (age  of onset: 59) in her mother; Breast cancer in her mother; Breast cancer (age of onset: 71) in her maternal grandmother; Diabetes in her maternal grandmother and mother; Heart attack in her mother; Heart disease in her maternal grandmother and mother; Hyperlipidemia in her maternal grandmother and mother; Hypertension in her mother; Sarcoidosis in her mother.  ROS:   Please see the history of present illness.    Review of Systems  Constitutional: Negative.   HENT: Negative.    Respiratory: Negative.    Cardiovascular: Negative.   Gastrointestinal: Negative.   Musculoskeletal:        Right greater than left leg pain  Neurological: Negative.   Psychiatric/Behavioral: Negative.    All other systems reviewed and are negative.    Labs/Other Tests and Data Reviewed:    Recent Labs: 02/24/2022: ALT 22; BUN 9; Creatinine, Ser 0.77; Hemoglobin 12.2; Platelets 469; Potassium 3.7; Sodium 137   Recent Lipid Panel Lab Results  Component Value Date/Time   CHOL 236 (H) 07/09/2020 06:03 PM   CHOL 266 (H) 12/06/2018 10:17 AM   TRIG 170 (H) 07/09/2020 06:03 PM   HDL 53 07/09/2020 06:03 PM   HDL 64 12/06/2018 10:17 AM   CHOLHDL 4.5 07/09/2020 06:03 PM   LDLCALC 149 (H) 07/09/2020 06:03 PM   LDLCALC 178 (H) 12/06/2018 10:17 AM   LDLDIRECT 185 (H) 12/06/2018 10:17 AM    Wt Readings from Last 3 Encounters:  08/26/22 173 lb 6 oz (78.6 kg)  07/07/22 178 lb (80.7 kg)  02/24/22 180 lb (81.6 kg)     Exam:    Vital Signs: Vital signs may also be detailed in the HPI BP (!) 132/90 (BP Location: Left Arm, Patient Position: Sitting, Cuff Size: Normal)   Pulse (!) 101   Ht '5\' 6"'$  (1.676 m)   Wt 173 lb 6 oz (78.6 kg)   SpO2 98%   BMI 27.98 kg/m   Constitutional:  oriented to person, place, and time. No distress.  HENT:  Head: Grossly normal Eyes:  no discharge. No scleral icterus.  Neck: No JVD, no carotid bruits  Cardiovascular: Regular rate and rhythm, no murmurs appreciated Pulmonary/Chest:  Clear to auscultation bilaterally, no wheezes or rails Abdominal: Soft.  no distension.  no tenderness.  Musculoskeletal: Normal range of motion Neurological:  normal muscle tone. Coordination normal. No atrophy Skin: Skin warm and dry Psychiatric: normal affect, pleasant   ASSESSMENT & PLAN:    Problem List Items Addressed This Visit       Cardiology Problems   AAA (abdominal aortic aneurysm) (HCC)   PAD (peripheral artery disease) (HCC)   CAD (coronary artery disease) - Primary   Mixed hyperlipidemia   Essential hypertension, benign     Other   Diabetes ([place])  Coronary disease with stable angina Currently with no symptoms of angina. No further workup at this time. Continue current medication regimen. She will have lab work done through primary care  Hyperlipidemia Continue Crestor and Zetia Lipid panel requested from primary care Goal LDL less than 55  Essential hypertension Blood pressure is well controlled on today's visit. No changes made to the medications. Medications refilled  Chronic leg pain Reports right greater than left chronic leg pain, takes gabapentin for neuropathic pain   Total encounter time more than 30 minutes  Greater than 50% was spent in counseling and coordination of care with the patient    Signed, Ida Rogue, [place] [facility] [id number], [place], [place] [address]

## 2022-08-26 NOTE — Patient Instructions (Addendum)
Medication Instructions:  No changes  If you need a refill on your cardiac medications before your next appointment, please call your pharmacy.   Lab work: No new labs needed  Testing/Procedures: No new testing needed  Follow-Up: At [facility], you and your health needs are our priority.  As part of our continuing mission to provide you with exceptional heart care, we have created designated Provider Care Teams.  These Care Teams include your primary Cardiologist (physician) and Advanced Practice Providers (APPs -  Physician Assistants and Nurse Practitioners) who all work together to provide you with the care you need, when you need it.  You will need a follow up appointment in 12 months  Providers on your designated Care Team:   Christopher Berge, NP Ryan Dunn, PA-C Cadence Furth, PA-C  COVID-19 Vaccine Information can be found at: [email or web address] For questions related to vaccine distribution or appointments, please email [email or web address] or call [phone].   

## 2022-09-01 ENCOUNTER — Ambulatory Visit (INDEPENDENT_AMBULATORY_CARE_PROVIDER_SITE_OTHER): Payer: 59

## 2022-09-01 ENCOUNTER — Other Ambulatory Visit: Payer: Self-pay | Admitting: Vascular Surgery

## 2022-09-01 ENCOUNTER — Encounter: Payer: Self-pay | Admitting: Vascular Surgery

## 2022-09-01 DIAGNOSIS — I739 Peripheral vascular disease, unspecified: Secondary | ICD-10-CM

## 2022-09-02 ENCOUNTER — Encounter (INDEPENDENT_AMBULATORY_CARE_PROVIDER_SITE_OTHER): Payer: Self-pay | Admitting: Vascular Surgery

## 2022-09-02 ENCOUNTER — Ambulatory Visit (INDEPENDENT_AMBULATORY_CARE_PROVIDER_SITE_OTHER): Payer: 59 | Admitting: Vascular Surgery

## 2022-09-02 VITALS — BP 202/120 | HR 83 | Resp 16 | Wt 176.0 lb

## 2022-09-02 DIAGNOSIS — I10 Essential (primary) hypertension: Secondary | ICD-10-CM

## 2022-09-02 DIAGNOSIS — I714 Abdominal aortic aneurysm, without rupture, unspecified: Secondary | ICD-10-CM

## 2022-09-02 DIAGNOSIS — E1159 Type 2 diabetes mellitus with other circulatory complications: Secondary | ICD-10-CM

## 2022-09-02 DIAGNOSIS — I70213 Atherosclerosis of native arteries of extremities with intermittent claudication, bilateral legs: Secondary | ICD-10-CM

## 2022-09-02 NOTE — Assessment & Plan Note (Signed)
blood glucose control important in reducing the progression of atherosclerotic disease. Also, involved in wound healing. On appropriate medications.  

## 2022-09-02 NOTE — Progress Notes (Signed)
MRN : [id number]  Heather Mahoney is a 56 y.o. (07-08-1966) female who presents with chief complaint of  Chief Complaint  Patient presents with   Follow-up    Ultrasound results  .  History of Present Illness: Patient returns today in follow up of worsening right leg pain.  For several months now, she has been having worsening right lower extremity claudication symptoms.  She is now having pain at rest.  She denies any trauma, injury, or inciting event that started the symptoms.  She had a an aneurysm stent graft repair about 4 years ago and had to have a right femoral endarterectomy after the repair for occlusive disease.  Her legs were doing fine until a few months ago.  She still has no left leg symptoms.  She had noninvasive studies performed yesterday which demonstrated a drop in her right ABI down to 0.7 with blunted waveforms on the right.  Her left ABI remains normal at 1 with good waveforms.  Current Outpatient Medications  Medication Sig Dispense Refill   amLODipine (NORVASC) 10 MG tablet Take 1 tablet (10 mg total) by mouth daily. 90 tablet 3   aspirin 81 MG EC tablet Take 1 tablet (81 mg total) by mouth daily. 90 tablet 3   cloNIDine (CATAPRES) 0.2 MG tablet Take 1 tablet (0.2 mg total) by mouth in the morning and at bedtime. Take an extra 0.1 mg for BP >160 180 tablet 3   clopidogrel (PLAVIX) 75 MG tablet Take 1 tablet (75 mg total) by mouth daily. 90 tablet 3   cyclobenzaprine (FLEXERIL) 10 MG tablet Take 10 mg by mouth 3 (three) times daily.     empagliflozin (JARDIANCE) 10 MG TABS tablet Take by mouth daily.     ezetimibe (ZETIA) 10 MG tablet Take 1 tablet (10 mg total) by mouth daily. 90 tablet 4   gabapentin (NEURONTIN) 600 MG tablet Take 600 mg by mouth 3 (three) times daily as needed.     losartan (COZAAR) 100 MG tablet Take 1 tablet by mouth once daily 30 tablet 6   meloxicam (MOBIC) 7.5 MG tablet Take 7.5 mg by mouth 2 (two) times daily.     methocarbamol (ROBAXIN) 500  MG tablet Take 1,000 mg by mouth 4 (four) times daily.     metoprolol succinate (TOPROL-XL) 50 MG 24 hr tablet Take 1 tablet (50 mg total) by mouth daily. Take with or immediately following a meal. 90 tablet 3   nitroGLYCERIN (NITROSTAT) 0.4 MG SL tablet Place 1 tablet (0.4 mg total) under the tongue every 5 (five) minutes as needed for chest pain. 25 tablet 3   omeprazole (PRILOSEC) 20 MG capsule Take 1 capsule (20 mg total) by mouth 2 (two) times daily before a meal. 60 capsule 1   rosuvastatin (CRESTOR) 40 MG tablet Take 1 tablet (40 mg total) by mouth daily. 90 tablet 3   sucralfate (CARAFATE) 1 g tablet Take 1 tablet (1 g total) by mouth 4 (four) times daily -  with meals and at bedtime. 30 tablet 1   cholecalciferol (VITAMIN D3) 25 MCG (1000 UNIT) tablet Take 50,000 Units by mouth once a week. (Patient not taking: Reported on 08/26/2022)     D3-50 1.25 MG (50000 UT) capsule Take 50,000 Units by mouth once a week. (Patient not taking: Reported on 01/07/2022)     esomeprazole (NEXIUM) 40 MG capsule Take 40 mg by mouth daily. (Patient not taking: Reported on 08/26/2022)     hydrOXYzine (ATARAX/VISTARIL)  25 MG tablet Take 25 mg by mouth every 6 (six) hours as needed. (Patient not taking: Reported on 07/09/2021)     No current facility-administered medications for this visit.    Past Medical History:  Diagnosis Date   CAD (coronary artery disease)    a. PCI to proximal and distal RCA 3/26   GERD (gastroesophageal reflux disease)    Heart murmur    MR   Hiatal hernia    Hyperlipidemia    Hypertension    Myocardial infarction ([place])    Neuropathy    Tobacco abuse    a. Started at age 19, quit during 3 pregnancies. b. 1 PPD for a 40 pack-year hx (2019)     Past Surgical History:  Procedure Laterality Date   ABDOMINAL AORTIC ANEURYSM REPAIR     2019   BREAST BIOPSY Right    Multiple biopsies-all benign   BREAST SURGERY  1986   I&D for 'milk duct'   COLONOSCOPY WITH PROPOFOL N/A  07/07/2022   Procedure: COLONOSCOPY WITH PROPOFOL;  Surgeon: Jonathon Bellows, MD;  Location: [facility];  Service: Gastroenterology;  Laterality: N/A;   CORONARY STENT INTERVENTION N/A 01/13/2019   Procedure: CORONARY STENT INTERVENTION;  Surgeon: Nelva Bush, MD;  Location: [place] [facility];  Service: Cardiovascular;  Laterality: N/A;   EMBOLECTOMY  08/13/2018   Procedure: Right SFA and profunda femoris Fogarty embolectomy 3  F[place]arty embolectomy balloon;  Surgeon: Algernon Huxley, MD;  Location: [facility] ORS;  Service: Vascular;;   ENDARTERECTOMY FEMORAL Right 08/13/2018   Procedure: Right common femoral, profunda femoris, and superficial femoral artery endarterectomies and patch angioplasty ;  Surgeon: Algernon Huxley, MD;  Location: [facility] ORS;  Service: Vascular;  Laterality: Right;   ENDOVASCULAR REPAIR/STENT GRAFT N/A 08/13/2018   Procedure: ENDOVASCULAR REPAIR/STENT GRAFT;  Surgeon: Katha Cabal, MD;  Location: [place] [facility];  Service: Cardiovascular;  Laterality: N/A;   LEFT HEART CATH AND CORONARY ANGIOGRAPHY Left 01/13/2019   Procedure: LEFT HEART CATH AND CORONARY ANGIOGRAPHY;  Surgeon: Minna Merritts, MD;  Location: [place] [facility];  Service: Cardiovascular;  Laterality: Left;   SALPINGOOPHORECTOMY Left 2000     Social History   Tobacco Use   Smoking status: Former    Packs/day: 0.25    Years: 38.00    Total pack years: 9.50    Types: Cigarettes    Quit date: 08/10/2018    Years since quitting: 4.0   Smokeless tobacco: Never  Vaping Use   Vaping Use: Never used  Substance Use Topics   Alcohol use: Not Currently    Comment: occassional,none last 24hrs   Drug use: Yes    Frequency: 2.0 times per week    Types: Marijuana    Comment: smokes Marijuana 'when I cant sleep, maybe once or twice a week'       Family History  Problem Relation Age of Onset   Breast cancer Mother    Diabetes Mother    Hypertension Mother    Hyperlipidemia Mother    Heart  disease Mother        CABG X 4   Asthma Mother 42   Sarcoidosis Mother        In remission   Heart attack Mother    Breast cancer Maternal Grandmother 90   Diabetes Maternal Grandmother    Heart disease Maternal Grandmother    Hyperlipidemia Maternal Grandmother      Allergies  Allergen Reactions   Strawberry Extract Hives and Swelling  Strawberry Flavor Rash     REVIEW OF SYSTEMS (Negative unless checked)  Constitutional: '[]'$ Weight loss  '[]'$ Fever  '[]'$ Chills Cardiac: '[]'$ Chest pain   '[]'$ Chest pressure   '[]'$ Palpitations   '[]'$ Shortness of breath when laying flat   '[]'$ Shortness of breath at rest   '[]'$ Shortness of breath with exertion. Vascular:  '[x]'$ Pain in legs with walking   '[x]'$ Pain in legs at rest   '[]'$ Pain in legs when laying flat   '[]'$ Claudication   '[]'$ Pain in feet when walking  '[]'$ Pain in feet at rest  '[]'$ Pain in feet when laying flat   '[]'$ History of DVT   '[]'$ Phlebitis   '[]'$ Swelling in legs   '[]'$ Varicose veins   '[]'$ Non-healing ulcers Pulmonary:   '[]'$ Uses home oxygen   '[]'$ Productive cough   '[]'$ Hemoptysis   '[]'$ Wheeze  '[]'$ COPD   '[]'$ Asthma Neurologic:  '[]'$ Dizziness  '[]'$ Blackouts   '[]'$ Seizures   '[]'$ History of stroke   '[]'$ History of TIA  '[]'$ Aphasia   '[]'$ Temporary blindness   '[]'$ Dysphagia   '[]'$ Weakness or numbness in arms   '[]'$ Weakness or numbness in legs Musculoskeletal:  '[x]'$ Arthritis   '[]'$ Joint swelling   '[x]'$ Joint pain   '[]'$ Low back pain Hematologic:  '[]'$ Easy bruising  '[]'$ Easy bleeding   '[]'$ Hypercoagulable state   '[]'$ Anemic   Gastrointestinal:  '[]'$ Blood in stool   '[]'$ Vomiting blood  '[x]'$ Gastroesophageal reflux/heartburn   '[]'$ Abdominal pain Genitourinary:  '[]'$ Chronic kidney disease   '[]'$ Difficult urination  '[]'$ Frequent urination  '[]'$ Burning with urination   '[]'$ Hematuria Skin:  '[]'$ Rashes   '[]'$ Ulcers   '[]'$ Wounds Psychological:  '[]'$ History of anxiety   '[]'$  History of major depression.  Physical Examination  BP (!) 202/120 (BP Location: Right Arm)   Pulse 83   Resp 16   Wt 176 lb (79.8 kg)   BMI 28.41 kg/m  Gen:  WD/WN, NAD Head:  [place]/AT, No temporalis wasting. Ear/Nose/Throat: Hearing grossly intact, nares w/o erythema or drainage Eyes: Conjunctiva clear. Sclera non-icteric Neck: Supple.  Trachea midline Pulmonary:  Good air movement, no use of accessory muscles.  Cardiac: RRR, no JVD Vascular:  Vessel Right Left  Radial Palpable Palpable                          PT 1+ palpable 2+ palpable  DP Trace palpable 2+ palpable   Gastrointestinal: soft, non-tender/non-distended. No guarding/reflex.  Musculoskeletal: M/S 5/5 throughout.  No deformity or atrophy.  No edema. Neurologic: Sensation grossly intact in extremities.  Symmetrical.  Speech is fluent.  Psychiatric: Judgment intact, Mood & affect appropriate for pt's clinical situation. Dermatologic: No rashes or ulcers noted.  No cellulitis or open wounds.      Labs Recent Results (from the past 2160 hour(s))  Surgical pathology     Status: None   Collection Time: 07/07/22  9:58 AM  Result Value Ref Range   SURGICAL PATHOLOGY      SURGICAL PATHOLOGY CASE: [id number] PATIENT: Murray Hodgkins Surgical Pathology Report     Specimen Submitted: A. Colon polyp x3, sigmoid; cold snare B. Colon polyp x2, asc; c snare(1) cbx(1)  Clinical History: Screening colonoscopy.  Colon polyps; diverticulosis      DIAGNOSIS: A.  COLON, SIGMOID, POLYP; COLD SNARE BIOPSIES: - FRAGMENTS OF SESSILE SERRATED POLYPS ADMIXED WITH FRAGMENTS OF BENIGN HYPERPLASTIC COLONIC MUCOSA. - NO EVIDENCE OF HIGH-GRADE DYSPLASIA OR MALIGNANCY.  B.  COLON, ASCENDING, POLYPS; BIOPSIES: - 1 FRAGMENT OF TUBULAR ADENOMA - MULTIPLE FRAGMENTS OF HYPERPLASTIC COLONIC MUCOSA WITH FOCAL REACTIVE NUCLEAR CHANGE. - NO EVIDENCE OF HIGH-GRADE DYSPLASIA OR MALIGNANCY.  GROSS DESCRIPTION: A. Labeled: Cold snare polyp sigmoid colon x3 Received: In formalin Collection time: 9:58 AM on 07/07/2022 Placed into formalin time: 9:58 AM on 07/07/2022 Tissue fragment(s): 3 Size: Aggregate, 1.4  x 1.0 x 0.1 cm Description: Received are 3 fragmen ts of red-tan soft tissue mixed with intestinal debris.  Ratio of soft tissue to intestinal debris is 90/10. Largest soft tissue fragment is inked at presumed biopsy margin and bisected. Entirely submitted in cassette 1.  B. Labeled: Cold snare polyp ascending colon x1, CBX polyp ascending colon x1 Received: In formalin Collection time: 10:05 AM on 07/07/2022 Placed into formalin time: 10:05 AM on 07/07/2022 Tissue fragment(s): Multiple Size: Aggregate, 1.1 x 0.7 x 0.1 cm Description: Received are multiple fragments of pale yellow-tan soft tissue. Entirely submitted in cassette 1.  BAS 07/07/2022   Final Diagnosis performed by Theodora Blow, MD.   Electronically signed 07/08/2022 12:14:12PM The electronic signature indicates that the named Attending Pathologist has evaluated the specimen Technical component performed at [facility], [address] [place], [place] [address] Lab: [phone] Dir: Rush Farmer, MD, MMM  Professional component performed at [facility], [place], [place], [place] [address] Lab: [phone] Dir: Kathi Simpers, MD     Radiology No results found.  Assessment/Plan  Atherosclerosis of native arteries of extremity with intermittent claudication ([place]) She had noninvasive studies performed yesterday which demonstrated a drop in her right ABI down to 0.7 with blunted waveforms on the right.  Her left ABI remains normal at 1 with good waveforms. She is already on appropriate medical therapy with aspirin, Plavix, and a statin agent.  Her symptoms are disabling and she has lost her job.  I would recommend proceeding with angiography.  I did discuss that her aortic stent graft will complicate the situation and make it more difficult to perform intervention concomitantly.  I discussed that we may have to do separate diagnostic angiography and then possible pedal approach or antegrade  approach for intervention depending on the location of the lesion.  The patient voices her understanding and is agreeable to proceed.  AAA (abdominal aortic aneurysm) ([place]) Repaired several years ago.  The aortic stent graft will complicate the treatment for the occlusive disease.  Essential hypertension, benign blood pressure control important in reducing the progression of atherosclerotic disease. On appropriate oral medications.   Diabetes ([place]) blood glucose control important in reducing the progression of atherosclerotic disease. Also, involved in wound healing. On appropriate medications.    [name], MD  09/02/2022 4:43 PM    This note was created with Dragon medical transcription system.  Any errors from dictation are purely unintentional

## 2022-09-02 NOTE — H&P (View-Only) (Signed)
MRN : [id number]  Heather Mahoney is a 56 y.o. ([birth year]) female who presents with chief complaint of  Chief Complaint  Patient presents with   Follow-up    Ultrasound results  .  History of Present Illness: Patient returns today in follow up of worsening right leg pain.  For several months now, she has been having worsening right lower extremity claudication symptoms.  She is now having pain at rest.  She denies any trauma, injury, or inciting event that started the symptoms.  She had a an aneurysm stent graft repair about 4 years ago and had to have a right femoral endarterectomy after the repair for occlusive disease.  Her legs were doing fine until a few months ago.  She still has no left leg symptoms.  She had noninvasive studies performed yesterday which demonstrated a drop in her right ABI down to 0.7 with blunted waveforms on the right.  Her left ABI remains normal at 1 with good waveforms.  Current Outpatient Medications  Medication Sig Dispense Refill   amLODipine (NORVASC) 10 MG tablet Take 1 tablet (10 mg total) by mouth daily. 90 tablet 3   aspirin 81 MG EC tablet Take 1 tablet (81 mg total) by mouth daily. 90 tablet 3   cloNIDine (CATAPRES) 0.2 MG tablet Take 1 tablet (0.2 mg total) by mouth in the morning and at bedtime. Take an extra 0.1 mg for BP >160 180 tablet 3   clopidogrel (PLAVIX) 75 MG tablet Take 1 tablet (75 mg total) by mouth daily. 90 tablet 3   cyclobenzaprine (FLEXERIL) 10 MG tablet Take 10 mg by mouth 3 (three) times daily.     empagliflozin (JARDIANCE) 10 MG TABS tablet Take by mouth daily.     ezetimibe (ZETIA) 10 MG tablet Take 1 tablet (10 mg total) by mouth daily. 90 tablet 4   gabapentin (NEURONTIN) 600 MG tablet Take 600 mg by mouth 3 (three) times daily as needed.     losartan (COZAAR) 100 MG tablet Take 1 tablet by mouth once daily 30 tablet 6   meloxicam (MOBIC) 7.5 MG tablet Take 7.5 mg by mouth 2 (two) times daily.     methocarbamol (ROBAXIN) 500  MG tablet Take 1,000 mg by mouth 4 (four) times daily.     metoprolol succinate (TOPROL-XL) 50 MG 24 hr tablet Take 1 tablet (50 mg total) by mouth daily. Take with or immediately following a meal. 90 tablet 3   nitroGLYCERIN (NITROSTAT) 0.4 MG SL tablet Place 1 tablet (0.4 mg total) under the tongue every 5 (five) minutes as needed for chest pain. 25 tablet 3   omeprazole (PRILOSEC) 20 MG capsule Take 1 capsule (20 mg total) by mouth 2 (two) times daily before a meal. 60 capsule 1   rosuvastatin (CRESTOR) 40 MG tablet Take 1 tablet (40 mg total) by mouth daily. 90 tablet 3   sucralfate (CARAFATE) 1 g tablet Take 1 tablet (1 g total) by mouth 4 (four) times daily -  with meals and at bedtime. 30 tablet 1   cholecalciferol (VITAMIN D3) 25 MCG (1000 UNIT) tablet Take 50,000 Units by mouth once a week. (Patient not taking: Reported on 08/26/2022)     D3-50 1.25 MG (50000 UT) capsule Take 50,000 Units by mouth once a week. (Patient not taking: Reported on 01/07/2022)     esomeprazole (NEXIUM) 40 MG capsule Take 40 mg by mouth daily. (Patient not taking: Reported on 08/26/2022)     hydrOXYzine (ATARAX/VISTARIL)  25 MG tablet Take 25 mg by mouth every 6 (six) hours as needed. (Patient not taking: Reported on 07/09/2021)     No current facility-administered medications for this visit.    Past Medical History:  Diagnosis Date   CAD (coronary artery disease)    a. PCI to proximal and distal RCA 3/26   GERD (gastroesophageal reflux disease)    Heart murmur    MR   Hiatal hernia    Hyperlipidemia    Hypertension    Myocardial infarction ([place])    Neuropathy    Tobacco abuse    a. Started at age 56, quit during 3 pregnancies. b. 1 PPD for a 40 pack-year hx (2019)     Past Surgical History:  Procedure Laterality Date   ABDOMINAL AORTIC ANEURYSM REPAIR     2019   BREAST BIOPSY Right    Multiple biopsies-all benign   BREAST SURGERY  1986   I&D for 'milk duct'   COLONOSCOPY WITH PROPOFOL N/A  07/07/2022   Procedure: COLONOSCOPY WITH PROPOFOL;  Surgeon: Jonathon Bellows, MD;  Location: [facility];  Service: Gastroenterology;  Laterality: N/A;   CORONARY STENT INTERVENTION N/A 01/13/2019   Procedure: CORONARY STENT INTERVENTION;  Surgeon: Nelva Bush, MD;  Location: [place] [facility] LAB;  Service: Cardiovascular;  Laterality: N/A;   EMBOLECTOMY  08/13/2018   Procedure: Right SFA and profunda femoris F[place]arty embolectomy 3  F[place]arty embolectomy balloon;  Surgeon: Algernon Huxley, MD;  Location: [facility] ORS;  Service: Vascular;;   ENDARTERECTOMY FEMORAL Right 08/13/2018   Procedure: Right common femoral, profunda femoris, and superficial femoral artery endarterectomies and patch angioplasty ;  Surgeon: Algernon Huxley, MD;  Location: [facility] ORS;  Service: Vascular;  Laterality: Right;   ENDOVASCULAR REPAIR/STENT GRAFT N/A 08/13/2018   Procedure: ENDOVASCULAR REPAIR/STENT GRAFT;  Surgeon: Katha Cabal, MD;  Location: [place] [facility];  Service: Cardiovascular;  Laterality: N/A;   LEFT HEART CATH AND CORONARY ANGIOGRAPHY Left 01/13/2019   Procedure: LEFT HEART CATH AND CORONARY ANGIOGRAPHY;  Surgeon: Minna Merritts, MD;  Location: [place] [facility];  Service: Cardiovascular;  Laterality: Left;   SALPINGOOPHORECTOMY Left 2000     Social History   Tobacco Use   Smoking status: Former    Packs/day: 0.25    Years: 38.00    Total pack years: 9.50    Types: Cigarettes    Quit date: 08/10/2018    Years since quitting: 4.0   Smokeless tobacco: Never  Vaping Use   Vaping Use: Never used  Substance Use Topics   Alcohol use: Not Currently    Comment: occassional,none last 24hrs   Drug use: Yes    Frequency: 2.0 times per week    Types: Marijuana    Comment: smokes Marijuana 'when I cant sleep, maybe once or twice a week'       Family History  Problem Relation Age of Onset   Breast cancer Mother    Diabetes Mother    Hypertension Mother    Hyperlipidemia Mother    Heart  disease Mother        CABG X 4   Asthma Mother 60   Sarcoidosis Mother        In remission   Heart attack Mother    Breast cancer Maternal Grandmother 90   Diabetes Maternal Grandmother    Heart disease Maternal Grandmother    Hyperlipidemia Maternal Grandmother      Allergies  Allergen Reactions   Strawberry Extract Hives and Swelling  Strawberry Flavor Rash     REVIEW OF SYSTEMS (Negative unless checked)  Constitutional: '[]'$ Weight loss  '[]'$ Fever  '[]'$ Chills Cardiac: '[]'$ Chest pain   '[]'$ Chest pressure   '[]'$ Palpitations   '[]'$ Shortness of breath when laying flat   '[]'$ Shortness of breath at rest   '[]'$ Shortness of breath with exertion. Vascular:  '[x]'$ Pain in legs with walking   '[x]'$ Pain in legs at rest   '[]'$ Pain in legs when laying flat   '[]'$ Claudication   '[]'$ Pain in feet when walking  '[]'$ Pain in feet at rest  '[]'$ Pain in feet when laying flat   '[]'$ History of DVT   '[]'$ Phlebitis   '[]'$ Swelling in legs   '[]'$ Varicose veins   '[]'$ Non-healing ulcers Pulmonary:   '[]'$ Uses home oxygen   '[]'$ Productive cough   '[]'$ Hemoptysis   '[]'$ Wheeze  '[]'$ COPD   '[]'$ Asthma Neurologic:  '[]'$ Dizziness  '[]'$ Blackouts   '[]'$ Seizures   '[]'$ History of stroke   '[]'$ History of TIA  '[]'$ Aphasia   '[]'$ Temporary blindness   '[]'$ Dysphagia   '[]'$ Weakness or numbness in arms   '[]'$ Weakness or numbness in legs Musculoskeletal:  '[x]'$ Arthritis   '[]'$ Joint swelling   '[x]'$ Joint pain   '[]'$ Low back pain Hematologic:  '[]'$ Easy bruising  '[]'$ Easy bleeding   '[]'$ Hypercoagulable state   '[]'$ Anemic   Gastrointestinal:  '[]'$ Blood in stool   '[]'$ Vomiting blood  '[x]'$ Gastroesophageal reflux/heartburn   '[]'$ Abdominal pain Genitourinary:  '[]'$ Chronic kidney disease   '[]'$ Difficult urination  '[]'$ Frequent urination  '[]'$ Burning with urination   '[]'$ Hematuria Skin:  '[]'$ Rashes   '[]'$ Ulcers   '[]'$ Wounds Psychological:  '[]'$ History of anxiety   '[]'$  History of major depression.  Physical Examination  BP (!) 202/120 (BP Location: Right Arm)   Pulse 83   Resp 16   Wt 176 lb (79.8 kg)   BMI 28.41 kg/m  Gen:  WD/WN, NAD Head:  [place]/AT, No temporalis wasting. Ear/Nose/Throat: Hearing grossly intact, nares w/o erythema or drainage Eyes: Conjunctiva clear. Sclera non-icteric Neck: Supple.  Trachea midline Pulmonary:  Good air movement, no use of accessory muscles.  Cardiac: RRR, no JVD Vascular:  Vessel Right Left  Radial Palpable Palpable                          PT 1+ palpable 2+ palpable  DP Trace palpable 2+ palpable   Gastrointestinal: soft, non-tender/non-distended. No guarding/reflex.  Musculoskeletal: M/S 5/5 throughout.  No deformity or atrophy.  No edema. Neurologic: Sensation grossly intact in extremities.  Symmetrical.  Speech is fluent.  Psychiatric: Judgment intact, Mood & affect appropriate for pt's clinical situation. Dermatologic: No rashes or ulcers noted.  No cellulitis or open wounds.      Labs Recent Results (from the past 2160 hour(s))  Surgical pathology     Status: None   Collection Time: 07/07/22  9:58 AM  Result Value Ref Range   SURGICAL PATHOLOGY      SURGICAL PATHOLOGY CASE: [id number] PATIENT: Murray Hodgkins Surgical Pathology Report     Specimen Submitted: A. Colon polyp x3, sigmoid; cold snare B. Colon polyp x2, asc; c snare(1) cbx(1)  Clinical History: Screening colonoscopy.  Colon polyps; diverticulosis      DIAGNOSIS: A.  COLON, SIGMOID, POLYP; COLD SNARE BIOPSIES: - FRAGMENTS OF SESSILE SERRATED POLYPS ADMIXED WITH FRAGMENTS OF BENIGN HYPERPLASTIC COLONIC MUCOSA. - NO EVIDENCE OF HIGH-GRADE DYSPLASIA OR MALIGNANCY.  B.  COLON, ASCENDING, POLYPS; BIOPSIES: - 1 FRAGMENT OF TUBULAR ADENOMA - MULTIPLE FRAGMENTS OF HYPERPLASTIC COLONIC MUCOSA WITH FOCAL REACTIVE NUCLEAR CHANGE. - NO EVIDENCE OF HIGH-GRADE DYSPLASIA OR MALIGNANCY.  GROSS DESCRIPTION: A. Labeled: Cold snare polyp sigmoid colon x3 Received: In formalin Collection time: 9:58 AM on 07/07/2022 Placed into formalin time: 9:58 AM on 07/07/2022 Tissue fragment(s): 3 Size: Aggregate, 1.4  x 1.0 x 0.1 cm Description: Received are 3 fragmen ts of red-tan soft tissue mixed with intestinal debris.  Ratio of soft tissue to intestinal debris is 90/10. Largest soft tissue fragment is inked at presumed biopsy margin and bisected. Entirely submitted in cassette 1.  B. Labeled: Cold snare polyp ascending colon x1, CBX polyp ascending colon x1 Received: In formalin Collection time: 10:05 AM on 07/07/2022 Placed into formalin time: 10:05 AM on 07/07/2022 Tissue fragment(s): Multiple Size: Aggregate, 1.1 x 0.7 x 0.1 cm Description: Received are multiple fragments of pale yellow-tan soft tissue. Entirely submitted in cassette 1.  BAS 07/07/2022   Final Diagnosis performed by Theodora Blow, MD.   Electronically signed 07/08/2022 12:14:12PM The electronic signature indicates that the named Attending Pathologist has evaluated the specimen Technical component performed at [facility], [address], [place], [place] [address] Lab: [phone] Dir: Rush Farmer, MD, MMM  Professional component performed at [facility], [facility], [place], [place], [place] [address] Lab: [phone] Dir: Kathi Simpers, MD     Radiology No results found.  Assessment/Plan  Atherosclerosis of native arteries of extremity with intermittent claudication ([place]) She had noninvasive studies performed yesterday which demonstrated a drop in her right ABI down to 0.7 with blunted waveforms on the right.  Her left ABI remains normal at 1 with good waveforms. She is already on appropriate medical therapy with aspirin, Plavix, and a statin agent.  Her symptoms are disabling and she has lost her job.  I would recommend proceeding with angiography.  I did discuss that her aortic stent graft will complicate the situation and make it more difficult to perform intervention concomitantly.  I discussed that we may have to do separate diagnostic angiography and then possible pedal approach or antegrade  approach for intervention depending on the location of the lesion.  The patient voices her understanding and is agreeable to proceed.  AAA (abdominal aortic aneurysm) ([place]) Repaired several years ago.  The aortic stent graft will complicate the treatment for the occlusive disease.  Essential hypertension, benign blood pressure control important in reducing the progression of atherosclerotic disease. On appropriate oral medications.   Diabetes ([place]) blood glucose control important in reducing the progression of atherosclerotic disease. Also, involved in wound healing. On appropriate medications.    [name], MD  09/02/2022 4:43 PM    This note was created with Dragon medical transcription system.  Any errors from dictation are purely unintentional

## 2022-09-02 NOTE — Assessment & Plan Note (Signed)
blood pressure control important in reducing the progression of atherosclerotic disease. On appropriate oral medications.  

## 2022-09-02 NOTE — H&P (View-Only) (Signed)
MRN : [id number]  Heather Mahoney is a 56 y.o. (11-Dec-1965) female who presents with chief complaint of  Chief Complaint  Patient presents with   Follow-up    Ultrasound results  .  History of Present Illness: Patient returns today in follow up of worsening right leg pain.  For several months now, she has been having worsening right lower extremity claudication symptoms.  She is now having pain at rest.  She denies any trauma, injury, or inciting event that started the symptoms.  She had a an aneurysm stent graft repair about 4 years ago and had to have a right femoral endarterectomy after the repair for occlusive disease.  Her legs were doing fine until a few months ago.  She still has no left leg symptoms.  She had noninvasive studies performed yesterday which demonstrated a drop in her right ABI down to 0.7 with blunted waveforms on the right.  Her left ABI remains normal at 1 with good waveforms.  Current Outpatient Medications  Medication Sig Dispense Refill   amLODipine (NORVASC) 10 MG tablet Take 1 tablet (10 mg total) by mouth daily. 90 tablet 3   aspirin 81 MG EC tablet Take 1 tablet (81 mg total) by mouth daily. 90 tablet 3   cloNIDine (CATAPRES) 0.2 MG tablet Take 1 tablet (0.2 mg total) by mouth in the morning and at bedtime. Take an extra 0.1 mg for BP >160 180 tablet 3   clopidogrel (PLAVIX) 75 MG tablet Take 1 tablet (75 mg total) by mouth daily. 90 tablet 3   cyclobenzaprine (FLEXERIL) 10 MG tablet Take 10 mg by mouth 3 (three) times daily.     empagliflozin (JARDIANCE) 10 MG TABS tablet Take by mouth daily.     ezetimibe (ZETIA) 10 MG tablet Take 1 tablet (10 mg total) by mouth daily. 90 tablet 4   gabapentin (NEURONTIN) 600 MG tablet Take 600 mg by mouth 3 (three) times daily as needed.     losartan (COZAAR) 100 MG tablet Take 1 tablet by mouth once daily 30 tablet 6   meloxicam (MOBIC) 7.5 MG tablet Take 7.5 mg by mouth 2 (two) times daily.     methocarbamol (ROBAXIN) 500  MG tablet Take 1,000 mg by mouth 4 (four) times daily.     metoprolol succinate (TOPROL-XL) 50 MG 24 hr tablet Take 1 tablet (50 mg total) by mouth daily. Take with or immediately following a meal. 90 tablet 3   nitroGLYCERIN (NITROSTAT) 0.4 MG SL tablet Place 1 tablet (0.4 mg total) under the tongue every 5 (five) minutes as needed for chest pain. 25 tablet 3   omeprazole (PRILOSEC) 20 MG capsule Take 1 capsule (20 mg total) by mouth 2 (two) times daily before a meal. 60 capsule 1   rosuvastatin (CRESTOR) 40 MG tablet Take 1 tablet (40 mg total) by mouth daily. 90 tablet 3   sucralfate (CARAFATE) 1 g tablet Take 1 tablet (1 g total) by mouth 4 (four) times daily -  with meals and at bedtime. 30 tablet 1   cholecalciferol (VITAMIN D3) 25 MCG (1000 UNIT) tablet Take 50,000 Units by mouth once a week. (Patient not taking: Reported on 08/26/2022)     D3-50 1.25 MG (50000 UT) capsule Take 50,000 Units by mouth once a week. (Patient not taking: Reported on 01/07/2022)     esomeprazole (NEXIUM) 40 MG capsule Take 40 mg by mouth daily. (Patient not taking: Reported on 08/26/2022)     hydrOXYzine (ATARAX/VISTARIL)  25 MG tablet Take 25 mg by mouth every 6 (six) hours as needed. (Patient not taking: Reported on 07/09/2021)     No current facility-administered medications for this visit.    Past Medical History:  Diagnosis Date   CAD (coronary artery disease)    a. PCI to proximal and distal RCA 3/26   GERD (gastroesophageal reflux disease)    Heart murmur    MR   Hiatal hernia    Hyperlipidemia    Hypertension    Myocardial infarction ([place])    Neuropathy    Tobacco abuse    a. Started at age 70, quit during 3 pregnancies. b. 1 PPD for a 40 pack-year hx (2019)     Past Surgical History:  Procedure Laterality Date   ABDOMINAL AORTIC ANEURYSM REPAIR     2019   BREAST BIOPSY Right    Multiple biopsies-all benign   BREAST SURGERY  1986   I&D for 'milk duct'   COLONOSCOPY WITH PROPOFOL N/A  07/07/2022   Procedure: COLONOSCOPY WITH PROPOFOL;  Surgeon: Jonathon Bellows, MD;  Location: [facility] ENDOSCOPY;  Service: Gastroenterology;  Laterality: N/A;   CORONARY STENT INTERVENTION N/A 01/13/2019   Procedure: CORONARY STENT INTERVENTION;  Surgeon: Nelva Bush, MD;  Location: [facility];  Service: Cardiovascular;  Laterality: N/A;   EMBOLECTOMY  08/13/2018   Procedure: Right SFA and profunda femoris Fogarty embolectomy 3  Fogarty embolectomy balloon;  Surgeon: Algernon Huxley, MD;  Location: [facility] ORS;  Service: Vascular;;   ENDARTERECTOMY FEMORAL Right 08/13/2018   Procedure: Right common femoral, profunda femoris, and superficial femoral artery endarterectomies and patch angioplasty ;  Surgeon: Algernon Huxley, MD;  Location: [facility] ORS;  Service: Vascular;  Laterality: Right;   ENDOVASCULAR REPAIR/STENT GRAFT N/A 08/13/2018   Procedure: ENDOVASCULAR REPAIR/STENT GRAFT;  Surgeon: Katha Cabal, MD;  Location: [facility];  Service: Cardiovascular;  Laterality: N/A;   LEFT HEART CATH AND CORONARY ANGIOGRAPHY Left 01/13/2019   Procedure: LEFT HEART CATH AND CORONARY ANGIOGRAPHY;  Surgeon: Minna Merritts, MD;  Location: [facility];  Service: Cardiovascular;  Laterality: Left;   SALPINGOOPHORECTOMY Left 2000     Social History   Tobacco Use   Smoking status: Former    Packs/day: 0.25    Years: 38.00    Total pack years: 9.50    Types: Cigarettes    Quit date: 08/10/2018    Years since quitting: 4.0   Smokeless tobacco: Never  Vaping Use   Vaping Use: Never used  Substance Use Topics   Alcohol use: Not Currently    Comment: occassional,none last 24hrs   Drug use: Yes    Frequency: 2.0 times per week    Types: Marijuana    Comment: smokes Marijuana 'when I cant sleep, maybe once or twice a week'       Family History  Problem Relation Age of Onset   Breast cancer Mother    Diabetes Mother    Hypertension Mother    Hyperlipidemia Mother    Heart  disease Mother        CABG X 4   Asthma Mother 45   Sarcoidosis Mother        In remission   Heart attack Mother    Breast cancer Maternal Grandmother 90   Diabetes Maternal Grandmother    Heart disease Maternal Grandmother    Hyperlipidemia Maternal Grandmother      Allergies  Allergen Reactions   Strawberry Extract Hives and Swelling  Strawberry Flavor Rash     REVIEW OF SYSTEMS (Negative unless checked)  Constitutional: '[]'$ Weight loss  '[]'$ Fever  '[]'$ Chills Cardiac: '[]'$ Chest pain   '[]'$ Chest pressure   '[]'$ Palpitations   '[]'$ Shortness of breath when laying flat   '[]'$ Shortness of breath at rest   '[]'$ Shortness of breath with exertion. Vascular:  '[x]'$ Pain in legs with walking   '[x]'$ Pain in legs at rest   '[]'$ Pain in legs when laying flat   '[]'$ Claudication   '[]'$ Pain in feet when walking  '[]'$ Pain in feet at rest  '[]'$ Pain in feet when laying flat   '[]'$ History of DVT   '[]'$ Phlebitis   '[]'$ Swelling in legs   '[]'$ Varicose veins   '[]'$ Non-healing ulcers Pulmonary:   '[]'$ Uses home oxygen   '[]'$ Productive cough   '[]'$ Hemoptysis   '[]'$ Wheeze  '[]'$ COPD   '[]'$ Asthma Neurologic:  '[]'$ Dizziness  '[]'$ Blackouts   '[]'$ Seizures   '[]'$ History of stroke   '[]'$ History of TIA  '[]'$ Aphasia   '[]'$ Temporary blindness   '[]'$ Dysphagia   '[]'$ Weakness or numbness in arms   '[]'$ Weakness or numbness in legs Musculoskeletal:  '[x]'$ Arthritis   '[]'$ Joint swelling   '[x]'$ Joint pain   '[]'$ Low back pain Hematologic:  '[]'$ Easy bruising  '[]'$ Easy bleeding   '[]'$ Hypercoagulable state   '[]'$ Anemic   Gastrointestinal:  '[]'$ Blood in stool   '[]'$ Vomiting blood  '[x]'$ Gastroesophageal reflux/heartburn   '[]'$ Abdominal pain Genitourinary:  '[]'$ Chronic kidney disease   '[]'$ Difficult urination  '[]'$ Frequent urination  '[]'$ Burning with urination   '[]'$ Hematuria Skin:  '[]'$ Rashes   '[]'$ Ulcers   '[]'$ Wounds Psychological:  '[]'$ History of anxiety   '[]'$  History of major depression.  Physical Examination  BP (!) 202/120 (BP Location: Right Arm)   Pulse 83   Resp 16   Wt 176 lb (79.8 kg)   BMI 28.41 kg/m  Gen:  WD/WN, NAD Head:  [place]/AT, No temporalis wasting. Ear/Nose/Throat: Hearing grossly intact, nares w/o erythema or drainage Eyes: Conjunctiva clear. Sclera non-icteric Neck: Supple.  Trachea midline Pulmonary:  Good air movement, no use of accessory muscles.  Cardiac: RRR, no JVD Vascular:  Vessel Right Left  Radial Palpable Palpable                          PT 1+ palpable 2+ palpable  DP Trace palpable 2+ palpable   Gastrointestinal: soft, non-tender/non-distended. No guarding/reflex.  Musculoskeletal: M/S 5/5 throughout.  No deformity or atrophy.  No edema. Neurologic: Sensation grossly intact in extremities.  Symmetrical.  Speech is fluent.  Psychiatric: Judgment intact, Mood & affect appropriate for pt's clinical situation. Dermatologic: No rashes or ulcers noted.  No cellulitis or open wounds.      Labs Recent Results (from the past 2160 hour(s))  Surgical pathology     Status: None   Collection Time: 07/07/22  9:58 AM  Result Value Ref Range   SURGICAL PATHOLOGY      SURGICAL PATHOLOGY CASE: [id number] PATIENT: Murray Hodgkins Surgical Pathology Report     Specimen Submitted: A. Colon polyp x3, sigmoid; cold snare B. Colon polyp x2, asc; c snare(1) cbx(1)  Clinical History: Screening colonoscopy.  Colon polyps; diverticulosis      DIAGNOSIS: A.  COLON, SIGMOID, POLYP; COLD SNARE BIOPSIES: - FRAGMENTS OF SESSILE SERRATED POLYPS ADMIXED WITH FRAGMENTS OF BENIGN HYPERPLASTIC COLONIC MUCOSA. - NO EVIDENCE OF HIGH-GRADE DYSPLASIA OR MALIGNANCY.  B.  COLON, ASCENDING, POLYPS; BIOPSIES: - 1 FRAGMENT OF TUBULAR ADENOMA - MULTIPLE FRAGMENTS OF HYPERPLASTIC COLONIC MUCOSA WITH FOCAL REACTIVE NUCLEAR CHANGE. - NO EVIDENCE OF HIGH-GRADE DYSPLASIA OR MALIGNANCY.  GROSS DESCRIPTION: A. Labeled: Cold snare polyp sigmoid colon x3 Received: In formalin Collection time: 9:58 AM on 07/07/2022 Placed into formalin time: 9:58 AM on 07/07/2022 Tissue fragment(s): 3 Size: Aggregate, 1.4  x 1.0 x 0.1 cm Description: Received are 3 fragmen ts of red-tan soft tissue mixed with intestinal debris.  Ratio of soft tissue to intestinal debris is 90/10. Largest soft tissue fragment is inked at presumed biopsy margin and bisected. Entirely submitted in cassette 1.  B. Labeled: Cold snare polyp ascending colon x1, CBX polyp ascending colon x1 Received: In formalin Collection time: 10:05 AM on 07/07/2022 Placed into formalin time: 10:05 AM on 07/07/2022 Tissue fragment(s): Multiple Size: Aggregate, 1.1 x 0.7 x 0.1 cm Description: Received are multiple fragments of pale yellow-tan soft tissue. Entirely submitted in cassette 1.  BAS 07/07/2022   Final Diagnosis performed by Theodora Blow, MD.   Electronically signed 07/08/2022 12:14:12PM The electronic signature indicates that the named Attending Pathologist has evaluated the specimen Technical component performed at [facility], [address] [place], [place] [address] Lab: [phone] Dir: Rush Farmer, MD, MMM  Professional component performed at [facility], [facility], [place] [place], [place], [place] [address] Lab: [phone] Dir: Kathi Simpers, MD     Radiology No results found.  Assessment/Plan  Atherosclerosis of native arteries of extremity with intermittent claudication ([place]) She had noninvasive studies performed yesterday which demonstrated a drop in her right ABI down to 0.7 with blunted waveforms on the right.  Her left ABI remains normal at 1 with good waveforms. She is already on appropriate medical therapy with aspirin, Plavix, and a statin agent.  Her symptoms are disabling and she has lost her job.  I would recommend proceeding with angiography.  I did discuss that her aortic stent graft will complicate the situation and make it more difficult to perform intervention concomitantly.  I discussed that we may have to do separate diagnostic angiography and then possible pedal approach or antegrade  approach for intervention depending on the location of the lesion.  The patient voices her understanding and is agreeable to proceed.  AAA (abdominal aortic aneurysm) ([place]) Repaired several years ago.  The aortic stent graft will complicate the treatment for the occlusive disease.  Essential hypertension, benign blood pressure control important in reducing the progression of atherosclerotic disease. On appropriate oral medications.   Diabetes ([place]) blood glucose control important in reducing the progression of atherosclerotic disease. Also, involved in wound healing. On appropriate medications.    [name], MD  09/02/2022 4:43 PM    This note was created with Dragon medical transcription system.  Any errors from dictation are purely unintentional

## 2022-09-02 NOTE — Assessment & Plan Note (Signed)
Repaired several years ago.  The aortic stent graft will complicate the treatment for the occlusive disease.

## 2022-09-02 NOTE — Assessment & Plan Note (Signed)
She had noninvasive studies performed yesterday which demonstrated a drop in her right ABI down to 0.7 with blunted waveforms on the right.  Her left ABI remains normal at 1 with good waveforms. She is already on appropriate medical therapy with aspirin, Plavix, and a statin agent.  Her symptoms are disabling and she has lost her job.  I would recommend proceeding with angiography.  I did discuss that her aortic stent graft will complicate the situation and make it more difficult to perform intervention concomitantly.  I discussed that we may have to do separate diagnostic angiography and then possible pedal approach or antegrade approach for intervention depending on the location of the lesion.  The patient voices her understanding and is agreeable to proceed.

## 2022-09-04 ENCOUNTER — Telehealth (INDEPENDENT_AMBULATORY_CARE_PROVIDER_SITE_OTHER): Payer: Self-pay

## 2022-09-04 NOTE — Telephone Encounter (Signed)
Spoke with the patient and she is scheduled with Dr. Lucky Cowboy for a right leg angio on 09/08/22 with a 8:15 am arrival time to the MM. Pre-procedure instructions were discussed and will be mailed.

## 2022-09-08 ENCOUNTER — Encounter
Admission: RE | Disposition: A | Discharge status: Home / Self Care | Payer: Self-pay | Source: Home / Self Care | Attending: Vascular Surgery

## 2022-09-08 ENCOUNTER — Other Ambulatory Visit: Payer: Self-pay

## 2022-09-08 ENCOUNTER — Ambulatory Visit
Admission: RE | Admit: 2022-09-08 | Discharge: 2022-09-08 | Disposition: A | Discharge status: Home / Self Care | Payer: 59 | Attending: Vascular Surgery | Admitting: Vascular Surgery

## 2022-09-08 ENCOUNTER — Encounter: Payer: Self-pay | Admitting: Vascular Surgery

## 2022-09-08 DIAGNOSIS — I714 Abdominal aortic aneurysm, without rupture, unspecified: Secondary | ICD-10-CM | POA: Insufficient documentation

## 2022-09-08 DIAGNOSIS — Z87891 Personal history of nicotine dependence: Secondary | ICD-10-CM | POA: Diagnosis not present

## 2022-09-08 DIAGNOSIS — I70211 Atherosclerosis of native arteries of extremities with intermittent claudication, right leg: Secondary | ICD-10-CM | POA: Insufficient documentation

## 2022-09-08 DIAGNOSIS — Z7984 Long term (current) use of oral hypoglycemic drugs: Secondary | ICD-10-CM | POA: Insufficient documentation

## 2022-09-08 DIAGNOSIS — I70202 Unspecified atherosclerosis of native arteries of extremities, left leg: Secondary | ICD-10-CM | POA: Diagnosis not present

## 2022-09-08 DIAGNOSIS — Z95828 Presence of other vascular implants and grafts: Secondary | ICD-10-CM | POA: Diagnosis not present

## 2022-09-08 DIAGNOSIS — Z9889 Other specified postprocedural states: Secondary | ICD-10-CM

## 2022-09-08 DIAGNOSIS — E1151 Type 2 diabetes mellitus with diabetic peripheral angiopathy without gangrene: Secondary | ICD-10-CM | POA: Insufficient documentation

## 2022-09-08 DIAGNOSIS — I10 Essential (primary) hypertension: Secondary | ICD-10-CM | POA: Insufficient documentation

## 2022-09-08 DIAGNOSIS — Z7982 Long term (current) use of aspirin: Secondary | ICD-10-CM | POA: Diagnosis not present

## 2022-09-08 DIAGNOSIS — Z7902 Long term (current) use of antithrombotics/antiplatelets: Secondary | ICD-10-CM | POA: Insufficient documentation

## 2022-09-08 DIAGNOSIS — I70219 Atherosclerosis of native arteries of extremities with intermittent claudication, unspecified extremity: Secondary | ICD-10-CM

## 2022-09-08 HISTORY — PX: LOWER EXTREMITY ANGIOGRAPHY: CATH118251

## 2022-09-08 LAB — GLUCOSE, CAPILLARY: Glucose-Capillary: 120 mg/dL — ABNORMAL HIGH (ref 70–99)

## 2022-09-08 LAB — CREATININE, SERUM
Creatinine, Ser: 0.74 mg/dL (ref 0.44–1.00)
GFR, Estimated: >60 mL/min (ref >60)

## 2022-09-08 LAB — BUN: BUN: 12 mg/dL (ref 6–20)

## 2022-09-08 SURGERY — LOWER EXTREMITY ANGIOGRAPHY
Anesthesia: Moderate Sedation | Site: Leg Lower | Laterality: Right

## 2022-09-08 MED ORDER — CEFAZOLIN SODIUM-DEXTROSE 2-4 GM/100ML-% IV SOLN
INTRAVENOUS | Status: AC
  Administered 2022-09-08: 2 g via INTRAVENOUS
  Filled 2022-09-08: qty 100

## 2022-09-08 MED ORDER — FAMOTIDINE 20 MG PO TABS: 40.0000 mg | ORAL_TABLET | Freq: Once | ORAL | Status: DC | PRN

## 2022-09-08 MED ORDER — HEPARIN SODIUM (PORCINE) 1000 UNIT/ML IJ SOLN
INTRAMUSCULAR | Status: AC
  Filled 2022-09-08: qty 10

## 2022-09-08 MED ORDER — METHYLPREDNISOLONE SODIUM SUCC 125 MG IJ SOLR: 125.0000 mg | Freq: Once | INTRAMUSCULAR | Status: DC | PRN

## 2022-09-08 MED ORDER — HYDROMORPHONE HCL 1 MG/ML IJ SOLN: 1.0000 mg | Freq: Once | INTRAMUSCULAR | Status: DC | PRN

## 2022-09-08 MED ORDER — MIDAZOLAM HCL 2 MG/2ML IJ SOLN
INTRAMUSCULAR | Status: DC | PRN
  Administered 2022-09-08: 1 mg via INTRAVENOUS
  Administered 2022-09-08: 2 mg via INTRAVENOUS

## 2022-09-08 MED ORDER — IODIXANOL 320 MG/ML IV SOLN
INTRAVENOUS | Status: DC | PRN
  Administered 2022-09-08: 40 mL

## 2022-09-08 MED ORDER — FENTANYL CITRATE (PF) 100 MCG/2ML IJ SOLN
INTRAMUSCULAR | Status: DC | PRN
  Administered 2022-09-08: 50 ug via INTRAVENOUS

## 2022-09-08 MED ORDER — FENTANYL CITRATE (PF) 100 MCG/2ML IJ SOLN
INTRAMUSCULAR | Status: AC
  Filled 2022-09-08: qty 2

## 2022-09-08 MED ORDER — CEFAZOLIN SODIUM-DEXTROSE 2-4 GM/100ML-% IV SOLN: 2.0000 g | INTRAVENOUS | Status: AC

## 2022-09-08 MED ORDER — ONDANSETRON HCL 4 MG/2ML IJ SOLN: 4.0000 mg | Freq: Four times a day (QID) | INTRAMUSCULAR | Status: DC | PRN

## 2022-09-08 MED ORDER — SODIUM CHLORIDE 0.9 % IV SOLN: INTRAVENOUS | Status: DC

## 2022-09-08 MED ORDER — ONDANSETRON HCL 4 MG/2ML IJ SOLN
INTRAMUSCULAR | Status: DC | PRN
  Administered 2022-09-08: 4 mg via INTRAVENOUS

## 2022-09-08 MED ORDER — MIDAZOLAM HCL 5 MG/5ML IJ SOLN
INTRAMUSCULAR | Status: AC
  Filled 2022-09-08: qty 5

## 2022-09-08 MED ORDER — MIDAZOLAM HCL 2 MG/ML PO SYRP: 8.0000 mg | ORAL_SOLUTION | Freq: Once | ORAL | Status: DC | PRN

## 2022-09-08 MED ORDER — DIPHENHYDRAMINE HCL 50 MG/ML IJ SOLN: 50.0000 mg | Freq: Once | INTRAMUSCULAR | Status: DC | PRN

## 2022-09-08 SURGICAL SUPPLY — 9 items
CATH ANGIO 5F PIGTAIL 65CM (CATHETERS) IMPLANT
CATH VS1 5X80 (CATHETERS) IMPLANT
COVER PROBE ULTRASOUND 5X96 (MISCELLANEOUS) IMPLANT
DEVICE STARCLOSE SE CLOSURE (Vascular Products) IMPLANT
PACK ANGIOGRAPHY (CUSTOM PROCEDURE TRAY) ×1 IMPLANT
SHEATH BRITE TIP 5FRX11 (SHEATH) IMPLANT
SYR MEDRAD MARK 7 150ML (SYRINGE) IMPLANT
TUBING CONTRAST HIGH PRESS 72 (TUBING) IMPLANT
WIRE GUIDERIGHT .035X150 (WIRE) IMPLANT

## 2022-09-08 NOTE — Interval H&P Note (Signed)
History and Physical Interval Note:  09/08/2022 9:11 AM  Heather Mahoney  has presented today for surgery, with the diagnosis of RLE Angio   BARD   ASO w claudication.  The various methods of treatment have been discussed with the patient and family. After consideration of risks, benefits and other options for treatment, the patient has consented to  Procedure(s): Lower Extremity Angiography (Right) as a surgical intervention.  The patient's history has been reviewed, patient examined, no change in status, stable for surgery.  I have reviewed the patient's chart and labs.  Questions were answered to the patient's satisfaction.     [name]

## 2022-09-08 NOTE — Op Note (Signed)
[facility]  Percutaneous Study/Intervention Procedural Note   Date of Surgery: 09/08/2022  Surgeon(s):Senovia Gauer    Assistants:none  Pre-operative Diagnosis: PAD with claudication RLE  Post-operative diagnosis:  Same  Procedure(s) Performed:             1.  Ultrasound guidance for vascular access left femoral artery             2.  Catheter placement into right limb of the aortic stent graft analogous to the right common iliac artery             3.  Aortogram and selective right lower extremity angiogram             4.  StarClose closure device left femoral artery  EBL: 5 cc  Contrast: 40 cc  Fluoro Time: 3.7 minutes  Moderate Conscious Sedation Time: approximately 26 minutes using 3 mg of Versed and 50 mcg of Fentanyl              Indications:  Patient is a 56 y.o.female with worsening claudication symptoms in the right leg which is now become disabling. The patient has noninvasive study showing reduced ABI on the right side with a fairly normal left ABI. The patient is brought in for angiography for further evaluation and potential treatment.   Risks and benefits are discussed and informed consent is obtained.   Procedure:  The patient was identified and appropriate procedural time out was performed.  The patient was then placed supine on the table and prepped and draped in the usual sterile fashion. Moderate conscious sedation was administered during a face to face encounter with the patient throughout the procedure with my supervision of the RN administering medicines and monitoring the patient's vital signs, pulse oximetry, telemetry and mental status throughout from the start of the procedure until the patient was taken to the recovery room. Ultrasound was used to evaluate the left common femoral artery.  It was patent .  A digital ultrasound image was acquired.  A Seldinger needle was used to access the left common femoral artery under direct ultrasound  guidance and a permanent image was performed.  A 0.035 J wire was advanced without resistance and a 5Fr sheath was placed.  Pigtail catheter was placed into the aorta and an AP aortogram was performed. This demonstrated normal renal arteries and a patent stent graft with the limbs crossed which would make up and over treatment and possible.  There was at least mild disease in both proximal external iliac arteries.  LAO projection was performed to evaluate the right side and this appeared to be about a 30 to 40% stenosis. I then used a V S1 catheter to hook the main body and get a catheter into what would be equivalent of the right common iliac artery to opacify the right lower extremity.  Selective right lower extremity angiogram was then performed. This demonstrated fairly normal common femoral artery, profunda femoris artery, and proximal superficial femoral artery with mild disease.  There was an occlusion in the mid SFA with reconstitution of the distal SFA.  The popliteal artery appeared fairly normal.  The tibials are somewhat difficult to opacify with the SFA occlusion and the proximal catheter location but the posterior tibial artery appeared to be the dominant runoff distally without focal stenosis.  The anterior tibial artery also appeared to be open although it was smaller and flow was more sluggish. It was clear this would not be able to be  addressed from an up and over approach with the cross limb configuration of the aortic stent graft.  Coming back from likely a pedal approach will be the easiest option.  An antegrade approach on the right is also an option but after previous femoral endarterectomy that may be more difficult.  We had discussed with the patient previously this would likely be a diagnostic study to determine our best treatment option.  I elected to terminate the procedure. The sheath was removed and StarClose closure device was deployed in the left femoral artery with excellent  hemostatic result. The patient was taken to the recovery room in stable condition having tolerated the procedure well.  Findings:               Aortogram:  This demonstrated normal renal arteries and a patent stent graft with the limbs crossed which would make up and over treatment and possible.  There was at least mild disease in both proximal external iliac arteries.  LAO projection was performed to evaluate the right side and this appeared to be about a 30 to 40% stenosis.             Right Lower Extremity:  This demonstrated fairly normal common femoral artery, profunda femoris artery, and proximal superficial femoral artery with mild disease.  There was an occlusion in the mid SFA with reconstitution of the distal SFA.  The popliteal artery appeared fairly normal.  The tibials are somewhat difficult to opacify with the SFA occlusion and the proximal catheter location but the posterior tibial artery appeared to be the dominant runoff distally without focal stenosis.  The anterior tibial artery also appeared to be open although it was smaller and flow was more sluggish.   Disposition: Patient was taken to the recovery room in stable condition having tolerated the procedure well.  Complications: None  [name] 09/08/2022 10:02 AM   This note was created with Dragon Medical transcription system. Any errors in dictation are purely unintentional.

## 2022-09-10 ENCOUNTER — Encounter: Payer: Self-pay | Admitting: Vascular Surgery

## 2022-09-15 ENCOUNTER — Encounter: Payer: Self-pay | Admitting: Vascular Surgery

## 2022-09-15 ENCOUNTER — Ambulatory Visit
Admission: RE | Admit: 2022-09-15 | Discharge: 2022-09-15 | Disposition: A | Discharge status: Home / Self Care | Payer: 59 | Attending: Vascular Surgery | Admitting: Vascular Surgery

## 2022-09-15 ENCOUNTER — Encounter
Admission: RE | Disposition: A | Discharge status: Home / Self Care | Payer: Self-pay | Source: Home / Self Care | Attending: Vascular Surgery

## 2022-09-15 DIAGNOSIS — Z9889 Other specified postprocedural states: Secondary | ICD-10-CM | POA: Diagnosis not present

## 2022-09-15 DIAGNOSIS — Z7984 Long term (current) use of oral hypoglycemic drugs: Secondary | ICD-10-CM | POA: Insufficient documentation

## 2022-09-15 DIAGNOSIS — I714 Abdominal aortic aneurysm, without rupture, unspecified: Secondary | ICD-10-CM | POA: Insufficient documentation

## 2022-09-15 DIAGNOSIS — Z95828 Presence of other vascular implants and grafts: Secondary | ICD-10-CM | POA: Diagnosis not present

## 2022-09-15 DIAGNOSIS — Z7982 Long term (current) use of aspirin: Secondary | ICD-10-CM | POA: Diagnosis not present

## 2022-09-15 DIAGNOSIS — E1151 Type 2 diabetes mellitus with diabetic peripheral angiopathy without gangrene: Secondary | ICD-10-CM | POA: Diagnosis present

## 2022-09-15 DIAGNOSIS — Z87891 Personal history of nicotine dependence: Secondary | ICD-10-CM | POA: Diagnosis not present

## 2022-09-15 DIAGNOSIS — Z7902 Long term (current) use of antithrombotics/antiplatelets: Secondary | ICD-10-CM | POA: Insufficient documentation

## 2022-09-15 DIAGNOSIS — I10 Essential (primary) hypertension: Secondary | ICD-10-CM | POA: Diagnosis not present

## 2022-09-15 DIAGNOSIS — I70219 Atherosclerosis of native arteries of extremities with intermittent claudication, unspecified extremity: Secondary | ICD-10-CM

## 2022-09-15 DIAGNOSIS — I70221 Atherosclerosis of native arteries of extremities with rest pain, right leg: Secondary | ICD-10-CM | POA: Insufficient documentation

## 2022-09-15 HISTORY — PX: LOWER EXTREMITY ANGIOGRAPHY: CATH118251

## 2022-09-15 LAB — GLUCOSE, CAPILLARY
Glucose-Capillary: 130 mg/dL — ABNORMAL HIGH (ref 70–99)
Glucose-Capillary: 122 mg/dL — ABNORMAL HIGH (ref 70–99)

## 2022-09-15 LAB — CREATININE, SERUM
Creatinine, Ser: 0.7 mg/dL (ref 0.44–1.00)
GFR, Estimated: >60 mL/min (ref >60)

## 2022-09-15 LAB — BUN: BUN: 16 mg/dL (ref 6–20)

## 2022-09-15 SURGERY — LOWER EXTREMITY ANGIOGRAPHY
Anesthesia: Moderate Sedation | Site: Leg Lower | Laterality: Right

## 2022-09-15 MED ORDER — ACETAMINOPHEN 325 MG PO TABS: 650.0000 mg | ORAL_TABLET | ORAL | Status: DC | PRN

## 2022-09-15 MED ORDER — MIDAZOLAM HCL 2 MG/2ML IJ SOLN
INTRAMUSCULAR | Status: DC | PRN
  Administered 2022-09-15: 2 mg via INTRAVENOUS
  Administered 2022-09-15 (×2): 1 mg via INTRAVENOUS

## 2022-09-15 MED ORDER — ONDANSETRON HCL 4 MG/2ML IJ SOLN: 4.0000 mg | Freq: Four times a day (QID) | INTRAMUSCULAR | Status: DC | PRN

## 2022-09-15 MED ORDER — LABETALOL HCL 5 MG/ML IV SOLN: 10.0000 mg | INTRAVENOUS | Status: DC | PRN

## 2022-09-15 MED ORDER — SODIUM CHLORIDE 0.9 % IV SOLN: INTRAVENOUS | Status: DC

## 2022-09-15 MED ORDER — HEPARIN SODIUM (PORCINE) 1000 UNIT/ML IJ SOLN
INTRAMUSCULAR | Status: DC | PRN
  Administered 2022-09-15: 4000 [IU] via INTRAVENOUS

## 2022-09-15 MED ORDER — SODIUM CHLORIDE 0.9% FLUSH: 3.0000 mL | INTRAVENOUS | Status: DC | PRN

## 2022-09-15 MED ORDER — METHYLPREDNISOLONE SODIUM SUCC 125 MG IJ SOLR: 125.0000 mg | Freq: Once | INTRAMUSCULAR | Status: DC | PRN

## 2022-09-15 MED ORDER — HYDRALAZINE HCL 20 MG/ML IJ SOLN: 5.0000 mg | INTRAMUSCULAR | Status: DC | PRN

## 2022-09-15 MED ORDER — IODIXANOL 320 MG/ML IV SOLN
INTRAVENOUS | Status: DC | PRN
  Administered 2022-09-15: 40 mL via INTRA_ARTERIAL

## 2022-09-15 MED ORDER — DIPHENHYDRAMINE HCL 50 MG/ML IJ SOLN: 50.0000 mg | Freq: Once | INTRAMUSCULAR | Status: DC | PRN

## 2022-09-15 MED ORDER — FENTANYL CITRATE (PF) 100 MCG/2ML IJ SOLN
INTRAMUSCULAR | Status: AC
  Filled 2022-09-15: qty 4

## 2022-09-15 MED ORDER — CEFAZOLIN SODIUM-DEXTROSE 2-4 GM/100ML-% IV SOLN
INTRAVENOUS | Status: AC
  Administered 2022-09-15: 2 g via INTRAVENOUS
  Filled 2022-09-15: qty 100

## 2022-09-15 MED ORDER — FENTANYL CITRATE (PF) 100 MCG/2ML IJ SOLN
INTRAMUSCULAR | Status: DC | PRN
  Administered 2022-09-15 (×3): 25 ug via INTRAVENOUS
  Administered 2022-09-15: 50 ug via INTRAVENOUS

## 2022-09-15 MED ORDER — HEPARIN SODIUM (PORCINE) 1000 UNIT/ML IJ SOLN
INTRAMUSCULAR | Status: AC
  Filled 2022-09-15: qty 10

## 2022-09-15 MED ORDER — HYDROMORPHONE HCL 1 MG/ML IJ SOLN: 1.0000 mg | Freq: Once | INTRAMUSCULAR | Status: DC | PRN

## 2022-09-15 MED ORDER — CEFAZOLIN SODIUM-DEXTROSE 2-4 GM/100ML-% IV SOLN: 2.0000 g | INTRAVENOUS | Status: AC

## 2022-09-15 MED ORDER — SODIUM CHLORIDE 0.9% FLUSH: 3.0000 mL | Freq: Two times a day (BID) | INTRAVENOUS | Status: DC

## 2022-09-15 MED ORDER — MIDAZOLAM HCL 2 MG/ML PO SYRP: 8.0000 mg | ORAL_SOLUTION | Freq: Once | ORAL | Status: DC | PRN

## 2022-09-15 MED ORDER — FAMOTIDINE 20 MG PO TABS: 40.0000 mg | ORAL_TABLET | Freq: Once | ORAL | Status: DC | PRN

## 2022-09-15 MED ORDER — MIDAZOLAM HCL 5 MG/5ML IJ SOLN
INTRAMUSCULAR | Status: AC
  Filled 2022-09-15: qty 5

## 2022-09-15 MED ORDER — SODIUM CHLORIDE 0.9 % IV SOLN: 250.0000 mL | INTRAVENOUS | Status: DC | PRN

## 2022-09-15 SURGICAL SUPPLY — 23 items
BALLN LUTONIX 018 4X100X130 (BALLOONS) ×1
BALLN LUTONIX 018 4X220X130 (BALLOONS) ×1
BALLN ULTRVRSE 2X220X150 (BALLOONS) ×1
BALLOON LUTONIX 018 4X100X130 (BALLOONS) IMPLANT
BALLOON LUTONIX 018 4X220X130 (BALLOONS) IMPLANT
BALLOON ULTRVRSE 2X220X150 (BALLOONS) IMPLANT
BAND ZEPHYR COMPRESS 30 LONG (HEMOSTASIS) IMPLANT
CATH BEACON 5 .035 65 KMP TIP (CATHETERS) IMPLANT
CATH NAVICROSS ANGLED 90CM (MICROCATHETER) IMPLANT
COVER PROBE ULTRASOUND 5X96 (MISCELLANEOUS) IMPLANT
DRAPE FEMORAL ANGIO W/ POUCH (DRAPES) IMPLANT
DRAPE INCISE IOBAN 66X45 STRL (DRAPES) IMPLANT
GLIDEWIRE ADV .035X260CM (WIRE) IMPLANT
GUIDEWIRE PFTE-COATED .018X300 (WIRE) IMPLANT
KIT ENCORE 26 ADVANTAGE (KITS) IMPLANT
NDL ENTRY 21GA 7CM ECHOTIP (NEEDLE) IMPLANT
NEEDLE ENTRY 21GA 7CM ECHOTIP (NEEDLE) ×1 IMPLANT
PACK ANGIOGRAPHY (CUSTOM PROCEDURE TRAY) ×1 IMPLANT
SHEATH HALO 035 5FRX10 (SHEATH) IMPLANT
SHEATH MICROPUNCTURE PEDAL 4FR (SHEATH) IMPLANT
STENT LIFESTENT 5F 6X100X135 (Permanent Stent) IMPLANT
WIRE G V18X300CM (WIRE) IMPLANT
WIRE GUIDERIGHT .035X150 (WIRE) IMPLANT

## 2022-09-15 NOTE — Op Note (Signed)
[facility]  Percutaneous Study/Intervention Procedural Note   Date of Surgery: 09/15/2022  Surgeon(s):Cyruss Arata    Assistants:none  Pre-operative Diagnosis: PAD with claudication and early rest pain right lower extremity  Post-operative diagnosis:  Same  Procedure(s) Performed:             1.  Ultrasound guidance for vascular access right anterior tibial artery             2.  Catheter placement into right common femoral artery from right anterior tibial artery             3.  Selective right lower extremity angiogram             4.  Percutaneous transluminal angioplasty of right anterior tibial artery with 2 mm diameter by 22 cm length angioplasty balloon             5.  Percutaneous transluminal angioplasty of right SFA with 4 mm diameter by 22 cm length Lutonix drug-coated angioplasty balloon  6.  Stent placement to the right SFA with 6 mm diameter by 10 cm length life stent             7.  TR band placement right anterior tibial artery  EBL: 3 cc  Contrast: 40 cc  Fluoro Time: 8.2 minutes  Moderate Conscious Sedation Time: approximately 64 minutes using 4 mg of Versed and 125 mcg of Fentanyl              Indications:  Patient is a 56 y.o.female with disabling claudication symptoms and early rest pain of the right leg. The patient has had previous noninvasive studies and an angiogram which showed significant disease but cannot be treated from a conventional approach due to her previous stent graft placement.  Tibial vessels were somewhat difficult to opacify distally.  The patient is brought in for angiography for further evaluation and potential treatment.  Due to the limb threatening nature of the situation, angiogram was performed for attempted limb salvage. The patient is aware that if the procedure fails, amputation would be expected.  The patient also understands that even with successful revascularization, amputation may still be required due to the  severity of the situation.  Risks and benefits are discussed and informed consent is obtained.   Procedure:  The patient was identified and appropriate procedural time out was performed.  The patient was then placed supine on the table and prepped and draped in the usual sterile fashion. Moderate conscious sedation was administered during a face to face encounter with the patient throughout the procedure with my supervision of the RN administering medicines and monitoring the patient's vital signs, pulse oximetry, telemetry and mental status throughout from the start of the procedure until the patient was taken to the recovery room. Ultrasound was used to evaluate posterior tibial artery but I was not able to gain access to this.  It was a very small vessel.  I turned my attention to the dorsalis pedis artery and tediously I was able to gain access to this with ultrasound guidance and a micropuncture needle.  A micropuncture wire and sheath were then placed.  An image through this showed occlusion of the anterior tibial artery proximally and it was very difficult to visualize much flow due to the small vessel.  I then upsized to a 5 French halo sheath and used a 0.018 advantage wire and a Nava cross catheter to cross the anterior tibial artery and SFA and get  up into the common femoral artery.  Selective right lower extremity angiogram was then performed. This demonstrated that the common femoral artery and profunda femoris artery appeared to be patent without stenosis.  There is an occlusion in the mid SFA with reconstitution of the distal SFA.  The popliteal artery was small but not obviously stenotic.  The proximal anterior tibial artery was occluded over a short segment with reconstitution of the mid anterior tibial artery.  This was very small vessel.  The posterior tibial artery and tibioperoneal trunk appeared to be patent.  The peroneal artery was occluded. It was felt that it was in the patient's best  interest to proceed with intervention after these images to avoid a second procedure and a larger amount of contrast and fluoroscopy based off of the findings from the initial angiogram. The patient was systemically heparinized.  A 2 mm diameter by 22 cm length angioplasty balloon was used to inflate the anterior tibial artery and taken up to 10 atm for 1 minute in the proximal and mid segments of the anterior tibial artery.  A 4 mm diameter by 22 cm length Lutonix drug-coated angioplasty balloon was then used to treat the right SFA occlusion and taken up to 8 atm for 1 minute.  Completion imaging showed greater than 50% residual stenosis in the SFA at the area of the occlusion and I elected to treat this with a 6 mm diameter by 10 cm length life stent postdilated with a 4 mm diameter Lutonix drug-coated balloon with excellent angiographic completion result and less than 10% residual stenosis.  I elected to terminate the procedure. The sheath was removed and TR band was placed at the access site with excellent hemostatic result. The patient was taken to the recovery room in stable condition having tolerated the procedure well.  Findings:                         Right Lower Extremity: The common femoral artery and profunda femoris artery appeared to be patent without stenosis.  There is an occlusion in the mid SFA with reconstitution of the distal SFA.  The popliteal artery was small but not obviously stenotic.  The proximal anterior tibial artery was occluded over a short segment with reconstitution of the mid anterior tibial artery.  This was very small vessel.  The posterior tibial artery and tibioperoneal trunk appeared to be patent.  The peroneal artery was occluded.   Disposition: Patient was taken to the recovery room in stable condition having tolerated the procedure well.  Complications: None  [name] 09/15/2022 9:37 AM   This note was created with Dragon Medical transcription system. Any  errors in dictation are purely unintentional.

## 2022-09-15 NOTE — Progress Notes (Signed)
Dr. Lucky Cowboy at bedside, speaking with pt. Re: procedural results. Pt. Verbalized understanding of conversation.

## 2022-09-15 NOTE — Interval H&P Note (Signed)
History and Physical Interval Note:  09/15/2022 8:16 AM  Heather Mahoney  has presented today for surgery, with the diagnosis of RLE angio w pedal approach w R SFA intervention   ASO w claudication.  The various methods of treatment have been discussed with the patient and family. After consideration of risks, benefits and other options for treatment, the patient has consented to  Procedure(s): Lower Extremity Angiography (Right) as a surgical intervention.  The patient's history has been reviewed, patient examined, no change in status, stable for surgery.  I have reviewed the patient's chart and labs.  Questions were answered to the patient's satisfaction.     [name]

## 2022-09-16 ENCOUNTER — Encounter: Payer: Self-pay | Admitting: Vascular Surgery

## 2022-10-09 ENCOUNTER — Other Ambulatory Visit (INDEPENDENT_AMBULATORY_CARE_PROVIDER_SITE_OTHER): Payer: Self-pay | Admitting: Vascular Surgery

## 2022-10-09 DIAGNOSIS — I739 Peripheral vascular disease, unspecified: Secondary | ICD-10-CM

## 2022-10-15 ENCOUNTER — Ambulatory Visit (INDEPENDENT_AMBULATORY_CARE_PROVIDER_SITE_OTHER): Payer: 59 | Admitting: Nurse Practitioner

## 2022-10-15 ENCOUNTER — Encounter (INDEPENDENT_AMBULATORY_CARE_PROVIDER_SITE_OTHER): Payer: Self-pay

## 2022-10-15 ENCOUNTER — Ambulatory Visit (INDEPENDENT_AMBULATORY_CARE_PROVIDER_SITE_OTHER): Payer: 59

## 2022-10-15 DIAGNOSIS — Z9889 Other specified postprocedural states: Secondary | ICD-10-CM

## 2022-10-15 DIAGNOSIS — I739 Peripheral vascular disease, unspecified: Secondary | ICD-10-CM | POA: Diagnosis not present

## 2022-11-03 ENCOUNTER — Ambulatory Visit (INDEPENDENT_AMBULATORY_CARE_PROVIDER_SITE_OTHER): Payer: 59 | Admitting: Nurse Practitioner

## 2022-11-03 ENCOUNTER — Encounter (INDEPENDENT_AMBULATORY_CARE_PROVIDER_SITE_OTHER): Payer: Self-pay | Admitting: Nurse Practitioner

## 2022-11-03 VITALS — BP 123/93 | HR 67 | Ht 66.0 in | Wt 173.0 lb

## 2022-11-03 DIAGNOSIS — I739 Peripheral vascular disease, unspecified: Secondary | ICD-10-CM | POA: Diagnosis not present

## 2022-11-03 DIAGNOSIS — Z9889 Other specified postprocedural states: Secondary | ICD-10-CM

## 2022-11-03 DIAGNOSIS — E1159 Type 2 diabetes mellitus with other circulatory complications: Secondary | ICD-10-CM

## 2022-11-03 DIAGNOSIS — G8929 Other chronic pain: Secondary | ICD-10-CM

## 2022-11-03 DIAGNOSIS — M545 Low back pain, unspecified: Secondary | ICD-10-CM

## 2022-11-03 DIAGNOSIS — I10 Essential (primary) hypertension: Secondary | ICD-10-CM

## 2022-11-04 ENCOUNTER — Encounter (INDEPENDENT_AMBULATORY_CARE_PROVIDER_SITE_OTHER): Payer: Self-pay | Admitting: Nurse Practitioner

## 2022-11-10 NOTE — Progress Notes (Signed)
Referring Physician:  Kris Hartmann, NP [address] [place],  [place] [address]  Primary Physician:  [place]  History of Present Illness: 11/14/2022 [name] has a history of CAD, GERD, heart murmur, hiatal hernia, hyperlipidemia, HTN, MI, PAD, and DM.   Had vascular procedure (stenting of right SFA) with Dr. Lucky Cowboy on 09/15/22.   She has constant LBP with chronic constant right leg pain from medial groin to knee and constant right knee pain to her ankle (started after stent was placed). No left leg pain. She feels like her spine is crooked. Pain is worse with walking. Better with sitting/rest. She has numbness and tingling in right leg with intermittent weakness.   She is taking neurontin. She has some relief with this.   She is on PLAVIX.   Bowel/Bladder Dysfunction: none  No neck pain. She has intermittent numbness/tingling in her hands. She has bilateral middle finger trigger fingers. She notes dexterity issues and balance issues.   Conservative measures:  Physical therapy: no  Multimodal medical therapy including regular antiinflammatories: mobic, robaxin, neurontin  Injections: no epidural steroid injections  Past Surgery: None  Heather Mahoney has symptoms of cervical myelopathy. She notes balance issues and dexterity issues with her hands.   The symptoms are causing a significant impact on the patient's life.   Review of Systems:  A 10 point review of systems is negative, except for the pertinent positives and negatives detailed in the HPI.  Past Medical History: Past Medical History:  Diagnosis Date   CAD (coronary artery disease)    a. PCI to proximal and distal RCA 3/26   GERD (gastroesophageal reflux disease)    Heart murmur    MR   Hiatal hernia    Hyperlipidemia    Hypertension    Myocardial infarction ([place])    Neuropathy    Tobacco abuse    a. Started at age 17, quit during 3 pregnancies. b. 1 PPD for a 40  pack-year hx (2019)     Past Surgical History: Past Surgical History:  Procedure Laterality Date   ABDOMINAL AORTIC ANEURYSM REPAIR     2019   BREAST BIOPSY Right    Multiple biopsies-all benign   BREAST SURGERY  1986   I&D for 'milk duct'   COLONOSCOPY WITH PROPOFOL N/A 07/07/2022   Procedure: COLONOSCOPY WITH PROPOFOL;  Surgeon: Jonathon Bellows, MD;  Location: [facility] ENDOSCOPY;  Service: Gastroenterology;  Laterality: N/A;   CORONARY STENT INTERVENTION N/A 01/13/2019   Procedure: CORONARY STENT INTERVENTION;  Surgeon: Nelva Bush, MD;  Location: [facility];  Service: Cardiovascular;  Laterality: N/A;   EMBOLECTOMY  08/13/2018   Procedure: Right SFA and profunda femoris Fogarty embolectomy 3  Fogarty embolectomy balloon;  Surgeon: Algernon Huxley, MD;  Location: [facility] ORS;  Service: Vascular;;   ENDARTERECTOMY FEMORAL Right 08/13/2018   Procedure: Right common femoral, profunda femoris, and superficial femoral artery endarterectomies and patch angioplasty ;  Surgeon: Algernon Huxley, MD;  Location: [facility] ORS;  Service: Vascular;  Laterality: Right;   ENDOVASCULAR REPAIR/STENT GRAFT N/A 08/13/2018   Procedure: ENDOVASCULAR REPAIR/STENT GRAFT;  Surgeon: Katha Cabal, MD;  Location: [place] [facility];  Service: Cardiovascular;  Laterality: N/A;   LEFT HEART CATH AND CORONARY ANGIOGRAPHY Left 01/13/2019   Procedure: LEFT HEART CATH AND CORONARY ANGIOGRAPHY;  Surgeon: Minna Merritts, MD;  Location: [facility];  Service: Cardiovascular;  Laterality: Left;   LOWER EXTREMITY ANGIOGRAPHY  Right 09/08/2022   Procedure: Lower Extremity Angiography;  Surgeon: Algernon Huxley, MD;  Location: [facility];  Service: Cardiovascular;  Laterality: Right;   LOWER EXTREMITY ANGIOGRAPHY Right 09/15/2022   Procedure: Lower Extremity Angiography;  Surgeon: Algernon Huxley, MD;  Location: [facility];  Service: Cardiovascular;  Laterality: Right;   SALPINGOOPHORECTOMY Left 2000     Allergies: Allergies as of 11/14/2022 - Review Complete 11/04/2022  Allergen Reaction Noted   Strawberry extract Hives and Swelling 04/25/2015   Strawberry flavor Rash 01/05/2018    Medications: Outpatient Encounter Medications as of 11/14/2022  Medication Sig   amLODipine (NORVASC) 10 MG tablet Take 1 tablet (10 mg total) by mouth daily.   aspirin 81 MG EC tablet Take 1 tablet (81 mg total) by mouth daily.   cloNIDine (CATAPRES) 0.2 MG tablet Take 1 tablet (0.2 mg total) by mouth in the morning and at bedtime. Take an extra 0.1 mg for BP >160   clopidogrel (PLAVIX) 75 MG tablet Take 1 tablet (75 mg total) by mouth daily.   empagliflozin (JARDIANCE) 10 MG TABS tablet Take by mouth daily.   ezetimibe (ZETIA) 10 MG tablet Take 1 tablet (10 mg total) by mouth daily.   gabapentin (NEURONTIN) 600 MG tablet Take 600 mg by mouth 3 (three) times daily as needed.   losartan (COZAAR) 100 MG tablet Take 1 tablet by mouth once daily   meloxicam (MOBIC) 7.5 MG tablet Take 7.5 mg by mouth 2 (two) times daily.   methocarbamol (ROBAXIN) 500 MG tablet Take 1,000 mg by mouth 4 (four) times daily.   metoprolol succinate (TOPROL-XL) 50 MG 24 hr tablet Take 1 tablet (50 mg total) by mouth daily. Take with or immediately following a meal.   omeprazole (PRILOSEC) 20 MG capsule Take 1 capsule (20 mg total) by mouth 2 (two) times daily before a meal.   rosuvastatin (CRESTOR) 40 MG tablet Take 1 tablet (40 mg total) by mouth daily.   No facility-administered encounter medications on file as of 11/14/2022.    Social History: Social History   Tobacco Use   Smoking status: Former    Packs/day: 0.25    Years: 38.00    Total pack years: 9.50    Types: Cigarettes    Quit date: 08/10/2018    Years since quitting: 4.2   Smokeless tobacco: Never  Vaping Use   Vaping Use: Never used  Substance Use Topics   Alcohol use: Not Currently    Comment: occassional,none last 24hrs   Drug use: Yes    Frequency:  2.0 times per week    Types: Marijuana    Comment: smokes Marijuana 'when I cant sleep, maybe once or twice a week'    Family Medical History: Family History  Problem Relation Age of Onset   Breast cancer Mother    Diabetes Mother    Hypertension Mother    Hyperlipidemia Mother    Heart disease Mother        CABG X 4   Asthma Mother 39   Sarcoidosis Mother        In remission   Heart attack Mother    Breast cancer Maternal Grandmother 90   Diabetes Maternal Grandmother    Heart disease Maternal Grandmother    Hyperlipidemia Maternal Grandmother     Physical Examination: There were no vitals filed for this visit.  General: Patient is well developed, well nourished, calm, collected, and in no apparent distress. Attention to examination is appropriate.  Respiratory: Patient is breathing without any difficulty.   NEUROLOGICAL:     Awake, alert, oriented to person, place, and time.  Speech is clear and fluent. Fund of knowledge is appropriate.   Cranial Nerves: Pupils equal round and reactive to light.  Facial tone is symmetric.    No posterior cervical tenderness. No tenderness in bilateral trapezial region.   Limited ROM of lumbar spine with pain Diffuse posterior lumbar tenderness.   No abnormal lesions on exposed skin.   Strength: Side Biceps Triceps Deltoid Interossei Grip Wrist Ext. Wrist Flex.  R '5 5 5 '$ 4+ '5 5 5  '$ L '5 5 5 5 5 5 5   '$ Side Iliopsoas Quads Hamstring PF DF EHL  R '5 5 5 5 5 5  '$ L '5 5 5 5 5 5   '$ Reflexes are 2+ and symmetric at the biceps, triceps, brachioradialis, patella and achilles.   Hoffman's is absent.  Clonus is not present.   Bilateral upper and lower extremity sensation is intact to light touch.     She has slow gait and has significant difficulty with tandem gait.    Medical Decision Making  Imaging: No cervical or lumbar imaging.   Assessment and Plan: Heather Mahoney is a pleasant 57 y.o. female has constant LBP with chronic constant  right leg pain from medial groin to knee and constant right knee pain to her ankle (started after stent was placed). No left leg pain.  Pain is worse with walking. Better with sitting/rest. She has numbness and tingling in right leg with intermittent weakness.   Also with intermittent numbness/tingling in her hands. She notes dexterity issues and balance issues.   She has no cervical or lumbar imaging has been done.   Treatment options discussed with patient and following plan made:   - Continue current medications including neurontin from other providers.  - Of note, she is on PLAVIX.  - MRI of lumbar spine to further evaluate right lumbar radiculopathy. No improvement time or medications (neurontin). - MRI of cervical spine to evaluate for problems with hand dexterity, balance issues, and right hand weakness. She has 4+ strength in right interossei and has difficulty with tandem walking.   - Referral to ortho at [facility] for bilateral middle trigger fingers.   - Will schedule follow up with me to review MRI results once I have them back.   I spent a total of 35 minutes in face-to-face and non-face-to-face activities related to this patient's care today including review of outside records, review of imaging, review of symptoms, physical exam, discussion of differential diagnosis, discussion of treatment options, and documentation.   Thank you for involving me in the care of this patient.   Geronimo Boot PA-C Dept. of Neurosurgery

## 2022-11-14 ENCOUNTER — Ambulatory Visit (INDEPENDENT_AMBULATORY_CARE_PROVIDER_SITE_OTHER): Payer: 59 | Admitting: Orthopedic Surgery

## 2022-11-14 ENCOUNTER — Encounter: Payer: Self-pay | Admitting: Orthopedic Surgery

## 2022-11-14 VITALS — BP 115/79 | HR 69 | Ht 66.0 in | Wt 169.6 lb

## 2022-11-14 DIAGNOSIS — G8929 Other chronic pain: Secondary | ICD-10-CM

## 2022-11-14 DIAGNOSIS — R2689 Other abnormalities of gait and mobility: Secondary | ICD-10-CM | POA: Diagnosis not present

## 2022-11-14 DIAGNOSIS — R29898 Other symptoms and signs involving the musculoskeletal system: Secondary | ICD-10-CM

## 2022-11-14 DIAGNOSIS — M5416 Radiculopathy, lumbar region: Secondary | ICD-10-CM | POA: Diagnosis not present

## 2022-11-14 DIAGNOSIS — M65331 Trigger finger, right middle finger: Secondary | ICD-10-CM

## 2022-11-14 DIAGNOSIS — M65332 Trigger finger, left middle finger: Secondary | ICD-10-CM

## 2022-11-14 DIAGNOSIS — M5441 Lumbago with sciatica, right side: Secondary | ICD-10-CM | POA: Diagnosis not present

## 2022-11-14 NOTE — Patient Instructions (Signed)
It was so nice to see you today. Thank you so much for coming in.    I want to get an MRI of your neck and lower back to look into things further. We will get this approved through your insurance and [facility] will call you to schedule the appointment.   I put in a referral for you to see ortho at the [facility] for your hands. They should call you to schedule. You can call them at [phone] if you don't hear from them.  I will see you back in after your MRI is done- we will call you to schedule this. Please do not hesitate to call if you have any questions or concerns. You can also message me in [place].   If you have not heard back about any of the above things in the next week, please call the office so we can help you get them scheduled.   Geronimo Boot PA-C [phone]

## 2022-11-17 NOTE — Progress Notes (Signed)
Subjective:    Patient ID: Heather Mahoney, female    DOB: 1966-01-18, 57 y.o.   MRN: [id number] Chief Complaint  Patient presents with   Follow-up    The patient returns to the office for followup and review status post angiogram with intervention on 09/15/2022.   Procedure: Procedure(s) Performed:             1.  Ultrasound guidance for vascular access right anterior tibial artery             2.  Catheter placement into right common femoral artery from right anterior tibial artery             3.  Selective right lower extremity angiogram             4.  Percutaneous transluminal angioplasty of right anterior tibial artery with 2 mm diameter by 22 cm length angioplasty balloon             5.  Percutaneous transluminal angioplasty of right SFA with 4 mm diameter by 22 cm length Lutonix drug-coated angioplasty balloon             6.  Stent placement to the right SFA with 6 mm diameter by 10 cm length life stent             7.  TR band placement right anterior tibial artery   The patient notes improvement in the lower extremity symptoms, however she still has significant weakness in her lower extremities.. No interval shortening of the patient's claudication distance or rest pain symptoms. No new ulcers or wounds have occurred since the last visit.  There have been no significant changes to the patient's overall health care.  No documented history of amaurosis fugax or recent TIA symptoms. There are no recent neurological changes noted. No documented history of DVT, PE or superficial thrombophlebitis. The patient denies recent episodes of angina or shortness of breath.   ABI's Rt=1.07 and Lt=1.11  (previous ABI's Rt=0.74 and Lt=1.01) Duplex US of the bilateral tibial arteries shows biphasic/triphasic waveforms    Review of Systems  Musculoskeletal:  Positive for back pain and gait problem.  All other systems reviewed and are negative.      Objective:   Physical Exam Vitals  reviewed.  HENT:     Head: Normocephalic.  Cardiovascular:     Rate and Rhythm: Normal rate.     Pulses: Normal pulses.  Pulmonary:     Effort: Pulmonary effort is normal.  Skin:    General: Skin is warm and dry.  Neurological:     Mental Status: She is alert and oriented to person, place, and time.  Psychiatric:        Mood and Affect: Mood normal.        Behavior: Behavior normal.        Thought Content: Thought content normal.        Judgment: Judgment normal.     BP (!) 123/93   Pulse 67   Ht '5\' 6"'$  (1.676 m)   Wt 173 lb (78.5 kg)   BMI 27.92 kg/m   Past Medical History:  Diagnosis Date   CAD (coronary artery disease)    a. PCI to proximal and distal RCA 3/26   Diabetes mellitus without complication (HCC)    GERD (gastroesophageal reflux disease)    Heart murmur    MR   Hiatal hernia    Hot flashes    Hyperlipidemia    Hypertension  Myocardial infarction ([place])    Neuropathy    Tobacco abuse    a. Started at age 32, quit during 3 pregnancies. b. 1 PPD for a 40 pack-year hx (2019)     Social History   Socioeconomic History   Marital status: Single    Spouse name: Not on file   Number of children: 3   Years of education: Not on file   Highest education level: Not on file  Occupational History   Not on file  Tobacco Use   Smoking status: Former    Packs/day: 0.25    Years: 38.00    Total pack years: 9.50    Types: Cigarettes    Quit date: 08/10/2018    Years since quitting: 4.2   Smokeless tobacco: Never  Vaping Use   Vaping Use: Never used  Substance and Sexual Activity   Alcohol use: Not Currently    Comment: occassional,none last 24hrs   Drug use: Yes    Frequency: 2.0 times per week    Types: Marijuana    Comment: smokes Marijuana 'when I cant sleep, maybe once or twice a week'   Sexual activity: Yes    Birth control/protection: Post-menopausal  Other Topics Concern   Not on file  Social History Narrative   Lives with youngest  daughter Bernestine Amass. Son lives in [place] Determinants of Health   Financial Resource Strain: Not on file  Food Insecurity: Not on file  Transportation Needs: Not on file  Physical Activity: Not on file  Stress: Not on file  Social Connections: Not on file  Intimate Partner Violence: Not At Risk (05/23/2021)   Humiliation, Afraid, Rape, and Kick questionnaire    Fear of Current or Ex-Partner: No    Emotionally Abused: No    Physically Abused: No    Sexually Abused: No    Past Surgical History:  Procedure Laterality Date   ABDOMINAL AORTIC ANEURYSM REPAIR     2019   BREAST BIOPSY Right    Multiple biopsies-all benign   BREAST SURGERY  1986   I&D for 'milk duct'   COLONOSCOPY WITH PROPOFOL N/A 07/07/2022   Procedure: COLONOSCOPY WITH PROPOFOL;  Surgeon: Jonathon Bellows, MD;  Location: [facility] ENDOSCOPY;  Service: Gastroenterology;  Laterality: N/A;   CORONARY STENT INTERVENTION N/A 01/13/2019   Procedure: CORONARY STENT INTERVENTION;  Surgeon: Nelva Bush, MD;  Location: [place] CV LAB;  Service: Cardiovascular;  Laterality: N/A;   EMBOLECTOMY  08/13/2018   Procedure: Right SFA and profunda femoris Fogarty embolectomy 3  Fogarty embolectomy balloon;  Surgeon: Algernon Huxley, MD;  Location: [facility] ORS;  Service: Vascular;;   ENDARTERECTOMY FEMORAL Right 08/13/2018   Procedure: Right common femoral, profunda femoris, and superficial femoral artery endarterectomies and patch angioplasty ;  Surgeon: Algernon Huxley, MD;  Location: [facility] ORS;  Service: Vascular;  Laterality: Right;   ENDOVASCULAR REPAIR/STENT GRAFT N/A 08/13/2018   Procedure: ENDOVASCULAR REPAIR/STENT GRAFT;  Surgeon: Katha Cabal, MD;  Location: [place] CV LAB;  Service: Cardiovascular;  Laterality: N/A;   LEFT HEART CATH AND CORONARY ANGIOGRAPHY Left 01/13/2019   Procedure: LEFT HEART CATH AND CORONARY ANGIOGRAPHY;  Surgeon: Minna Merritts, MD;  Location: [place] CV LAB;  Service: Cardiovascular;   Laterality: Left;   LOWER EXTREMITY ANGIOGRAPHY Right 09/08/2022   Procedure: Lower Extremity Angiography;  Surgeon: Algernon Huxley, MD;  Location: [place] CV LAB;  Service: Cardiovascular;  Laterality: Right;   LOWER EXTREMITY ANGIOGRAPHY Right 09/15/2022  Procedure: Lower Extremity Angiography;  Surgeon: Algernon Huxley, MD;  Location: [place] [facility] LAB;  Service: Cardiovascular;  Laterality: Right;   SALPINGOOPHORECTOMY Left 2000    Family History  Problem Relation Age of Onset   Breast cancer Mother    Diabetes Mother    Hypertension Mother    Hyperlipidemia Mother    Heart disease Mother        CABG X 4   Asthma Mother 29   Sarcoidosis Mother        In remission   Heart attack Mother    Breast cancer Maternal Grandmother 90   Diabetes Maternal Grandmother    Heart disease Maternal Grandmother    Hyperlipidemia Maternal Grandmother     Allergies  Allergen Reactions   Strawberry Extract Hives and Swelling   Strawberry Flavor Rash       Latest Ref Rng & Units 02/24/2022   11:37 AM 11/07/2021    9:14 AM 07/05/2020    8:34 AM  CBC  WBC 4.0 - 10.5 K/uL 13.4  11.5  10.5   Hemoglobin 12.0 - 15.0 g/dL 12.2  12.5  11.7   Hematocrit 36.0 - 46.0 % 37.9  38.1  36.7   Platelets 150 - 400 K/uL 469  465  469       CMP     Component Value Date/Time   NA 137 02/24/2022 1137   NA 143 01/07/2019 1153   K 3.7 02/24/2022 1137   CL 102 02/24/2022 1137   CO2 27 02/24/2022 1137   GLUCOSE 155 (H) 02/24/2022 1137   BUN 16 09/15/2022 0732   BUN 10 01/07/2019 1153   CREATININE 0.70 09/15/2022 0732   CALCIUM 9.7 02/24/2022 1137   PROT 8.5 (H) 02/24/2022 1137   ALBUMIN 4.7 02/24/2022 1137   AST 25 02/24/2022 1137   ALT 22 02/24/2022 1137   ALKPHOS 99 02/24/2022 1137   BILITOT 0.8 02/24/2022 1137   GFRNONAA >60 09/15/2022 0732   GFRAA >60 07/05/2020 0834     VAS [place] ABI WITH/WO TBI  Result Date: 10/16/2022  LOWER EXTREMITY DOPPLER STUDY Patient Name:  Skylee Veneta Penton   Date of Exam:   10/15/2022 Medical Rec #: [id number]       Accession #:    [id number] Date of Birth: 11/05/1965       Patient Gender: F Patient Age:   43 years Exam Location:  [facility] Procedure:      VAS [place] ABI WITH/WO TBI Referring Phys: [name] --------------------------------------------------------------------------------  Indications: Claudication, rest pain, and peripheral artery disease. Other Factors: Patient complains of worsening right leg pain.  Vascular Interventions: 08/13/18: EVAR with right CFA endarterectomy/PTA and                         right SFA & PFA embolectomy/endarterectomy/PTA                          09/18/2022: Catheter placement into Right Limb of the                         Aortic Stent graft analogous to the Right CIA. Aortogram                         and Selective Right Lower Extremity Angiogram.  09/15/2022: Catheter placement into Right CFA from Right                         ATA. Selective Right Lower Extremity Angiogram. PTA of                         the Right ATA with 2 mm diameter by 22 cm length                         angioplasty balloon. PTA of the Right SFA with 4 mm                         daimeter by 22 cm length Lutonix drug coated angioplasty                         balloon. Stent placement to ther Right SFA with 6 mm                         diameter by 10 cm length Life stent. Comparison Study: 09/01/2022 Performing Technologist: Almira Coaster RVS  Examination Guidelines: A complete evaluation includes at minimum, Doppler waveform signals and systolic blood pressure reading at the level of bilateral brachial, anterior tibial, and posterior tibial arteries, when vessel segments are accessible. Bilateral testing is considered an integral part of a complete examination. Photoelectric Plethysmograph (PPG) waveforms and toe systolic pressure readings are included as required and additional duplex testing as needed. Limited  examinations for reoccurring indications may be performed as noted.  ABI Findings: +---------+------------------+-----+---------+--------+ Right    Rt Pressure (mmHg)IndexWaveform Comment  +---------+------------------+-----+---------+--------+ Brachial 166                                      +---------+------------------+-----+---------+--------+ ATA      142               0.84 biphasic          +---------+------------------+-----+---------+--------+ PTA      182               1.07 triphasic         +---------+------------------+-----+---------+--------+ Great Toe136               0.80                   +---------+------------------+-----+---------+--------+ +---------+------------------+-----+---------+-------+ Left     Lt Pressure (mmHg)IndexWaveform Comment +---------+------------------+-----+---------+-------+ Brachial 170                                     +---------+------------------+-----+---------+-------+ ATA      180               1.06 triphasic        +---------+------------------+-----+---------+-------+ PTA      188               1.11 biphasic         +---------+------------------+-----+---------+-------+ Great Toe177               1.04                  +---------+------------------+-----+---------+-------+ +-------+-----------+-----------+------------+------------+ ABI/TBIToday's ABIToday's TBIPrevious ABIPrevious TBI +-------+-----------+-----------+------------+------------+  Right  1.07       .80        .74         .41          +-------+-----------+-----------+------------+------------+ Left   1.11       1.04       1.01        .94          +-------+-----------+-----------+------------+------------+ Right ABIs and TBIs appear increased compared to prior study on 09/01/2022. Left ABIs appear essentially unchanged compared to prior study on 09/01/2022. Left TBIs appear to be increased compared to prior study on 09/01/2022.   Summary: Right: Resting right ankle-brachial index is within normal range. The right toe-brachial index is normal. Left: The left toe-brachial index is normal. *See table(s) above for measurements and observations.  Electronically signed by [name] MD on 10/16/2022 at 9:57:21 AM.    Final        Assessment & Plan:   1. Peripheral arterial disease with history of revascularization ([place]) Today the patient lower extremity studies are drastically improved post angiogram.  However she continues to have significant lower extremity pain and discomfort and weakness.  Based on this I suspect that is related to her back and we will place referral as noted below.  2. Essential hypertension, benign Continue antihypertensive medications as already ordered, these medications have been reviewed and there are no changes at this time.  3. Type 2 diabetes mellitus with other circulatory complication, without long-term current use of insulin (HCC) Continue hypoglycemic medications as already ordered, these medications have been reviewed and there are no changes at this time.  Hgb A1C to be monitored as already arranged by primary service  4. Chronic midline low back pain, unspecified whether sciatica present The patient has noted weakness and discomfort with activity coming up on her feet.  It makes it difficult for her just to be active to do day-to-day activities.  Given her improvement in her noninvasive studies I suspect this is not related to her back.  We will refer the patient to neurosurgery. - Ambulatory referral to Neurosurgery   Current Outpatient Medications on File Prior to Visit  Medication Sig Dispense Refill   amLODipine (NORVASC) 10 MG tablet Take 1 tablet (10 mg total) by mouth daily. 90 tablet 3   aspirin 81 MG EC tablet Take 1 tablet (81 mg total) by mouth daily. 90 tablet 3   cloNIDine (CATAPRES) 0.2 MG tablet Take 1 tablet (0.2 mg total) by mouth in the morning and at bedtime. Take an  extra 0.1 mg for BP >160 180 tablet 3   clopidogrel (PLAVIX) 75 MG tablet Take 1 tablet (75 mg total) by mouth daily. 90 tablet 3   empagliflozin (JARDIANCE) 10 MG TABS tablet Take by mouth daily.     ezetimibe (ZETIA) 10 MG tablet Take 1 tablet (10 mg total) by mouth daily. 90 tablet 4   gabapentin (NEURONTIN) 600 MG tablet Take 600 mg by mouth 3 (three) times daily as needed.     losartan (COZAAR) 100 MG tablet Take 1 tablet by mouth once daily 30 tablet 6   metoprolol succinate (TOPROL-XL) 50 MG 24 hr tablet Take 1 tablet (50 mg total) by mouth daily. Take with or immediately following a meal. 90 tablet 3   omeprazole (PRILOSEC) 20 MG capsule Take 1 capsule (20 mg total) by mouth 2 (two) times daily before a meal. 60 capsule 1   rosuvastatin (CRESTOR) 40 MG tablet  Take 1 tablet (40 mg total) by mouth daily. 90 tablet 3   No current facility-administered medications on file prior to visit.    There are no Patient Instructions on file for this visit. No follow-ups on file.   Kris Hartmann, NP

## 2022-12-08 ENCOUNTER — Telehealth: Payer: Self-pay

## 2022-12-08 NOTE — Telephone Encounter (Signed)
Please let her know that I will work on the authorization with medicaid. We will keep her posted on the outcome.

## 2022-12-08 NOTE — Telephone Encounter (Signed)
-----   Message from Peggyann Shoals sent at 12/05/2022  1:14 PM EST ----- Regarding: MRI auth Contact: [phone] Patient is calling that she no longer has [employer] insurance since last Friday. She called to cancel the policy because she can not afford to pay the premiums. She only has her [employer]. Can you try to get auth for the scans with just medicaid? She says that she is not able to do PT because of the stint in her leg.

## 2022-12-08 NOTE — Telephone Encounter (Signed)
Patient is aware that we will notify her with the approval, she agreed.

## 2022-12-12 ENCOUNTER — Other Ambulatory Visit: Payer: Self-pay

## 2022-12-12 DIAGNOSIS — M5416 Radiculopathy, lumbar region: Secondary | ICD-10-CM

## 2022-12-12 DIAGNOSIS — G8929 Other chronic pain: Secondary | ICD-10-CM

## 2022-12-19 ENCOUNTER — Telehealth: Payer: Self-pay

## 2022-12-19 ENCOUNTER — Ambulatory Visit
Admission: RE | Admit: 2022-12-19 | Discharge: 2022-12-19 | Disposition: A | Discharge status: Home / Self Care | Payer: Medicaid Other | Source: Ambulatory Visit | Attending: Orthopedic Surgery | Admitting: Orthopedic Surgery

## 2022-12-19 DIAGNOSIS — M5441 Lumbago with sciatica, right side: Secondary | ICD-10-CM | POA: Insufficient documentation

## 2022-12-19 DIAGNOSIS — M5416 Radiculopathy, lumbar region: Secondary | ICD-10-CM | POA: Insufficient documentation

## 2022-12-19 DIAGNOSIS — R29898 Other symptoms and signs involving the musculoskeletal system: Secondary | ICD-10-CM

## 2022-12-19 DIAGNOSIS — G8929 Other chronic pain: Secondary | ICD-10-CM

## 2022-12-19 DIAGNOSIS — R2689 Other abnormalities of gait and mobility: Secondary | ICD-10-CM | POA: Diagnosis present

## 2022-12-19 NOTE — Telephone Encounter (Signed)
FYI

## 2022-12-19 NOTE — Telephone Encounter (Signed)
-----   Message from [place] sent at 12/19/2022 10:52 AM EST ----- Regarding: Unavailable for in person visits Spoke with Heather Mahoney this morning, she completed her MRI this morning (3/1) but wanted to make sure Heather Mahoney knew that, telephone visits are okay, however she will be unable to do in person visits until after 01/19/23.

## 2022-12-22 NOTE — Telephone Encounter (Signed)
She confirmed appt for 3/5 at 8am

## 2022-12-22 NOTE — Progress Notes (Unsigned)
Telephone Visit- Progress Note: Referring Physician:  No referring provider defined for this encounter.  Primary Physician:  [place]  This visit was performed via telephone.  Patient location: home Provider location: office  I spent a total of 10 minutes non-face-to-face activities for this visit on the date of this encounter including review of current clinical condition and response to treatment.    Patient has given verbal consent to this telephone visits and we reviewed the limitations of a telephone visit. Patient wishes to proceed.    Chief Complaint:  review cervical MRI results  History of Present Illness: Heather Mahoney is a 57 y.o. female has a history of CAD, GERD, heart murmur, hiatal hernia, hyperlipidemia, HTN, MI, PAD, and DM.    Had vascular procedure (stenting of right SFA) with Dr. Lucky Cowboy on 09/15/22.   Last seen by me on 11/14/22 for back and right leg pain along with intermittent numbness/tingling in her hands. She noted dexterity issues and balance issues.   Insurance recommended PT prior to approving her lumbar MRI. Phone visit today is to review her cervical MRI.   She has seen ortho for her hands and had right trigger finger release on 12/03/22 with Dr. Rudene Christians. She is getting ready to start hand OT. Still having some issues with balance and hand dexterity.   Has not started PT for her back yet. Her mom is sick and she is helping to take care of her.   Exam: No exam done as this was a telephone encounter.     Imaging: Cervical MRI dated 12/19/22:  FINDINGS: Alignment: Straightening with slight reversal of the normal cervical lordosis. Trace anterolisthesis of C7 on T1.   Vertebrae: Vertebral body height maintained without acute or chronic fracture. Bone marrow signal intensity within normal limits. No discrete or worrisome osseous lesions. Minor discogenic reactive endplate changes noted at C4-5 through C6-7. No other  abnormal marrow edema.   Cord: Normal signal and morphology.   Posterior Fossa, vertebral arteries, paraspinal tissues: Partially empty sella noted. Visualized brain and posterior fossa otherwise unremarkable. Craniocervical junction within normal limits. Paraspinous soft tissues within normal limits. Normal flow voids seen within the vertebral arteries bilaterally.   Disc levels:   C2-C3: Unremarkable.   C3-C4: Tiny central disc protrusion minimally indents the ventral thecal sac. No spinal stenosis. Foramina remain patent.   C4-C5: Degenerative intervertebral disc space narrowing. Right paracentral disc osteophyte complex indents and partially faces the ventral thecal sac. Mild flattening of the right hemi cord without cord signal changes. Mild spinal stenosis. Superimposed uncovertebral spurring with resultant mild right C5 foraminal stenosis. Left neural foramina remains patent.   C5-C6: Mild degenerative disc space narrowing with diffuse disc bulge. Associated endplate and uncovertebral spurring. Resultant mild spinal stenosis. Foramina remain patent.   C6-C7: Degenerative intervertebral disc space narrowing with diffuse disc osteophyte complex, slightly asymmetric to the right. Posterior component flattens and partially effaces the ventral thecal sac. Mild spinal stenosis without frank cord impingement. Foramina remain patent.   C7-T1: Trace anterolisthesis without significant disc bulge. No canal or foraminal stenosis.   IMPRESSION: 1. Normal MRI appearance of the cervical spinal cord. No cord signal changes to suggest myelopathy. 2. Right paracentral disc osteophyte complex at C4-5 with resultant mild canal and right C5 foraminal stenosis. 3. Disc bulging with uncovertebral spurring at C5-6 and C6-7 with resultant mild spinal stenosis.     Electronically Signed   By: Jeannine Boga M.D.   On: 12/20/2022  05:55    I have personally reviewed the images  and agree with the above interpretation.  Assessment and Plan: [name] is a pleasant 57 y.o. female that continues with dexterity and balance issues. She had right trigger finger release on 12/03/22 with Dr. Rudene Christians.  Cervical MRI shows mild central stenosis C4-C7 with cervical spondylosis. I do not think this is causing her dexterity or balance issues.   Treatment options discussed with patient and following plan made:   - Referral to neurology at [facility] to evaluate balance issues.  - Order for physical therapy for lumbar spine sent to [facility] on [address].  - Will message her in 4 weeks to check on her progress. If she is discharged from PT for her back, then she will let me know so I can reorder her MRI.   Geronimo Boot PA-C Neurosurgery

## 2022-12-22 NOTE — Telephone Encounter (Signed)
Please schedule phone visit with me to review her cervical MRI results.

## 2022-12-23 ENCOUNTER — Encounter: Payer: Self-pay | Admitting: Orthopedic Surgery

## 2022-12-23 ENCOUNTER — Ambulatory Visit (INDEPENDENT_AMBULATORY_CARE_PROVIDER_SITE_OTHER): Payer: Medicaid Other | Admitting: Orthopedic Surgery

## 2022-12-23 DIAGNOSIS — M5416 Radiculopathy, lumbar region: Secondary | ICD-10-CM | POA: Diagnosis not present

## 2022-12-23 DIAGNOSIS — G8929 Other chronic pain: Secondary | ICD-10-CM

## 2022-12-23 DIAGNOSIS — R2689 Other abnormalities of gait and mobility: Secondary | ICD-10-CM

## 2022-12-23 DIAGNOSIS — M47812 Spondylosis without myelopathy or radiculopathy, cervical region: Secondary | ICD-10-CM | POA: Diagnosis not present

## 2022-12-23 DIAGNOSIS — M5441 Lumbago with sciatica, right side: Secondary | ICD-10-CM

## 2023-01-20 ENCOUNTER — Other Ambulatory Visit (INDEPENDENT_AMBULATORY_CARE_PROVIDER_SITE_OTHER): Payer: Self-pay | Admitting: Nurse Practitioner

## 2023-01-20 ENCOUNTER — Encounter (INDEPENDENT_AMBULATORY_CARE_PROVIDER_SITE_OTHER): Payer: Self-pay | Admitting: Nurse Practitioner

## 2023-01-20 DIAGNOSIS — I739 Peripheral vascular disease, unspecified: Secondary | ICD-10-CM

## 2023-02-02 ENCOUNTER — Ambulatory Visit (INDEPENDENT_AMBULATORY_CARE_PROVIDER_SITE_OTHER): Payer: Medicaid Other

## 2023-02-02 ENCOUNTER — Ambulatory Visit (INDEPENDENT_AMBULATORY_CARE_PROVIDER_SITE_OTHER): Payer: Medicaid Other | Admitting: Nurse Practitioner

## 2023-02-02 ENCOUNTER — Encounter (INDEPENDENT_AMBULATORY_CARE_PROVIDER_SITE_OTHER): Payer: Self-pay | Admitting: Nurse Practitioner

## 2023-02-02 VITALS — BP 183/124 | HR 72 | Resp 16 | Wt 168.6 lb

## 2023-02-02 DIAGNOSIS — M545 Low back pain, unspecified: Secondary | ICD-10-CM | POA: Diagnosis not present

## 2023-02-02 DIAGNOSIS — Z9889 Other specified postprocedural states: Secondary | ICD-10-CM | POA: Diagnosis not present

## 2023-02-02 DIAGNOSIS — G8929 Other chronic pain: Secondary | ICD-10-CM

## 2023-02-02 DIAGNOSIS — E1159 Type 2 diabetes mellitus with other circulatory complications: Secondary | ICD-10-CM

## 2023-02-02 DIAGNOSIS — I10 Essential (primary) hypertension: Secondary | ICD-10-CM

## 2023-02-02 DIAGNOSIS — I739 Peripheral vascular disease, unspecified: Secondary | ICD-10-CM | POA: Diagnosis not present

## 2023-02-03 NOTE — Progress Notes (Signed)
Subjective:    Patient ID: Heather Mahoney, female    DOB: 1966/04/09, 57 y.o.   MRN: [id number] Chief Complaint  Patient presents with   Follow-up    Ultrasound follow up    The patient returns to the office for followup and review status post angiogram with intervention on 09/15/2022.   Procedure: Procedure(s) Performed:             1.  Ultrasound guidance for vascular access right anterior tibial artery             2.  Catheter placement into right common femoral artery from right anterior tibial artery             3.  Selective right lower extremity angiogram             4.  Percutaneous transluminal angioplasty of right anterior tibial artery with 2 mm diameter by 22 cm length angioplasty balloon             5.  Percutaneous transluminal angioplasty of right SFA with 4 mm diameter by 22 cm length Lutonix drug-coated angioplasty balloon             6.  Stent placement to the right SFA with 6 mm diameter by 10 cm length life stent             7.  TR band placement right anterior tibial artery   The patient notes improvement in the lower extremity symptoms, however she still has significant weakness in her lower extremities.. No interval shortening of the patient's claudication distance or rest pain symptoms. No new ulcers or wounds have occurred since the last visit.  She continues to have some pain in her lower legs but she is working with neurosurgery and has upcoming physical therapy.  She will be following up with them for lower back MRIs following completion of physical therapy.  There have been no significant changes to the patient's overall health care.  No documented history of amaurosis fugax or recent TIA symptoms. There are no recent neurological changes noted. No documented history of DVT, PE or superficial thrombophlebitis. The patient denies recent episodes of angina or shortness of breath.   ABI's Rt=1.04 and Lt=1.09  (previous ABI's Rt=1.07 and Lt=1.11) Duplex US of  the right tibial vessels with monophasic waveforms and biphasic on the left.    Review of Systems  Musculoskeletal:  Positive for back pain and gait problem.  All other systems reviewed and are negative.      Objective:   Physical Exam Vitals reviewed.  HENT:     Head: Normocephalic.  Cardiovascular:     Rate and Rhythm: Normal rate.     Pulses: Normal pulses.  Pulmonary:     Effort: Pulmonary effort is normal.  Skin:    General: Skin is warm and dry.  Neurological:     Mental Status: She is alert and oriented to person, place, and time.  Psychiatric:        Mood and Affect: Mood normal.        Behavior: Behavior normal.        Thought Content: Thought content normal.        Judgment: Judgment normal.     BP (!) 183/124 (BP Location: Right Arm)   Pulse 72   Resp 16   Wt 168 lb 9.6 oz (76.5 kg)   BMI 27.21 kg/m   Past Medical History:  Diagnosis Date   CAD (coronary  artery disease)    a. PCI to proximal and distal RCA 3/26   Diabetes mellitus without complication    GERD (gastroesophageal reflux disease)    Heart murmur    MR   Hiatal hernia    Hot flashes    Hyperlipidemia    Hypertension    Myocardial infarction    Neuropathy    Tobacco abuse    a. Started at age 68, quit during 3 pregnancies. b. 1 PPD for a 40 pack-year hx (2019)     Social History   Socioeconomic History   Marital status: Single    Spouse name: Not on file   Number of children: 3   Years of education: Not on file   Highest education level: Not on file  Occupational History   Not on file  Tobacco Use   Smoking status: Former    Packs/day: 0.25    Years: 38.00    Additional pack years: 0.00    Total pack years: 9.50    Types: Cigarettes    Quit date: 08/10/2018    Years since quitting: 4.4   Smokeless tobacco: Never  Vaping Use   Vaping Use: Never used  Substance and Sexual Activity   Alcohol use: Not Currently    Comment: occassional,none last 24hrs   Drug use: Yes     Frequency: 2.0 times per week    Types: Marijuana    Comment: smokes Marijuana 'when I cant sleep, maybe once or twice a week'   Sexual activity: Yes    Birth control/protection: Post-menopausal  Other Topics Concern   Not on file  Social History Narrative   Lives with youngest daughter Joslyn Devon. Son lives in [place]   Social Determinants of Health   Financial Resource Strain: Not on file  Food Insecurity: Not on file  Transportation Needs: Not on file  Physical Activity: Not on file  Stress: Not on file  Social Connections: Not on file  Intimate Partner Violence: Not At Risk (05/23/2021)   Humiliation, Afraid, Rape, and Kick questionnaire    Fear of Current or Ex-Partner: No    Emotionally Abused: No    Physically Abused: No    Sexually Abused: No    Past Surgical History:  Procedure Laterality Date   ABDOMINAL AORTIC ANEURYSM REPAIR     2019   BREAST BIOPSY Right    Multiple biopsies-all benign   BREAST SURGERY  1986   I&D for 'milk duct'   COLONOSCOPY WITH PROPOFOL N/A 07/07/2022   Procedure: COLONOSCOPY WITH PROPOFOL;  Surgeon: Wyline Mood, MD;  Location: [facility] ENDOSCOPY;  Service: Gastroenterology;  Laterality: N/A;   CORONARY STENT INTERVENTION N/A 01/13/2019   Procedure: CORONARY STENT INTERVENTION;  Surgeon: Yvonne Kendall, MD;  Location: [facility] INVASIVE CV LAB;  Service: Cardiovascular;  Laterality: N/A;   EMBOLECTOMY  08/13/2018   Procedure: Right SFA and profunda femoris Fogarty embolectomy 3  Fogarty embolectomy balloon;  Surgeon: Annice Needy, MD;  Location: [facility] ORS;  Service: Vascular;;   ENDARTERECTOMY FEMORAL Right 08/13/2018   Procedure: Right common femoral, profunda femoris, and superficial femoral artery endarterectomies and patch angioplasty ;  Surgeon: Annice Needy, MD;  Location: [facility] ORS;  Service: Vascular;  Laterality: Right;   ENDOVASCULAR REPAIR/STENT GRAFT N/A 08/13/2018   Procedure: ENDOVASCULAR REPAIR/STENT GRAFT;  Surgeon: Renford Dills, MD;   Location: [facility] INVASIVE CV LAB;  Service: Cardiovascular;  Laterality: N/A;   LEFT HEART CATH AND CORONARY ANGIOGRAPHY Left 01/13/2019   Procedure: LEFT HEART CATH  AND CORONARY ANGIOGRAPHY;  Surgeon: Antonieta Iba, MD;  Location: [facility] INVASIVE CV LAB;  Service: Cardiovascular;  Laterality: Left;   LOWER EXTREMITY ANGIOGRAPHY Right 09/08/2022   Procedure: Lower Extremity Angiography;  Surgeon: Annice Needy, MD;  Location: [facility] INVASIVE CV LAB;  Service: Cardiovascular;  Laterality: Right;   LOWER EXTREMITY ANGIOGRAPHY Right 09/15/2022   Procedure: Lower Extremity Angiography;  Surgeon: Annice Needy, MD;  Location: [facility] INVASIVE CV LAB;  Service: Cardiovascular;  Laterality: Right;   SALPINGOOPHORECTOMY Left 2000    Family History  Problem Relation Age of Onset   Breast cancer Mother    Diabetes Mother    Hypertension Mother    Hyperlipidemia Mother    Heart disease Mother        CABG X 4   Asthma Mother 62   Sarcoidosis Mother        In remission   Heart attack Mother    Breast cancer Maternal Grandmother 60   Diabetes Maternal Grandmother    Heart disease Maternal Grandmother    Hyperlipidemia Maternal Grandmother     Allergies  Allergen Reactions   Strawberry Extract Hives and Swelling   Strawberry Flavor Rash       Latest Ref Rng & Units 02/24/2022   11:37 AM 11/07/2021    9:14 AM 07/05/2020    8:34 AM  CBC  WBC 4.0 - 10.5 K/uL 13.4  11.5  10.5   Hemoglobin 12.0 - 15.0 g/dL 57.8  46.9  62.9   Hematocrit 36.0 - 46.0 % 37.9  38.1  36.7   Platelets 150 - 400 K/uL 469  465  469       CMP     Component Value Date/Time   NA 137 02/24/2022 1137   NA 143 01/07/2019 1153   K 3.7 02/24/2022 1137   CL 102 02/24/2022 1137   CO2 27 02/24/2022 1137   GLUCOSE 155 (H) 02/24/2022 1137   BUN 16 09/15/2022 0732   BUN 10 01/07/2019 1153   CREATININE 0.70 09/15/2022 0732   CALCIUM 9.7 02/24/2022 1137   PROT 8.5 (H) 02/24/2022 1137   ALBUMIN 4.7 02/24/2022 1137   AST 25  02/24/2022 1137   ALT 22 02/24/2022 1137   ALKPHOS 99 02/24/2022 1137   BILITOT 0.8 02/24/2022 1137   GFRNONAA >60 09/15/2022 0732   GFRAA >60 07/05/2020 0834     VAS [place] ABI WITH/WO TBI  Result Date: 10/16/2022  LOWER EXTREMITY DOPPLER STUDY Patient Name:  Donette Dorien Chihuahua  Date of Exam:   10/15/2022 Medical Rec #: [id number]       Accession #:    [id number] Date of Birth: [birth year]       Patient Gender: F Patient Age:   44 years Exam Location:  [facility] Procedure:      VAS [place] ABI WITH/WO TBI Referring Phys: Festus Barren --------------------------------------------------------------------------------  Indications: Claudication, rest pain, and peripheral artery disease. Other Factors: Patient complains of worsening right leg pain.  Vascular Interventions: 08/13/18: EVAR with right CFA endarterectomy/PTA and                         right SFA & PFA embolectomy/endarterectomy/PTA                          09/18/2022: Catheter placement into Right Limb of the  Aortic Stent graft analogous to the Right CIA. Aortogram                         and Selective Right Lower Extremity Angiogram.                          09/15/2022: Catheter placement into Right CFA from Right                         ATA. Selective Right Lower Extremity Angiogram. PTA of                         the Right ATA with 2 mm diameter by 22 cm length                         angioplasty balloon. PTA of the Right SFA with 4 mm                         daimeter by 22 cm length Lutonix drug coated angioplasty                         balloon. Stent placement to ther Right SFA with 6 mm                         diameter by 10 cm length Life stent. Comparison Study: 09/01/2022 Performing Technologist: Debbe Bales RVS  Examination Guidelines: A complete evaluation includes at minimum, Doppler waveform signals and systolic blood pressure reading at the level of bilateral brachial, anterior tibial, and posterior tibial  arteries, when vessel segments are accessible. Bilateral testing is considered an integral part of a complete examination. Photoelectric Plethysmograph (PPG) waveforms and toe systolic pressure readings are included as required and additional duplex testing as needed. Limited examinations for reoccurring indications may be performed as noted.  ABI Findings: +---------+------------------+-----+---------+--------+ Right    Rt Pressure (mmHg)IndexWaveform Comment  +---------+------------------+-----+---------+--------+ Brachial 166                                      +---------+------------------+-----+---------+--------+ ATA      142               0.84 biphasic          +---------+------------------+-----+---------+--------+ PTA      182               1.07 triphasic         +---------+------------------+-----+---------+--------+ Great Toe136               0.80                   +---------+------------------+-----+---------+--------+ +---------+------------------+-----+---------+-------+ Left     Lt Pressure (mmHg)IndexWaveform Comment +---------+------------------+-----+---------+-------+ Brachial 170                                     +---------+------------------+-----+---------+-------+ ATA      180               1.06 triphasic        +---------+------------------+-----+---------+-------+ PTA      188  1.11 biphasic         +---------+------------------+-----+---------+-------+ Great Toe177               1.04                  +---------+------------------+-----+---------+-------+ +-------+-----------+-----------+------------+------------+ ABI/TBIToday's ABIToday's TBIPrevious ABIPrevious TBI +-------+-----------+-----------+------------+------------+ Right  1.07       .80        .74         .41          +-------+-----------+-----------+------------+------------+ Left   1.11       1.04       1.01        .94           +-------+-----------+-----------+------------+------------+ Right ABIs and TBIs appear increased compared to prior study on 09/01/2022. Left ABIs appear essentially unchanged compared to prior study on 09/01/2022. Left TBIs appear to be increased compared to prior study on 09/01/2022.  Summary: Right: Resting right ankle-brachial index is within normal range. The right toe-brachial index is normal. Left: The left toe-brachial index is normal. *See table(s) above for measurements and observations.  Electronically signed by Festus Barren MD on 10/16/2022 at 9:57:21 AM.    Final        Assessment & Plan:   1. Peripheral arterial disease with history of revascularization (HCC)  Recommend:  The patient has evidence of atherosclerosis of the lower extremities with claudication.  The patient does not voice lifestyle limiting changes at this point in time.  Noninvasive studies do not suggest clinically significant change.  No invasive studies, angiography or surgery at this time The patient should continue walking and begin a more formal exercise program.  The patient should continue antiplatelet therapy and aggressive treatment of the lipid abnormalities  No changes in the patient's medications at this time  Continued surveillance is indicated as atherosclerosis is likely to progress with time.    The patient will continue follow up with noninvasive studies as ordered.   2. Essential hypertension, benign Continue antihypertensive medications as already ordered, these medications have been reviewed and there are no changes at this time.  3. Type 2 diabetes mellitus with other circulatory complication, without long-term current use of insulin (HCC) Continue hypoglycemic medications as already ordered, these medications have been reviewed and there are no changes at this time.  Hgb A1C to be monitored as already arranged by primary service  4. Chronic midline low back pain, unspecified whether  sciatica present Patient has been evaluated by neurosurgery will continue with them and their plan of care.   Current Outpatient Medications on File Prior to Visit  Medication Sig Dispense Refill   amLODipine (NORVASC) 10 MG tablet Take 1 tablet (10 mg total) by mouth daily. 90 tablet 3   aspirin 81 MG EC tablet Take 1 tablet (81 mg total) by mouth daily. 90 tablet 3   cloNIDine (CATAPRES) 0.2 MG tablet Take 1 tablet (0.2 mg total) by mouth in the morning and at bedtime. Take an extra 0.1 mg for BP >160 180 tablet 3   clopidogrel (PLAVIX) 75 MG tablet Take 1 tablet (75 mg total) by mouth daily. 90 tablet 3   ezetimibe (ZETIA) 10 MG tablet Take 1 tablet (10 mg total) by mouth daily. 90 tablet 4   gabapentin (NEURONTIN) 600 MG tablet Take 600 mg by mouth 3 (three) times daily as needed.     losartan (COZAAR) 100 MG tablet Take 1 tablet by mouth once daily 30 tablet  6   metoprolol succinate (TOPROL-XL) 50 MG 24 hr tablet Take 1 tablet (50 mg total) by mouth daily. Take with or immediately following a meal. 90 tablet 3   omeprazole (PRILOSEC) 20 MG capsule Take 1 capsule (20 mg total) by mouth 2 (two) times daily before a meal. 60 capsule 1   rosuvastatin (CRESTOR) 40 MG tablet Take 1 tablet (40 mg total) by mouth daily. 90 tablet 3   venlafaxine (EFFEXOR) 75 MG tablet Take 75 mg by mouth 2 (two) times daily with a meal.     empagliflozin (JARDIANCE) 10 MG TABS tablet Take by mouth daily.     No current facility-administered medications on file prior to visit.    There are no Patient Instructions on file for this visit. No follow-ups on file.   Georgiana Spinner, NP

## 2023-02-04 LAB — VAS US ABI WITH/WO TBI
Left ABI: 1.09
Right ABI: 1.04

## 2023-02-09 LAB — HM DIABETES EYE EXAM

## 2023-02-19 ENCOUNTER — Encounter: Payer: Self-pay | Admitting: Emergency Medicine

## 2023-02-19 ENCOUNTER — Emergency Department
Admission: EM | Admit: 2023-02-19 | Discharge: 2023-02-19 | Disposition: A | Discharge status: Home / Self Care | Payer: Medicaid Other | Attending: Emergency Medicine | Admitting: Emergency Medicine

## 2023-02-19 ENCOUNTER — Emergency Department: Payer: Medicaid Other

## 2023-02-19 DIAGNOSIS — M79604 Pain in right leg: Secondary | ICD-10-CM

## 2023-02-19 NOTE — ED Triage Notes (Signed)
Pt presents ambulatory to triage via POV with complaints of R leg swelling with associated pain for the last several days. Pt notes being on Plavix for hx stent placement in November 2023 for poor circulation. Pt endorses pain with ambulation. A&Ox4 at this time. Denies CP or SOB.

## 2023-02-19 NOTE — ED Provider Notes (Signed)
[facility] Provider Note  Patient Contact: 9:57 PM (approximate)   History   Leg Swelling   HPI  Heather Mahoney is a 57 y.o. female who presents emergency complaining of leg pain.  Patient has had some ongoing appears exercise-induced leg pain.  Patient has had vascular stents placed, is followed by vascular surgery who.  Patient states that when she does activity, she will have increased pain.  There is no edema, erythema.  No fevers or chills.  No injury to the right lower extremity.     Physical Exam   Triage Vital Signs: ED Triage Vitals  Enc Vitals Group     BP 02/19/23 2024 (!) 173/107     Pulse Rate 02/19/23 2024 70     Resp 02/19/23 2024 18     Temp 02/19/23 2024 98.3 F (36.8 C)     Temp Source 02/19/23 2024 Oral     SpO2 02/19/23 2024 100 %     Weight 02/19/23 2020 168 lb 3.2 oz (76.3 kg)     Height 02/19/23 2020 5\' 6"  (1.676 m)     Head Circumference --      Peak Flow --      Pain Score 02/19/23 2020 8     Pain Loc --      Pain Edu? --      Excl. in GC? --     Most recent vital signs: Vitals:   02/19/23 2024  BP: (!) 173/107  Pulse: 70  Resp: 18  Temp: 98.3 F (36.8 C)  SpO2: 100%     General: Alert and in no acute distress.   Cardiovascular:  Good peripheral perfusion Respiratory: Normal respiratory effort without tachypnea or retractions. Lungs CTAB.  Musculoskeletal: Full range of motion to all extremities.  Visualization of the lower extremity reveals no edema, erythema, signs of trauma, ecchymosis.  Pulses intact.  Sensation intact Neurologic:  No gross focal neurologic deficits are appreciated.  Skin:   No rash noted Other:   ED Results / Procedures / Treatments   Labs (all labs ordered are listed, but only abnormal results are displayed) Labs Reviewed - No data to display   EKG     RADIOLOGY  I personally viewed, evaluated, and interpreted these images as part of my medical decision making, as well  as reviewing the written report by the radiologist.  ED Provider Interpretation: No evidence of DVT  US Venous Img Lower Unilateral Right  Result Date: 02/19/2023 CLINICAL DATA:  Pain and swelling EXAM: Right LOWER EXTREMITY VENOUS DOPPLER ULTRASOUND TECHNIQUE: Gray-scale sonography with compression, as well as color and duplex ultrasound, were performed to evaluate the deep venous system(s) from the level of the common femoral vein through the popliteal and proximal calf veins. COMPARISON:  None Available. FINDINGS: VENOUS Normal compressibility of the common femoral, superficial femoral, and popliteal veins, as well as the visualized calf veins. Visualized portions of profunda femoral vein and great saphenous vein unremarkable. No filling defects to suggest DVT on grayscale or color Doppler imaging. Doppler waveforms show normal direction of venous flow, normal respiratory plasticity and response to augmentation. Limited views of the contralateral common femoral vein are unremarkable. OTHER None. Limitations: none IMPRESSION: There is no evidence of deep venous thrombosis in right lower extremity. Electronically Signed   By: Ernie Avena M.D.   On: 02/19/2023 21:24    PROCEDURES:  Critical Care performed: No  Procedures   MEDICATIONS ORDERED IN ED: Medications - No  data to display   IMPRESSION / MDM / ASSESSMENT AND PLAN / ED COURSE  I reviewed the triage vital signs and the nursing notes.                                 Differential diagnosis includes, but is not limited to, DVT, limb ischemia, sciatica, cellulitis  Patient's presentation is most consistent with acute presentation with potential threat to life or bodily function.   Patient's diagnosis is consistent with leg pain.  Patient presents emergency department with ongoing leg pain.  Patient had a stent placed with vascular surgery.  Has had some ongoing pain with exercise/use.  Patient has had no increase in pain,  edema, erythema but wanted to ensure she did not have a DVT.  She is currently on anticoagulation.  Pulses were intact and not concern for ischemia.  Ultrasound reveals no evidence of DVT.  Patient should follow-up with her specialists at this time.  Patient agreeable with this plan..  Return precautions discussed with the patient.  Patient is given ED precautions to return to the ED for any worsening or new symptoms.     FINAL CLINICAL IMPRESSION(S) / ED DIAGNOSES   Final diagnoses:  Right leg pain     Rx / DC Orders   ED Discharge Orders     None        Note:  This document was prepared using Dragon voice recognition software and may include unintentional dictation errors.   Lanette Hampshire 02/19/23 2200    Jene Every, MD 02/24/23 [phone]

## 2023-02-20 ENCOUNTER — Ambulatory Visit: Payer: Medicaid Other | Admitting: Physician Assistant

## 2023-03-03 ENCOUNTER — Encounter: Payer: Self-pay | Admitting: Occupational Therapy

## 2023-03-03 ENCOUNTER — Ambulatory Visit: Payer: Medicaid Other | Admitting: Occupational Therapy

## 2023-03-03 ENCOUNTER — Ambulatory Visit: Payer: Medicaid Other | Attending: Orthopedic Surgery

## 2023-03-03 DIAGNOSIS — M25641 Stiffness of right hand, not elsewhere classified: Secondary | ICD-10-CM | POA: Insufficient documentation

## 2023-03-03 DIAGNOSIS — M79641 Pain in right hand: Secondary | ICD-10-CM

## 2023-03-03 DIAGNOSIS — M545 Low back pain, unspecified: Secondary | ICD-10-CM | POA: Diagnosis present

## 2023-03-03 DIAGNOSIS — R600 Localized edema: Secondary | ICD-10-CM

## 2023-03-03 DIAGNOSIS — M5416 Radiculopathy, lumbar region: Secondary | ICD-10-CM | POA: Insufficient documentation

## 2023-03-03 DIAGNOSIS — M79604 Pain in right leg: Secondary | ICD-10-CM | POA: Insufficient documentation

## 2023-03-03 DIAGNOSIS — M6281 Muscle weakness (generalized): Secondary | ICD-10-CM | POA: Diagnosis present

## 2023-03-03 DIAGNOSIS — M5441 Lumbago with sciatica, right side: Secondary | ICD-10-CM | POA: Diagnosis not present

## 2023-03-03 DIAGNOSIS — G8929 Other chronic pain: Secondary | ICD-10-CM | POA: Insufficient documentation

## 2023-03-03 DIAGNOSIS — R262 Difficulty in walking, not elsewhere classified: Secondary | ICD-10-CM | POA: Diagnosis present

## 2023-03-03 NOTE — Therapy (Signed)
[facility] [address] [place], [address] Phone: [phone]   Fax:  [phone]  Occupational Therapy Evaluation  Patient Details  Name: Heather Mahoney MRN: [id number] Date of Birth: 1965-12-26 Referring Provider (OT): [place]   Encounter Date: 03/03/2023   OT End of Session - 03/03/23 1250     Visit Number 1    Number of Visits 6    Date for OT Re-Evaluation 04/14/23    OT Start Time 0910    OT Stop Time 0951    OT Time Calculation (min) 41 min    Activity Tolerance Patient tolerated treatment well    Behavior During Therapy [facility] for tasks assessed/performed             Past Medical History:  Diagnosis Date   CAD (coronary artery disease)    a. PCI to proximal and distal RCA 3/26   Diabetes mellitus without complication (HCC)    GERD (gastroesophageal reflux disease)    Heart murmur    MR   Hiatal hernia    Hot flashes    Hyperlipidemia    Hypertension    Myocardial infarction (HCC)    Neuropathy    Tobacco abuse    a. Started at age 27, quit during 3 pregnancies. b. 1 PPD for a 40 pack-year hx (2019)     Past Surgical History:  Procedure Laterality Date   ABDOMINAL AORTIC ANEURYSM REPAIR     2019   BREAST BIOPSY Right    Multiple biopsies-all benign   BREAST SURGERY  1986   I&D for 'milk duct'   COLONOSCOPY WITH PROPOFOL N/A 07/07/2022   Procedure: COLONOSCOPY WITH PROPOFOL;  Surgeon: Wyline Mood, MD;  Location: [facility] ENDOSCOPY;  Service: Gastroenterology;  Laterality: N/A;   CORONARY STENT INTERVENTION N/A 01/13/2019   Procedure: CORONARY STENT INTERVENTION;  Surgeon: Yvonne Kendall, MD;  Location: [facility] INVASIVE CV LAB;  Service: Cardiovascular;  Laterality: N/A;   EMBOLECTOMY  08/13/2018   Procedure: Right SFA and profunda femoris Fogarty embolectomy 3  Fogarty embolectomy balloon;  Surgeon: Annice Needy, MD;  Location: [facility] ORS;  Service: Vascular;;   ENDARTERECTOMY FEMORAL Right  08/13/2018   Procedure: Right common femoral, profunda femoris, and superficial femoral artery endarterectomies and patch angioplasty ;  Surgeon: Annice Needy, MD;  Location: [facility] ORS;  Service: Vascular;  Laterality: Right;   ENDOVASCULAR REPAIR/STENT GRAFT N/A 08/13/2018   Procedure: ENDOVASCULAR REPAIR/STENT GRAFT;  Surgeon: Renford Dills, MD;  Location: [facility] INVASIVE CV LAB;  Service: Cardiovascular;  Laterality: N/A;   LEFT HEART CATH AND CORONARY ANGIOGRAPHY Left 01/13/2019   Procedure: LEFT HEART CATH AND CORONARY ANGIOGRAPHY;  Surgeon: Antonieta Iba, MD;  Location: [facility] INVASIVE CV LAB;  Service: Cardiovascular;  Laterality: Left;   LOWER EXTREMITY ANGIOGRAPHY Right 09/08/2022   Procedure: Lower Extremity Angiography;  Surgeon: Annice Needy, MD;  Location: [facility] INVASIVE CV LAB;  Service: Cardiovascular;  Laterality: Right;   LOWER EXTREMITY ANGIOGRAPHY Right 09/15/2022   Procedure: Lower Extremity Angiography;  Surgeon: Annice Needy, MD;  Location: [facility] INVASIVE CV LAB;  Service: Cardiovascular;  Laterality: Right;   SALPINGOOPHORECTOMY Left 2000    There were no vitals filed for this visit.   Subjective Assessment - 03/03/23 1244     Subjective  I cannot bend or make a fist with the middle finger.  And then pain in my finger as well as my palm..  And right-hand-dominant.    Pertinent  History Patient had 12/03/2022 right third digit trigger finger release done by Dr. Rosita Kea.  Patient reports she had this for 10 years prior.  Patient referred to OT for continued stiffness and pain in right third digit.    Patient Stated Goals I want to bend the middle finger and be able to make a fist to use it at home with cooking and sewing and playing with her 58-year-old grandchild    Currently in Pain? Yes    Pain Score 4     Pain Location Finger (Comment which one)    Pain Orientation Right    Pain Descriptors / Indicators Aching;Tightness;Sore;Tender    Pain Type Surgical pain    Pain  Onset More than a month ago    Pain Frequency Intermittent               [facility] OT Assessment - 03/03/23 0001       Assessment   Medical Diagnosis R 3rd digit trigger finger release    Referring Provider (OT) Floyce Stakes PA    Onset Date/Surgical Date 12/03/22    Hand Dominance Right      Home  Environment   Lives With Family      Prior Function   Vocation --   Apply for disability   Leisure Do some sewing, cooking and cleaning and PEG set for 34-year-old grandchild is-patient autism      Strength   Right Hand Grip (lbs) 44    Right Hand Lateral Pinch 14 lbs    Right Hand 3 Point Pinch 7 lbs    Left Hand Grip (lbs) 36    Left Hand Lateral Pinch 13 lbs    Left Hand 3 Point Pinch 9 lbs      Right Hand AROM   R Index  MCP 0-90 90 Degrees    R Index PIP 0-100 90 Degrees    R Long  MCP 0-90 90 Degrees    R Long PIP 0-100 75 Degrees   -10 ext   R Long DIP 0-70 40 Degrees    R Ring  MCP 0-90 80 Degrees    R Ring PIP 0-100 100 Degrees    R Little  MCP 0-90 80 Degrees    R Little PIP 0-100 100 Degrees                      OT Treatments/Exercises (OP) - 03/03/23 0001       RUE Fluidotherapy   Number Minutes Fluidotherapy 8 Minutes    RUE Fluidotherapy Location Hand    Comments AROM in all planes prior to HEP review             To 3 times a day patient to do contrast prior to soft tissue massage using red roller for extension 20 reps pain-free Followed by tendon glides for MC flexion PIP extension 10 reps Passive range of motion for intrinsic assist, followed by active range of motion 10 reps each Followed by composite fist passive range of motion in a pain-free range 10 reps Patient to enlarge grips in the meantime to decrease pain and palm Order to use larger joints like palms of forearm   Patient to wear compression silicone sleeve on third digit at nighttime to decrease edema and PIP    OT Education - 03/03/23 1249     Education Details Findings  to evaluation program    Person(s) Educated Patient    Methods Explanation;Demonstration;Tactile cues;Verbal cues  Comprehension Verbal cues required;Returned demonstration;Verbalized understanding                 OT Long Term Goals - 03/03/23 1254       OT LONG TERM GOAL #1   Title Patient be independent in home program to decrease edema and pain and increase range of motion of third digit to touch palm without increased symptoms    Baseline Edema in the third digit as well as pain 4/10 with active range of motion and on the dorsal 3rd digit as well as palm    Time 3    Period Weeks    Status New    Target Date 03/24/23      OT LONG TERM GOAL #2   Title Third digit active range of motion increased to within normal limits for patient to hold knife, toothbrush and broom without increase symptoms    Baseline PIP flexion 75 degrees and DIP 40.  Pain with flexion 4/10    Time 4    Period Weeks    Status New    Target Date 03/31/23      OT LONG TERM GOAL #3   Title Right grip and prehension strength increased to within normal limits for her age without increase symptoms for patient to hold broom this left pots and pain and apply with child    Baseline Grip 44 and lateral pinch 14.  3-point pinch 7 pounds on the right with discomfort, left 9 pounds    Time 6    Period Weeks    Status New    Target Date 04/14/23                   Plan - 03/03/23 1250     Clinical Impression Statement Patient presented OT evaluation with a diagnosis of right third digit trigger finger release on 11/25/2022 by Dr. Rosita Kea.  Patient report she had some for about 10 years.  Patient do have arthritis with stiffness in the hands as well as history of being diabetic.  Patient scar tissue doing very well.  Patient with decreased PIP extension of the third -10, action 75 at PIP and DIP 40.  Patient with stiffness and pain in the third digit with intrinsic a fist and composite flexion.  Patient with  increased pain with decreased use of right dominant hand in ADLs and IADLs.  Patient can benefit from OT services to decrease pain and edema and wrist motion and length for patient to return to prior level of function.    OT Occupational Profile and History Problem Focused Assessment - Including review of records relating to presenting problem    Occupational performance deficits (Please refer to evaluation for details): ADL's;IADL's;Play;Social Participation;Leisure    Body Structure / Function / Physical Skills ADL;Decreased knowledge of use of DME;Flexibility;ROM;UE functional use;Dexterity;Edema;Pain;Strength;IADL;Sensation    Rehab Potential Fair    Clinical Decision Making Limited treatment options, no task modification necessary    Comorbidities Affecting Occupational Performance: May have comorbidities impacting occupational performance   arhritis in DIP's and Diabetic   Modification or Assistance to Complete Evaluation  No modification of tasks or assist necessary to complete eval    OT Frequency 1x / week    OT Duration 6 weeks    OT Treatment/Interventions Self-care/ADL training;Ultrasound;Moist Heat;Paraffin;Fluidtherapy;Contrast Bath;DME and/or AE instruction;Manual Therapy;Passive range of motion;Scar mobilization;Splinting;Patient/family education;Therapeutic exercise    Consulted and Agree with Plan of Care Patient  Patient will benefit from skilled therapeutic intervention in order to improve the following deficits and impairments:   Body Structure / Function / Physical Skills: ADL, Decreased knowledge of use of DME, Flexibility, ROM, UE functional use, Dexterity, Edema, Pain, Strength, IADL, Sensation       Visit Diagnosis: Stiffness of right hand, not elsewhere classified  Localized edema  Pain in right hand    Problem List Patient Active Problem List   Diagnosis Date Noted   Encounter for screening colonoscopy    Adenomatous polyp of colon     Acute gastroenteritis 04/26/2020   History of endovascular stent graft for abdominal aortic aneurysm 04/26/2020   Urticaria 04/26/2020   Diabetes (HCC) 07/05/2019   Precordial chest pain    Chest pain 04/09/2019   Atherosclerosis of native arteries of extremity with intermittent claudication (HCC) 04/01/2019   Coronary artery disease of native artery of native heart with stable angina pectoris (HCC) 01/26/2019   CAD (coronary artery disease) 01/14/2019   Unstable angina (HCC) 01/13/2019   PAD (peripheral artery disease) (HCC) 08/30/2018   AAA (abdominal aortic aneurysm) (HCC) 08/10/2018   Chest pain with moderate risk for cardiac etiology 12/18/2017   Scotoma 12/18/2017   PVC (premature ventricular contraction) 02/12/2016   Essential hypertension, benign 02/12/2016   Tobacco use disorder 02/12/2016   Migraine headache 01/09/2016   Lymphocytosis 12/21/2015   Hypertension 12/12/2015   Mixed hyperlipidemia 12/12/2015   Vitamin D deficiency 12/12/2015   Mitral regurgitation 12/12/2015   Hypertensive urgency 11/29/2015   Abdominal pulsatile mass 11/29/2015    Oletta Cohn, OTR/L,CLT 03/03/2023, 1:03 PM  [facility] [address], [place], [address] Phone: [phone]   Fax:  [phone]  Name: Ariyan KANCHAN LIEFER MRN: [id number] Date of Birth: 1966-03-06
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
## 2023-03-10 ENCOUNTER — Ambulatory Visit: Payer: Medicaid Other | Admitting: Occupational Therapy

## 2023-03-10 ENCOUNTER — Ambulatory Visit: Payer: Medicaid Other

## 2023-03-10 DIAGNOSIS — M6281 Muscle weakness (generalized): Secondary | ICD-10-CM

## 2023-03-10 DIAGNOSIS — R600 Localized edema: Secondary | ICD-10-CM

## 2023-03-10 DIAGNOSIS — M545 Low back pain, unspecified: Secondary | ICD-10-CM

## 2023-03-10 DIAGNOSIS — M25641 Stiffness of right hand, not elsewhere classified: Secondary | ICD-10-CM

## 2023-03-10 DIAGNOSIS — R262 Difficulty in walking, not elsewhere classified: Secondary | ICD-10-CM

## 2023-03-10 DIAGNOSIS — M79604 Pain in right leg: Secondary | ICD-10-CM

## 2023-03-10 DIAGNOSIS — M79641 Pain in right hand: Secondary | ICD-10-CM

## 2023-03-10 NOTE — Therapy (Signed)
OUTPATIENT PHYSICAL THERAPY THORACOLUMBAR EVALUATION   Patient Name: Heather Mahoney MRN: [id number] DOB:1966/05/12, 57 y.o., female Today's Date: 03/10/2023  END OF SESSION:  PT End of Session - 03/10/23 0817     Visit Number 2    Number of Visits 8    Date for PT Re-Evaluation 04/28/23    PT Start Time 0818    PT Stop Time 0900    PT Time Calculation (min) 42 min             Past Medical History:  Diagnosis Date   CAD (coronary artery disease)    a. PCI to proximal and distal RCA 3/26   Diabetes mellitus without complication (HCC)    GERD (gastroesophageal reflux disease)    Heart murmur    MR   Hiatal hernia    Hot flashes    Hyperlipidemia    Hypertension    Myocardial infarction (HCC)    Neuropathy    Tobacco abuse    a. Started at age 45, quit during 3 pregnancies. b. 1 PPD for a 40 pack-year hx (2019)    Past Surgical History:  Procedure Laterality Date   ABDOMINAL AORTIC ANEURYSM REPAIR     2019   BREAST BIOPSY Right    Multiple biopsies-all benign   BREAST SURGERY  1986   I&D for 'milk duct'   COLONOSCOPY WITH PROPOFOL N/A 07/07/2022   Procedure: COLONOSCOPY WITH PROPOFOL;  Surgeon: Wyline Mood, MD;  Location: [facility] ENDOSCOPY;  Service: Gastroenterology;  Laterality: N/A;   CORONARY STENT INTERVENTION N/A 01/13/2019   Procedure: CORONARY STENT INTERVENTION;  Surgeon: Yvonne Kendall, MD;  Location: [facility] INVASIVE CV LAB;  Service: Cardiovascular;  Laterality: N/A;   EMBOLECTOMY  08/13/2018   Procedure: Right SFA and profunda femoris Fogarty embolectomy 3  Fogarty embolectomy balloon;  Surgeon: Annice Needy, MD;  Location: [facility] ORS;  Service: Vascular;;   ENDARTERECTOMY FEMORAL Right 08/13/2018   Procedure: Right common femoral, profunda femoris, and superficial femoral artery endarterectomies and patch angioplasty ;  Surgeon: Annice Needy, MD;  Location: [facility] ORS;  Service: Vascular;  Laterality: Right;   ENDOVASCULAR REPAIR/STENT GRAFT N/A 08/13/2018    Procedure: ENDOVASCULAR REPAIR/STENT GRAFT;  Surgeon: Renford Dills, MD;  Location: [facility] INVASIVE CV LAB;  Service: Cardiovascular;  Laterality: N/A;   LEFT HEART CATH AND CORONARY ANGIOGRAPHY Left 01/13/2019   Procedure: LEFT HEART CATH AND CORONARY ANGIOGRAPHY;  Surgeon: Antonieta Iba, MD;  Location: [facility] INVASIVE CV LAB;  Service: Cardiovascular;  Laterality: Left;   LOWER EXTREMITY ANGIOGRAPHY Right 09/08/2022   Procedure: Lower Extremity Angiography;  Surgeon: Annice Needy, MD;  Location: [facility] INVASIVE CV LAB;  Service: Cardiovascular;  Laterality: Right;   LOWER EXTREMITY ANGIOGRAPHY Right 09/15/2022   Procedure: Lower Extremity Angiography;  Surgeon: Annice Needy, MD;  Location: [facility] INVASIVE CV LAB;  Service: Cardiovascular;  Laterality: Right;   SALPINGOOPHORECTOMY Left 2000   Patient Active Problem List   Diagnosis Date Noted   Encounter for screening colonoscopy    Adenomatous polyp of colon    Acute gastroenteritis 04/26/2020   History of endovascular stent graft for abdominal aortic aneurysm 04/26/2020   Urticaria 04/26/2020   Diabetes (HCC) 07/05/2019   Precordial chest pain    Chest pain 04/09/2019   Atherosclerosis of native arteries of extremity with intermittent claudication (HCC) 04/01/2019   Coronary artery disease of native artery of native heart with stable angina pectoris (HCC) 01/26/2019   CAD (coronary artery disease) 01/14/2019  Unstable angina (HCC) 01/13/2019   PAD (peripheral artery disease) (HCC) 08/30/2018   AAA (abdominal aortic aneurysm) (HCC) 08/10/2018   Chest pain with moderate risk for cardiac etiology 12/18/2017   Scotoma 12/18/2017   PVC (premature ventricular contraction) 02/12/2016   Essential hypertension, benign 02/12/2016   Tobacco use disorder 02/12/2016   Migraine headache 01/09/2016   Lymphocytosis 12/21/2015   Hypertension 12/12/2015   Mixed hyperlipidemia 12/12/2015   Vitamin D deficiency 12/12/2015   Mitral regurgitation  12/12/2015   Hypertensive urgency 11/29/2015   Abdominal pulsatile mass 11/29/2015    PCP: [facility]  REFERRING PROVIDER: Drake Leach, PA-C  REFERRING DIAG:   [phone] (ICD-10-CM) - Chronic bilateral low back pain with right-sided sciatica  M54.16 (ICD-10-CM) - Lumbar radiculopathy    Rationale for Evaluation and Treatment: Rehabilitation  THERAPY DIAGNOSIS:  Lumbar pain  Muscle weakness (generalized)  Difficulty in walking, not elsewhere classified  Pain in right leg  ONSET DATE: Years  SUBJECTIVE:                                                                                                                                                                                           SUBJECTIVE STATEMENT:  Pt reports she is tired from playing with the grand kids.  Pt notes she is otherwise doing well.    PERTINENT HISTORY:   Pt has PMH including: CAD, GERD, Heart Murmur, Hiatal hernia, hyperlipidemia, HTN, MI, neuropathy,  tobacco abuse  PAIN:  Are you having pain? Yes: NPRS scale: 5/10 Pain location: Center of lower back Pain description: dull ache Aggravating factors: walking or standing for prolonged period time Relieving factors: sitting  PRECAUTIONS: None, but has several stints  WEIGHT BEARING RESTRICTIONS: No  FALLS:  Has patient fallen in last 6 months? No  LIVING ENVIRONMENT: Lives with: lives with their daughter Lives in: House/apartment Stairs: Yes: External: 5 steps; on right going up Has following equipment at home: None  OCCUPATION: On disability  PLOF: Independent  PATIENT GOALS: Relieve pain  NEXT MD VISIT: When finished with PT  OBJECTIVE:   DIAGNOSTIC FINDINGS:    EXAM: MRI CERVICAL SPINE WITHOUT CONTRAST  IMPRESSION: 1. Normal MRI appearance of the cervical spinal cord. No cord signal changes to suggest myelopathy. 2. Right paracentral disc osteophyte complex at C4-5 with resultant mild canal and  right C5 foraminal stenosis. 3. Disc bulging with uncovertebral spurring at C5-6 and C6-7 with resultant mild spinal stenosis.  PATIENT SURVEYS:  FOTO 29/42  SCREENING FOR RED FLAGS: Bowel or bladder incontinence: No Spinal tumors: No Cauda equina syndrome: No Compression fracture: No Abdominal aneurysm: Yes: but was  repaired back in 2019.  COGNITION: Overall cognitive status: Within functional limits for tasks assessed     SENSATION: WFL   POSTURE: rounded shoulders, forward head, and posterior pelvic tilt  PALPATION: Pt has tenderness to palpation along the L paraspinals in the lumbar region.  Pt also has muscle guarding when performing PAVM's in the lumbar spine.    LUMBAR ROM:   AROM eval  Flexion 100%  Extension 60%  Right lateral flexion 90%  Left lateral flexion 90%  Right rotation 95%  Left rotation 95%   (Blank rows = not tested)  LOWER EXTREMITY ROM:     Active  Right eval Left eval  Hip flexion    Hip extension    Hip abduction    Hip adduction    Hip internal rotation    Hip external rotation    Knee flexion    Knee extension    Ankle dorsiflexion    Ankle plantarflexion    Ankle inversion    Ankle eversion     (Blank rows = not tested)  LOWER EXTREMITY MMT:    MMT Right eval Left eval  Hip flexion 4 4  Hip extension    Hip abduction 4 4  Hip adduction 4 4  Hip internal rotation    Hip external rotation    Knee flexion 4 4  Knee extension 4+ 4+  Ankle dorsiflexion 4 5  Ankle plantarflexion    Ankle inversion    Ankle eversion     (Blank rows = not tested)  LUMBAR SPECIAL TESTS:  Straight leg raise test: Negative, Slump test: Negative, and FABER test: Negative  FUNCTIONAL TESTS:  5 times sit to stand: 34.04 10 meter walk test: 0.89 m/s  GAIT: Distance walked: 20' Assistive device utilized: None Level of assistance: SBA Comments: Pt with slowed gait pattern.  TODAY'S TREATMENT: DATE: 03/10/23   TherEx:  NuStep,  seat 8, arms 10, level 1 for increased mobility of the lumbar spine and LE's Supine LTR, 2x10 Supine posterior pelvic tilt, 3 sec holds, 2x10 Supine B hamstring stretch, SLR, 30 sec bouts Supine B hamstring stretch, bent knee, 30 sec bouts Supine B figure 4 stretch, 30 sec bouts Supine B piriformis stretch, 30 sec bouts Supine SKTC, 3 sec hold, x10 each LE Supine DKTC, 3 sec hold, x10 Hooklying bridging with verbal cues for glute squeeze prior to lift off, 2x10 Hooklying PPT, with tactile cues, 2x10, 2-3 sec holds Hooklying alt toe taps 2x 20 with cuing initially for TA contraction with good carry over  Seated on theraball TA marching 2x15 with good carry over of demo for technique  Quadruped cat/camel, attempted multiple times however pt is unable to perform correctly, will continue to perform and assess progress. Quadruped bird dog, x10 each LE/UE pairing with increased difficulty      PATIENT EDUCATION:  Education details: Pt educated on role of PT and services provided during current POC, along with prognosis and information about the clinic. Person educated: Patient Education method: Explanation Education comprehension: verbalized understanding  HOME EXERCISE PROGRAM: Access Code: [email or web address] URL: [email or web address] Date: 03/03/2023 Prepared by: Tomasa Hose  Exercises - Supine Lower Trunk Rotation  - 1 x daily - 7 x weekly - 3 sets - 10 reps - Supine Bridge  - 1 x daily - 7 x weekly - 3 sets - 10 reps - Supine Posterior Pelvic Tilt  - 1 x daily - 7 x weekly - 3 sets - 10 reps - Supine Hip  Adduction Isometric with Ball  - 1 x daily - 7 x weekly - 3 sets - 10 reps - Supine Piriformis Stretch with Foot on Ground  - 1 x daily - 7 x weekly - 3 sets - 10 reps  ASSESSMENT:  CLINICAL IMPRESSION:  Pt responded well to the exercises and put forth great effort throughout the session.  Pt does have difficulty with cat/camels due to inability to mobilize the spine  and have the core stability to perform which will continue to be worked on during future sessions.  Pt noted an increase in ROM however and will continue to benefit from skilled therapy to address these deficits.   Pt will continue to benefit from skilled therapy to address remaining deficits in order to improve overall QoL and return to PLOF.         OBJECTIVE IMPAIRMENTS: Abnormal gait, decreased activity tolerance, decreased mobility, difficulty walking, decreased ROM, decreased strength, obesity, and pain.   ACTIVITY LIMITATIONS: lifting, bending, standing, and locomotion level  PARTICIPATION LIMITATIONS: meal prep, cleaning, laundry, community activity, and yard work  PERSONAL FACTORS: Age, Behavior pattern, Fitness, Past/current experiences, Profession, Sex, Time since onset of injury/illness/exacerbation, and 3+ comorbidities: DM2, heart murmur, Htn, MI, tobacco use  are also affecting patient's functional outcome.   REHAB POTENTIAL: Good  CLINICAL DECISION MAKING: Stable/uncomplicated  EVALUATION COMPLEXITY: Low   GOALS: Goals reviewed with patient? Yes  SHORT TERM GOALS: Target date: 03/31/2023  Pt will be independent with HEP in order to demonstrate increased ability to perform tasks related to occupation/hobbies. Baseline:  Pt given exercises to perform at home. Goal status: INITIAL   LONG TERM GOALS: Target date: 05/26/2023  1.  Patient (> 26 years old) will complete five times sit to stand test in < 15 seconds indicating an increased LE strength and improved balance. Baseline: 34.05 sec Goal status: INITIAL  2.  Patient will increase FOTO score to equal to or greater than 42 to demonstrate statistically significant improvement in mobility and quality of life.  Baseline: 29 Goal status: INITIAL   3.  Patient will reduce timed up and go to <11 seconds to reduce fall risk and demonstrate improved transfer/gait ability. Baseline: Not performed at initial visit Goal  status: INITIAL  4.  Patient will increase 10 meter walk test to >1.25m/s as to improve gait speed for better community ambulation and to reduce fall risk. Baseline: 0.89 m/s Goal status: INITIAL  5.  Patient will report 2/10 pain after standing in one location for >15 minutes in order simulate pt being able to do daily tasks such as washing dishes.   Baseline: 5/10 pain at rest Goal status: INITIAL    PLAN:  PT FREQUENCY: 2x/week  PT DURATION: 8 weeks  PLANNED INTERVENTIONS: Therapeutic exercises, Therapeutic activity, Neuromuscular re-education, Balance training, Gait training, Patient/Family education, Self Care, Joint mobilization, Joint manipulation, Stair training, Vestibular training, DME instructions, Aquatic Therapy, Dry Needling, Electrical stimulation, Spinal manipulation, Spinal mobilization, Cryotherapy, Moist heat, Traction, Manual therapy, and Re-evaluation.  PLAN FOR NEXT SESSION: Assess compliance with HEP and continue with lumbar mobility and core strengthening exercises.    Nolon Bussing, PT, DPT Physical Therapist - [facility]  03/10/23, 9:22 AM

## 2023-03-10 NOTE — Therapy (Signed)
[facility] [address], [place], [address] Phone: [phone]   Fax:  [phone]  Occupational Therapy Treatment  Patient Details  Name: Heather Mahoney MRN: [id number] Date of Birth: July 19, 1966 Referring Provider (OT): Emma Georgia   Encounter Date: 03/10/2023   OT End of Session - 03/10/23 0920     Visit Number 2    Number of Visits 6    Date for OT Re-Evaluation 04/14/23    OT Start Time 0900    OT Stop Time 0944    OT Time Calculation (min) 44 min    Activity Tolerance Patient tolerated treatment well    Behavior During Therapy [facility] for tasks assessed/performed             Past Medical History:  Diagnosis Date   CAD (coronary artery disease)    a. PCI to proximal and distal RCA 3/26   Diabetes mellitus without complication (HCC)    GERD (gastroesophageal reflux disease)    Heart murmur    MR   Hiatal hernia    Hot flashes    Hyperlipidemia    Hypertension    Myocardial infarction (HCC)    Neuropathy    Tobacco abuse    a. Started at age 12, quit during 3 pregnancies. b. 1 PPD for a 40 pack-year hx (2019)     Past Surgical History:  Procedure Laterality Date   ABDOMINAL AORTIC ANEURYSM REPAIR     2019   BREAST BIOPSY Right    Multiple biopsies-all benign   BREAST SURGERY  1986   I&D for 'milk duct'   COLONOSCOPY WITH PROPOFOL N/A 07/07/2022   Procedure: COLONOSCOPY WITH PROPOFOL;  Surgeon: Wyline Mood, MD;  Location: [facility] ENDOSCOPY;  Service: Gastroenterology;  Laterality: N/A;   CORONARY STENT INTERVENTION N/A 01/13/2019   Procedure: CORONARY STENT INTERVENTION;  Surgeon: Yvonne Kendall, MD;  Location: [facility] INVASIVE CV LAB;  Service: Cardiovascular;  Laterality: N/A;   EMBOLECTOMY  08/13/2018   Procedure: Right SFA and profunda femoris Fogarty embolectomy 3  Fogarty embolectomy balloon;  Surgeon: Annice Needy, MD;  Location: [facility] ORS;  Service: Vascular;;   ENDARTERECTOMY FEMORAL Right  08/13/2018   Procedure: Right common femoral, profunda femoris, and superficial femoral artery endarterectomies and patch angioplasty ;  Surgeon: Annice Needy, MD;  Location: [facility] ORS;  Service: Vascular;  Laterality: Right;   ENDOVASCULAR REPAIR/STENT GRAFT N/A 08/13/2018   Procedure: ENDOVASCULAR REPAIR/STENT GRAFT;  Surgeon: Renford Dills, MD;  Location: [facility] INVASIVE CV LAB;  Service: Cardiovascular;  Laterality: N/A;   LEFT HEART CATH AND CORONARY ANGIOGRAPHY Left 01/13/2019   Procedure: LEFT HEART CATH AND CORONARY ANGIOGRAPHY;  Surgeon: Antonieta Iba, MD;  Location: [facility] INVASIVE CV LAB;  Service: Cardiovascular;  Laterality: Left;   LOWER EXTREMITY ANGIOGRAPHY Right 09/08/2022   Procedure: Lower Extremity Angiography;  Surgeon: Annice Needy, MD;  Location: [facility] INVASIVE CV LAB;  Service: Cardiovascular;  Laterality: Right;   LOWER EXTREMITY ANGIOGRAPHY Right 09/15/2022   Procedure: Lower Extremity Angiography;  Surgeon: Annice Needy, MD;  Location: [facility] INVASIVE CV LAB;  Service: Cardiovascular;  Laterality: Right;   SALPINGOOPHORECTOMY Left 2000    There were no vitals filed for this visit.   Subjective Assessment - 03/10/23 0917     Subjective  Swelling is better in my middle finger- wearing the silicon sleeve- stiffness in my middle finger better- do not feel pull all the way down into the wrist  Pertinent History Patient had 12/03/2022 right third digit trigger finger release done by Dr. Rosita Kea.  Patient reports she had this for 10 years prior.  Patient referred to OT for continued stiffness and pain in right third digit.    Patient Stated Goals I want to bend the middle finger and be able to make a fist to use it at home with cooking and sewing and playing with her 58-year-old grandchild    Currently in Pain? Yes    Pain Score 5     Pain Location Hand   middle finger and palm   Pain Orientation Right    Pain Descriptors / Indicators Aching;Tightness;Tender    Pain Type  Surgical pain    Pain Onset More than a month ago                [facility] OT Assessment - 03/10/23 0001       Strength   Right Hand Lateral Pinch 14 lbs    Right Hand 3 Point Pinch 10 lbs      Right Hand AROM   R Long  MCP 0-90 90 Degrees    R Long PIP 0-100 85 Degrees    R Long DIP 0-70 50 Degrees              Patient arrive with increased active range of motion at third PIP and DIP as well as composite. Continue to feel a pull with some discomfort over the dorsal third digit with flexion as well as in the palm proximal to scar but not into the wrist. Still tenderness over the third A1 pulley.         OT Treatments/Exercises (OP) - 03/10/23 0001       RUE Paraffin   Number Minutes Paraffin 8 Minutes    RUE Paraffin Location Hand   hand   Comments pror to ROM and scar tissue            Scar massage done by OT as well as Graston tool #2 with brushing and sweeping over the volar digits and palm as well as forearm prior to composite extension as well as composite flexion. Use extractor on scar with third digit extension passive range of motion as well as DIP PIP flexion Patient feels strong stinging or pull.    Patient to use some moist heat or contrast or can look into a paraffin to use at home prior to soft tissue massage  and using red roller for extension 20 reps pain-free Followed by tendon glides for MC flexion  with PIP extension 10 reps Passive range of motion for intrinsic fist Fabricated this date DIP/PIP strap to use 2 to 3 minutes prior to active range of motion intrinsic fist 10 reps each Followed by composite fist -passive range of motion in a pain-free range 10 reps Then place and hold composite fist - increase to PIP 90 and DIP 55  Patient to enlarge grips in the meantime to decrease pain in 3rd digit and palm Also to use larger joints like palms or forearm to carry or lift      Patient to wear compression silicone sleeve on third digit at  nighttime to decrease edema and PIP         OT Education - 03/10/23 0920     Education Details progress and HEP    Person(s) Educated Patient    Methods Explanation;Demonstration;Tactile cues;Verbal cues    Comprehension Verbal cues required;Returned demonstration;Verbalized understanding  OT Long Term Goals - 03/03/23 1254       OT LONG TERM GOAL #1   Title Patient be independent in home program to decrease edema and pain and increase range of motion of third digit to touch palm without increased symptoms    Baseline Edema in the third digit as well as pain 4/10 with active range of motion and on the dorsal 3rd digit as well as palm    Time 3    Period Weeks    Status New    Target Date 03/24/23      OT LONG TERM GOAL #2   Title Third digit active range of motion increased to within normal limits for patient to hold knife, toothbrush and broom without increase symptoms    Baseline PIP flexion 75 degrees and DIP 40.  Pain with flexion 4/10    Time 4    Period Weeks    Status New    Target Date 03/31/23      OT LONG TERM GOAL #3   Title Right grip and prehension strength increased to within normal limits for her age without increase symptoms for patient to hold broom this left pots and pain and apply with child    Baseline Grip 44 and lateral pinch 14.  3-point pinch 7 pounds on the right with discomfort, left 9 pounds    Time 6    Period Weeks    Status New    Target Date 04/14/23                   Plan - 03/10/23 1610     Clinical Impression Statement Patient presented OT evaluation with a diagnosis of right third digit trigger finger release on 11/25/2022 by Dr. Rosita Kea.  Patient report she had trigger finger for about 10 years.  Patient do have arthritis with stiffness in the hands as well as history of being diabetic.  Patient scar tissue doing very well.  Patient with decreased PIP extension of the third -10, Active flexion 75 at PIP and  DIP 40.  Pt arrive this date with increase PIP flexion to 85 and DIP 50. Patient with stiffness and pain in the third digit with intrinsic a fist and composite flexion.  Patient with increased pain with decreased use of right dominant hand in ADLs and IADLs.  Patient can benefit from OT services to decrease pain and edema and wrist motion and length for patient to return to prior level of function.    OT Occupational Profile and History Problem Focused Assessment - Including review of records relating to presenting problem    Occupational performance deficits (Please refer to evaluation for details): ADL's;IADL's;Play;Social Participation;Leisure    Body Structure / Function / Physical Skills ADL;Decreased knowledge of use of DME;Flexibility;ROM;UE functional use;Dexterity;Edema;Pain;Strength;IADL;Sensation    Rehab Potential Fair    Clinical Decision Making Limited treatment options, no task modification necessary    Comorbidities Affecting Occupational Performance: May have comorbidities impacting occupational performance    Modification or Assistance to Complete Evaluation  No modification of tasks or assist necessary to complete eval    OT Frequency 1x / week    OT Duration 6 weeks    OT Treatment/Interventions Self-care/ADL training;Ultrasound;Moist Heat;Paraffin;Fluidtherapy;Contrast Bath;DME and/or AE instruction;Manual Therapy;Passive range of motion;Scar mobilization;Splinting;Patient/family education;Therapeutic exercise    Consulted and Agree with Plan of Care Patient             Patient will benefit from skilled therapeutic intervention in order to  improve the following deficits and impairments:   Body Structure / Function / Physical Skills: ADL, Decreased knowledge of use of DME, Flexibility, ROM, UE functional use, Dexterity, Edema, Pain, Strength, IADL, Sensation       Visit Diagnosis: Stiffness of right hand, not elsewhere classified  Localized edema  Pain in right  hand    Problem List Patient Active Problem List   Diagnosis Date Noted   Encounter for screening colonoscopy    Adenomatous polyp of colon    Acute gastroenteritis 04/26/2020   History of endovascular stent graft for abdominal aortic aneurysm 04/26/2020   Urticaria 04/26/2020   Diabetes (HCC) 07/05/2019   Precordial chest pain    Chest pain 04/09/2019   Atherosclerosis of native arteries of extremity with intermittent claudication (HCC) 04/01/2019   Coronary artery disease of native artery of native heart with stable angina pectoris (HCC) 01/26/2019   CAD (coronary artery disease) 01/14/2019   Unstable angina (HCC) 01/13/2019   PAD (peripheral artery disease) (HCC) 08/30/2018   AAA (abdominal aortic aneurysm) (HCC) 08/10/2018   Chest pain with moderate risk for cardiac etiology 12/18/2017   Scotoma 12/18/2017   PVC (premature ventricular contraction) 02/12/2016   Essential hypertension, benign 02/12/2016   Tobacco use disorder 02/12/2016   Migraine headache 01/09/2016   Lymphocytosis 12/21/2015   Hypertension 12/12/2015   Mixed hyperlipidemia 12/12/2015   Vitamin D deficiency 12/12/2015   Mitral regurgitation 12/12/2015   Hypertensive urgency 11/29/2015   Abdominal pulsatile mass 11/29/2015    Oletta Cohn, OTR/L,CLT 03/10/2023, 11:42 AM  [facility] [address] [place], [address] Phone: [phone]   Fax:  [phone]  Name: Heather Mahoney MRN: [id number] Date of Birth: [birth year]

## 2023-03-12 ENCOUNTER — Ambulatory Visit: Payer: Medicaid Other | Admitting: Occupational Therapy

## 2023-03-12 ENCOUNTER — Ambulatory Visit: Payer: Medicaid Other

## 2023-03-13 ENCOUNTER — Telehealth: Payer: Self-pay | Admitting: Occupational Therapy

## 2023-03-13 NOTE — Telephone Encounter (Signed)
Called patient in regards to her missed appt yesterday and patient stated she never had appts on Thursday, she is only coming on Tuesday.  She does not have rides for Thursdays.  Confirmed that she will come to her Tuesday appt and she said yes, and I also let her know that I removed the Thursday appts.  Heather Mahoney

## 2023-03-17 ENCOUNTER — Ambulatory Visit: Payer: Medicaid Other | Admitting: Occupational Therapy

## 2023-03-17 ENCOUNTER — Ambulatory Visit: Payer: Medicaid Other

## 2023-03-17 DIAGNOSIS — M25641 Stiffness of right hand, not elsewhere classified: Secondary | ICD-10-CM

## 2023-03-17 DIAGNOSIS — M79604 Pain in right leg: Secondary | ICD-10-CM

## 2023-03-17 DIAGNOSIS — M6281 Muscle weakness (generalized): Secondary | ICD-10-CM

## 2023-03-17 DIAGNOSIS — M545 Low back pain, unspecified: Secondary | ICD-10-CM

## 2023-03-17 DIAGNOSIS — M79641 Pain in right hand: Secondary | ICD-10-CM

## 2023-03-17 DIAGNOSIS — R600 Localized edema: Secondary | ICD-10-CM

## 2023-03-17 DIAGNOSIS — R262 Difficulty in walking, not elsewhere classified: Secondary | ICD-10-CM

## 2023-03-17 NOTE — Therapy (Signed)
[facility] [address], [place], [address] Phone: [phone]   Fax:  [phone]  Occupational Therapy Treatment  Patient Details  Name: Heather Mahoney MRN: [id number] Date of Birth: October 25, 1965 Referring Provider (OT): Bonifay Georgia   Encounter Date: 03/17/2023   OT End of Session - 03/17/23 1534     Visit Number 3    Number of Visits 6    Date for OT Re-Evaluation 04/14/23    OT Start Time 0900    OT Stop Time 0944    OT Time Calculation (min) 44 min    Activity Tolerance Patient tolerated treatment well    Behavior During Therapy [facility] for tasks assessed/performed             Past Medical History:  Diagnosis Date   CAD (coronary artery disease)    a. PCI to proximal and distal RCA 3/26   Diabetes mellitus without complication (HCC)    GERD (gastroesophageal reflux disease)    Heart murmur    MR   Hiatal hernia    Hot flashes    Hyperlipidemia    Hypertension    Myocardial infarction (HCC)    Neuropathy    Tobacco abuse    a. Started at age 45, quit during 3 pregnancies. b. 1 PPD for a 40 pack-year hx (2019)     Past Surgical History:  Procedure Laterality Date   ABDOMINAL AORTIC ANEURYSM REPAIR     2019   BREAST BIOPSY Right    Multiple biopsies-all benign   BREAST SURGERY  1986   I&D for 'milk duct'   COLONOSCOPY WITH PROPOFOL N/A 07/07/2022   Procedure: COLONOSCOPY WITH PROPOFOL;  Surgeon: Wyline Mood, MD;  Location: [facility] ENDOSCOPY;  Service: Gastroenterology;  Laterality: N/A;   CORONARY STENT INTERVENTION N/A 01/13/2019   Procedure: CORONARY STENT INTERVENTION;  Surgeon: Yvonne Kendall, MD;  Location: [facility] INVASIVE CV LAB;  Service: Cardiovascular;  Laterality: N/A;   EMBOLECTOMY  08/13/2018   Procedure: Right SFA and profunda femoris Fogarty embolectomy 3  Fogarty embolectomy balloon;  Surgeon: Annice Needy, MD;  Location: [facility] ORS;  Service: Vascular;;   ENDARTERECTOMY FEMORAL Right  08/13/2018   Procedure: Right common femoral, profunda femoris, and superficial femoral artery endarterectomies and patch angioplasty ;  Surgeon: Annice Needy, MD;  Location: [facility] ORS;  Service: Vascular;  Laterality: Right;   ENDOVASCULAR REPAIR/STENT GRAFT N/A 08/13/2018   Procedure: ENDOVASCULAR REPAIR/STENT GRAFT;  Surgeon: Renford Dills, MD;  Location: [facility] INVASIVE CV LAB;  Service: Cardiovascular;  Laterality: N/A;   LEFT HEART CATH AND CORONARY ANGIOGRAPHY Left 01/13/2019   Procedure: LEFT HEART CATH AND CORONARY ANGIOGRAPHY;  Surgeon: Antonieta Iba, MD;  Location: [facility] INVASIVE CV LAB;  Service: Cardiovascular;  Laterality: Left;   LOWER EXTREMITY ANGIOGRAPHY Right 09/08/2022   Procedure: Lower Extremity Angiography;  Surgeon: Annice Needy, MD;  Location: [facility] INVASIVE CV LAB;  Service: Cardiovascular;  Laterality: Right;   LOWER EXTREMITY ANGIOGRAPHY Right 09/15/2022   Procedure: Lower Extremity Angiography;  Surgeon: Annice Needy, MD;  Location: [facility] INVASIVE CV LAB;  Service: Cardiovascular;  Laterality: Right;   SALPINGOOPHORECTOMY Left 2000    There were no vitals filed for this visit.   Subjective Assessment - 03/17/23 1531     Subjective  My dog ate my silicon sleeve on the middle finger.  Doing a lot better I think with motion    Pertinent History Patient had 12/03/2022 right third digit  trigger finger release done by Dr. Rosita Kea.  Patient reports she had this for 10 years prior.  Patient referred to OT for continued stiffness and pain in right third digit.    Patient Stated Goals I want to bend the middle finger and be able to make a fist to use it at home with cooking and sewing and playing with her 76-year-old grandchild    Currently in Pain? Yes    Pain Score 2     Pain Location Hand    Pain Orientation Right    Pain Descriptors / Indicators Aching;Tightness;Tender    Pain Type Surgical pain    Pain Onset More than a month ago    Pain Frequency Intermittent                 [facility] OT Assessment - 03/17/23 0001       Strength   Right Hand Grip (lbs) 50    Right Hand Lateral Pinch 14 lbs    Right Hand 3 Point Pinch 10 lbs    Left Hand Grip (lbs) 36    Left Hand Lateral Pinch 13 lbs    Left Hand 3 Point Pinch 9 lbs      Right Hand AROM   R Long  MCP 0-90 90 Degrees    R Long PIP 0-100 85 Degrees    R Long DIP 0-70 65 Degrees                      OT Treatments/Exercises (OP) - 03/17/23 0001       RUE Paraffin   Number Minutes Paraffin 8 Minutes    RUE Paraffin Location Hand    Comments prior to ROM and soft tissue             Scar massage done by OT as well as Graston tool #2 with brushing and sweeping over the volar digits and palm as well as forearm prior to composite extension as well as composite flexion. Use extractor on scar with third digit extension passive range of motion as well as DIP PIP flexion Patient feels strong stinging or pull.       Patient to use some moist heat or contrast or can look into a paraffin to use at home prior to soft tissue massage  and using red roller for extension 20 reps pain-free Can work on hyper extention now - able to get full PIP extention in session  Followed by tendon glides for MC flexion  with PIP extension 10 reps Passive range of motion for intrinsic fist Cont with DIP/PIP strap to use 2 to 3 minutes prior to active range of motion intrinsic fist 10 reps each Followed by composite fist -passive range of motion in a pain-free range 10 reps Then place and hold composite fist - increase to PIP 90 and DIP 65 Pt did had one episode of triggering in session - after pulling strong into PIP/DIP flexion with prox phalanges block  Patient to enlarge grips in the meantime to decrease pain in 3rd digit and palm- less pain this date in palm Also to use larger joints like palms or forearm to carry or lift      Patient to wear compression silicone sleeve on third digit at  nighttime to decrease edema and PIP         OT Education - 03/17/23 1534     Education Details progress and HEP    Person(s) Educated Patient  Methods Explanation;Demonstration;Tactile cues;Verbal cues    Comprehension Verbal cues required;Returned demonstration;Verbalized understanding                 OT Long Term Goals - 03/03/23 1254       OT LONG TERM GOAL #1   Title Patient be independent in home program to decrease edema and pain and increase range of motion of third digit to touch palm without increased symptoms    Baseline Edema in the third digit as well as pain 4/10 with active range of motion and on the dorsal 3rd digit as well as palm    Time 3    Period Weeks    Status New    Target Date 03/24/23      OT LONG TERM GOAL #2   Title Third digit active range of motion increased to within normal limits for patient to hold knife, toothbrush and broom without increase symptoms    Baseline PIP flexion 75 degrees and DIP 40.  Pain with flexion 4/10    Time 4    Period Weeks    Status New    Target Date 03/31/23      OT LONG TERM GOAL #3   Title Right grip and prehension strength increased to within normal limits for her age without increase symptoms for patient to hold broom this left pots and pain and apply with child    Baseline Grip 44 and lateral pinch 14.  3-point pinch 7 pounds on the right with discomfort, left 9 pounds    Time 6    Period Weeks    Status New    Target Date 04/14/23                   Plan - 03/17/23 1534     Clinical Impression Statement Patient presented OT evaluation with a diagnosis of right third digit trigger finger release on 11/25/2022 by Dr. Rosita Kea.  Patient report she had trigger finger for about 10 years.  Patient do have arthritis with stiffness in the hands as well as history of being diabetic.  Patient scar tissue doing very well.  Patient with decreased PIP extension of the third -10, Active flexion 75 at PIP and  DIP 40.  Pt arrive this date with increase PIP flexion to 85 and DIP 65. Patient with stiffness and pain in the third digit with intrinsic a fist and composite flexion.  Patient with increased pain with decreased use of right dominant hand in ADLs and IADLs.  Patient can benefit from OT services to decrease pain and edema and wrist motion and length for patient to return to prior level of function.    OT Occupational Profile and History Problem Focused Assessment - Including review of records relating to presenting problem    Occupational performance deficits (Please refer to evaluation for details): ADL's;IADL's;Play;Social Participation;Leisure    Body Structure / Function / Physical Skills ADL;Decreased knowledge of use of DME;Flexibility;ROM;UE functional use;Dexterity;Edema;Pain;Strength;IADL;Sensation    Rehab Potential Fair    Clinical Decision Making Limited treatment options, no task modification necessary    Comorbidities Affecting Occupational Performance: May have comorbidities impacting occupational performance    Modification or Assistance to Complete Evaluation  No modification of tasks or assist necessary to complete eval    OT Frequency 1x / week    OT Duration 6 weeks    OT Treatment/Interventions Self-care/ADL training;Ultrasound;Moist Heat;Paraffin;Fluidtherapy;Contrast Bath;DME and/or AE instruction;Manual Therapy;Passive range of motion;Scar mobilization;Splinting;Patient/family education;Therapeutic exercise    Consulted  and Agree with Plan of Care Patient             Patient will benefit from skilled therapeutic intervention in order to improve the following deficits and impairments:   Body Structure / Function / Physical Skills: ADL, Decreased knowledge of use of DME, Flexibility, ROM, UE functional use, Dexterity, Edema, Pain, Strength, IADL, Sensation       Visit Diagnosis: Muscle weakness (generalized)  Stiffness of right hand, not elsewhere classified  Pain  in right hand  Localized edema    Problem List Patient Active Problem List   Diagnosis Date Noted   Encounter for screening colonoscopy    Adenomatous polyp of colon    Acute gastroenteritis 04/26/2020   History of endovascular stent graft for abdominal aortic aneurysm 04/26/2020   Urticaria 04/26/2020   Diabetes (HCC) 07/05/2019   Precordial chest pain    Chest pain 04/09/2019   Atherosclerosis of native arteries of extremity with intermittent claudication (HCC) 04/01/2019   Coronary artery disease of native artery of native heart with stable angina pectoris (HCC) 01/26/2019   CAD (coronary artery disease) 01/14/2019   Unstable angina (HCC) 01/13/2019   PAD (peripheral artery disease) (HCC) 08/30/2018   AAA (abdominal aortic aneurysm) (HCC) 08/10/2018   Chest pain with moderate risk for cardiac etiology 12/18/2017   Scotoma 12/18/2017   PVC (premature ventricular contraction) 02/12/2016   Essential hypertension, benign 02/12/2016   Tobacco use disorder 02/12/2016   Migraine headache 01/09/2016   Lymphocytosis 12/21/2015   Hypertension 12/12/2015   Mixed hyperlipidemia 12/12/2015   Vitamin D deficiency 12/12/2015   Mitral regurgitation 12/12/2015   Hypertensive urgency 11/29/2015   Abdominal pulsatile mass 11/29/2015    Oletta Cohn, OTR/L,CLT 03/17/2023, 3:37 PM  [facility] [address], [place], [address] Phone: [phone]   Fax:  [phone]  Name: Heather Mahoney MRN: [id number] Date of Birth: 1966-03-20

## 2023-03-17 NOTE — Therapy (Signed)
OUTPATIENT PHYSICAL THERAPY THORACOLUMBAR TREATMENT   Patient Name: Heather Mahoney MRN: [id number] DOB:[birth year], 57 y.o., female Today's Date: 03/17/2023  END OF SESSION:  PT End of Session - 03/17/23 0814     Visit Number 3    Number of Visits 8    Date for PT Re-Evaluation 04/28/23    PT Start Time 0815    PT Stop Time 0900    PT Time Calculation (min) 45 min             Past Medical History:  Diagnosis Date   CAD (coronary artery disease)    a. PCI to proximal and distal RCA 3/26   Diabetes mellitus without complication (HCC)    GERD (gastroesophageal reflux disease)    Heart murmur    MR   Hiatal hernia    Hot flashes    Hyperlipidemia    Hypertension    Myocardial infarction (HCC)    Neuropathy    Tobacco abuse    a. Started at age 29, quit during 3 pregnancies. b. 1 PPD for a 40 pack-year hx (2019)    Past Surgical History:  Procedure Laterality Date   ABDOMINAL AORTIC ANEURYSM REPAIR     2019   BREAST BIOPSY Right    Multiple biopsies-all benign   BREAST SURGERY  1986   I&D for 'milk duct'   COLONOSCOPY WITH PROPOFOL N/A 07/07/2022   Procedure: COLONOSCOPY WITH PROPOFOL;  Surgeon: Wyline Mood, MD;  Location: [facility] ENDOSCOPY;  Service: Gastroenterology;  Laterality: N/A;   CORONARY STENT INTERVENTION N/A 01/13/2019   Procedure: CORONARY STENT INTERVENTION;  Surgeon: Yvonne Kendall, MD;  Location: [facility] INVASIVE CV LAB;  Service: Cardiovascular;  Laterality: N/A;   EMBOLECTOMY  08/13/2018   Procedure: Right SFA and profunda femoris Fogarty embolectomy 3  Fogarty embolectomy balloon;  Surgeon: Annice Needy, MD;  Location: [facility] ORS;  Service: Vascular;;   ENDARTERECTOMY FEMORAL Right 08/13/2018   Procedure: Right common femoral, profunda femoris, and superficial femoral artery endarterectomies and patch angioplasty ;  Surgeon: Annice Needy, MD;  Location: [facility] ORS;  Service: Vascular;  Laterality: Right;   ENDOVASCULAR REPAIR/STENT GRAFT N/A 08/13/2018    Procedure: ENDOVASCULAR REPAIR/STENT GRAFT;  Surgeon: Renford Dills, MD;  Location: [facility] INVASIVE CV LAB;  Service: Cardiovascular;  Laterality: N/A;   LEFT HEART CATH AND CORONARY ANGIOGRAPHY Left 01/13/2019   Procedure: LEFT HEART CATH AND CORONARY ANGIOGRAPHY;  Surgeon: Antonieta Iba, MD;  Location: [facility] INVASIVE CV LAB;  Service: Cardiovascular;  Laterality: Left;   LOWER EXTREMITY ANGIOGRAPHY Right 09/08/2022   Procedure: Lower Extremity Angiography;  Surgeon: Annice Needy, MD;  Location: [facility] INVASIVE CV LAB;  Service: Cardiovascular;  Laterality: Right;   LOWER EXTREMITY ANGIOGRAPHY Right 09/15/2022   Procedure: Lower Extremity Angiography;  Surgeon: Annice Needy, MD;  Location: [facility] INVASIVE CV LAB;  Service: Cardiovascular;  Laterality: Right;   SALPINGOOPHORECTOMY Left 2000   Patient Active Problem List   Diagnosis Date Noted   Encounter for screening colonoscopy    Adenomatous polyp of colon    Acute gastroenteritis 04/26/2020   History of endovascular stent graft for abdominal aortic aneurysm 04/26/2020   Urticaria 04/26/2020   Diabetes (HCC) 07/05/2019   Precordial chest pain    Chest pain 04/09/2019   Atherosclerosis of native arteries of extremity with intermittent claudication (HCC) 04/01/2019   Coronary artery disease of native artery of native heart with stable angina pectoris (HCC) 01/26/2019   CAD (coronary artery disease) 01/14/2019  Unstable angina (HCC) 01/13/2019   PAD (peripheral artery disease) (HCC) 08/30/2018   AAA (abdominal aortic aneurysm) (HCC) 08/10/2018   Chest pain with moderate risk for cardiac etiology 12/18/2017   Scotoma 12/18/2017   PVC (premature ventricular contraction) 02/12/2016   Essential hypertension, benign 02/12/2016   Tobacco use disorder 02/12/2016   Migraine headache 01/09/2016   Lymphocytosis 12/21/2015   Hypertension 12/12/2015   Mixed hyperlipidemia 12/12/2015   Vitamin D deficiency 12/12/2015   Mitral regurgitation  12/12/2015   Hypertensive urgency 11/29/2015   Abdominal pulsatile mass 11/29/2015    PCP: [facility]  REFERRING PROVIDER: Drake Leach, PA-C  REFERRING DIAG:  [phone] (ICD-10-CM) - Chronic bilateral low back pain with right-sided sciatica  M54.16 (ICD-10-CM) - Lumbar radiculopathy    Rationale for Evaluation and Treatment: Rehabilitation  THERAPY DIAGNOSIS:  Lumbar pain  Muscle weakness (generalized)  Difficulty in walking, not elsewhere classified  Pain in right leg  ONSET DATE: Years  SUBJECTIVE:                                                                                                                                                                                           SUBJECTIVE STATEMENT:  Pt reports she was able to spend some time with her friend over the long weekend.  Pt notes she is doing well otherwise.    PERTINENT HISTORY:  Pt has PMH including: CAD, GERD, Heart Murmur, Hiatal hernia, hyperlipidemia, HTN, MI, neuropathy,  tobacco abuse  PAIN: Are you having pain? Yes: NPRS scale: 5/10 Pain location: Center of lower back Pain description: dull ache Aggravating factors: walking or standing for prolonged period time Relieving factors: sitting  PRECAUTIONS:  None, but has several stints  WEIGHT BEARING RESTRICTIONS: No  FALLS: Has patient fallen in last 6 months? No  LIVING ENVIRONMENT: Lives with: lives with their daughter Lives in: House/apartment Stairs: Yes: External: 5 steps; on right going up Has following equipment at home: None  OCCUPATION: On disability  PLOF: Independent  PATIENT GOALS: Relieve pain  NEXT MD VISIT: When finished with PT  OBJECTIVE:   DIAGNOSTIC FINDINGS:   EXAM: MRI CERVICAL SPINE WITHOUT CONTRAST  IMPRESSION: 1. Normal MRI appearance of the cervical spinal cord. No cord signal changes to suggest myelopathy. 2. Right paracentral disc osteophyte complex at C4-5 with  resultant mild canal and right C5 foraminal stenosis. 3. Disc bulging with uncovertebral spurring at C5-6 and C6-7 with resultant mild spinal stenosis.  PATIENT SURVEYS: FOTO 29/42  SCREENING FOR RED FLAGS: Bowel or bladder incontinence: No Spinal tumors: No Cauda equina syndrome: No Compression fracture: No Abdominal aneurysm: Yes: but was  repaired back in 2019.  COGNITION: Overall cognitive status: Within functional limits for tasks assessed     SENSATION: WFL   POSTURE: rounded shoulders, forward head, and posterior pelvic tilt  PALPATION:  Pt has tenderness to palpation along the L paraspinals in the lumbar region.  Pt also has muscle guarding when performing PAVM's in the lumbar spine.    LUMBAR ROM:  AROM eval  Flexion 100%  Extension 60%  Right lateral flexion 90%  Left lateral flexion 90%  Right rotation 95%  Left rotation 95%   (Blank rows = not tested)  LOWER EXTREMITY ROM:   Active  Right eval Left eval  Hip flexion    Hip extension    Hip abduction    Hip adduction    Hip internal rotation    Hip external rotation    Knee flexion    Knee extension    Ankle dorsiflexion    Ankle plantarflexion    Ankle inversion    Ankle eversion     (Blank rows = not tested)  LOWER EXTREMITY MMT:  MMT Right eval Left eval  Hip flexion 4 4  Hip extension    Hip abduction 4 4  Hip adduction 4 4  Hip internal rotation    Hip external rotation    Knee flexion 4 4  Knee extension 4+ 4+  Ankle dorsiflexion 4 5  Ankle plantarflexion    Ankle inversion    Ankle eversion     (Blank rows = not tested)  LUMBAR SPECIAL TESTS: Straight leg raise test: Negative, Slump test: Negative, and FABER test: Negative  FUNCTIONAL TESTS: 5 times sit to stand: 34.04 10 meter walk test: 0.89 m/s  GAIT: Distance walked: 20' Assistive device utilized: None Level of assistance: SBA Comments: Pt with slowed gait pattern.  TODAY'S TREATMENT: DATE: 03/17/23    TherEx:  NuStep, seat 8, arms 10, level 1 for increased mobility of the lumbar spine and LE's  Supine LTR, 2x10 Supine posterior pelvic tilt, 3 sec holds, 2x10 Supine B hamstring stretch, with strap,  SLR, 30 sec bouts Supine B figure 4 stretch, 30 sec bouts Supine B piriformis stretch, 30 sec bouts Supine ITB stretch with strap, 30 sec bouts each LE Supine SKTC, 3 sec hold, x10 each LE Supine DKTC, 3 sec hold, x10  Hooklying bridging with verbal cues for glute squeeze prior to lift off, 2x10 Hooklying PPT, with tactile cues, 2x10, 2-3 sec holds Hooklying alt toe taps 2x 20 with cuing initially for TA contraction with good carry over  Seated on theraball TA marching 2x15 with good carry over of demo for technique Quadruped cat/camel, pt performed with much better performance today       PATIENT EDUCATION:  Education details: Pt educated on role of PT and services provided during current POC, along with prognosis and information about the clinic. Person educated: Patient Education method: Explanation Education comprehension: verbalized understanding  HOME EXERCISE PROGRAM: Access Code: [email or web address] URL: [email or web address] Date: 03/03/2023 Prepared by: Tomasa Hose  Exercises - Supine Lower Trunk Rotation  - 1 x daily - 7 x weekly - 3 sets - 10 reps - Supine Bridge  - 1 x daily - 7 x weekly - 3 sets - 10 reps - Supine Posterior Pelvic Tilt  - 1 x daily - 7 x weekly - 3 sets - 10 reps - Supine Hip Adduction Isometric with Ball  - 1 x daily - 7 x weekly - 3 sets - 10 reps -  Supine Piriformis Stretch with Foot on Ground  - 1 x daily - 7 x weekly - 3 sets - 10 reps  ASSESSMENT:  CLINICAL IMPRESSION:  Pt performed well with the exercises today and is able to technique with the exercises.  Pt advised to continue with HEP in order to strengthen core and improve mobility of the spine.  Pt able to improve overall mobility of the lumbar/thoracic spine during  cat/camel in order to improve the overall proprioception of the spine.   Pt will continue to benefit from skilled therapy to address remaining deficits in order to improve overall QoL and return to PLOF.          OBJECTIVE IMPAIRMENTS: Abnormal gait, decreased activity tolerance, decreased mobility, difficulty walking, decreased ROM, decreased strength, obesity, and pain.   ACTIVITY LIMITATIONS: lifting, bending, standing, and locomotion level  PARTICIPATION LIMITATIONS: meal prep, cleaning, laundry, community activity, and yard work  PERSONAL FACTORS: Age, Behavior pattern, Fitness, Past/current experiences, Profession, Sex, Time since onset of injury/illness/exacerbation, and 3+ comorbidities: DM2, heart murmur, Htn, MI, tobacco use  are also affecting patient's functional outcome.   REHAB POTENTIAL: Good  CLINICAL DECISION MAKING: Stable/uncomplicated  EVALUATION COMPLEXITY: Low   GOALS: Goals reviewed with patient? Yes  SHORT TERM GOALS: Target date: 03/31/2023  Pt will be independent with HEP in order to demonstrate increased ability to perform tasks related to occupation/hobbies. Baseline:  Pt given exercises to perform at home. Goal status: INITIAL   LONG TERM GOALS: Target date: 05/26/2023  1.  Patient (> 29 years old) will complete five times sit to stand test in < 15 seconds indicating an increased LE strength and improved balance. Baseline: 34.05 sec Goal status: INITIAL  2.  Patient will increase FOTO score to equal to or greater than 42 to demonstrate statistically significant improvement in mobility and quality of life.  Baseline: 29 Goal status: INITIAL   3.  Patient will reduce timed up and go to <11 seconds to reduce fall risk and demonstrate improved transfer/gait ability. Baseline: Not performed at initial visit Goal status: INITIAL  4.  Patient will increase 10 meter walk test to >1.54m/s as to improve gait speed for better community ambulation and to  reduce fall risk. Baseline: 0.89 m/s Goal status: INITIAL  5.  Patient will report 2/10 pain after standing in one location for >15 minutes in order simulate pt being able to do daily tasks such as washing dishes.   Baseline: 5/10 pain at rest Goal status: INITIAL    PLAN:  PT FREQUENCY: 2x/week  PT DURATION: 8 weeks  PLANNED INTERVENTIONS: Therapeutic exercises, Therapeutic activity, Neuromuscular re-education, Balance training, Gait training, Patient/Family education, Self Care, Joint mobilization, Joint manipulation, Stair training, Vestibular training, DME instructions, Aquatic Therapy, Dry Needling, Electrical stimulation, Spinal manipulation, Spinal mobilization, Cryotherapy, Moist heat, Traction, Manual therapy, and Re-evaluation.  PLAN FOR NEXT SESSION: Assess compliance with HEP and continue with lumbar mobility and core strengthening exercises.    Nolon Bussing, PT, DPT Physical Therapist - [facility]  03/17/23, 9:31 AM

## 2023-03-24 ENCOUNTER — Ambulatory Visit: Payer: Medicaid Other | Attending: Orthopedic Surgery

## 2023-03-24 ENCOUNTER — Encounter: Payer: Medicaid Other | Admitting: Occupational Therapy

## 2023-03-24 DIAGNOSIS — M25641 Stiffness of right hand, not elsewhere classified: Secondary | ICD-10-CM | POA: Insufficient documentation

## 2023-03-24 DIAGNOSIS — R262 Difficulty in walking, not elsewhere classified: Secondary | ICD-10-CM | POA: Diagnosis present

## 2023-03-24 DIAGNOSIS — R600 Localized edema: Secondary | ICD-10-CM | POA: Insufficient documentation

## 2023-03-24 DIAGNOSIS — M79604 Pain in right leg: Secondary | ICD-10-CM | POA: Insufficient documentation

## 2023-03-24 DIAGNOSIS — M6281 Muscle weakness (generalized): Secondary | ICD-10-CM | POA: Insufficient documentation

## 2023-03-24 DIAGNOSIS — M79641 Pain in right hand: Secondary | ICD-10-CM | POA: Diagnosis present

## 2023-03-24 DIAGNOSIS — M545 Low back pain, unspecified: Secondary | ICD-10-CM | POA: Insufficient documentation

## 2023-03-24 NOTE — Therapy (Signed)
OUTPATIENT PHYSICAL THERAPY THORACOLUMBAR TREATMENT   Patient Name: Heather Mahoney MRN: [id number] DOB:[birth year], 57 y.o., female Today's Date: 03/24/2023  END OF SESSION:  PT End of Session - 03/24/23 0812     Visit Number 4    Number of Visits 8    Date for PT Re-Evaluation 04/28/23    PT Start Time 0814    PT Stop Time 0858    PT Time Calculation (min) 44 min    Activity Tolerance Patient tolerated treatment well    Behavior During Therapy [facility] for tasks assessed/performed             Past Medical History:  Diagnosis Date   CAD (coronary artery disease)    a. PCI to proximal and distal RCA 3/26   Diabetes mellitus without complication (HCC)    GERD (gastroesophageal reflux disease)    Heart murmur    MR   Hiatal hernia    Hot flashes    Hyperlipidemia    Hypertension    Myocardial infarction (HCC)    Neuropathy    Tobacco abuse    a. Started at age 87, quit during 3 pregnancies. b. 1 PPD for a 40 pack-year hx (2019)    Past Surgical History:  Procedure Laterality Date   ABDOMINAL AORTIC ANEURYSM REPAIR     2019   BREAST BIOPSY Right    Multiple biopsies-all benign   BREAST SURGERY  1986   I&D for 'milk duct'   COLONOSCOPY WITH PROPOFOL N/A 07/07/2022   Procedure: COLONOSCOPY WITH PROPOFOL;  Surgeon: Wyline Mood, MD;  Location: [facility];  Service: Gastroenterology;  Laterality: N/A;   CORONARY STENT INTERVENTION N/A 01/13/2019   Procedure: CORONARY STENT INTERVENTION;  Surgeon: Yvonne Kendall, MD;  Location: [facility] INVASIVE CV LAB;  Service: Cardiovascular;  Laterality: N/A;   EMBOLECTOMY  08/13/2018   Procedure: Right SFA and profunda femoris Fogarty embolectomy 3  Fogarty embolectomy balloon;  Surgeon: Annice Needy, MD;  Location: [facility] ORS;  Service: Vascular;;   ENDARTERECTOMY FEMORAL Right 08/13/2018   Procedure: Right common femoral, profunda femoris, and superficial femoral artery endarterectomies and patch angioplasty ;  Surgeon: Annice Needy, MD;   Location: [facility] ORS;  Service: Vascular;  Laterality: Right;   ENDOVASCULAR REPAIR/STENT GRAFT N/A 08/13/2018   Procedure: ENDOVASCULAR REPAIR/STENT GRAFT;  Surgeon: Renford Dills, MD;  Location: [facility] INVASIVE CV LAB;  Service: Cardiovascular;  Laterality: N/A;   LEFT HEART CATH AND CORONARY ANGIOGRAPHY Left 01/13/2019   Procedure: LEFT HEART CATH AND CORONARY ANGIOGRAPHY;  Surgeon: Antonieta Iba, MD;  Location: [facility] INVASIVE CV LAB;  Service: Cardiovascular;  Laterality: Left;   LOWER EXTREMITY ANGIOGRAPHY Right 09/08/2022   Procedure: Lower Extremity Angiography;  Surgeon: Annice Needy, MD;  Location: [facility] INVASIVE CV LAB;  Service: Cardiovascular;  Laterality: Right;   LOWER EXTREMITY ANGIOGRAPHY Right 09/15/2022   Procedure: Lower Extremity Angiography;  Surgeon: Annice Needy, MD;  Location: [facility] INVASIVE CV LAB;  Service: Cardiovascular;  Laterality: Right;   SALPINGOOPHORECTOMY Left 2000   Patient Active Problem List   Diagnosis Date Noted   Encounter for screening colonoscopy    Adenomatous polyp of colon    Acute gastroenteritis 04/26/2020   History of endovascular stent graft for abdominal aortic aneurysm 04/26/2020   Urticaria 04/26/2020   Diabetes (HCC) 07/05/2019   Precordial chest pain    Chest pain 04/09/2019   Atherosclerosis of native arteries of extremity with intermittent claudication (HCC) 04/01/2019   Coronary artery disease of native  artery of native heart with stable angina pectoris (HCC) 01/26/2019   CAD (coronary artery disease) 01/14/2019   Unstable angina (HCC) 01/13/2019   PAD (peripheral artery disease) (HCC) 08/30/2018   AAA (abdominal aortic aneurysm) (HCC) 08/10/2018   Chest pain with moderate risk for cardiac etiology 12/18/2017   Scotoma 12/18/2017   PVC (premature ventricular contraction) 02/12/2016   Essential hypertension, benign 02/12/2016   Tobacco use disorder 02/12/2016   Migraine headache 01/09/2016   Lymphocytosis 12/21/2015    Hypertension 12/12/2015   Mixed hyperlipidemia 12/12/2015   Vitamin D deficiency 12/12/2015   Mitral regurgitation 12/12/2015   Hypertensive urgency 11/29/2015   Abdominal pulsatile mass 11/29/2015    PCP: [facility]  REFERRING PROVIDER: Drake Leach, PA-C  REFERRING DIAG:  [phone] (ICD-10-CM) - Chronic bilateral low back pain with right-sided sciatica  M54.16 (ICD-10-CM) - Lumbar radiculopathy   Rationale for Evaluation and Treatment: Rehabilitation  THERAPY DIAGNOSIS:  Lumbar pain  Muscle weakness (generalized)  Difficulty in walking, not elsewhere classified  Pain in right leg  ONSET DATE: Years  SUBJECTIVE:                                                                                                                                                                                           SUBJECTIVE STATEMENT:  Pt reports being busy over the weekend leading to 5/10 NPS in her lower back. Denies radicular symptoms.    PERTINENT HISTORY:  Pt has PMH including: CAD, GERD, Heart Murmur, Hiatal hernia, hyperlipidemia, HTN, MI, neuropathy,  tobacco abuse  PAIN: Are you having pain? Yes: NPRS scale: 5/10 Pain location: Center of lower back Pain description: dull ache Aggravating factors: walking or standing for prolonged period time Relieving factors: sitting  PRECAUTIONS:  None, but has several stints  WEIGHT BEARING RESTRICTIONS: No  FALLS: Has patient fallen in last 6 months? No  LIVING ENVIRONMENT: Lives with: lives with their daughter Lives in: House/apartment Stairs: Yes: External: 5 steps; on right going up Has following equipment at home: None  OCCUPATION: On disability  PLOF: Independent  PATIENT GOALS: Relieve pain  NEXT MD VISIT: When finished with PT  OBJECTIVE:   DIAGNOSTIC FINDINGS:   EXAM: MRI CERVICAL SPINE WITHOUT CONTRAST  IMPRESSION: 1. Normal MRI appearance of the cervical spinal cord. No cord  signal changes to suggest myelopathy. 2. Right paracentral disc osteophyte complex at C4-5 with resultant mild canal and right C5 foraminal stenosis. 3. Disc bulging with uncovertebral spurring at C5-6 and C6-7 with resultant mild spinal stenosis.  PATIENT SURVEYS: FOTO 29/42  SCREENING FOR RED FLAGS: Bowel or bladder incontinence: No Spinal tumors: No  Cauda equina syndrome: No Compression fracture: No Abdominal aneurysm: Yes: but was repaired back in 2019.  COGNITION: Overall cognitive status: Within functional limits for tasks assessed     SENSATION: WFL   POSTURE: rounded shoulders, forward head, and posterior pelvic tilt  PALPATION:  Pt has tenderness to palpation along the L paraspinals in the lumbar region.  Pt also has muscle guarding when performing PAVM's in the lumbar spine.    LUMBAR ROM:  AROM eval  Flexion 100%  Extension 60%  Right lateral flexion 90%  Left lateral flexion 90%  Right rotation 95%  Left rotation 95%   (Blank rows = not tested)  LOWER EXTREMITY ROM:   Active  Right eval Left eval  Hip flexion    Hip extension    Hip abduction    Hip adduction    Hip internal rotation    Hip external rotation    Knee flexion    Knee extension    Ankle dorsiflexion    Ankle plantarflexion    Ankle inversion    Ankle eversion     (Blank rows = not tested)  LOWER EXTREMITY MMT:  MMT Right eval Left eval  Hip flexion 4 4  Hip extension    Hip abduction 4 4  Hip adduction 4 4  Hip internal rotation    Hip external rotation    Knee flexion 4 4  Knee extension 4+ 4+  Ankle dorsiflexion 4 5  Ankle plantarflexion    Ankle inversion    Ankle eversion     (Blank rows = not tested)  LUMBAR SPECIAL TESTS: Straight leg raise test: Negative, Slump test: Negative, and FABER test: Negative  FUNCTIONAL TESTS: 5 times sit to stand: 34.04 10 meter walk test: 0.89 m/s  GAIT: Distance walked: 20' Assistive device utilized: None Level of  assistance: SBA Comments: Pt with slowed gait pattern.  TODAY'S TREATMENT: DATE: 03/24/23   There.Ex:  NuStep, seat 8, arms 10, level 1 for increased mobility of the lumbar spine and LE's  Supine LTR, 2x10  Supine posterior pelvic tilt, 3 sec holds, 2x10  Supine B hamstring stretch, with strap, SLR, 30 sec bouts. > 90 degrees on LLE, 50 degrees on RLE.   Supine B figure 4 stretch, 30 sec bout/LE  Supine SKTC, 3 sec hold, x10 each LE  Hooklying bridging with verbal cues for glute squeeze prior to lift off, 2x10  Hooklying alt marches 2x20 with cuing initially for TA contraction with good carry over  Seated on theraball TA marching 2x15 with good carry over of demo for technique  Quadruped cat/camel: x15/side with min to mod multimodal cuing for form/technique and maintaining neutral pelvis.        PATIENT EDUCATION:  Education details: Pt educated on role of PT and services provided during current POC, along with prognosis and information about the clinic. Person educated: Patient Education method: Explanation Education comprehension: verbalized understanding  HOME EXERCISE PROGRAM: Access Code: [email or web address] URL: [email or web address] Date: 03/03/2023 Prepared by: Tomasa Hose  Exercises - Supine Lower Trunk Rotation  - 1 x daily - 7 x weekly - 3 sets - 10 reps - Supine Bridge  - 1 x daily - 7 x weekly - 3 sets - 10 reps - Supine Posterior Pelvic Tilt  - 1 x daily - 7 x weekly - 3 sets - 10 reps - Supine Hip Adduction Isometric with Ball  - 1 x daily - 7 x weekly - 3 sets - 10 reps -  Supine Piriformis Stretch with Foot on Ground  - 1 x daily - 7 x weekly - 3 sets - 10 reps  ASSESSMENT:  CLINICAL IMPRESSION: Pt arriving to PT with fair to good understanding of core and lumbar mobility exercises. Pt reports pain relief from 5/10 NPS at beginning to session to 2/10 NPS post session. Pt generally with excellent lumbar mobility but does continue to display  limitations in extension and significant R hamstring mobility limitations compared to LLE. PT educated on progressing POC to more standing therex in future sessions to improve standing and walking tolerances as this is where pt has greatest onset of back pain.  Pt will continue to benefit from skilled therapy to address remaining deficits in order to improve overall QoL and return to PLOF.       OBJECTIVE IMPAIRMENTS: Abnormal gait, decreased activity tolerance, decreased mobility, difficulty walking, decreased ROM, decreased strength, obesity, and pain.   ACTIVITY LIMITATIONS: lifting, bending, standing, and locomotion level  PARTICIPATION LIMITATIONS: meal prep, cleaning, laundry, community activity, and yard work  PERSONAL FACTORS: Age, Behavior pattern, Fitness, Past/current experiences, Profession, Sex, Time since onset of injury/illness/exacerbation, and 3+ comorbidities: DM2, heart murmur, Htn, MI, tobacco use  are also affecting patient's functional outcome.   REHAB POTENTIAL: Good  CLINICAL DECISION MAKING: Stable/uncomplicated  EVALUATION COMPLEXITY: Low   GOALS: Goals reviewed with patient? Yes  SHORT TERM GOALS: Target date: 03/31/2023  Pt will be independent with HEP in order to demonstrate increased ability to perform tasks related to occupation/hobbies. Baseline:  Pt given exercises to perform at home. Goal status: INITIAL   LONG TERM GOALS: Target date: 05/26/2023  1.  Patient (> 69 years old) will complete five times sit to stand test in < 15 seconds indicating an increased LE strength and improved balance. Baseline: 34.05 sec Goal status: INITIAL  2.  Patient will increase FOTO score to equal to or greater than 42 to demonstrate statistically significant improvement in mobility and quality of life.  Baseline: 29 Goal status: INITIAL   3.  Patient will reduce timed up and go to <11 seconds to reduce fall risk and demonstrate improved transfer/gait  ability. Baseline: Not performed at initial visit Goal status: INITIAL  4.  Patient will increase 10 meter walk test to >1.57m/s as to improve gait speed for better community ambulation and to reduce fall risk. Baseline: 0.89 m/s Goal status: INITIAL  5.  Patient will report 2/10 pain after standing in one location for >15 minutes in order simulate pt being able to do daily tasks such as washing dishes.   Baseline: 5/10 pain at rest Goal status: INITIAL    PLAN:  PT FREQUENCY: 2x/week  PT DURATION: 8 weeks  PLANNED INTERVENTIONS: Therapeutic exercises, Therapeutic activity, Neuromuscular re-education, Balance training, Gait training, Patient/Family education, Self Care, Joint mobilization, Joint manipulation, Stair training, Vestibular training, DME instructions, Aquatic Therapy, Dry Needling, Electrical stimulation, Spinal manipulation, Spinal mobilization, Cryotherapy, Moist heat, Traction, Manual therapy, and Re-evaluation.  PLAN FOR NEXT SESSION: Assess compliance with HEP and continue with lumbar mobility and core strengthening exercises.   Delphia Grates. Fairly IV, PT, DPT Physical Therapist- [place]  [facility]  03/24/23, 9:10 AM

## 2023-03-26 ENCOUNTER — Encounter: Payer: Medicaid Other | Admitting: Occupational Therapy

## 2023-03-31 ENCOUNTER — Ambulatory Visit: Payer: Medicaid Other | Admitting: Occupational Therapy

## 2023-03-31 ENCOUNTER — Ambulatory Visit: Payer: Medicaid Other

## 2023-03-31 DIAGNOSIS — M545 Low back pain, unspecified: Secondary | ICD-10-CM | POA: Diagnosis not present

## 2023-03-31 DIAGNOSIS — M6281 Muscle weakness (generalized): Secondary | ICD-10-CM

## 2023-03-31 DIAGNOSIS — M79604 Pain in right leg: Secondary | ICD-10-CM

## 2023-03-31 DIAGNOSIS — R262 Difficulty in walking, not elsewhere classified: Secondary | ICD-10-CM

## 2023-03-31 DIAGNOSIS — R600 Localized edema: Secondary | ICD-10-CM

## 2023-03-31 DIAGNOSIS — M25641 Stiffness of right hand, not elsewhere classified: Secondary | ICD-10-CM

## 2023-03-31 DIAGNOSIS — M79641 Pain in right hand: Secondary | ICD-10-CM

## 2023-03-31 NOTE — Therapy (Signed)
OUTPATIENT PHYSICAL THERAPY THORACOLUMBAR TREATMENT   Patient Name: Heather Mahoney MRN: [id number] DOB:[birth year], 57 y.o., female Today's Date: 03/31/2023  END OF SESSION:  PT End of Session - 03/31/23 0813     Visit Number 5    Number of Visits 8    Date for PT Re-Evaluation 04/28/23    PT Start Time 0815    PT Stop Time 0900    PT Time Calculation (min) 45 min    Activity Tolerance Patient tolerated treatment well    Behavior During Therapy [facility] for tasks assessed/performed             Past Medical History:  Diagnosis Date   CAD (coronary artery disease)    a. PCI to proximal and distal RCA 3/26   Diabetes mellitus without complication (HCC)    GERD (gastroesophageal reflux disease)    Heart murmur    MR   Hiatal hernia    Hot flashes    Hyperlipidemia    Hypertension    Myocardial infarction (HCC)    Neuropathy    Tobacco abuse    a. Started at age 37, quit during 3 pregnancies. b. 1 PPD for a 40 pack-year hx (2019)    Past Surgical History:  Procedure Laterality Date   ABDOMINAL AORTIC ANEURYSM REPAIR     2019   BREAST BIOPSY Right    Multiple biopsies-all benign   BREAST SURGERY  1986   I&D for 'milk duct'   COLONOSCOPY WITH PROPOFOL N/A 07/07/2022   Procedure: COLONOSCOPY WITH PROPOFOL;  Surgeon: Wyline Mood, MD;  Location: [facility] ENDOSCOPY;  Service: Gastroenterology;  Laterality: N/A;   CORONARY STENT INTERVENTION N/A 01/13/2019   Procedure: CORONARY STENT INTERVENTION;  Surgeon: Yvonne Kendall, MD;  Location: [facility] INVASIVE CV LAB;  Service: Cardiovascular;  Laterality: N/A;   EMBOLECTOMY  08/13/2018   Procedure: Right SFA and profunda femoris Fogarty embolectomy 3  Fogarty embolectomy balloon;  Surgeon: Annice Needy, MD;  Location: [facility] ORS;  Service: Vascular;;   ENDARTERECTOMY FEMORAL Right 08/13/2018   Procedure: Right common femoral, profunda femoris, and superficial femoral artery endarterectomies and patch angioplasty ;  Surgeon: Annice Needy,  MD;  Location: [facility] ORS;  Service: Vascular;  Laterality: Right;   ENDOVASCULAR REPAIR/STENT GRAFT N/A 08/13/2018   Procedure: ENDOVASCULAR REPAIR/STENT GRAFT;  Surgeon: Renford Dills, MD;  Location: [facility] INVASIVE CV LAB;  Service: Cardiovascular;  Laterality: N/A;   LEFT HEART CATH AND CORONARY ANGIOGRAPHY Left 01/13/2019   Procedure: LEFT HEART CATH AND CORONARY ANGIOGRAPHY;  Surgeon: Antonieta Iba, MD;  Location: [facility] INVASIVE CV LAB;  Service: Cardiovascular;  Laterality: Left;   LOWER EXTREMITY ANGIOGRAPHY Right 09/08/2022   Procedure: Lower Extremity Angiography;  Surgeon: Annice Needy, MD;  Location: [facility] INVASIVE CV LAB;  Service: Cardiovascular;  Laterality: Right;   LOWER EXTREMITY ANGIOGRAPHY Right 09/15/2022   Procedure: Lower Extremity Angiography;  Surgeon: Annice Needy, MD;  Location: [facility] INVASIVE CV LAB;  Service: Cardiovascular;  Laterality: Right;   SALPINGOOPHORECTOMY Left 2000   Patient Active Problem List   Diagnosis Date Noted   Encounter for screening colonoscopy    Adenomatous polyp of colon    Acute gastroenteritis 04/26/2020   History of endovascular stent graft for abdominal aortic aneurysm 04/26/2020   Urticaria 04/26/2020   Diabetes (HCC) 07/05/2019   Precordial chest pain    Chest pain 04/09/2019   Atherosclerosis of native arteries of extremity with intermittent claudication (HCC) 04/01/2019   Coronary artery disease of native  artery of native heart with stable angina pectoris (HCC) 01/26/2019   CAD (coronary artery disease) 01/14/2019   Unstable angina (HCC) 01/13/2019   PAD (peripheral artery disease) (HCC) 08/30/2018   AAA (abdominal aortic aneurysm) (HCC) 08/10/2018   Chest pain with moderate risk for cardiac etiology 12/18/2017   Scotoma 12/18/2017   PVC (premature ventricular contraction) 02/12/2016   Essential hypertension, benign 02/12/2016   Tobacco use disorder 02/12/2016   Migraine headache 01/09/2016   Lymphocytosis 12/21/2015    Hypertension 12/12/2015   Mixed hyperlipidemia 12/12/2015   Vitamin D deficiency 12/12/2015   Mitral regurgitation 12/12/2015   Hypertensive urgency 11/29/2015   Abdominal pulsatile mass 11/29/2015    PCP: [facility]  REFERRING PROVIDER: Drake Leach, PA-C  REFERRING DIAG:  [phone] (ICD-10-CM) - Chronic bilateral low back pain with right-sided sciatica  M54.16 (ICD-10-CM) - Lumbar radiculopathy   Rationale for Evaluation and Treatment: Rehabilitation  THERAPY DIAGNOSIS:  Lumbar pain  Muscle weakness (generalized)  Difficulty in walking, not elsewhere classified  Pain in right leg  ONSET DATE: Years  SUBJECTIVE:                                                                                                                                                                                           SUBJECTIVE STATEMENT: Pt reports being tired after being at the hospital with her grand son all night. 4/10 NPS in lower back today. Worst back pain has been in the last week is a 4/10 NPS. Pt trying to be more active reporting she is trying to walk more.    PERTINENT HISTORY:  Pt has PMH including: CAD, GERD, Heart Murmur, Hiatal hernia, hyperlipidemia, HTN, MI, neuropathy,  tobacco abuse  PAIN: Are you having pain? Yes: NPRS scale: 4/10 Pain location: Center of lower back Pain description: dull ache Aggravating factors: walking or standing for prolonged period time Relieving factors: sitting  PRECAUTIONS:  None, but has several stints  WEIGHT BEARING RESTRICTIONS: No  FALLS: Has patient fallen in last 6 months? No  LIVING ENVIRONMENT: Lives with: lives with their daughter Lives in: House/apartment Stairs: Yes: External: 5 steps; on right going up Has following equipment at home: None  OCCUPATION: On disability  PLOF: Independent  PATIENT GOALS: Relieve pain  NEXT MD VISIT: When finished with PT  OBJECTIVE:   DIAGNOSTIC  FINDINGS:   EXAM: MRI CERVICAL SPINE WITHOUT CONTRAST  IMPRESSION: 1. Normal MRI appearance of the cervical spinal cord. No cord signal changes to suggest myelopathy. 2. Right paracentral disc osteophyte complex at C4-5 with resultant mild canal and right C5 foraminal stenosis. 3. Disc bulging with uncovertebral  spurring at C5-6 and C6-7 with resultant mild spinal stenosis.  PATIENT SURVEYS: FOTO 29/42  SCREENING FOR RED FLAGS: Bowel or bladder incontinence: No Spinal tumors: No Cauda equina syndrome: No Compression fracture: No Abdominal aneurysm: Yes: but was repaired back in 2019.  COGNITION: Overall cognitive status: Within functional limits for tasks assessed     SENSATION: WFL   POSTURE: rounded shoulders, forward head, and posterior pelvic tilt  PALPATION:  Pt has tenderness to palpation along the L paraspinals in the lumbar region.  Pt also has muscle guarding when performing PAVM's in the lumbar spine.    LUMBAR ROM:  AROM eval  Flexion 100%  Extension 60%  Right lateral flexion 90%  Left lateral flexion 90%  Right rotation 95%  Left rotation 95%   (Blank rows = not tested)  LOWER EXTREMITY ROM:   Active  Right eval Left eval  Hip flexion    Hip extension    Hip abduction    Hip adduction    Hip internal rotation    Hip external rotation    Knee flexion    Knee extension    Ankle dorsiflexion    Ankle plantarflexion    Ankle inversion    Ankle eversion     (Blank rows = not tested)  LOWER EXTREMITY MMT:  MMT Right eval Left eval  Hip flexion 4 4  Hip extension    Hip abduction 4 4  Hip adduction 4 4  Hip internal rotation    Hip external rotation    Knee flexion 4 4  Knee extension 4+ 4+  Ankle dorsiflexion 4 5  Ankle plantarflexion    Ankle inversion    Ankle eversion     (Blank rows = not tested)  LUMBAR SPECIAL TESTS: Straight leg raise test: Negative, Slump test: Negative, and FABER test: Negative  FUNCTIONAL  TESTS: 5 times sit to stand: 34.04 10 meter walk test: 0.89 m/s  GAIT: Distance walked: 20' Assistive device utilized: None Level of assistance: SBA Comments: Pt with slowed gait pattern.  TODAY'S TREATMENT: DATE: 03/31/23   There.Ex:  NuStep, seat 8, arms 10, level 3 for increased mobility of the lumbar spine and LE's  Supine LTR, 2x10  Figure 4 stretch: x30 sec/LE  Quadruped cat/cow: x20  Side lying open books: x15/side  Hooklying bridging with verbal cues for glute squeeze prior to lift off, 2x10   Standing exercises:   3 way hip with 4# AW's: 2x10/side. Requiring mod to max multimodal cuing for all planes as pt has very difficult time with motor control throughout.    Blue TB paloff press: 2x10/side      PATIENT EDUCATION:  Education details: Pt educated on role of PT and services provided during current POC, along with prognosis and information about the clinic. Person educated: Patient Education method: Explanation Education comprehension: verbalized understanding  HOME EXERCISE PROGRAM: Access Code: [email or web address] URL: [email or web address] Date: 03/31/2023 Prepared by: Ronnie Derby  Exercises - Supine Lower Trunk Rotation  - 1 x daily - 7 x weekly - 3 sets - 10 reps - Supine Bridge  - 1 x daily - 7 x weekly - 3 sets - 10 reps - Supine Posterior Pelvic Tilt  - 1 x daily - 7 x weekly - 3 sets - 10 reps - Supine Hip Adduction Isometric with Ball  - 1 x daily - 7 x weekly - 3 sets - 10 reps - Supine Piriformis Stretch with Foot on Ground  - 1 x daily -  7 x weekly - 3 sets - 10 reps - Cat Cow  - 1 x daily - 7 x weekly - 2 sets - 112 reps - Sidelying Open Book Thoracic Lumbar Rotation and Extension  - 1 x daily - 7 x weekly - 2 sets - 12 reps   Access Code: [email or web address] URL: [email or web address] Date: 03/03/2023 Prepared by: Tomasa Hose  Exercises - Supine Lower Trunk Rotation  - 1 x daily - 7 x weekly - 3 sets - 10 reps - Supine  Bridge  - 1 x daily - 7 x weekly - 3 sets - 10 reps - Supine Posterior Pelvic Tilt  - 1 x daily - 7 x weekly - 3 sets - 10 reps - Supine Hip Adduction Isometric with Ball  - 1 x daily - 7 x weekly - 3 sets - 10 reps - Supine Piriformis Stretch with Foot on Ground  - 1 x daily - 7 x weekly - 3 sets - 10 reps  ASSESSMENT:  CLINICAL IMPRESSION: Pt reporting to PT with excellent motivation. Pt with good carryover in improved pain levels since last week reporting 4/10 NPS from beginning of session to 2/10 NPS post session. Pt remains with deficits in thoracic hypomobility and limited AROM with limited core stability and motor control with standing therex exercises likely leading to worsening LBP with standing and walking ADL's outside of PT. Educated pt on correcting thoracic mobility with HEP so PT time can focus on standing therex to improve core and hip stability. Pt understanding.  Pt will continue to benefit from skilled therapy to address remaining deficits in order to improve overall QoL and return to PLOF.   OBJECTIVE IMPAIRMENTS: Abnormal gait, decreased activity tolerance, decreased mobility, difficulty walking, decreased ROM, decreased strength, obesity, and pain.   ACTIVITY LIMITATIONS: lifting, bending, standing, and locomotion level  PARTICIPATION LIMITATIONS: meal prep, cleaning, laundry, community activity, and yard work  PERSONAL FACTORS: Age, Behavior pattern, Fitness, Past/current experiences, Profession, Sex, Time since onset of injury/illness/exacerbation, and 3+ comorbidities: DM2, heart murmur, Htn, MI, tobacco use  are also affecting patient's functional outcome.   REHAB POTENTIAL: Good  CLINICAL DECISION MAKING: Stable/uncomplicated  EVALUATION COMPLEXITY: Low   GOALS: Goals reviewed with patient? Yes  SHORT TERM GOALS: Target date: 03/31/2023  Pt will be independent with HEP in order to demonstrate increased ability to perform tasks related to  occupation/hobbies. Baseline:  Pt given exercises to perform at home. Goal status: INITIAL   LONG TERM GOALS: Target date: 05/26/2023  1.  Patient (> 68 years old) will complete five times sit to stand test in < 15 seconds indicating an increased LE strength and improved balance. Baseline: 34.05 sec Goal status: INITIAL  2.  Patient will increase FOTO score to equal to or greater than 42 to demonstrate statistically significant improvement in mobility and quality of life.  Baseline: 29 Goal status: INITIAL   3.  Patient will reduce timed up and go to <11 seconds to reduce fall risk and demonstrate improved transfer/gait ability. Baseline: Not performed at initial visit Goal status: INITIAL  4.  Patient will increase 10 meter walk test to >1.57m/s as to improve gait speed for better community ambulation and to reduce fall risk. Baseline: 0.89 m/s Goal status: INITIAL  5.  Patient will report 2/10 pain after standing in one location for >15 minutes in order simulate pt being able to do daily tasks such as washing dishes.   Baseline: 5/10 pain at rest Goal status: INITIAL  PLAN:  PT FREQUENCY: 2x/week  PT DURATION: 8 weeks  PLANNED INTERVENTIONS: Therapeutic exercises, Therapeutic activity, Neuromuscular re-education, Balance training, Gait training, Patient/Family education, Self Care, Joint mobilization, Joint manipulation, Stair training, Vestibular training, DME instructions, Aquatic Therapy, Dry Needling, Electrical stimulation, Spinal manipulation, Spinal mobilization, Cryotherapy, Moist heat, Traction, Manual therapy, and Re-evaluation.  PLAN FOR NEXT SESSION: standing hip and core stability exercise.    Delphia Grates. Fairly IV, PT, DPT Physical Therapist- [place]  [facility]  03/31/23, 9:55 AM

## 2023-03-31 NOTE — Therapy (Signed)
[facility] [address] [place], [address] Phone: [phone]   Fax:  [phone]  Occupational Therapy Treatment  Patient Details  Name: Heather Mahoney MRN: [id number] Date of Birth: [birth year] Referring Provider (OT): [place]   Encounter Date: 03/31/2023   OT End of Session - 03/31/23 0929     Visit Number 4    Number of Visits 6    Date for OT Re-Evaluation 04/14/23    OT Start Time 0906    OT Stop Time 0941    OT Time Calculation (min) 35 min    Activity Tolerance Patient tolerated treatment well    Behavior During Therapy [facility] for tasks assessed/performed             Past Medical History:  Diagnosis Date   CAD (coronary artery disease)    a. PCI to proximal and distal RCA 3/26   Diabetes mellitus without complication (HCC)    GERD (gastroesophageal reflux disease)    Heart murmur    MR   Hiatal hernia    Hot flashes    Hyperlipidemia    Hypertension    Myocardial infarction (HCC)    Neuropathy    Tobacco abuse    a. Started at age 37, quit during 3 pregnancies. b. 1 PPD for a 40 pack-year hx (2019)     Past Surgical History:  Procedure Laterality Date   ABDOMINAL AORTIC ANEURYSM REPAIR     2019   BREAST BIOPSY Right    Multiple biopsies-all benign   BREAST SURGERY  1986   I&D for 'milk duct'   COLONOSCOPY WITH PROPOFOL N/A 07/07/2022   Procedure: COLONOSCOPY WITH PROPOFOL;  Surgeon: Wyline Mood, MD;  Location: [facility] ENDOSCOPY;  Service: Gastroenterology;  Laterality: N/A;   CORONARY STENT INTERVENTION N/A 01/13/2019   Procedure: CORONARY STENT INTERVENTION;  Surgeon: Yvonne Kendall, MD;  Location: [facility] INVASIVE CV LAB;  Service: Cardiovascular;  Laterality: N/A;   EMBOLECTOMY  08/13/2018   Procedure: Right SFA and profunda femoris Fogarty embolectomy 3  Fogarty embolectomy balloon;  Surgeon: Annice Needy, MD;  Location: [facility] ORS;  Service: Vascular;;   ENDARTERECTOMY FEMORAL Right  08/13/2018   Procedure: Right common femoral, profunda femoris, and superficial femoral artery endarterectomies and patch angioplasty ;  Surgeon: Annice Needy, MD;  Location: [facility] ORS;  Service: Vascular;  Laterality: Right;   ENDOVASCULAR REPAIR/STENT GRAFT N/A 08/13/2018   Procedure: ENDOVASCULAR REPAIR/STENT GRAFT;  Surgeon: Renford Dills, MD;  Location: [facility] INVASIVE CV LAB;  Service: Cardiovascular;  Laterality: N/A;   LEFT HEART CATH AND CORONARY ANGIOGRAPHY Left 01/13/2019   Procedure: LEFT HEART CATH AND CORONARY ANGIOGRAPHY;  Surgeon: Antonieta Iba, MD;  Location: [facility] INVASIVE CV LAB;  Service: Cardiovascular;  Laterality: Left;   LOWER EXTREMITY ANGIOGRAPHY Right 09/08/2022   Procedure: Lower Extremity Angiography;  Surgeon: Annice Needy, MD;  Location: [facility] INVASIVE CV LAB;  Service: Cardiovascular;  Laterality: Right;   LOWER EXTREMITY ANGIOGRAPHY Right 09/15/2022   Procedure: Lower Extremity Angiography;  Surgeon: Annice Needy, MD;  Location: [facility] INVASIVE CV LAB;  Service: Cardiovascular;  Laterality: Right;   SALPINGOOPHORECTOMY Left 2000    There were no vitals filed for this visit.   Subjective Assessment - 03/31/23 0927     Subjective  I love the glove - feels good- my foreram hurt after doing the pulling weight in PT - middle finger still stiff - massage constantly but not the motion  Pertinent History Patient had 12/03/2022 right third digit trigger finger release done by Dr. Rosita Kea.  Patient reports she had this for 10 years prior.  Patient referred to OT for continued stiffness and pain in right third digit.    Patient Stated Goals I want to bend the middle finger and be able to make a fist to use it at home with cooking and sewing and playing with her 58-year-old grandchild    Currently in Pain? Yes    Pain Score 2     Pain Location Hand    Pain Orientation Right    Pain Descriptors / Indicators Aching    Pain Type Surgical pain                [facility] OT  Assessment - 03/31/23 0001       Strength   Right Hand Grip (lbs) 50    Right Hand Lateral Pinch 18 lbs    Right Hand 3 Point Pinch 12 lbs    Left Hand Grip (lbs) 36    Left Hand Lateral Pinch 18 lbs    Left Hand 3 Point Pinch 11 lbs      Right Hand AROM   R Long  MCP 0-90 90 Degrees    R Long PIP 0-100 80 Degrees    R Long DIP 0-70 55 Degrees                      OT Treatments/Exercises (OP) - 03/31/23 0001       RUE Fluidotherapy   Number Minutes Fluidotherapy 8 Minutes    RUE Fluidotherapy Location Hand    Comments prior to ROM and decrease stiffness             Patient to use some moist heat or contrast prior to soft tissue massage  and using red roller for extension 20 reps pain-free   Followed by tendon glides for MC flexion  with PIP extension 10 reps Passive range of motion for intrinsic fist done  Used DIP/PIP strap in past - but pt report falling asleep with it at times  Should  use 2 to 3 minutes prior to active range of motion intrinsic fist 10 reps each Followed by composite fist -passive range of motion in a pain-free range 10 reps Then place and hold composite fist - increase to PIP 90 and DIP 65 Patient to enlarge grips in the meantime to decrease pain in 3rd digit and palm- less pain this date in palm Also to use larger joints like palms or forearm to carry or lift      Patient to wear compression silicone sleeve on third digit  day as needed and nighttime to decrease edema and PIP  REINFORCE FOR Pt TO DO 5 x day - PROM INTRINSIC FIST _ BLOCK INTRINSIC FIST_ PROM COMPOSITE FLEXION  PLACE AND HOLD  10 reps        OT Education - 03/31/23 0929     Education Details progress and HEP    Person(s) Educated Patient    Methods Explanation;Demonstration;Tactile cues;Verbal cues    Comprehension Verbal cues required;Returned demonstration;Verbalized understanding                 OT Long Term Goals - 03/03/23 1254       OT  LONG TERM GOAL #1   Title Patient be independent in home program to decrease edema and pain and increase range of motion of third digit to touch  palm without increased symptoms    Baseline Edema in the third digit as well as pain 4/10 with active range of motion and on the dorsal 3rd digit as well as palm    Time 3    Period Weeks    Status New    Target Date 03/24/23      OT LONG TERM GOAL #2   Title Third digit active range of motion increased to within normal limits for patient to hold knife, toothbrush and broom without increase symptoms    Baseline PIP flexion 75 degrees and DIP 40.  Pain with flexion 4/10    Time 4    Period Weeks    Status New    Target Date 03/31/23      OT LONG TERM GOAL #3   Title Right grip and prehension strength increased to within normal limits for her age without increase symptoms for patient to hold broom this left pots and pain and apply with child    Baseline Grip 44 and lateral pinch 14.  3-point pinch 7 pounds on the right with discomfort, left 9 pounds    Time 6    Period Weeks    Status New    Target Date 04/14/23                   Plan - 03/31/23 0929     Clinical Impression Statement Patient presented OT evaluation with a diagnosis of right third digit trigger finger release on 11/25/2022 by Dr. Rosita Kea.  Patient report she had trigger finger for about 10 years.  Patient do have arthritis with stiffness in the hands as well as history of being diabetic.  Patient scar tissue doing very well.  Patient with decreased PIP extension of the third -10,  At eval active flexion  was 75 at PIP and DIP 40.  Pt making progress but cont to have stiffness at PIP and DIP flexion with intrinsic and composite flexion. Reinforce with pt to do about 5 x during day PROM for intrinsic and composite to decrease stiffness to be able to increase AROM and use it functionally .  Patient with increased pain with decreased use of right dominant hand in ADLs and IADLs.   Patient can benefit from OT services to decrease pain and edema and wrist motion and length for patient to return to prior level of function.    OT Occupational Profile and History Problem Focused Assessment - Including review of records relating to presenting problem    Occupational performance deficits (Please refer to evaluation for details): ADL's;IADL's;Play;Social Participation;Leisure    Body Structure / Function / Physical Skills ADL;Decreased knowledge of use of DME;Flexibility;ROM;UE functional use;Dexterity;Edema;Pain;Strength;IADL;Sensation    Rehab Potential Fair    Clinical Decision Making Limited treatment options, no task modification necessary    Comorbidities Affecting Occupational Performance: May have comorbidities impacting occupational performance    Modification or Assistance to Complete Evaluation  No modification of tasks or assist necessary to complete eval    OT Frequency 1x / week    OT Duration 2 weeks    OT Treatment/Interventions Self-care/ADL training;Ultrasound;Moist Heat;Paraffin;Fluidtherapy;Contrast Bath;DME and/or AE instruction;Manual Therapy;Passive range of motion;Scar mobilization;Splinting;Patient/family education;Therapeutic exercise    Consulted and Agree with Plan of Care Patient             Patient will benefit from skilled therapeutic intervention in order to improve the following deficits and impairments:   Body Structure / Function / Physical Skills: ADL, Decreased knowledge of  use of DME, Flexibility, ROM, UE functional use, Dexterity, Edema, Pain, Strength, IADL, Sensation       Visit Diagnosis: Pain in right hand  Localized edema  Stiffness of right hand, not elsewhere classified    Problem List Patient Active Problem List   Diagnosis Date Noted   Encounter for screening colonoscopy    Adenomatous polyp of colon    Acute gastroenteritis 04/26/2020   History of endovascular stent graft for abdominal aortic aneurysm 04/26/2020    Urticaria 04/26/2020   Diabetes (HCC) 07/05/2019   Precordial chest pain    Chest pain 04/09/2019   Atherosclerosis of native arteries of extremity with intermittent claudication (HCC) 04/01/2019   Coronary artery disease of native artery of native heart with stable angina pectoris (HCC) 01/26/2019   CAD (coronary artery disease) 01/14/2019   Unstable angina (HCC) 01/13/2019   PAD (peripheral artery disease) (HCC) 08/30/2018   AAA (abdominal aortic aneurysm) (HCC) 08/10/2018   Chest pain with moderate risk for cardiac etiology 12/18/2017   Scotoma 12/18/2017   PVC (premature ventricular contraction) 02/12/2016   Essential hypertension, benign 02/12/2016   Tobacco use disorder 02/12/2016   Migraine headache 01/09/2016   Lymphocytosis 12/21/2015   Hypertension 12/12/2015   Mixed hyperlipidemia 12/12/2015   Vitamin D deficiency 12/12/2015   Mitral regurgitation 12/12/2015   Hypertensive urgency 11/29/2015   Abdominal pulsatile mass 11/29/2015    Oletta Cohn, OTR/L,CLT 03/31/2023, 9:42 AM  [facility] [address], [place], [address] Phone: [phone]   Fax:  [phone]  Name: Tamecia TONYA CORGAN MRN: [id number] Date of Birth: 1966-03-20

## 2023-04-03 ENCOUNTER — Emergency Department: Payer: Medicaid Other

## 2023-04-03 ENCOUNTER — Emergency Department
Admission: EM | Admit: 2023-04-03 | Discharge: 2023-04-04 | Disposition: A | Discharge status: Home / Self Care | Payer: Medicaid Other | Attending: Student in an Organized Health Care Education/Training Program | Admitting: Student in an Organized Health Care Education/Training Program

## 2023-04-03 ENCOUNTER — Other Ambulatory Visit: Payer: Self-pay

## 2023-04-03 DIAGNOSIS — R072 Precordial pain: Secondary | ICD-10-CM | POA: Diagnosis present

## 2023-04-03 DIAGNOSIS — I159 Secondary hypertension, unspecified: Secondary | ICD-10-CM | POA: Diagnosis not present

## 2023-04-03 DIAGNOSIS — R0789 Other chest pain: Secondary | ICD-10-CM

## 2023-04-03 LAB — CBC
HCT: 35.8 % — ABNORMAL LOW (ref 36.0–46.0)
Hemoglobin: 11.5 g/dL — ABNORMAL LOW (ref 12.0–15.0)
MCH: 26.7 pg (ref 26.0–34.0)
MCHC: 32.1 g/dL (ref 30.0–36.0)
MCV: 83.3 fL (ref 80.0–100.0)
Platelets: 438 10*3/uL — ABNORMAL HIGH (ref 150–400)
RBC: 4.3 MIL/uL (ref 3.87–5.11)
RDW: 16.9 % — ABNORMAL HIGH (ref 11.5–15.5)
WBC: 13.7 10*3/uL — ABNORMAL HIGH (ref 4.0–10.5)
nRBC: 0 % (ref 0.0–0.2)

## 2023-04-03 LAB — BASIC METABOLIC PANEL
Anion gap: 11 (ref 5–15)
BUN: 14 mg/dL (ref 6–20)
CO2: 21 mmol/L — ABNORMAL LOW (ref 22–32)
Calcium: 8.9 mg/dL (ref 8.9–10.3)
Chloride: 104 mmol/L (ref 98–111)
Creatinine, Ser: 0.61 mg/dL (ref 0.44–1.00)
GFR, Estimated: >60 mL/min (ref >60)
Glucose, Bld: 93 mg/dL (ref 70–99)
Potassium: 3.6 mmol/L (ref 3.5–5.1)
Sodium: 136 mmol/L (ref 135–145)

## 2023-04-03 LAB — TROPONIN I (HIGH SENSITIVITY)
Troponin I (High Sensitivity): 8 ng/L (ref <18)
Troponin I (High Sensitivity): 7 ng/L (ref <18)

## 2023-04-03 MED ORDER — CLONIDINE HCL 0.1 MG PO TABS
0.2000 mg | ORAL_TABLET | Freq: Once | ORAL | Status: AC
  Administered 2023-04-03: 0.2 mg via ORAL
  Filled 2023-04-03: qty 2

## 2023-04-03 MED ORDER — MORPHINE SULFATE (PF) 4 MG/ML IV SOLN
4.0000 mg | INTRAVENOUS | Status: DC | PRN
  Administered 2023-04-03: 4 mg via INTRAVENOUS
  Filled 2023-04-03: qty 1

## 2023-04-03 MED ORDER — ONDANSETRON HCL 4 MG/2ML IJ SOLN
4.0000 mg | Freq: Once | INTRAMUSCULAR | Status: AC
  Administered 2023-04-03: 4 mg via INTRAVENOUS
  Filled 2023-04-03: qty 2

## 2023-04-03 MED ORDER — IOHEXOL 350 MG/ML SOLN
75.0000 mL | Freq: Once | INTRAVENOUS | Status: AC | PRN
  Administered 2023-04-03: 75 mL via INTRAVENOUS

## 2023-04-03 NOTE — ED Notes (Signed)
Blue top sent down. Pt on thinners.

## 2023-04-03 NOTE — ED Provider Notes (Signed)
[facility] Provider Note    Event Date/Time   First MD Initiated Contact with Patient 04/03/23 2126     (approximate)   History   Chest Pain (Since AM)   HPI  Heather Mahoney is a 57 y.o. female midsternal chest pain discomfort radiating to left arm started this morning.  No associated diaphoresis.  No significant aggravating or alleviating factors.  No recent fevers.  She does take Plavix.  Denies any particular stressors.  No abdominal pain.  Does have some discomfort with taking deep inspiration and some shortness of breath but has not felt any wheezing no cough.  No nausea or vomiting.     Physical Exam   Triage Vital Signs: ED Triage Vitals  Enc Vitals Group     BP 04/03/23 1937 (S) (!) 177/108     Pulse Rate 04/03/23 1934 78     Resp 04/03/23 1934 16     Temp 04/03/23 1934 98.4 F (36.9 C)     Temp src --      SpO2 04/03/23 1934 100 %     Weight 04/03/23 1935 168 lb (76.2 kg)     Height 04/03/23 1935 5\' 6"  (1.676 m)     Head Circumference --      Peak Flow --      Pain Score 04/03/23 1935 8     Pain Loc --      Pain Edu? --      Excl. in GC? --     Most recent vital signs: Vitals:   04/03/23 2130 04/03/23 2312  BP: (!) 181/110 110/76  Pulse: 75 61  Resp: (!) 21 18  Temp:    SpO2: 97% 100%     Constitutional: Alert  Eyes: Conjunctivae are normal.  Head: Atraumatic. Nose: No congestion/rhinnorhea. Mouth/Throat: Mucous membranes are moist.   Neck: Painless ROM.  Cardiovascular:   Good peripheral circulation. Respiratory: Normal respiratory effort.  No retractions.  Gastrointestinal: Soft and nontender.  Musculoskeletal:  no deformity Neurologic:  MAE spontaneously. No gross focal neurologic deficits are appreciated.  Skin:  Skin is warm, dry and intact. No rash noted. Psychiatric: Mood and affect are normal. Speech and behavior are normal.    ED Results / Procedures / Treatments   Labs (all labs ordered are listed,  but only abnormal results are displayed) Labs Reviewed  BASIC METABOLIC PANEL - Abnormal; Notable for the following components:      Result Value   CO2 21 (*)    All other components within normal limits  CBC - Abnormal; Notable for the following components:   WBC 13.7 (*)    Hemoglobin 11.5 (*)    HCT 35.8 (*)    RDW 16.9 (*)    Platelets 438 (*)    All other components within normal limits  TROPONIN I (HIGH SENSITIVITY)  TROPONIN I (HIGH SENSITIVITY)     EKG  ED ECG REPORT I, Willy Eddy, the attending physician, personally viewed and interpreted this ECG.   Date: 04/03/2023  EKG Time: 19:35  Rate: 70  Rhythm: sinus  Axis: normal  Intervals: normal qt  ST&T Change: no stemi, nonspecific st abn    RADIOLOGY Please see ED Course for my review and interpretation.  I personally reviewed all radiographic images ordered to evaluate for the above acute complaints and reviewed radiology reports and findings.  These findings were personally discussed with the patient.  Please see medical record for radiology report.  PROCEDURES:  Critical Care performed: No  Procedures   MEDICATIONS ORDERED IN ED: Medications  morphine (PF) 4 MG/ML injection 4 mg (4 mg Intravenous Given 04/03/23 2159)  ondansetron (ZOFRAN) injection 4 mg (4 mg Intravenous Given 04/03/23 2158)  cloNIDine (CATAPRES) tablet 0.2 mg (0.2 mg Oral Given 04/03/23 2158)  iohexol (OMNIPAQUE) 350 MG/ML injection 75 mL (75 mLs Intravenous Contrast Given 04/03/23 2215)     IMPRESSION / MDM / ASSESSMENT AND PLAN / ED COURSE  I reviewed the triage vital signs and the nursing notes.                              Differential diagnosis includes, but is not limited to, ACS, pericarditis, esophagitis, boerhaaves, pe, dissection, pna, bronchitis, costochondritis  Patient presenting to the ER for evaluation of symptoms as described above.  Based on symptoms, risk factors and considered above differential, this  presenting complaint could reflect a potentially life-threatening illness therefore the patient will be placed on continuous pulse oximetry and telemetry for monitoring.  Laboratory evaluation will be sent to evaluate for the above complaints.  Will order CTA given concern for possible PE given her pleuritic description some chest discomfort.  I do not appreciate any significant wheezing on exam.  EKG is nonischemic initial troponin is negative.  Abdominal exam is soft and benign.  Have a lower suspicion for dissection.   Clinical Course as of 04/03/23 2345  Fri Apr 03, 2023  2307 CTA my review and interpretation without evidence for aneurysm or dissection or PE. [PR]  2328 Serial troponins are negative.  CTA without evidence of acute findings.  Blood pressure improved on her medication.  At this point workup is reassuring I think she is appropriate for outpatient follow-up she is currently pain-free. [PR]    Clinical Course User Index [PR] Willy Eddy, MD     FINAL CLINICAL IMPRESSION(S) / ED DIAGNOSES   Final diagnoses:  Atypical chest pain  Secondary hypertension     Rx / DC Orders   ED Discharge Orders     None        Note:  This document was prepared using Dragon voice recognition software and may include unintentional dictation errors.    Willy Eddy, MD 04/03/23 [phone]

## 2023-04-03 NOTE — ED Triage Notes (Signed)
Pt to ed from home via POV for CP that started this morning. Pt is CAOx4, in no acute distress in triage. Pt advised "I have always had chest pain here and there".

## 2023-04-07 ENCOUNTER — Ambulatory Visit: Payer: Medicaid Other

## 2023-04-07 ENCOUNTER — Telehealth: Payer: Self-pay

## 2023-04-07 NOTE — Telephone Encounter (Signed)
Author called patient about missed 8:15 a.m. PT appointment. Left VM on next scheduled visit.

## 2023-04-14 ENCOUNTER — Ambulatory Visit: Payer: Medicaid Other | Admitting: Occupational Therapy

## 2023-04-14 ENCOUNTER — Ambulatory Visit: Payer: Medicaid Other

## 2023-04-21 ENCOUNTER — Ambulatory Visit: Payer: Medicaid Other | Attending: Orthopedic Surgery

## 2023-04-21 DIAGNOSIS — M79641 Pain in right hand: Secondary | ICD-10-CM | POA: Diagnosis present

## 2023-04-21 DIAGNOSIS — M545 Low back pain, unspecified: Secondary | ICD-10-CM | POA: Diagnosis present

## 2023-04-21 DIAGNOSIS — M25641 Stiffness of right hand, not elsewhere classified: Secondary | ICD-10-CM | POA: Diagnosis present

## 2023-04-21 DIAGNOSIS — M6281 Muscle weakness (generalized): Secondary | ICD-10-CM | POA: Insufficient documentation

## 2023-04-21 NOTE — Therapy (Signed)
OUTPATIENT PHYSICAL THERAPY THORACOLUMBAR TREATMENT   Patient Name: Heather Mahoney MRN: [id number] DOB:06/10/1966, 57 y.o., female Today's Date: 04/21/2023  END OF SESSION:  PT End of Session - 04/21/23 0808     Visit Number 6    Number of Visits 8    Date for PT Re-Evaluation 04/28/23    PT Start Time 0814    PT Stop Time 0858    PT Time Calculation (min) 44 min    Activity Tolerance Patient tolerated treatment well    Behavior During Therapy [facility] for tasks assessed/performed             Past Medical History:  Diagnosis Date   CAD (coronary artery disease)    a. PCI to proximal and distal RCA 3/26   Diabetes mellitus without complication (HCC)    GERD (gastroesophageal reflux disease)    Heart murmur    MR   Hiatal hernia    Hot flashes    Hyperlipidemia    Hypertension    Myocardial infarction (HCC)    Neuropathy    Tobacco abuse    a. Started at age 53, quit during 3 pregnancies. b. 1 PPD for a 40 pack-year hx (2019)    Past Surgical History:  Procedure Laterality Date   ABDOMINAL AORTIC ANEURYSM REPAIR     2019   BREAST BIOPSY Right    Multiple biopsies-all benign   BREAST SURGERY  1986   I&D for 'milk duct'   COLONOSCOPY WITH PROPOFOL N/A 07/07/2022   Procedure: COLONOSCOPY WITH PROPOFOL;  Surgeon: Wyline Mood, MD;  Location: [facility] ENDOSCOPY;  Service: Gastroenterology;  Laterality: N/A;   CORONARY STENT INTERVENTION N/A 01/13/2019   Procedure: CORONARY STENT INTERVENTION;  Surgeon: Yvonne Kendall, MD;  Location: [facility] INVASIVE CV LAB;  Service: Cardiovascular;  Laterality: N/A;   EMBOLECTOMY  08/13/2018   Procedure: Right SFA and profunda femoris Fogarty embolectomy 3  Fogarty embolectomy balloon;  Surgeon: Annice Needy, MD;  Location: [facility] ORS;  Service: Vascular;;   ENDARTERECTOMY FEMORAL Right 08/13/2018   Procedure: Right common femoral, profunda femoris, and superficial femoral artery endarterectomies and patch angioplasty ;  Surgeon: Annice Needy, MD;   Location: [facility] ORS;  Service: Vascular;  Laterality: Right;   ENDOVASCULAR REPAIR/STENT GRAFT N/A 08/13/2018   Procedure: ENDOVASCULAR REPAIR/STENT GRAFT;  Surgeon: Renford Dills, MD;  Location: [facility] INVASIVE CV LAB;  Service: Cardiovascular;  Laterality: N/A;   LEFT HEART CATH AND CORONARY ANGIOGRAPHY Left 01/13/2019   Procedure: LEFT HEART CATH AND CORONARY ANGIOGRAPHY;  Surgeon: Antonieta Iba, MD;  Location: [facility] INVASIVE CV LAB;  Service: Cardiovascular;  Laterality: Left;   LOWER EXTREMITY ANGIOGRAPHY Right 09/08/2022   Procedure: Lower Extremity Angiography;  Surgeon: Annice Needy, MD;  Location: [facility] INVASIVE CV LAB;  Service: Cardiovascular;  Laterality: Right;   LOWER EXTREMITY ANGIOGRAPHY Right 09/15/2022   Procedure: Lower Extremity Angiography;  Surgeon: Annice Needy, MD;  Location: [facility] INVASIVE CV LAB;  Service: Cardiovascular;  Laterality: Right;   SALPINGOOPHORECTOMY Left 2000   Patient Active Problem List   Diagnosis Date Noted   Encounter for screening colonoscopy    Adenomatous polyp of colon    Acute gastroenteritis 04/26/2020   History of endovascular stent graft for abdominal aortic aneurysm 04/26/2020   Urticaria 04/26/2020   Diabetes (HCC) 07/05/2019   Precordial chest pain    Chest pain 04/09/2019   Atherosclerosis of native arteries of extremity with intermittent claudication (HCC) 04/01/2019   Coronary artery disease of native  artery of native heart with stable angina pectoris (HCC) 01/26/2019   CAD (coronary artery disease) 01/14/2019   Unstable angina (HCC) 01/13/2019   PAD (peripheral artery disease) (HCC) 08/30/2018   AAA (abdominal aortic aneurysm) (HCC) 08/10/2018   Chest pain with moderate risk for cardiac etiology 12/18/2017   Scotoma 12/18/2017   PVC (premature ventricular contraction) 02/12/2016   Essential hypertension, benign 02/12/2016   Tobacco use disorder 02/12/2016   Migraine headache 01/09/2016   Lymphocytosis 12/21/2015    Hypertension 12/12/2015   Mixed hyperlipidemia 12/12/2015   Vitamin D deficiency 12/12/2015   Mitral regurgitation 12/12/2015   Hypertensive urgency 11/29/2015   Abdominal pulsatile mass 11/29/2015    PCP: [facility]  REFERRING PROVIDER: Drake Leach, PA-C  REFERRING DIAG:  [phone] (ICD-10-CM) - Chronic bilateral low back pain with right-sided sciatica  M54.16 (ICD-10-CM) - Lumbar radiculopathy   Rationale for Evaluation and Treatment: Rehabilitation  THERAPY DIAGNOSIS:  Lumbar pain  Muscle weakness (generalized)  ONSET DATE: Years  SUBJECTIVE:                                                                                                                                                                                           SUBJECTIVE STATEMENT: Pt reports being more active walking more. Was sick last week which is why she missed her appt. Reports LBP has been better. Only 3/10 NPS.    PERTINENT HISTORY:  Pt has PMH including: CAD, GERD, Heart Murmur, Hiatal hernia, hyperlipidemia, HTN, MI, neuropathy,  tobacco abuse  PAIN: Are you having pain? Yes: NPRS scale: 3/10 Pain location: Center of lower back Pain description: dull ache Aggravating factors: walking or standing for prolonged period time Relieving factors: sitting  PRECAUTIONS:  None, but has several stints  WEIGHT BEARING RESTRICTIONS: No  FALLS: Has patient fallen in last 6 months? No  LIVING ENVIRONMENT: Lives with: lives with their daughter Lives in: House/apartment Stairs: Yes: External: 5 steps; on right going up Has following equipment at home: None  OCCUPATION: On disability  PLOF: Independent  PATIENT GOALS: Relieve pain  NEXT MD VISIT: When finished with PT  OBJECTIVE:   DIAGNOSTIC FINDINGS:   EXAM: MRI CERVICAL SPINE WITHOUT CONTRAST  IMPRESSION: 1. Normal MRI appearance of the cervical spinal cord. No cord signal changes to suggest myelopathy. 2.  Right paracentral disc osteophyte complex at C4-5 with resultant mild canal and right C5 foraminal stenosis. 3. Disc bulging with uncovertebral spurring at C5-6 and C6-7 with resultant mild spinal stenosis.  PATIENT SURVEYS: FOTO 29/42  SCREENING FOR RED FLAGS: Bowel or bladder incontinence: No Spinal tumors: No Cauda equina syndrome: No Compression  fracture: No Abdominal aneurysm: Yes: but was repaired back in 2019.  COGNITION: Overall cognitive status: Within functional limits for tasks assessed     SENSATION: WFL   POSTURE: rounded shoulders, forward head, and posterior pelvic tilt  PALPATION:  Pt has tenderness to palpation along the L paraspinals in the lumbar region.  Pt also has muscle guarding when performing PAVM's in the lumbar spine.    LUMBAR ROM:  AROM eval  Flexion 100%  Extension 60%  Right lateral flexion 90%  Left lateral flexion 90%  Right rotation 95%  Left rotation 95%   (Blank rows = not tested)  LOWER EXTREMITY ROM:   Active  Right eval Left eval  Hip flexion    Hip extension    Hip abduction    Hip adduction    Hip internal rotation    Hip external rotation    Knee flexion    Knee extension    Ankle dorsiflexion    Ankle plantarflexion    Ankle inversion    Ankle eversion     (Blank rows = not tested)  LOWER EXTREMITY MMT:  MMT Right eval Left eval  Hip flexion 4 4  Hip extension    Hip abduction 4 4  Hip adduction 4 4  Hip internal rotation    Hip external rotation    Knee flexion 4 4  Knee extension 4+ 4+  Ankle dorsiflexion 4 5  Ankle plantarflexion    Ankle inversion    Ankle eversion     (Blank rows = not tested)  LUMBAR SPECIAL TESTS: Straight leg raise test: Negative, Slump test: Negative, and FABER test: Negative  FUNCTIONAL TESTS: 5 times sit to stand: 34.04 10 meter walk test: 0.89 m/s  GAIT: Distance walked: 20' Assistive device utilized: None Level of assistance: SBA Comments: Pt with slowed  gait pattern.  TODAY'S TREATMENT: DATE: 04/21/23   There.Ex:  NuStep, seat 8, arms 10, level 4 for increased mobility of the lumbar spine and LE's  STS with 6# Med ball overhead raises: 3x8  Standing exercises:   3 way hip with 4# AW's: 2x10/side. Requiring mod to max multimodal cuing for all planes as pt has very difficult time with motor control throughout.   Blue TB paloff press: 2x10/side. Mod VC's with fair carryover   Quadruped bird dogs with use of 1/2 bolster on pelvis as proprioceptive cure for neutral pelvic alignment. 2x6/side.             PATIENT EDUCATION:  Education details: Pt educated on role of PT and services provided during current POC, along with prognosis and information about the clinic. Person educated: Patient Education method: Explanation Education comprehension: verbalized understanding  HOME EXERCISE PROGRAM: Access Code: [email or web address] URL: [email or web address] Date: 03/31/2023 Prepared by: Ronnie Derby  Exercises - Supine Lower Trunk Rotation  - 1 x daily - 7 x weekly - 3 sets - 10 reps - Supine Bridge  - 1 x daily - 7 x weekly - 3 sets - 10 reps - Supine Posterior Pelvic Tilt  - 1 x daily - 7 x weekly - 3 sets - 10 reps - Supine Hip Adduction Isometric with Ball  - 1 x daily - 7 x weekly - 3 sets - 10 reps - Supine Piriformis Stretch with Foot on Ground  - 1 x daily - 7 x weekly - 3 sets - 10 reps - Cat Cow  - 1 x daily - 7 x weekly - 2 sets - 112 reps -  Sidelying Open Book Thoracic Lumbar Rotation and Extension  - 1 x daily - 7 x weekly - 2 sets - 12 reps   Access Code: [email or web address] URL: [email or web address] Date: 03/03/2023 Prepared by: Tomasa Hose  Exercises - Supine Lower Trunk Rotation  - 1 x daily - 7 x weekly - 3 sets - 10 reps - Supine Bridge  - 1 x daily - 7 x weekly - 3 sets - 10 reps - Supine Posterior Pelvic Tilt  - 1 x daily - 7 x weekly - 3 sets - 10 reps - Supine Hip Adduction Isometric with  Ball  - 1 x daily - 7 x weekly - 3 sets - 10 reps - Supine Piriformis Stretch with Foot on Ground  - 1 x daily - 7 x weekly - 3 sets - 10 reps  ASSESSMENT:  CLINICAL IMPRESSION: Pt arriving for treatment after 2 week hiatus from PT. Reports her lower back pain has consistently been improved since she is beginning to be more active at home. Although pt has not ben overly compliant with HEP pt has been walking more and being active in her garden. Pt will be at end of her POC next visit and feels comfortable with discharge and completing home program.    OBJECTIVE IMPAIRMENTS: Abnormal gait, decreased activity tolerance, decreased mobility, difficulty walking, decreased ROM, decreased strength, obesity, and pain.   ACTIVITY LIMITATIONS: lifting, bending, standing, and locomotion level  PARTICIPATION LIMITATIONS: meal prep, cleaning, laundry, community activity, and yard work  PERSONAL FACTORS: Age, Behavior pattern, Fitness, Past/current experiences, Profession, Sex, Time since onset of injury/illness/exacerbation, and 3+ comorbidities: DM2, heart murmur, Htn, MI, tobacco use  are also affecting patient's functional outcome.   REHAB POTENTIAL: Good  CLINICAL DECISION MAKING: Stable/uncomplicated  EVALUATION COMPLEXITY: Low   GOALS: Goals reviewed with patient? Yes  SHORT TERM GOALS: Target date: 03/31/2023  Pt will be independent with HEP in order to demonstrate increased ability to perform tasks related to occupation/hobbies. Baseline:  Pt given exercises to perform at home.; 04/21/23: Has not been compliant with formal HEP.  Goal status: INITIAL   LONG TERM GOALS: Target date: 05/26/2023  1.  Patient (> 63 years old) will complete five times sit to stand test in < 15 seconds indicating an increased LE strength and improved balance. Baseline: 34.05 sec Goal status: INITIAL  2.  Patient will increase FOTO score to equal to or greater than 42 to demonstrate statistically significant  improvement in mobility and quality of life.  Baseline: 29 Goal status: INITIAL   3.  Patient will reduce timed up and go to <11 seconds to reduce fall risk and demonstrate improved transfer/gait ability. Baseline: Not performed at initial visit Goal status: INITIAL  4.  Patient will increase 10 meter walk test to >1.36m/s as to improve gait speed for better community ambulation and to reduce fall risk. Baseline: 0.89 m/s Goal status: INITIAL  5.  Patient will report 2/10 pain after standing in one location for >15 minutes in order simulate pt being able to do daily tasks such as washing dishes.   Baseline: 5/10 pain at rest Goal status: INITIAL    PLAN:  PT FREQUENCY: 2x/week  PT DURATION: 8 weeks  PLANNED INTERVENTIONS: Therapeutic exercises, Therapeutic activity, Neuromuscular re-education, Balance training, Gait training, Patient/Family education, Self Care, Joint mobilization, Joint manipulation, Stair training, Vestibular training, DME instructions, Aquatic Therapy, Dry Needling, Electrical stimulation, Spinal manipulation, Spinal mobilization, Cryotherapy, Moist heat, Traction, Manual therapy, and Re-evaluation.  PLAN FOR  NEXT SESSION: Discharge    Delphia Grates. Fairly IV, PT, DPT Physical Therapist- [place]  [facility]  04/21/23, 8:57 AM

## 2023-04-24 ENCOUNTER — Telehealth: Payer: Self-pay | Admitting: Cardiovascular Disease

## 2023-04-24 MED ORDER — LOSARTAN POTASSIUM 100 MG PO TABS: 100.0000 mg | ORAL_TABLET | Freq: Every day | ORAL | 2 refills | Status: DC

## 2023-04-24 NOTE — Telephone Encounter (Signed)
Requested Prescriptions   Signed Prescriptions Disp Refills   losartan (COZAAR) 100 MG tablet 30 tablet 2    Sig: Take 1 tablet (100 mg total) by mouth daily.    Authorizing Provider: Antonieta Iba    Ordering User: Thayer Headings, Adream Parzych L

## 2023-04-24 NOTE — Telephone Encounter (Signed)
*  STAT* If patient is at the pharmacy, call can be transferred to refill team.   1. Which medications need to be refilled? (please list name of each medication and dose if known)  new prescription for Losartan  2. Which pharmacy/location (including street and city if local pharmacy) is medication to be sent to?[facility] [address], [place]  3. Do they need a 30 day or 90 day supply? 30 days and refills

## 2023-04-28 ENCOUNTER — Ambulatory Visit: Payer: Medicaid Other | Admitting: Occupational Therapy

## 2023-04-28 ENCOUNTER — Ambulatory Visit: Payer: Medicaid Other

## 2023-04-28 DIAGNOSIS — M79641 Pain in right hand: Secondary | ICD-10-CM

## 2023-04-28 DIAGNOSIS — M6281 Muscle weakness (generalized): Secondary | ICD-10-CM

## 2023-04-28 DIAGNOSIS — M545 Low back pain, unspecified: Secondary | ICD-10-CM | POA: Diagnosis not present

## 2023-04-28 DIAGNOSIS — M25641 Stiffness of right hand, not elsewhere classified: Secondary | ICD-10-CM

## 2023-04-28 NOTE — Therapy (Signed)
[facility] [address] [place], [address] Phone: [phone]   Fax:  [phone]  Occupational Therapy Treatment  Patient Details  Name: Heather Mahoney MRN: [id number] Date of Birth: 02/26/1966 Referring Provider (OT): Wenona Georgia   Encounter Date: 04/28/2023   OT End of Session - 04/28/23 0932     Visit Number 5    Number of Visits 5    Date for OT Re-Evaluation 04/28/23    OT Start Time 0835    OT Stop Time 0909    OT Time Calculation (min) 34 min    Activity Tolerance Patient tolerated treatment well    Behavior During Therapy [facility] for tasks assessed/performed             Past Medical History:  Diagnosis Date   CAD (coronary artery disease)    a. PCI to proximal and distal RCA 3/26   Diabetes mellitus without complication (HCC)    GERD (gastroesophageal reflux disease)    Heart murmur    MR   Hiatal hernia    Hot flashes    Hyperlipidemia    Hypertension    Myocardial infarction (HCC)    Neuropathy    Tobacco abuse    a. Started at age 17, quit during 3 pregnancies. b. 1 PPD for a 40 pack-year hx (2019)     Past Surgical History:  Procedure Laterality Date   ABDOMINAL AORTIC ANEURYSM REPAIR     2019   BREAST BIOPSY Right    Multiple biopsies-all benign   BREAST SURGERY  1986   I&D for 'milk duct'   COLONOSCOPY WITH PROPOFOL N/A 07/07/2022   Procedure: COLONOSCOPY WITH PROPOFOL;  Surgeon: Wyline Mood, MD;  Location: [facility] ENDOSCOPY;  Service: Gastroenterology;  Laterality: N/A;   CORONARY STENT INTERVENTION N/A 01/13/2019   Procedure: CORONARY STENT INTERVENTION;  Surgeon: Yvonne Kendall, MD;  Location: [facility] INVASIVE CV LAB;  Service: Cardiovascular;  Laterality: N/A;   EMBOLECTOMY  08/13/2018   Procedure: Right SFA and profunda femoris Fogarty embolectomy 3  Fogarty embolectomy balloon;  Surgeon: Annice Needy, MD;  Location: [facility] ORS;  Service: Vascular;;   ENDARTERECTOMY FEMORAL Right  08/13/2018   Procedure: Right common femoral, profunda femoris, and superficial femoral artery endarterectomies and patch angioplasty ;  Surgeon: Annice Needy, MD;  Location: [facility] ORS;  Service: Vascular;  Laterality: Right;   ENDOVASCULAR REPAIR/STENT GRAFT N/A 08/13/2018   Procedure: ENDOVASCULAR REPAIR/STENT GRAFT;  Surgeon: Renford Dills, MD;  Location: [facility] INVASIVE CV LAB;  Service: Cardiovascular;  Laterality: N/A;   LEFT HEART CATH AND CORONARY ANGIOGRAPHY Left 01/13/2019   Procedure: LEFT HEART CATH AND CORONARY ANGIOGRAPHY;  Surgeon: Antonieta Iba, MD;  Location: [facility] INVASIVE CV LAB;  Service: Cardiovascular;  Laterality: Left;   LOWER EXTREMITY ANGIOGRAPHY Right 09/08/2022   Procedure: Lower Extremity Angiography;  Surgeon: Annice Needy, MD;  Location: [facility] INVASIVE CV LAB;  Service: Cardiovascular;  Laterality: Right;   LOWER EXTREMITY ANGIOGRAPHY Right 09/15/2022   Procedure: Lower Extremity Angiography;  Surgeon: Annice Needy, MD;  Location: [facility] INVASIVE CV LAB;  Service: Cardiovascular;  Laterality: Right;   SALPINGOOPHORECTOMY Left 2000    There were no vitals filed for this visit.   Subjective Assessment - 04/28/23 0928     Subjective  I looked into paraffin - my sister and I - I am done with PT for my back today -and I think I can cont with my hand exercises at  home- finger still stiff - both my hands are stiff in the morning and ache    Pertinent History Patient had 12/03/2022 right third digit trigger finger release done by Dr. Rosita Kea.  Patient reports she had this for 10 years prior.  Patient referred to OT for continued stiffness and pain in right third digit.    Patient Stated Goals I want to bend the middle finger and be able to make a fist to use it at home with cooking and sewing and playing with her 58-year-old grandchild    Currently in Pain? Yes    Pain Score 3     Pain Location Hand    Pain Orientation Right    Pain Descriptors / Indicators  Aching;Tightness    Pain Type Surgical pain    Pain Onset More than a month ago                [facility] OT Assessment - 04/28/23 0001       Strength   Right Hand Grip (lbs) 50    Right Hand Lateral Pinch 18 lbs    Right Hand 3 Point Pinch 12 lbs    Left Hand Grip (lbs) 36   pain - 3rd triggering   Left Hand Lateral Pinch 18 lbs    Left Hand 3 Point Pinch 11 lbs      Right Hand AROM   R Long  MCP 0-90 90 Degrees    R Long PIP 0-100 90 Degrees    R Long DIP 0-70 60 Degrees               Patient return to OT this date with continues limited composite flexion of the right third digit. PROM within normal limits but unable to place and hold -and maintain actively PIP and DIP flexion for composite Blocked intrinsic AROM PIP 90 DIP 60. Patient report increased stiffness and pain in bilateral hands in the morning as well as during the day. Educated her before and recommended use of paraffin bath at home to decrease stiffness and increase motion. Patient report her and her sister is looking into it.       OT Treatments/Exercises (OP) - 04/28/23 0001       RUE Paraffin   Number Minutes Paraffin 8 Minutes    RUE Paraffin Location Hand    Comments decrease stiffness and pain                Reviewed with patient home exercises patient feels she wants to continue at home being discharged from PT for her back.   Passive range of motion for intrinsic fist  With focus on DIP and PIP flexion Prior to passive range of motion for composite flexion All to third digit on the right Prior to attempts for place and hold composite fist   Patient to continue to enlarge grips in the meantime  Also to use larger joints like palms or forearm to carry or lift           OT Education - 04/28/23 0931     Education Details progress and HEP    Person(s) Educated Patient    Methods Explanation;Demonstration;Tactile cues;Verbal cues    Comprehension Verbal cues  required;Returned demonstration;Verbalized understanding                 OT Long Term Goals - 04/28/23 1357       OT LONG TERM GOAL #1   Title Patient be independent in home program  to decrease edema and pain and increase range of motion of third digit to touch palm without increased symptoms    Baseline Edema in the third digit as well as pain 4/10 with active range of motion and on the dorsal 3rd digit as well as palm NOW PROM touching palm pain free  but unable to maintain DIP flexion during composite fist actively    Status Partially Met      OT LONG TERM GOAL #2   Title Third digit active range of motion increased to within normal limits for patient to hold knife, toothbrush and broom without increase symptoms    Baseline PIP flexion 75 degrees and DIP 40.  Pain with flexion 4/10  NOW no pain PROM - AROM strain in palm - MC 90, PIP 90 and DIP 60 can hold objects symtem free    Status Partially Met      OT LONG TERM GOAL #3   Title Right grip and prehension strength increased to within normal limits for her age without increase symptoms for patient to hold broom this left pots and pain and apply with child    Baseline Grip 44 and lateral pinch 14.  3-point pinch 7 pounds on the right with discomfort, left 9 pounds  NOW - no pain grip increase to 50grip, 18 lat and 12 for 3 point    Status Achieved                   Plan - 04/28/23 0932     Clinical Impression Statement Patient presented OT evaluation with a diagnosis of right third digit trigger finger release on 11/25/2022 by Dr. Rosita Kea.  Patient report she had trigger finger for about 10 years.  Patient do have arthritis with stiffness in the hands as well as history of being diabetic.  Patient scar tissue doing better but appear to be limited in composite flexion still because of long standing stiffness in PIP or scar tissue limiting AROM for composite flexion- PROM WNL.   PIP extention of 3rd digit WNL as well as  3rd  digit AROM increase to MC to 90 and PIP from 75 to 90 and DIP from 40 to 60 degrees.  Reinforce with pt to cont to focus on PROM  for intrinsic fist and composite to decrease stiffness to be able to increase AROM and use it functionally .  Patient grip and prehension strength increase to WNL for her age and pain free . Pain in hands better after doing Paraffin - recommend for pt to get unit to use at home in combination with PROM prior to attempts of AROM - pt demo understanding. Pt has trigger finger on L 3rd digit but do not want surgery . Pt discharge at this time.    OT Occupational Profile and History Problem Focused Assessment - Including review of records relating to presenting problem    Occupational performance deficits (Please refer to evaluation for details): ADL's;IADL's;Play;Social Participation;Leisure    Body Structure / Function / Physical Skills ADL;Decreased knowledge of use of DME;Flexibility;ROM;UE functional use;Dexterity;Edema;Pain;Strength;IADL;Sensation    Rehab Potential Fair    Clinical Decision Making Limited treatment options, no task modification necessary    Comorbidities Affecting Occupational Performance: May have comorbidities impacting occupational performance    Modification or Assistance to Complete Evaluation  No modification of tasks or assist necessary to complete eval    OT Frequency 1x / week    OT Duration --   1 wks  OT Treatment/Interventions Self-care/ADL training;Ultrasound;Moist Heat;Paraffin;Fluidtherapy;Contrast Bath;DME and/or AE instruction;Manual Therapy;Passive range of motion;Scar mobilization;Splinting;Patient/family education;Therapeutic exercise    Consulted and Agree with Plan of Care Patient             Patient will benefit from skilled therapeutic intervention in order to improve the following deficits and impairments:   Body Structure / Function / Physical Skills: ADL, Decreased knowledge of use of DME, Flexibility, ROM, UE functional  use, Dexterity, Edema, Pain, Strength, IADL, Sensation       Visit Diagnosis: Pain in right hand  Stiffness of right hand, not elsewhere classified  Muscle weakness (generalized)    Problem List Patient Active Problem List   Diagnosis Date Noted   Encounter for screening colonoscopy    Adenomatous polyp of colon    Acute gastroenteritis 04/26/2020   History of endovascular stent graft for abdominal aortic aneurysm 04/26/2020   Urticaria 04/26/2020   Diabetes (HCC) 07/05/2019   Precordial chest pain    Chest pain 04/09/2019   Atherosclerosis of native arteries of extremity with intermittent claudication (HCC) 04/01/2019   Coronary artery disease of native artery of native heart with stable angina pectoris (HCC) 01/26/2019   CAD (coronary artery disease) 01/14/2019   Unstable angina (HCC) 01/13/2019   PAD (peripheral artery disease) (HCC) 08/30/2018   AAA (abdominal aortic aneurysm) (HCC) 08/10/2018   Chest pain with moderate risk for cardiac etiology 12/18/2017   Scotoma 12/18/2017   PVC (premature ventricular contraction) 02/12/2016   Essential hypertension, benign 02/12/2016   Tobacco use disorder 02/12/2016   Migraine headache 01/09/2016   Lymphocytosis 12/21/2015   Hypertension 12/12/2015   Mixed hyperlipidemia 12/12/2015   Vitamin D deficiency 12/12/2015   Mitral regurgitation 12/12/2015   Hypertensive urgency 11/29/2015   Abdominal pulsatile mass 11/29/2015    Oletta Cohn, OTR/L,CLT 04/28/2023, 2:01 PM  [facility] [address], [place], [address] Phone: [phone]   Fax:  [phone]  Name: Heather Mahoney MRN: [id number] Date of Birth: 1966/05/11

## 2023-04-28 NOTE — Therapy (Signed)
OUTPATIENT PHYSICAL THERAPY THORACOLUMBAR DISCHARGE SUMMARY   Patient Name: Heather Mahoney MRN: [id number] DOB:Jan 15, 1966, 57 y.o., female Today's Date: 04/28/2023  END OF SESSION:  PT End of Session - 04/28/23 0813     Visit Number 7    Number of Visits 8    Date for PT Re-Evaluation 04/28/23    PT Start Time 0816    PT Stop Time 0834    PT Time Calculation (min) 18 min    Activity Tolerance Patient tolerated treatment well    Behavior During Therapy [facility] for tasks assessed/performed             Past Medical History:  Diagnosis Date   CAD (coronary artery disease)    a. PCI to proximal and distal RCA 3/26   Diabetes mellitus without complication (HCC)    GERD (gastroesophageal reflux disease)    Heart murmur    MR   Hiatal hernia    Hot flashes    Hyperlipidemia    Hypertension    Myocardial infarction (HCC)    Neuropathy    Tobacco abuse    a. Started at age 34, quit during 3 pregnancies. b. 1 PPD for a 40 pack-year hx (2019)    Past Surgical History:  Procedure Laterality Date   ABDOMINAL AORTIC ANEURYSM REPAIR     2019   BREAST BIOPSY Right    Multiple biopsies-all benign   BREAST SURGERY  1986   I&D for 'milk duct'   COLONOSCOPY WITH PROPOFOL N/A 07/07/2022   Procedure: COLONOSCOPY WITH PROPOFOL;  Surgeon: Wyline Mood, MD;  Location: [facility] ENDOSCOPY;  Service: Gastroenterology;  Laterality: N/A;   CORONARY STENT INTERVENTION N/A 01/13/2019   Procedure: CORONARY STENT INTERVENTION;  Surgeon: Yvonne Kendall, MD;  Location: [facility] INVASIVE CV LAB;  Service: Cardiovascular;  Laterality: N/A;   EMBOLECTOMY  08/13/2018   Procedure: Right SFA and profunda femoris Fogarty embolectomy 3  Fogarty embolectomy balloon;  Surgeon: Annice Needy, MD;  Location: [facility] ORS;  Service: Vascular;;   ENDARTERECTOMY FEMORAL Right 08/13/2018   Procedure: Right common femoral, profunda femoris, and superficial femoral artery endarterectomies and patch angioplasty ;  Surgeon: Annice Needy, MD;  Location: [facility] ORS;  Service: Vascular;  Laterality: Right;   ENDOVASCULAR REPAIR/STENT GRAFT N/A 08/13/2018   Procedure: ENDOVASCULAR REPAIR/STENT GRAFT;  Surgeon: Renford Dills, MD;  Location: [facility] INVASIVE CV LAB;  Service: Cardiovascular;  Laterality: N/A;   LEFT HEART CATH AND CORONARY ANGIOGRAPHY Left 01/13/2019   Procedure: LEFT HEART CATH AND CORONARY ANGIOGRAPHY;  Surgeon: Antonieta Iba, MD;  Location: [facility] INVASIVE CV LAB;  Service: Cardiovascular;  Laterality: Left;   LOWER EXTREMITY ANGIOGRAPHY Right 09/08/2022   Procedure: Lower Extremity Angiography;  Surgeon: Annice Needy, MD;  Location: [facility] INVASIVE CV LAB;  Service: Cardiovascular;  Laterality: Right;   LOWER EXTREMITY ANGIOGRAPHY Right 09/15/2022   Procedure: Lower Extremity Angiography;  Surgeon: Annice Needy, MD;  Location: [facility] INVASIVE CV LAB;  Service: Cardiovascular;  Laterality: Right;   SALPINGOOPHORECTOMY Left 2000   Patient Active Problem List   Diagnosis Date Noted   Encounter for screening colonoscopy    Adenomatous polyp of colon    Acute gastroenteritis 04/26/2020   History of endovascular stent graft for abdominal aortic aneurysm 04/26/2020   Urticaria 04/26/2020   Diabetes (HCC) 07/05/2019   Precordial chest pain    Chest pain 04/09/2019   Atherosclerosis of native arteries of extremity with intermittent claudication (HCC) 04/01/2019   Coronary artery disease of  native artery of native heart with stable angina pectoris (HCC) 01/26/2019   CAD (coronary artery disease) 01/14/2019   Unstable angina (HCC) 01/13/2019   PAD (peripheral artery disease) (HCC) 08/30/2018   AAA (abdominal aortic aneurysm) (HCC) 08/10/2018   Chest pain with moderate risk for cardiac etiology 12/18/2017   Scotoma 12/18/2017   PVC (premature ventricular contraction) 02/12/2016   Essential hypertension, benign 02/12/2016   Tobacco use disorder 02/12/2016   Migraine headache 01/09/2016   Lymphocytosis  12/21/2015   Hypertension 12/12/2015   Mixed hyperlipidemia 12/12/2015   Vitamin D deficiency 12/12/2015   Mitral regurgitation 12/12/2015   Hypertensive urgency 11/29/2015   Abdominal pulsatile mass 11/29/2015    PCP: [facility]  REFERRING PROVIDER: Drake Leach, PA-C  REFERRING DIAG:  [phone] (ICD-10-CM) - Chronic bilateral low back pain with right-sided sciatica  M54.16 (ICD-10-CM) - Lumbar radiculopathy   Rationale for Evaluation and Treatment: Rehabilitation  THERAPY DIAGNOSIS:  Lumbar pain  Muscle weakness (generalized)  ONSET DATE: Years  SUBJECTIVE:                                                                                                                                                                                           SUBJECTIVE STATEMENT: Pt reports her back is improved still. Able to stand and cook for her grand child's birthday party for > 1 hour. Denies pain this morning.   PERTINENT HISTORY:  Pt has PMH including: CAD, GERD, Heart Murmur, Hiatal hernia, hyperlipidemia, HTN, MI, neuropathy,  tobacco abuse  PAIN: Are you having pain? Yes: NPRS scale: 0/10 Pain location: Center of lower back Pain description: dull ache Aggravating factors: walking or standing for prolonged period time Relieving factors: sitting  PRECAUTIONS:  None, but has several stints  WEIGHT BEARING RESTRICTIONS: No  FALLS: Has patient fallen in last 6 months? No  LIVING ENVIRONMENT: Lives with: lives with their daughter Lives in: House/apartment Stairs: Yes: External: 5 steps; on right going up Has following equipment at home: None  OCCUPATION: On disability  PLOF: Independent  PATIENT GOALS: Relieve pain  NEXT MD VISIT: When finished with PT  OBJECTIVE:   DIAGNOSTIC FINDINGS:   EXAM: MRI CERVICAL SPINE WITHOUT CONTRAST  IMPRESSION: 1. Normal MRI appearance of the cervical spinal cord. No cord signal changes to suggest  myelopathy. 2. Right paracentral disc osteophyte complex at C4-5 with resultant mild canal and right C5 foraminal stenosis. 3. Disc bulging with uncovertebral spurring at C5-6 and C6-7 with resultant mild spinal stenosis.  PATIENT SURVEYS: FOTO 29/42  SCREENING FOR RED FLAGS: Bowel or bladder incontinence: No Spinal tumors: No Cauda equina syndrome: No Compression  fracture: No Abdominal aneurysm: Yes: but was repaired back in 2019.  COGNITION: Overall cognitive status: Within functional limits for tasks assessed     SENSATION: WFL   POSTURE: rounded shoulders, forward head, and posterior pelvic tilt  PALPATION:  Pt has tenderness to palpation along the L paraspinals in the lumbar region.  Pt also has muscle guarding when performing PAVM's in the lumbar spine.    LUMBAR ROM:  AROM eval  Flexion 100%  Extension 60%  Right lateral flexion 90%  Left lateral flexion 90%  Right rotation 95%  Left rotation 95%   (Blank rows = not tested)  LOWER EXTREMITY ROM:   Active  Right eval Left eval  Hip flexion    Hip extension    Hip abduction    Hip adduction    Hip internal rotation    Hip external rotation    Knee flexion    Knee extension    Ankle dorsiflexion    Ankle plantarflexion    Ankle inversion    Ankle eversion     (Blank rows = not tested)  LOWER EXTREMITY MMT:  MMT Right eval Left eval  Hip flexion 4 4  Hip extension    Hip abduction 4 4  Hip adduction 4 4  Hip internal rotation    Hip external rotation    Knee flexion 4 4  Knee extension 4+ 4+  Ankle dorsiflexion 4 5  Ankle plantarflexion    Ankle inversion    Ankle eversion     (Blank rows = not tested)  LUMBAR SPECIAL TESTS: Straight leg raise test: Negative, Slump test: Negative, and FABER test: Negative  FUNCTIONAL TESTS: 5 times sit to stand: 34.04 10 meter walk test: 0.89 m/s  GAIT: Distance walked: 20' Assistive device utilized: None Level of assistance: SBA Comments: Pt  with slowed gait pattern.  TODAY'S TREATMENT: DATE: 04/28/23   There.Ex:  Reviewing goals and progress in POC. See clinical impression and goals section for details.    PATIENT EDUCATION:  Education details: Pt educated on role of PT and services provided during current POC, along with prognosis and information about the clinic. Person educated: Patient Education method: Explanation Education comprehension: verbalized understanding  HOME EXERCISE PROGRAM: Access Code: [email or web address] URL: [email or web address] Date: 03/31/2023 Prepared by: Ronnie Derby  Exercises - Supine Lower Trunk Rotation  - 1 x daily - 7 x weekly - 3 sets - 10 reps - Supine Bridge  - 1 x daily - 7 x weekly - 3 sets - 10 reps - Supine Posterior Pelvic Tilt  - 1 x daily - 7 x weekly - 3 sets - 10 reps - Supine Hip Adduction Isometric with Ball  - 1 x daily - 7 x weekly - 3 sets - 10 reps - Supine Piriformis Stretch with Foot on Ground  - 1 x daily - 7 x weekly - 3 sets - 10 reps - Cat Cow  - 1 x daily - 7 x weekly - 2 sets - 112 reps - Sidelying Open Book Thoracic Lumbar Rotation and Extension  - 1 x daily - 7 x weekly - 2 sets - 12 reps   Access Code: [email or web address] URL: [email or web address] Date: 03/03/2023 Prepared by: Tomasa Hose  Exercises - Supine Lower Trunk Rotation  - 1 x daily - 7 x weekly - 3 sets - 10 reps - Supine Bridge  - 1 x daily - 7 x weekly - 3 sets - 10 reps - Supine Posterior Pelvic Tilt  -  1 x daily - 7 x weekly - 3 sets - 10 reps - Supine Hip Adduction Isometric with Ball  - 1 x daily - 7 x weekly - 3 sets - 10 reps - Supine Piriformis Stretch with Foot on Ground  - 1 x daily - 7 x weekly - 3 sets - 10 reps  ASSESSMENT:  CLINICAL IMPRESSION: Pt at end of her current POC. Pt has met 4/5 of her long term goals. Pt has significantly improved her LBP and LE strength and general functional mobility as evidenced by TUG, 5xSTS, FOTO, and 10 meter gait speed.  Although pt is still having pain with standing ADL's it has significantly improved from 5/10 NPS to 3/10 NPS. Pt ready for discharge from PT based off of her accomplishments. Educated pt on importance of HEP and generally staying active to maintain gains made in PT. Pt discharged from therapy at this time.   OBJECTIVE IMPAIRMENTS: Abnormal gait, decreased activity tolerance, decreased mobility, difficulty walking, decreased ROM, decreased strength, obesity, and pain.   ACTIVITY LIMITATIONS: lifting, bending, standing, and locomotion level  PARTICIPATION LIMITATIONS: meal prep, cleaning, laundry, community activity, and yard work  PERSONAL FACTORS: Age, Behavior pattern, Fitness, Past/current experiences, Profession, Sex, Time since onset of injury/illness/exacerbation, and 3+ comorbidities: DM2, heart murmur, Htn, MI, tobacco use  are also affecting patient's functional outcome.   REHAB POTENTIAL: Good  CLINICAL DECISION MAKING: Stable/uncomplicated  EVALUATION COMPLEXITY: Low   GOALS: Goals reviewed with patient? Yes  SHORT TERM GOALS: Target date: 03/31/2023  Pt will be independent with HEP in order to demonstrate increased ability to perform tasks related to occupation/hobbies. Baseline:  Pt given exercises to perform at home.; 04/21/23: Has not been compliant with formal HEP.  Goal status: INITIAL   LONG TERM GOALS: Target date: 05/26/2023  1.  Patient (> 40 years old) will complete five times sit to stand test in < 15 seconds indicating an increased LE strength and improved balance. Baseline: 34.05 sec; 04/28/23: 10.96 seconds  Goal status: MET  2.  Patient will increase FOTO score to equal to or greater than 42 to demonstrate statistically significant improvement in mobility and quality of life.  Baseline: 29; 04/28/23: 52 Goal status: MET   3.  Patient will reduce timed up and go to <11 seconds to reduce fall risk and demonstrate improved transfer/gait ability. Baseline: Not  performed at initial visit; 04/28/23: 9.93 seconds Goal status: MET  4.  Patient will increase 10 meter walk test to >1.78m/s as to improve gait speed for better community ambulation and to reduce fall risk. Baseline: 0.89 m/s; 04/28/23: 1.19 m/sec Goal status: MET  5.  Patient will report 2/10 pain after standing in one location for > 15 minutes in order simulate pt being able to do daily tasks such as washing dishes.   Baseline: 5/10 pain at rest; 04/28/23: 3/10 NPS Goal status: Partially met     PLAN:  PT FREQUENCY: 2x/week  PT DURATION: 8 weeks  PLANNED INTERVENTIONS: Therapeutic exercises, Therapeutic activity, Neuromuscular re-education, Balance training, Gait training, Patient/Family education, Self Care, Joint mobilization, Joint manipulation, Stair training, Vestibular training, DME instructions, Aquatic Therapy, Dry Needling, Electrical stimulation, Spinal manipulation, Spinal mobilization, Cryotherapy, Moist heat, Traction, Manual therapy, and Re-evaluation.  PLAN FOR NEXT SESSION:   Delphia Grates. Fairly IV, PT, DPT Physical Therapist-   [facility]  04/28/23, 8:39 AM

## 2023-05-06 NOTE — Progress Notes (Signed)
Referring Physician:  No referring provider defined for this encounter.  Primary Physician:  [facility]  History of Present Illness: 05/07/2023 [name] has a history of CAD, GERD, heart murmur, hiatal hernia, hyperlipidemia, HTN, MI, PAD, and DM.   Had vascular procedure (stenting of right SFA) with Dr. Wyn Quaker on 09/15/22.   Last had phone visit with me on 12/23/22 to review her cervical MRI. She has known mild central stenosis C4-C7 with cervical spondylosis. I did not think this is causing her dexterity or balance issues.   She was referred to neurology for her balance issues. She has not seen them yet.   She also complained of constant LBP with chronic constant right leg pain from medial groin to knee and constant right knee pain to her ankle (started after stent was placed). No left leg pain.    MRI of lumbar spine was ordered and insurance wanted her to do PT.   She has done 8 visits of PT for her lumbar spine from 03/10/23 to 04/28/23. Looks like back pain has improved. She was discharged.   She is here for follow up.   She had improvement in pain with PT, but still has constant LBP with prolonged standing and walking. She still has some right leg pain, but it is improved. No numbness, tingling in legs. She has weakness in right leg.   She is taking neurontin. She has some relief with this.   Still having issues with her hands- had trigger finger release on right and can't make a full fist. Still has trigger finger on left. Is seeing ortho for this.   She is on PLAVIX.   Bowel/Bladder Dysfunction: none  Conservative measures:  Physical therapy:  8 visits of PT for her lumbar spine from 03/10/23 to 04/28/23 Multimodal medical therapy including regular antiinflammatories: mobic, robaxin, neurontin  Injections: no epidural steroid injections  Past Surgery: None  Heather Mahoney has some balance issues and dexterity issues with her hands likely due to  trigger fingers.   The symptoms are causing a significant impact on the patient's life.   Review of Systems:  A 10 point review of systems is negative, except for the pertinent positives and negatives detailed in the HPI.  Past Medical History: Past Medical History:  Diagnosis Date   CAD (coronary artery disease)    a. PCI to proximal and distal RCA 3/26   Diabetes mellitus without complication (HCC)    GERD (gastroesophageal reflux disease)    Heart murmur    MR   Hiatal hernia    Hot flashes    Hyperlipidemia    Hypertension    Myocardial infarction (HCC)    Neuropathy    Tobacco abuse    a. Started at age 5, quit during 3 pregnancies. b. 1 PPD for a 40 pack-year hx (2019)     Past Surgical History: Past Surgical History:  Procedure Laterality Date   ABDOMINAL AORTIC ANEURYSM REPAIR     2019   BREAST BIOPSY Right    Multiple biopsies-all benign   BREAST SURGERY  1986   I&D for 'milk duct'   COLONOSCOPY WITH PROPOFOL N/A 07/07/2022   Procedure: COLONOSCOPY WITH PROPOFOL;  Surgeon: Wyline Mood, MD;  Location: [facility] ENDOSCOPY;  Service: Gastroenterology;  Laterality: N/A;   CORONARY STENT INTERVENTION N/A 01/13/2019   Procedure: CORONARY STENT INTERVENTION;  Surgeon: Yvonne Kendall, MD;  Location: [facility] INVASIVE CV LAB;  Service: Cardiovascular;  Laterality: N/A;  EMBOLECTOMY  08/13/2018   Procedure: Right SFA and profunda femoris Fogarty embolectomy 3  Fogarty embolectomy balloon;  Surgeon: Annice Needy, MD;  Location: [facility] ORS;  Service: Vascular;;   ENDARTERECTOMY FEMORAL Right 08/13/2018   Procedure: Right common femoral, profunda femoris, and superficial femoral artery endarterectomies and patch angioplasty ;  Surgeon: Annice Needy, MD;  Location: [facility] ORS;  Service: Vascular;  Laterality: Right;   ENDOVASCULAR REPAIR/STENT GRAFT N/A 08/13/2018   Procedure: ENDOVASCULAR REPAIR/STENT GRAFT;  Surgeon: Renford Dills, MD;  Location: [facility] INVASIVE CV LAB;  Service:  Cardiovascular;  Laterality: N/A;   LEFT HEART CATH AND CORONARY ANGIOGRAPHY Left 01/13/2019   Procedure: LEFT HEART CATH AND CORONARY ANGIOGRAPHY;  Surgeon: Antonieta Iba, MD;  Location: [facility] INVASIVE CV LAB;  Service: Cardiovascular;  Laterality: Left;   LOWER EXTREMITY ANGIOGRAPHY Right 09/08/2022   Procedure: Lower Extremity Angiography;  Surgeon: Annice Needy, MD;  Location: [facility] INVASIVE CV LAB;  Service: Cardiovascular;  Laterality: Right;   LOWER EXTREMITY ANGIOGRAPHY Right 09/15/2022   Procedure: Lower Extremity Angiography;  Surgeon: Annice Needy, MD;  Location: [facility] INVASIVE CV LAB;  Service: Cardiovascular;  Laterality: Right;   SALPINGOOPHORECTOMY Left 2000    Allergies: Allergies as of 05/07/2023 - Review Complete 05/07/2023  Allergen Reaction Noted   Strawberry extract Hives and Swelling 04/25/2015   Strawberry flavor Rash 01/05/2018    Medications: Outpatient Encounter Medications as of 05/07/2023  Medication Sig   amLODipine (NORVASC) 10 MG tablet Take 1 tablet (10 mg total) by mouth daily.   aspirin 81 MG EC tablet Take 1 tablet (81 mg total) by mouth daily.   cloNIDine (CATAPRES) 0.2 MG tablet Take 1 tablet (0.2 mg total) by mouth in the morning and at bedtime. Take an extra 0.1 mg for BP >160   clopidogrel (PLAVIX) 75 MG tablet Take 1 tablet (75 mg total) by mouth daily.   dapagliflozin propanediol (FARXIGA) 10 MG TABS tablet Take 10 mg by mouth daily.   ezetimibe (ZETIA) 10 MG tablet Take 1 tablet (10 mg total) by mouth daily.   gabapentin (NEURONTIN) 600 MG tablet Take 600 mg by mouth 3 (three) times daily as needed.   losartan (COZAAR) 100 MG tablet Take 1 tablet (100 mg total) by mouth daily.   metoprolol succinate (TOPROL-XL) 50 MG 24 hr tablet Take 1 tablet (50 mg total) by mouth daily. Take with or immediately following a meal.   omeprazole (PRILOSEC) 20 MG capsule Take 1 capsule (20 mg total) by mouth 2 (two) times daily before a meal.   rosuvastatin  (CRESTOR) 40 MG tablet Take 1 tablet (40 mg total) by mouth daily.   venlafaxine (EFFEXOR) 75 MG tablet Take 75 mg by mouth 2 (two) times daily with a meal.   [DISCONTINUED] empagliflozin (JARDIANCE) 10 MG TABS tablet Take by mouth daily. (Patient not taking: Reported on 03/03/2023)   No facility-administered encounter medications on file as of 05/07/2023.    Social History: Social History   Tobacco Use   Smoking status: Former    Current packs/day: 0.00    Average packs/day: 0.3 packs/day for 38.0 years (9.5 ttl pk-yrs)    Types: Cigarettes    Start date: 08/10/1980    Quit date: 08/10/2018    Years since quitting: 4.7   Smokeless tobacco: Never  Vaping Use   Vaping status: Never Used  Substance Use Topics   Alcohol use: Not Currently    Comment: occassional,none last 24hrs   Drug use: Yes  Frequency: 2.0 times per week    Types: Marijuana    Comment: smokes Marijuana 'when I cant sleep, maybe once or twice a week'    Family Medical History: Family History  Problem Relation Age of Onset   Breast cancer Mother    Diabetes Mother    Hypertension Mother    Hyperlipidemia Mother    Heart disease Mother        CABG X 4   Asthma Mother 78   Sarcoidosis Mother        In remission   Heart attack Mother    Breast cancer Maternal Grandmother 23   Diabetes Maternal Grandmother    Heart disease Maternal Grandmother    Hyperlipidemia Maternal Grandmother     Physical Examination: Vitals:   05/07/23 0928 05/07/23 1108  BP: (!) 140/82 (!) 144/90      Awake, alert, oriented to person, place, and time.  Speech is clear and fluent. Fund of knowledge is appropriate.   Cranial Nerves: Pupils equal round and reactive to light.  Facial tone is symmetric.    No abnormal lesions on exposed skin.   Strength: Side Biceps Triceps Deltoid Interossei Grip Wrist Ext. Wrist Flex.  R 5 5 5 5 5 5 5   L 5 5 5 5 5 5 5    Side Iliopsoas Quads Hamstring PF DF EHL  R 5 5 5 5 5 5   L 5 5 5  5 5 5    Reflexes are 2+ and symmetric at the biceps, brachioradialis, patella and achilles.   Hoffman's is absent.  Clonus is not present.   Bilateral upper and lower extremity sensation is intact to light touch, but diminished in right lateral biceps region.   She has slow gait.     Medical Decision Making  Imaging: none  Assessment and Plan: Ms. Ginsberg is a pleasant 57 y.o. female had improvement in pain with PT, but still has constant LBP with prolonged standing and walking. She still has some right leg pain, but it is improved. No numbness, tingling in legs. She has weakness in right leg.   Treatment options discussed with patient and following plan made:   - Continue current medications including neurontin from other providers.  - Of note, she is on PLAVIX.  - MRI of lumbar spine to further evaluate right lumbar radiculopathy. No improvement time, PT, or medications (neurontin).  - I still recommend she see neurology for balance issues. She will also discuss with PCP.  - Will schedule phone follow up with me to review MRI results once I have them back.   BP was slightly elevated. No symptoms of chest pain, shortness of breath, or blurry vision. She will follow up with PCP about this. If she develops CP, SOB, blurry vision, or severe headache, then she will go to ED.  She notes a very minimal headache that is not severe.   I spent a total of 25 minutes in face-to-face and non-face-to-face activities related to this patient's care today including review of outside records, review of imaging, review of symptoms, physical exam, discussion of differential diagnosis, discussion of treatment options, and documentation.   Drake Leach PA-C Dept. of Neurosurgery

## 2023-05-07 ENCOUNTER — Ambulatory Visit (INDEPENDENT_AMBULATORY_CARE_PROVIDER_SITE_OTHER): Payer: Medicaid Other | Admitting: Orthopedic Surgery

## 2023-05-07 ENCOUNTER — Encounter: Payer: Self-pay | Admitting: Orthopedic Surgery

## 2023-05-07 VITALS — BP 144/90 | Ht 66.0 in | Wt 168.0 lb

## 2023-05-07 DIAGNOSIS — R2689 Other abnormalities of gait and mobility: Secondary | ICD-10-CM | POA: Diagnosis not present

## 2023-05-07 DIAGNOSIS — G8929 Other chronic pain: Secondary | ICD-10-CM

## 2023-05-07 DIAGNOSIS — M5441 Lumbago with sciatica, right side: Secondary | ICD-10-CM | POA: Diagnosis not present

## 2023-05-07 DIAGNOSIS — M5416 Radiculopathy, lumbar region: Secondary | ICD-10-CM

## 2023-05-07 NOTE — Patient Instructions (Signed)
It was so nice to see you today. Thank you so much for coming in.    I want to get an MRI of your lower back to look into things further. We will get this approved through your insurance and  [facility]patient [facility] will call you to schedule the appointment.   Follow up with your PCP regarding your balance issues. I still recommend that you see neurology at the [facility]. You can call them at [phone].   It takes 5-7 days for me to get the MRI results back after it is done. Once I have them, we will call you to schedule a phone visit follow up with me.   Please do not hesitate to call if you have any questions or concerns. You can also message me in MyChart.   If you have not heard back about any of the above things in the next week, please call the office so we can help you get them scheduled.   Drake Leach PA-C [phone]

## 2023-05-28 ENCOUNTER — Ambulatory Visit
Admission: RE | Admit: 2023-05-28 | Discharge: 2023-05-28 | Disposition: A | Discharge status: Home / Self Care | Payer: Medicaid Other | Source: Ambulatory Visit | Attending: Orthopedic Surgery | Admitting: Orthopedic Surgery

## 2023-05-28 DIAGNOSIS — M5441 Lumbago with sciatica, right side: Secondary | ICD-10-CM | POA: Diagnosis present

## 2023-05-28 DIAGNOSIS — M5416 Radiculopathy, lumbar region: Secondary | ICD-10-CM | POA: Insufficient documentation

## 2023-05-28 DIAGNOSIS — G8929 Other chronic pain: Secondary | ICD-10-CM | POA: Insufficient documentation

## 2023-06-19 NOTE — Progress Notes (Unsigned)
Telephone Visit- Progress Note: Referring Physician:  No referring provider defined for this encounter.  Primary Physician:  [facility]  This visit was performed via telephone.  Patient location: home Provider location: office  I spent a total of 10 minutes non-face-to-face activities for this visit on the date of this encounter including review of current clinical condition and response to treatment.    Patient has given verbal consent to this telephone visits and we reviewed the limitations of a telephone visit. Patient wishes to proceed.    Chief Complaint:  follow up  History of Present Illness: Heather Mahoney is a 57 y.o. female has a history of CAD, GERD, heart murmur, hiatal hernia, hyperlipidemia, HTN, MI, PAD, and DM.    Had vascular procedure (stenting of right SFA) with Dr. Wyn Quaker on 09/15/22.   Last seen by me on 05/07/23 for constant LBP with prolonged standing and walking. Right leg pain was improved.   Phone visit to review her lumbar MRI scan.   Her symptoms are about the same. She continues with constant LBP that is worse with prolonged standing and walking. She still has some right leg pain, but it is improved and intermittent- when she has the pain it is medial thigh to knee. She has chronic right leg pain to her ankle since her stent. No numbness, tingling in legs.    She is taking neurontin.    Still having issues with her hands- had trigger finger release on right and can't make a full fist. Still has trigger finger on left. Is seeing ortho for this.    She is on PLAVIX.    Bowel/Bladder Dysfunction: none   Conservative measures:  Physical therapy:  8 visits of PT for her lumbar spine from 03/10/23 to 04/28/23 Multimodal medical therapy including regular antiinflammatories: mobic, robaxin, neurontin  Injections: no epidural steroid injections   Past Surgery: None   Nia M Cousin has some balance issues and dexterity issues with her  hands likely due to trigger fingers.    The symptoms are causing a significant impact on the patient's life.   Exam: No exam done as this was a telephone encounter.     Imaging: Lumbar MRI scan dated 05/28/23:  FINDINGS: Segmentation:  Standard.   Alignment:  Physiologic.   Vertebrae: No fracture, evidence of discitis, or bone lesion. Degenerative endplate changes at L3-L4.   Conus medullaris and cauda equina: Conus extends to the L2 level. Conus and cauda equina appear normal.   Paraspinal and other soft tissues: Negative.   Disc levels:   T12-L1: Unremarkable   L1-L2: Mild bilateral facet degenerative change. No significant disc bulge. No spinal canal narrowing. No neural foraminal narrowing.   L2-L3: Mild bilateral facet degenerative change. Minimal disc bulge. No significant spinal canal narrowing. No neural foraminal narrowing.   L3-L4: Circumferential disc bulge. Mild bilateral facet degenerative change ligamentum flavum hypertrophy. Mild spinal canal narrowing. Mild bilateral neural foraminal narrowing.   L4-L5: Mild bilateral facet degenerative change with ligamentum flavum hypertrophy. No significant disc bulge. No spinal canal narrowing. No neural foraminal narrowing.   L5-S1: No significant disc bulge. No spinal canal narrowing. No neural foraminal narrowing.   IMPRESSION: Mild spinal canal narrowing and mild bilateral neural foraminal narrowing at L3-L4.     Electronically Signed   By: Lorenza Cambridge M.D.   On: 06/11/2023 09:42  I have personally reviewed the images and agree with the above interpretation.  Assessment and Plan: Ms. Lininger  is a pleasant 57 y.o. female who had improvement in pain with PT, but still has constant LBP with prolonged standing and walking. She still has some right leg pain, but it is improved. No numbness, tingling in legs.   She has known diffuse lumbar spondylosis with mild central and bilateral foraminal stenosis at  L3-L4. LBP is likely facet mediated. Some of right leg pain may be from foraminal stenosis L3-L4.    Treatment options discussed with patient and following plan made:    - Continue HEP from PT. She does not want to revisit.  - Discussed lumbar injections. She declines as she is afraid of needles.  - Continue current medications including neurontin from other providers.  - Of note, she is on PLAVIX.  - She was recently approved for disability. She is planning to move to [place] to be near her sister. She will f/u with me prn. She will get her images on CD to take with her.   Drake Leach PA-C Neurosurgery

## 2023-06-25 ENCOUNTER — Encounter: Payer: Self-pay | Admitting: Orthopedic Surgery

## 2023-06-25 ENCOUNTER — Ambulatory Visit (INDEPENDENT_AMBULATORY_CARE_PROVIDER_SITE_OTHER): Payer: Medicaid Other | Admitting: Orthopedic Surgery

## 2023-06-25 DIAGNOSIS — M48061 Spinal stenosis, lumbar region without neurogenic claudication: Secondary | ICD-10-CM | POA: Diagnosis not present

## 2023-06-25 DIAGNOSIS — M4726 Other spondylosis with radiculopathy, lumbar region: Secondary | ICD-10-CM

## 2023-06-25 DIAGNOSIS — M47816 Spondylosis without myelopathy or radiculopathy, lumbar region: Secondary | ICD-10-CM

## 2023-06-25 DIAGNOSIS — M5416 Radiculopathy, lumbar region: Secondary | ICD-10-CM

## 2023-07-15 ENCOUNTER — Other Ambulatory Visit (INDEPENDENT_AMBULATORY_CARE_PROVIDER_SITE_OTHER): Payer: Self-pay | Admitting: Nurse Practitioner

## 2023-07-15 DIAGNOSIS — I739 Peripheral vascular disease, unspecified: Secondary | ICD-10-CM

## 2023-07-15 DIAGNOSIS — I714 Abdominal aortic aneurysm, without rupture, unspecified: Secondary | ICD-10-CM

## 2023-07-19 NOTE — H&P (View-Only) (Signed)
Date:  07/20/2023   ID:  Heather Mahoney, Heather Mahoney 30-Jan-1966, MRN [id number]  Patient Location:  [address] [place] [address]   Provider location:   [facility], [employer] office  PCP:  [facility]  Cardiologist:  Mariah Milling, [facility]   Chief Complaint  Patient presents with   12 month follow up     Patient c/o mild angina & shortness of breath. Medications reviewed by the patient verbally.     History of Present Illness:    Heather Mahoney is a 57 y.o. female past medical history of past medical history of Smoker GERD HTN PVCs Fall and question of loss of consciousness 2017, secondary to orthostasis,  AAA with endovascular repair October 2019 CAD, stent x2 to RCA March 2020 Echocardiogram May 2020, EF 50 to 55% Endovascular repair of AAA Who presents today for follow-up of her chest pain/angina, shortness of breath, CAD  Last seen in clinic November 2023 Seen in the emergency room April 03, 2023 for chest pain Blood pressure was elevated Troponin negative, CTA without acute findings  "Having unstable angina", with walking/movement Worsening SOB and pain together Sometimes moving in the bed will get pain in the chest concerning for angina  Followed by Dr. Wyn Quaker for PAD AAA repaired with endograft denies claudication symptoms Lower extremity arterial Dopplers pending from vascular  Prior cardiac studies reviewed Echocardiogram May 2020, EF 50 to 55%  On Crestor Zetia, no recent lipid panel  Used to work at [employer],  Recently received disability  Stopped smoking >4 years ago   GERD symptoms at times, ran out of her omeprazole   EKG personally reviewed by myself on todays visit EKG Interpretation Date/Time:  Monday July 20 2023 14:06:26 EDT Ventricular Rate:  79 PR Interval:  204 QRS Duration:  86 QT Interval:  388 QTC Calculation: 444 R Axis:   23  Text Interpretation: Normal sinus rhythm Possible  Left atrial enlargement Nonspecific T wave abnormality When compared with ECG of 03-Apr-2023 19:35, Nonspecific T wave abnormality, worse in Inferior leads Inverted T waves have replaced nonspecific T wave abnormality in Lateral leads Confirmed by Julien Nordmann [phone]) on 07/20/2023 2:11:10 PM   Other past medical history reviewed Discussed prior cardiac catheterization, 60% lesion mid RCA   cardiac CTA showing stenosis in RCA by FFR   cardiac catheterization January 13, 2019   Cardiac cath 01/13/2019 Single-vessel disease notably of the RCA moderate to severe proximal  disease estimated 75% and distal RCA disease estimated at 80% There is disease of high OM vessel, small in caliber diffusely diseased Mild proximal LAD disease   --- stenting of the proximal RCA and distal RCA  subsequent left groin hematoma causing hypotension, vagal response Was kept for 2 days post procedure in the hospital Her   Medication intolerances  isosorbide  caused  headache    peripheral vascular catheterization in October 2019  AAA repair, endograft   Mother with bypass surgery Her grandmother, mother side, passed away at the age of 60 from a brain aneurysm.    Cardiac CTA cardiac CT scan was ordered showing severe stenosis mid RCA (FFR less than 0.7), indeterminate stenosis LAD and circumflex (FFR less than 0.8)    Past Medical History:  Diagnosis Date   CAD (coronary artery disease)    a. PCI to proximal and distal RCA 3/26   Diabetes mellitus without complication (HCC)    GERD (gastroesophageal reflux disease)  Heart murmur    MR   Hiatal hernia    Hot flashes    Hyperlipidemia    Hypertension    Myocardial infarction (HCC)    Neuropathy    Tobacco abuse    a. Started at age 63, quit during 3 pregnancies. b. 1 PPD for a 40 pack-year hx (2019)    Past Surgical History:  Procedure Laterality Date   ABDOMINAL AORTIC ANEURYSM REPAIR     2019   BREAST BIOPSY Right    Multiple  biopsies-all benign   BREAST SURGERY  1986   I&D for 'milk duct'   COLONOSCOPY WITH PROPOFOL N/A 07/07/2022   Procedure: COLONOSCOPY WITH PROPOFOL;  Surgeon: Wyline Mood, MD;  Location[facility];  Service: Gastroenterology;  Laterality: N/A;   CORONARY STENT INTERVENTION N/A 01/13/2019   Procedure: CORONARY STENT INTERVENTION;  Surgeon: Yvonne Kendall, MD;  Location: [facility] INVASIVE CV LAB;  Service: Cardiovascular;  Laterality: N/A;   EMBOLECTOMY  08/13/2018   Procedure: Right SFA and profunda femoris Fogarty embolectomy 3  Fogarty embolectomy balloon;  Surgeon: Annice Needy, MD;  Location: [facility] ORS;  Service: Vascular;;   ENDARTERECTOMY FEMORAL Right 08/13/2018   Procedure: Right common femoral, profunda femoris, and superficial femoral artery endarterectomies and patch angioplasty ;  Surgeon: Annice Needy, MD;  Location: [facility] ORS;  Service: Vascular;  Laterality: Right;   ENDOVASCULAR REPAIR/STENT GRAFT N/A 08/13/2018   Procedure: ENDOVASCULAR REPAIR/STENT GRAFT;  Surgeon: Renford Dills, MD;  Location: [facility] INVASIVE CV LAB;  Service: Cardiovascular;  Laterality: N/A;   LEFT HEART CATH AND CORONARY ANGIOGRAPHY Left 01/13/2019   Procedure: LEFT HEART CATH AND CORONARY ANGIOGRAPHY;  Surgeon: Antonieta Iba, MD;  Location: [facility] INVASIVE CV LAB;  Service: Cardiovascular;  Laterality: Left;   LOWER EXTREMITY ANGIOGRAPHY Right 09/08/2022   Procedure: Lower Extremity Angiography;  Surgeon: Annice Needy, MD;  Location: [facility] INVASIVE CV LAB;  Service: Cardiovascular;  Laterality: Right;   LOWER EXTREMITY ANGIOGRAPHY Right 09/15/2022   Procedure: Lower Extremity Angiography;  Surgeon: Annice Needy, MD;  Location: [facility] INVASIVE CV LAB;  Service: Cardiovascular;  Laterality: Right;   SALPINGOOPHORECTOMY Left 2000      Allergies:   Strawberry extract and Strawberry flavor   Social History   Tobacco Use   Smoking status: Former    Current packs/day: 0.00    Average packs/day: 0.3  packs/day for 38.0 years (9.5 ttl pk-yrs)    Types: Cigarettes    Start date: 08/10/1980    Quit date: 08/10/2018    Years since quitting: 4.9   Smokeless tobacco: Never  Vaping Use   Vaping status: Never Used  Substance Use Topics   Alcohol use: Not Currently    Comment: occassional,none last 24hrs   Drug use: Yes    Frequency: 2.0 times per week    Types: Marijuana    Comment: smokes Marijuana 'when I cant sleep, maybe once or twice a week'     Current Outpatient Medications on File Prior to Visit  Medication Sig Dispense Refill   amLODipine (NORVASC) 10 MG tablet Take 1 tablet (10 mg total) by mouth daily. 90 tablet 3   aspirin 81 MG EC tablet Take 1 tablet (81 mg total) by mouth daily. 90 tablet 3   cloNIDine (CATAPRES) 0.2 MG tablet Take 1 tablet (0.2 mg total) by mouth in the morning and at bedtime. Take an extra 0.1 mg for BP >160 180 tablet 3   clopidogrel (PLAVIX) 75 MG tablet Take 1  tablet (75 mg total) by mouth daily. 90 tablet 3   dapagliflozin propanediol (FARXIGA) 10 MG TABS tablet Take 10 mg by mouth daily.     ezetimibe (ZETIA) 10 MG tablet Take 1 tablet (10 mg total) by mouth daily. 90 tablet 4   gabapentin (NEURONTIN) 600 MG tablet Take 600 mg by mouth 3 (three) times daily as needed.     losartan (COZAAR) 100 MG tablet Take 1 tablet (100 mg total) by mouth daily. 30 tablet 2   metoprolol succinate (TOPROL-XL) 50 MG 24 hr tablet Take 1 tablet (50 mg total) by mouth daily. Take with or immediately following a meal. 90 tablet 3   rosuvastatin (CRESTOR) 40 MG tablet Take 1 tablet (40 mg total) by mouth daily. 90 tablet 3   venlafaxine (EFFEXOR) 75 MG tablet Take 75 mg by mouth 2 (two) times daily with a meal.     No current facility-administered medications on file prior to visit.     Family Hx: The patient's family history includes Asthma (age of onset: 36) in her mother; Breast cancer in her mother; Breast cancer (age of onset: 50) in her maternal grandmother;  Diabetes in her maternal grandmother and mother; Heart attack in her mother; Heart disease in her maternal grandmother and mother; Hyperlipidemia in her maternal grandmother and mother; Hypertension in her mother; Sarcoidosis in her mother.  ROS:   Please see the history of present illness.    Review of Systems  Constitutional: Negative.   HENT: Negative.    Respiratory:  Positive for shortness of breath.   Cardiovascular:  Positive for chest pain.  Gastrointestinal: Negative.   Neurological: Negative.   Psychiatric/Behavioral: Negative.    All other systems reviewed and are negative.    Labs/Other Tests and Data Reviewed:    Recent Labs: 04/03/2023: BUN 14; Creatinine, Ser 0.61; Hemoglobin 11.5; Platelets 438; Potassium 3.6; Sodium 136   Recent Lipid Panel Lab Results  Component Value Date/Time   CHOL 236 (H) 07/09/2020 06:03 PM   CHOL 266 (H) 12/06/2018 10:17 AM   TRIG 170 (H) 07/09/2020 06:03 PM   HDL 53 07/09/2020 06:03 PM   HDL 64 12/06/2018 10:17 AM   CHOLHDL 4.5 07/09/2020 06:03 PM   LDLCALC 149 (H) 07/09/2020 06:03 PM   LDLCALC 178 (H) 12/06/2018 10:17 AM   LDLDIRECT 185 (H) 12/06/2018 10:17 AM    Wt Readings from Last 3 Encounters:  07/20/23 167 lb 2 oz (75.8 kg)  05/07/23 168 lb (76.2 kg)  04/03/23 168 lb (76.2 kg)     Exam:    Vital Signs: Vital signs may also be detailed in the HPI BP 110/70 (BP Location: Left Arm, Patient Position: Sitting, Cuff Size: Normal)   Pulse 79   Ht 4\' 6"  (1.372 m)   Wt 167 lb 2 oz (75.8 kg)   SpO2 98%   BMI 40.30 kg/m   Constitutional:  oriented to person, place, and time. No distress.  HENT:  Head: Grossly normal Eyes:  no discharge. No scleral icterus.  Neck: No JVD, no carotid bruits  Cardiovascular: Regular rate and rhythm, no murmurs appreciated Pulmonary/Chest: Clear to auscultation bilaterally, no wheezes or rails Abdominal: Soft.  no distension.  no tenderness.  Musculoskeletal: Normal range of  motion Neurological:  normal muscle tone. Coordination normal. No atrophy Skin: Skin warm and dry Psychiatric: normal affect, pleasant  ASSESSMENT & PLAN:    Problem List Items Addressed This Visit       Cardiology Problems  Essential hypertension, benign   Relevant Orders   EKG 12-Lead (Completed)   AAA (abdominal aortic aneurysm) (HCC)   Relevant Orders   EKG 12-Lead (Completed)   PAD (peripheral artery disease) (HCC)   Relevant Orders   EKG 12-Lead (Completed)   Hypertension   Mixed hyperlipidemia   Coronary artery disease of native artery of native heart with stable angina pectoris (HCC)   Relevant Orders   EKG 12-Lead (Completed)   Mitral regurgitation     Other   Diabetes (HCC)   Other Visit Diagnoses     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)    -  Primary   Relevant Orders   EKG 12-Lead (Completed)      Coronary disease with stable angina Reports chest pain concerning for angina, often associated with shortness of breath Exertion is limited secondary to symptoms New EKG changes today, T wave inversions inferior and lateral leads Prior cardiac images reviewed, residual left circumflex and RCA disease Given the severity of her symptoms, recommended further evaluation Suggested cardiac catheterization given prior stenting I have reviewed the risks, indications, and alternatives to cardiac catheterization, possible angioplasty, and stenting with the patient. Risks include but are not limited to bleeding, infection, vascular injury, stroke, myocardial infection, arrhythmia, kidney injury, radiation-related injury in the case of prolonged fluoroscopy use, emergency cardiac surgery, and death. The patient understands the risks of serious complication is 1-2 in 1000 with diagnostic cardiac cath and 1-2% or less with angioplasty/stenting.  She is willing to proceed Scheduled for October 4  Hyperlipidemia Continue Crestor and  Zetia Lipid panel ordered  Essential hypertension Blood pressure is well controlled on today's visit. No changes made to the medications.  PAD Followed by vascular, History of AAA endovascular repair, denies claudication symptoms  Neuropathy  takes gabapentin for neuropathic pain  GERD As she is on Plavix, Recommend she start pantoprazole 40 daily, will stop omeprazole    Total encounter time more than 40 minutes  Greater than 50% was spent in counseling and coordination of care with the patient    Signed, Julien Nordmann, MD  [facility] [address], [place], [place] [address]

## 2023-07-19 NOTE — Progress Notes (Unsigned)
Date:  07/20/2023   ID:  Heather Mahoney, Heather Mahoney [birth year], MRN [id number]  Patient Location:  [address] [place] [address]   Provider location:   [facility], [employer] office  PCP:  [facility]  Cardiologist:  Mariah Milling, [facility]   Chief Complaint  Patient presents with   12 month follow up     Patient c/o mild angina & shortness of breath. Medications reviewed by the patient verbally.     History of Present Illness:    Heather Mahoney is a 57 y.o. female past medical history of past medical history of Smoker GERD HTN PVCs Fall and question of loss of consciousness 2017, secondary to orthostasis,  AAA with endovascular repair October 2019 CAD, stent x2 to RCA March 2020 Echocardiogram May 2020, EF 50 to 55% Endovascular repair of AAA Who presents today for follow-up of her chest pain/angina, shortness of breath, CAD  Last seen in clinic November 2023 Seen in the emergency room April 03, 2023 for chest pain Blood pressure was elevated Troponin negative, CTA without acute findings  "Having unstable angina", with walking/movement Worsening SOB and pain together Sometimes moving in the bed will get pain in the chest concerning for angina  Followed by Dr. Wyn Quaker for PAD AAA repaired with endograft denies claudication symptoms Lower extremity arterial Dopplers pending from vascular  Prior cardiac studies reviewed Echocardiogram May 2020, EF 50 to 55%  On Crestor Zetia, no recent lipid panel  Used to work at [employer],  Recently received disability  Stopped smoking >4 years ago   GERD symptoms at times, ran out of her omeprazole   EKG personally reviewed by myself on todays visit EKG Interpretation Date/Time:  Monday July 20 2023 14:06:26 EDT Ventricular Rate:  79 PR Interval:  204 QRS Duration:  86 QT Interval:  388 QTC Calculation: 444 R Axis:   23  Text Interpretation: Normal sinus rhythm Possible  Left atrial enlargement Nonspecific T wave abnormality When compared with ECG of 03-Apr-2023 19:35, Nonspecific T wave abnormality, worse in Inferior leads Inverted T waves have replaced nonspecific T wave abnormality in Lateral leads Confirmed by Julien Nordmann [phone]) on 07/20/2023 2:11:10 PM   Other past medical history reviewed Discussed prior cardiac catheterization, 60% lesion mid RCA   cardiac CTA showing stenosis in RCA by FFR   cardiac catheterization January 13, 2019   Cardiac cath 01/13/2019 Single-vessel disease notably of the RCA moderate to severe proximal  disease estimated 75% and distal RCA disease estimated at 80% There is disease of high OM vessel, small in caliber diffusely diseased Mild proximal LAD disease   --- stenting of the proximal RCA and distal RCA  subsequent left groin hematoma causing hypotension, vagal response Was kept for 2 days post procedure in the hospital Her   Medication intolerances  isosorbide  caused  headache    peripheral vascular catheterization in October 2019  AAA repair, endograft   Mother with bypass surgery Her grandmother, mother side, passed away at the age of 84 from a brain aneurysm.    Cardiac CTA cardiac CT scan was ordered showing severe stenosis mid RCA (FFR less than 0.7), indeterminate stenosis LAD and circumflex (FFR less than 0.8)    Past Medical History:  Diagnosis Date   CAD (coronary artery disease)    a. PCI to proximal and distal RCA 3/26   Diabetes mellitus without complication (HCC)    GERD (gastroesophageal reflux disease)  Heart murmur    MR   Hiatal hernia    Hot flashes    Hyperlipidemia    Hypertension    Myocardial infarction (HCC)    Neuropathy    Tobacco abuse    a. Started at age 34, quit during 3 pregnancies. b. 1 PPD for a 40 pack-year hx (2019)    Past Surgical History:  Procedure Laterality Date   ABDOMINAL AORTIC ANEURYSM REPAIR     2019   BREAST BIOPSY Right    Multiple  biopsies-all benign   BREAST SURGERY  1986   I&D for 'milk duct'   COLONOSCOPY WITH PROPOFOL N/A 07/07/2022   Procedure: COLONOSCOPY WITH PROPOFOL;  Surgeon: Wyline Mood, MD;  Location: [facility] ENDOSCOPY;  Service: Gastroenterology;  Laterality: N/A;   CORONARY STENT INTERVENTION N/A 01/13/2019   Procedure: CORONARY STENT INTERVENTION;  Surgeon: Yvonne Kendall, MD;  Location: [facility] INVASIVE CV LAB;  Service: Cardiovascular;  Laterality: N/A;   EMBOLECTOMY  08/13/2018   Procedure: Right SFA and profunda femoris Fogarty embolectomy 3  Fogarty embolectomy balloon;  Surgeon: Annice Needy, MD;  Location: [facility] ORS;  Service: Vascular;;   ENDARTERECTOMY FEMORAL Right 08/13/2018   Procedure: Right common femoral, profunda femoris, and superficial femoral artery endarterectomies and patch angioplasty ;  Surgeon: Annice Needy, MD;  Location: [facility] ORS;  Service: Vascular;  Laterality: Right;   ENDOVASCULAR REPAIR/STENT GRAFT N/A 08/13/2018   Procedure: ENDOVASCULAR REPAIR/STENT GRAFT;  Surgeon: Renford Dills, MD;  Location: [facility] INVASIVE CV LAB;  Service: Cardiovascular;  Laterality: N/A;   LEFT HEART CATH AND CORONARY ANGIOGRAPHY Left 01/13/2019   Procedure: LEFT HEART CATH AND CORONARY ANGIOGRAPHY;  Surgeon: Antonieta Iba, MD;  Location: [facility] INVASIVE CV LAB;  Service: Cardiovascular;  Laterality: Left;   LOWER EXTREMITY ANGIOGRAPHY Right 09/08/2022   Procedure: Lower Extremity Angiography;  Surgeon: Annice Needy, MD;  Location: [facility] INVASIVE CV LAB;  Service: Cardiovascular;  Laterality: Right;   LOWER EXTREMITY ANGIOGRAPHY Right 09/15/2022   Procedure: Lower Extremity Angiography;  Surgeon: Annice Needy, MD;  Location: [facility] INVASIVE CV LAB;  Service: Cardiovascular;  Laterality: Right;   SALPINGOOPHORECTOMY Left 2000      Allergies:   Strawberry extract and Strawberry flavor   Social History   Tobacco Use   Smoking status: Former    Current packs/day: 0.00    Average packs/day: 0.3  packs/day for 38.0 years (9.5 ttl pk-yrs)    Types: Cigarettes    Start date: 08/10/1980    Quit date: 08/10/2018    Years since quitting: 4.9   Smokeless tobacco: Never  Vaping Use   Vaping status: Never Used  Substance Use Topics   Alcohol use: Not Currently    Comment: occassional,none last 24hrs   Drug use: Yes    Frequency: 2.0 times per week    Types: Marijuana    Comment: smokes Marijuana 'when I cant sleep, maybe once or twice a week'     Current Outpatient Medications on File Prior to Visit  Medication Sig Dispense Refill   amLODipine (NORVASC) 10 MG tablet Take 1 tablet (10 mg total) by mouth daily. 90 tablet 3   aspirin 81 MG EC tablet Take 1 tablet (81 mg total) by mouth daily. 90 tablet 3   cloNIDine (CATAPRES) 0.2 MG tablet Take 1 tablet (0.2 mg total) by mouth in the morning and at bedtime. Take an extra 0.1 mg for BP >160 180 tablet 3   clopidogrel (PLAVIX) 75 MG tablet Take 1  tablet (75 mg total) by mouth daily. 90 tablet 3   dapagliflozin propanediol (FARXIGA) 10 MG TABS tablet Take 10 mg by mouth daily.     ezetimibe (ZETIA) 10 MG tablet Take 1 tablet (10 mg total) by mouth daily. 90 tablet 4   gabapentin (NEURONTIN) 600 MG tablet Take 600 mg by mouth 3 (three) times daily as needed.     losartan (COZAAR) 100 MG tablet Take 1 tablet (100 mg total) by mouth daily. 30 tablet 2   metoprolol succinate (TOPROL-XL) 50 MG 24 hr tablet Take 1 tablet (50 mg total) by mouth daily. Take with or immediately following a meal. 90 tablet 3   rosuvastatin (CRESTOR) 40 MG tablet Take 1 tablet (40 mg total) by mouth daily. 90 tablet 3   venlafaxine (EFFEXOR) 75 MG tablet Take 75 mg by mouth 2 (two) times daily with a meal.     No current facility-administered medications on file prior to visit.     Family Hx: The patient's family history includes Asthma (age of onset: 73) in her mother; Breast cancer in her mother; Breast cancer (age of onset: 33) in her maternal grandmother;  Diabetes in her maternal grandmother and mother; Heart attack in her mother; Heart disease in her maternal grandmother and mother; Hyperlipidemia in her maternal grandmother and mother; Hypertension in her mother; Sarcoidosis in her mother.  ROS:   Please see the history of present illness.    Review of Systems  Constitutional: Negative.   HENT: Negative.    Respiratory:  Positive for shortness of breath.   Cardiovascular:  Positive for chest pain.  Gastrointestinal: Negative.   Neurological: Negative.   Psychiatric/Behavioral: Negative.    All other systems reviewed and are negative.    Labs/Other Tests and Data Reviewed:    Recent Labs: 04/03/2023: BUN 14; Creatinine, Ser 0.61; Hemoglobin 11.5; Platelets 438; Potassium 3.6; Sodium 136   Recent Lipid Panel Lab Results  Component Value Date/Time   CHOL 236 (H) 07/09/2020 06:03 PM   CHOL 266 (H) 12/06/2018 10:17 AM   TRIG 170 (H) 07/09/2020 06:03 PM   HDL 53 07/09/2020 06:03 PM   HDL 64 12/06/2018 10:17 AM   CHOLHDL 4.5 07/09/2020 06:03 PM   LDLCALC 149 (H) 07/09/2020 06:03 PM   LDLCALC 178 (H) 12/06/2018 10:17 AM   LDLDIRECT 185 (H) 12/06/2018 10:17 AM    Wt Readings from Last 3 Encounters:  07/20/23 167 lb 2 oz (75.8 kg)  05/07/23 168 lb (76.2 kg)  04/03/23 168 lb (76.2 kg)     Exam:    Vital Signs: Vital signs may also be detailed in the HPI BP 110/70 (BP Location: Left Arm, Patient Position: Sitting, Cuff Size: Normal)   Pulse 79   Ht 4\' 6"  (1.372 m)   Wt 167 lb 2 oz (75.8 kg)   SpO2 98%   BMI 40.30 kg/m   Constitutional:  oriented to person, place, and time. No distress.  HENT:  Head: Grossly normal Eyes:  no discharge. No scleral icterus.  Neck: No JVD, no carotid bruits  Cardiovascular: Regular rate and rhythm, no murmurs appreciated Pulmonary/Chest: Clear to auscultation bilaterally, no wheezes or rails Abdominal: Soft.  no distension.  no tenderness.  Musculoskeletal: Normal range of  motion Neurological:  normal muscle tone. Coordination normal. No atrophy Skin: Skin warm and dry Psychiatric: normal affect, pleasant  ASSESSMENT & PLAN:    Problem List Items Addressed This Visit       Cardiology Problems  Essential hypertension, benign   Relevant Orders   EKG 12-Lead (Completed)   AAA (abdominal aortic aneurysm) (HCC)   Relevant Orders   EKG 12-Lead (Completed)   PAD (peripheral artery disease) (HCC)   Relevant Orders   EKG 12-Lead (Completed)   Hypertension   Mixed hyperlipidemia   Coronary artery disease of native artery of native heart with stable angina pectoris (HCC)   Relevant Orders   EKG 12-Lead (Completed)   Mitral regurgitation     Other   Diabetes (HCC)   Other Visit Diagnoses     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)    -  Primary   Relevant Orders   EKG 12-Lead (Completed)      Coronary disease with stable angina Reports chest pain concerning for angina, often associated with shortness of breath Exertion is limited secondary to symptoms New EKG changes today, T wave inversions inferior and lateral leads Prior cardiac images reviewed, residual left circumflex and RCA disease Given the severity of her symptoms, recommended further evaluation Suggested cardiac catheterization given prior stenting I have reviewed the risks, indications, and alternatives to cardiac catheterization, possible angioplasty, and stenting with the patient. Risks include but are not limited to bleeding, infection, vascular injury, stroke, myocardial infection, arrhythmia, kidney injury, radiation-related injury in the case of prolonged fluoroscopy use, emergency cardiac surgery, and death. The patient understands the risks of serious complication is 1-2 in 1000 with diagnostic cardiac cath and 1-2% or less with angioplasty/stenting.  She is willing to proceed Scheduled for October 4  Hyperlipidemia Continue Crestor and  Zetia Lipid panel ordered  Essential hypertension Blood pressure is well controlled on today's visit. No changes made to the medications.  PAD Followed by vascular, History of AAA endovascular repair, denies claudication symptoms  Neuropathy  takes gabapentin for neuropathic pain  GERD As she is on Plavix, Recommend she start pantoprazole 40 daily, will stop omeprazole    Total encounter time more than 40 minutes  Greater than 50% was spent in counseling and coordination of care with the patient    Signed, Julien Nordmann, MD  [facility] [address], [place], [place] [address]

## 2023-07-20 ENCOUNTER — Ambulatory Visit (INDEPENDENT_AMBULATORY_CARE_PROVIDER_SITE_OTHER): Payer: Medicaid Other

## 2023-07-20 ENCOUNTER — Encounter: Payer: Self-pay | Admitting: Cardiovascular Disease

## 2023-07-20 ENCOUNTER — Encounter (INDEPENDENT_AMBULATORY_CARE_PROVIDER_SITE_OTHER): Payer: Self-pay | Admitting: Nurse Practitioner

## 2023-07-20 ENCOUNTER — Ambulatory Visit: Payer: Medicaid Other | Attending: Cardiovascular Disease | Admitting: Cardiovascular Disease

## 2023-07-20 ENCOUNTER — Other Ambulatory Visit: Payer: Self-pay | Admitting: Cardiovascular Disease

## 2023-07-20 ENCOUNTER — Ambulatory Visit (INDEPENDENT_AMBULATORY_CARE_PROVIDER_SITE_OTHER): Payer: Medicaid Other | Admitting: Nurse Practitioner

## 2023-07-20 VITALS — BP 110/70 | HR 79 | Ht <= 58 in | Wt 167.1 lb

## 2023-07-20 VITALS — BP 163/107 | HR 61 | Resp 16

## 2023-07-20 DIAGNOSIS — M545 Low back pain, unspecified: Secondary | ICD-10-CM | POA: Diagnosis not present

## 2023-07-20 DIAGNOSIS — I200 Unstable angina: Secondary | ICD-10-CM

## 2023-07-20 DIAGNOSIS — I10 Essential (primary) hypertension: Secondary | ICD-10-CM

## 2023-07-20 DIAGNOSIS — E1159 Type 2 diabetes mellitus with other circulatory complications: Secondary | ICD-10-CM | POA: Diagnosis not present

## 2023-07-20 DIAGNOSIS — I25118 Atherosclerotic heart disease of native coronary artery with other forms of angina pectoris: Secondary | ICD-10-CM

## 2023-07-20 DIAGNOSIS — I739 Peripheral vascular disease, unspecified: Secondary | ICD-10-CM

## 2023-07-20 DIAGNOSIS — I714 Abdominal aortic aneurysm, without rupture, unspecified: Secondary | ICD-10-CM

## 2023-07-20 DIAGNOSIS — Z9889 Other specified postprocedural states: Secondary | ICD-10-CM

## 2023-07-20 DIAGNOSIS — E782 Mixed hyperlipidemia: Secondary | ICD-10-CM | POA: Diagnosis not present

## 2023-07-20 DIAGNOSIS — I70219 Atherosclerosis of native arteries of extremities with intermittent claudication, unspecified extremity: Secondary | ICD-10-CM

## 2023-07-20 DIAGNOSIS — I340 Nonrheumatic mitral (valve) insufficiency: Secondary | ICD-10-CM

## 2023-07-20 DIAGNOSIS — G8929 Other chronic pain: Secondary | ICD-10-CM

## 2023-07-20 DIAGNOSIS — Z79899 Other long term (current) drug therapy: Secondary | ICD-10-CM

## 2023-07-20 MED ORDER — PANTOPRAZOLE SODIUM 40 MG PO TBEC: 40.0000 mg | DELAYED_RELEASE_TABLET | Freq: Every day | ORAL | 3 refills | Status: AC

## 2023-07-20 NOTE — Patient Instructions (Addendum)
    Medication Instructions:  Stop omepazole Start pantoprazole 40 mg daily  If you need a refill on your cardiac medications before your next appointment, please call your pharmacy.   Lab work: Your provider would like for you to have following labs drawn today Lipid, CBC and BMET.    Testing/Procedures:  [place] [facility] [place] [address] [place] [address] Dept: [phone] Loc: [phone]  Heather Mahoney  07/20/2023  You are scheduled for a Cardiac Catheterization on Friday, October 4 with Dr. Kirke Corin.   1. Please arrive at the [facility] Entrance of [facility], [address], [place] [address] at 9:00 am (This is 1 hour(s) prior to your procedure time).  Proceed to the Check-In Desk directly inside the entrance.  Procedure Parking: Use the entrance off of the [address] side of the hospital. Turn right upon entering and follow the driveway to parking that is directly in front of the [facility] Vascular Center. There is no valet parking available at this entrance, however there is an awning directly in front of the Heart & Vascular Center for drop off/ pick up for patients.  Special note: Every effort is made to have your procedure done on time. Please understand that emergencies sometimes delay scheduled procedures.  2. Diet: Do not eat solid foods after midnight.  The patient may have clear liquids until 5am upon the day of the procedure.  4. Medication instructions in preparation for your procedure:   Contrast Allergy: No   Hold Farxiga 10 MG that morning of procedure.  On the morning of your procedure, take your Aspirin 81 mg and Plavix/Clopidogrel and any morning medicines NOT listed above.  You may use sips of water.  5. Plan to go home the same day, you will only stay overnight if medically necessary. 6. Bring a current list of your medications  and current insurance cards. 7. You MUST have a responsible person to drive you home. 8. Someone MUST be with you the first 24 hours after you arrive home or your discharge will be delayed. 9. Please wear clothes that are easy to get on and off and wear slip-on shoes.  Thank you for allowing [place] to care for you!   -- [place] [facility]   Follow-Up: At [facility], you and your health needs are our priority.  As part of our continuing mission to provide you with exceptional heart care, we have created designated Provider Care Teams.  These Care Teams include your primary Cardiologist (physician) and Advanced Practice Providers (APPs -  Physician Assistants and Nurse Practitioners) who all work together to provide you with the care you need, when you need it.  You will need a follow up appointment in 4 weeks   Providers on your designated Care Team:   Nicolasa Ducking, NP Eula Listen, PA-C Cadence Fransico Michael, [place]  COVID-19 Vaccine Information can be found at: [email or web address] For questions related to vaccine distribution or appointments, please email [email or web address] or call [phone].

## 2023-07-20 NOTE — Progress Notes (Signed)
Subjective:    Patient ID: Heather Mahoney, female    DOB: 11-07-1965, 57 y.o.   MRN: [id number] Chief Complaint  Patient presents with   Follow-up    Ultrasound follow up    The patient returns to the office for followup and review of the noninvasive studies of her peripheral arterial disease as well as her endovascular aortic aneurysm repair.  The patient's EVAR was done in 2019 with her most recent intervention on her lower extremity in November 2023  There have been no interval changes in lower extremity symptoms. No interval shortening of the patient's claudication distance or development of rest pain symptoms. No new ulcers or wounds have occurred since the last visit.  She does have difficulty with walking however this is related to her known lower back issues.  She recently met with neurosurgery and she was offered steroid injections however at this time she elected not to move forward with those.  She notes that she will be relocating in the next few months to [place].  There have been no significant changes to the patient's overall health care.  The patient denies amaurosis fugax or recent TIA symptoms. There are no documented recent neurological changes noted. There is no history of DVT, PE or superficial thrombophlebitis. The patient denies recent episodes of angina or shortness of breath.   ABI Rt=1.04 and Lt=1.06  (previous ABI's Rt=1.04 and Lt=1.09) Duplex ultrasound of the bilateral tibial vessels shows multiphasic waveforms with good toe waveforms.  Right lower extremity arterial duplex shows widely patent stent with primarily triphasic waveforms throughout the lower extremity with biphasic waveforms of the distal tibial vessels.  The patient's endovascular aortic aneurysm repair shows no evidence of endoleak.  The maximal diameter is measured at 3 cm today.    Review of Systems  Cardiovascular:  Negative for leg swelling.  Musculoskeletal:  Positive for  arthralgias and back pain.  All other systems reviewed and are negative.      Objective:   Physical Exam Vitals reviewed.  HENT:     Head: Normocephalic.  Cardiovascular:     Rate and Rhythm: Normal rate.     Pulses:          Dorsalis pedis pulses are detected w/ Doppler on the right side and detected w/ Doppler on the left side.       Posterior tibial pulses are detected w/ Doppler on the right side and detected w/ Doppler on the left side.  Pulmonary:     Effort: Pulmonary effort is normal.  Skin:    General: Skin is warm and dry.  Neurological:     Mental Status: She is alert and oriented to person, place, and time.  Psychiatric:        Mood and Affect: Mood normal.        Behavior: Behavior normal.        Thought Content: Thought content normal.        Judgment: Judgment normal.     BP (!) 163/107 (BP Location: Right Arm)   Pulse 61   Resp 16   Past Medical History:  Diagnosis Date   CAD (coronary artery disease)    a. PCI to proximal and distal RCA 3/26   Diabetes mellitus without complication (HCC)    GERD (gastroesophageal reflux disease)    Heart murmur    MR   Hiatal hernia    Hot flashes    Hyperlipidemia    Hypertension    Myocardial infarction (  HCC)    Neuropathy    Tobacco abuse    a. Started at age 58, quit during 3 pregnancies. b. 1 PPD for a 40 pack-year hx (2019)     Social History   Socioeconomic History   Marital status: Single    Spouse name: Not on file   Number of children: 3   Years of education: Not on file   Highest education level: Not on file  Occupational History   Not on file  Tobacco Use   Smoking status: Former    Current packs/day: 0.00    Average packs/day: 0.3 packs/day for 38.0 years (9.5 ttl pk-yrs)    Types: Cigarettes    Start date: 08/10/1980    Quit date: 08/10/2018    Years since quitting: 4.9   Smokeless tobacco: Never  Vaping Use   Vaping status: Never Used  Substance and Sexual Activity   Alcohol use:  Not Currently    Comment: occassional,none last 24hrs   Drug use: Yes    Frequency: 2.0 times per week    Types: Marijuana    Comment: smokes Marijuana 'when I cant sleep, maybe once or twice a week'   Sexual activity: Yes    Birth control/protection: Post-menopausal  Other Topics Concern   Not on file  Social History Narrative   Lives with youngest daughter Joslyn Devon. Son lives in [place]   Social Determinants of Health   Financial Resource Strain: Not on file  Food Insecurity: Not on file  Transportation Needs: Not on file  Physical Activity: Not on file  Stress: Not on file  Social Connections: Not on file  Intimate Partner Violence: Not At Risk (05/23/2021)   Humiliation, Afraid, Rape, and Kick questionnaire    Fear of Current or Ex-Partner: No    Emotionally Abused: No    Physically Abused: No    Sexually Abused: No    Past Surgical History:  Procedure Laterality Date   ABDOMINAL AORTIC ANEURYSM REPAIR     2019   BREAST BIOPSY Right    Multiple biopsies-all benign   BREAST SURGERY  1986   I&D for 'milk duct'   COLONOSCOPY WITH PROPOFOL N/A 07/07/2022   Procedure: COLONOSCOPY WITH PROPOFOL;  Surgeon: Wyline Mood, MD;  Location[facility] ENDOSCOPY;  Service: Gastroenterology;  Laterality: N/A;   CORONARY STENT INTERVENTION N/A 01/13/2019   Procedure: CORONARY STENT INTERVENTION;  Surgeon: Yvonne Kendall, MD;  Location: [facility] INVASIVE CV LAB;  Service: Cardiovascular;  Laterality: N/A;   EMBOLECTOMY  08/13/2018   Procedure: Right SFA and profunda femoris Fogarty embolectomy 3  Fogarty embolectomy balloon;  Surgeon: Annice Needy, MD;  Location: [facility] ORS;  Service: Vascular;;   ENDARTERECTOMY FEMORAL Right 08/13/2018   Procedure: Right common femoral, profunda femoris, and superficial femoral artery endarterectomies and patch angioplasty ;  Surgeon: Annice Needy, MD;  Location: [facility] ORS;  Service: Vascular;  Laterality: Right;   ENDOVASCULAR REPAIR/STENT GRAFT N/A 08/13/2018    Procedure: ENDOVASCULAR REPAIR/STENT GRAFT;  Surgeon: Renford Dills, MD;  Location: [facility] INVASIVE CV LAB;  Service: Cardiovascular;  Laterality: N/A;   LEFT HEART CATH AND CORONARY ANGIOGRAPHY Left 01/13/2019   Procedure: LEFT HEART CATH AND CORONARY ANGIOGRAPHY;  Surgeon: Antonieta Iba, MD;  Location: [facility] INVASIVE CV LAB;  Service: Cardiovascular;  Laterality: Left;   LOWER EXTREMITY ANGIOGRAPHY Right 09/08/2022   Procedure: Lower Extremity Angiography;  Surgeon: Annice Needy, MD;  Location: [facility] INVASIVE CV LAB;  Service: Cardiovascular;  Laterality: Right;   LOWER  EXTREMITY ANGIOGRAPHY Right 09/15/2022   Procedure: Lower Extremity Angiography;  Surgeon: Annice Needy, MD;  Location: [facility] INVASIVE CV LAB;  Service: Cardiovascular;  Laterality: Right;   SALPINGOOPHORECTOMY Left 2000    Family History  Problem Relation Age of Onset   Breast cancer Mother    Diabetes Mother    Hypertension Mother    Hyperlipidemia Mother    Heart disease Mother        CABG X 4   Asthma Mother 18   Sarcoidosis Mother        In remission   Heart attack Mother    Breast cancer Maternal Grandmother 73   Diabetes Maternal Grandmother    Heart disease Maternal Grandmother    Hyperlipidemia Maternal Grandmother     Allergies  Allergen Reactions   Strawberry Extract Hives and Swelling   Strawberry Flavor Rash       Latest Ref Rng & Units 04/03/2023    7:38 PM 02/24/2022   11:37 AM 11/07/2021    9:14 AM  CBC  WBC 4.0 - 10.5 K/uL 13.7  13.4  11.5   Hemoglobin 12.0 - 15.0 g/dL 95.1  88.4  16.6   Hematocrit 36.0 - 46.0 % 35.8  37.9  38.1   Platelets 150 - 400 K/uL 438  469  465       CMP     Component Value Date/Time   NA 136 04/03/2023 1938   NA 143 01/07/2019 1153   K 3.6 04/03/2023 1938   CL 104 04/03/2023 1938   CO2 21 (L) 04/03/2023 1938   GLUCOSE 93 04/03/2023 1938   BUN 14 04/03/2023 1938   BUN 10 01/07/2019 1153   CREATININE 0.61 04/03/2023 1938   CALCIUM 8.9 04/03/2023  1938   PROT 8.5 (H) 02/24/2022 1137   ALBUMIN 4.7 02/24/2022 1137   AST 25 02/24/2022 1137   ALT 22 02/24/2022 1137   ALKPHOS 99 02/24/2022 1137   BILITOT 0.8 02/24/2022 1137   GFRNONAA >60 04/03/2023 1938     No results found.     Assessment & Plan:   1. Abdominal aortic aneurysm (AAA) without rupture, unspecified part (HCC) Recommend:  Patient is status post successful endovascular repair of the AAA.   No further intervention is required at this time.   No endoleak is detected and the aneurysm sac is stable.  The patient will continue antiplatelet therapy as prescribed as well as aggressive management of hyperlipidemia. Exercise is again strongly encouraged.   However, endografts require continued surveillance with ultrasound or CT scan. This is mandatory to detect any changes that allow repressurization of the aneurysm sac.  The patient is informed that this would be asymptomatic.  The patient is reminded that lifelong routine surveillance is a necessity with an endograft.  Patient is established with a cardiologist which can follow in [place].  Due to her upcoming relocation we will see her on an as-needed basis.  2. Peripheral arterial disease with history of revascularization (HCC)  Recommend:  The patient has evidence of atherosclerosis of the lower extremities with no claudication.  The patient does not voice lifestyle limiting changes at this point in time.  Noninvasive studies do not suggest clinically significant change.  No invasive studies, angiography or surgery at this time  The patient is advised that she can hold her Plavix in November when she is 1 year out from intervention.  Continued surveillance is indicated as atherosclerosis is likely to progress with time.  Currently the patient  has a cardiologist established in [place].  She can follow with peripheral arterial disease with their offices.  Based on her upcoming movement we will follow-up with her  as needed.  3. Chronic midline low back pain, unspecified whether sciatica present The patient does have known lower back issues.  She has established a PCP in [place].  She can follow-up with PCP for ongoing lower back issues.  4. Type 2 diabetes mellitus with other circulatory complication, without long-term current use of insulin (HCC) Continue hypoglycemic medications as already ordered, these medications have been reviewed and there are no changes at this time.  Hgb A1C to be monitored as already arranged by primary service  5. Essential hypertension, benign Continue antihypertensive medications as already ordered, these medications have been reviewed and there are no changes at this time.   Current Outpatient Medications on File Prior to Visit  Medication Sig Dispense Refill   amLODipine (NORVASC) 10 MG tablet Take 1 tablet (10 mg total) by mouth daily. 90 tablet 3   aspirin 81 MG EC tablet Take 1 tablet (81 mg total) by mouth daily. 90 tablet 3   cloNIDine (CATAPRES) 0.2 MG tablet Take 1 tablet (0.2 mg total) by mouth in the morning and at bedtime. Take an extra 0.1 mg for BP >160 180 tablet 3   clopidogrel (PLAVIX) 75 MG tablet Take 1 tablet (75 mg total) by mouth daily. 90 tablet 3   dapagliflozin propanediol (FARXIGA) 10 MG TABS tablet Take 10 mg by mouth daily.     ezetimibe (ZETIA) 10 MG tablet Take 1 tablet (10 mg total) by mouth daily. 90 tablet 4   gabapentin (NEURONTIN) 600 MG tablet Take 600 mg by mouth 3 (three) times daily as needed.     losartan (COZAAR) 100 MG tablet Take 1 tablet (100 mg total) by mouth daily. 30 tablet 2   metoprolol succinate (TOPROL-XL) 50 MG 24 hr tablet Take 1 tablet (50 mg total) by mouth daily. Take with or immediately following a meal. 90 tablet 3   omeprazole (PRILOSEC) 20 MG capsule Take 1 capsule (20 mg total) by mouth 2 (two) times daily before a meal. 60 capsule 1   rosuvastatin (CRESTOR) 40 MG tablet Take 1 tablet (40 mg total) by mouth  daily. 90 tablet 3   venlafaxine (EFFEXOR) 75 MG tablet Take 75 mg by mouth 2 (two) times daily with a meal.     No current facility-administered medications on file prior to visit.    There are no Patient Instructions on file for this visit. No follow-ups on file.   Georgiana Spinner, NP

## 2023-07-21 LAB — BASIC METABOLIC PANEL
BUN/Creatinine Ratio: 12 (ref 9–23)
BUN: 13 mg/dL (ref 6–24)
CO2: 23 mmol/L (ref 20–29)
Calcium: 9.7 mg/dL (ref 8.7–10.2)
Chloride: 103 mmol/L (ref 96–106)
Creatinine, Ser: 1.09 mg/dL — ABNORMAL HIGH (ref 0.57–1.00)
Glucose: 107 mg/dL — ABNORMAL HIGH (ref 70–99)
Potassium: 4.6 mmol/L (ref 3.5–5.2)
Sodium: 141 mmol/L (ref 134–144)
eGFR: 59 mL/min/{1.73_m2} — ABNORMAL LOW (ref >59)

## 2023-07-21 LAB — LIPID PANEL
Chol/HDL Ratio: 7 {ratio} — ABNORMAL HIGH (ref 0.0–4.4)
Cholesterol, Total: 321 mg/dL — ABNORMAL HIGH (ref 100–199)
HDL: 46 mg/dL (ref >39)
LDL Chol Calc (NIH): 238 mg/dL — ABNORMAL HIGH (ref 0–99)
Triglycerides: 189 mg/dL — ABNORMAL HIGH (ref 0–149)
VLDL Cholesterol Cal: 37 mg/dL (ref 5–40)

## 2023-07-21 LAB — CBC
Hematocrit: 36.9 % (ref 34.0–46.6)
Hemoglobin: 11.5 g/dL (ref 11.1–15.9)
MCH: 26.6 pg (ref 26.6–33.0)
MCHC: 31.2 g/dL — ABNORMAL LOW (ref 31.5–35.7)
MCV: 85 fL (ref 79–97)
Platelets: 483 10*3/uL — ABNORMAL HIGH (ref 150–450)
RBC: 4.33 x10E6/uL (ref 3.77–5.28)
RDW: 16.1 % — ABNORMAL HIGH (ref 11.7–15.4)
WBC: 11.4 10*3/uL — ABNORMAL HIGH (ref 3.4–10.8)

## 2023-07-21 LAB — VAS US ABI WITH/WO TBI
Left ABI: 1.06
Right ABI: 1.04

## 2023-07-23 ENCOUNTER — Telehealth: Payer: Self-pay | Admitting: *Deleted

## 2023-07-23 NOTE — Telephone Encounter (Signed)
Spoke with patient and reviewed instructions. She verbalized understanding and confirmed time for tomorrow's procedure. She had no further questions at this time.

## 2023-07-24 ENCOUNTER — Ambulatory Visit
Admission: RE | Admit: 2023-07-24 | Discharge: 2023-07-24 | Disposition: A | Discharge status: Home / Self Care | Payer: Medicaid Other | Attending: Cardiovascular Disease | Admitting: Cardiovascular Disease

## 2023-07-24 ENCOUNTER — Encounter: Payer: Self-pay | Admitting: Cardiovascular Disease

## 2023-07-24 ENCOUNTER — Encounter
Admission: RE | Disposition: A | Discharge status: Home / Self Care | Payer: Self-pay | Source: Home / Self Care | Attending: Cardiovascular Disease

## 2023-07-24 ENCOUNTER — Other Ambulatory Visit: Payer: Self-pay

## 2023-07-24 DIAGNOSIS — I10 Essential (primary) hypertension: Secondary | ICD-10-CM | POA: Insufficient documentation

## 2023-07-24 DIAGNOSIS — K219 Gastro-esophageal reflux disease without esophagitis: Secondary | ICD-10-CM | POA: Insufficient documentation

## 2023-07-24 DIAGNOSIS — Z7902 Long term (current) use of antithrombotics/antiplatelets: Secondary | ICD-10-CM | POA: Diagnosis not present

## 2023-07-24 DIAGNOSIS — E1151 Type 2 diabetes mellitus with diabetic peripheral angiopathy without gangrene: Secondary | ICD-10-CM | POA: Insufficient documentation

## 2023-07-24 DIAGNOSIS — E785 Hyperlipidemia, unspecified: Secondary | ICD-10-CM | POA: Insufficient documentation

## 2023-07-24 DIAGNOSIS — I714 Abdominal aortic aneurysm, without rupture, unspecified: Secondary | ICD-10-CM | POA: Diagnosis not present

## 2023-07-24 DIAGNOSIS — I25110 Atherosclerotic heart disease of native coronary artery with unstable angina pectoris: Secondary | ICD-10-CM | POA: Insufficient documentation

## 2023-07-24 DIAGNOSIS — Z955 Presence of coronary angioplasty implant and graft: Secondary | ICD-10-CM | POA: Diagnosis not present

## 2023-07-24 DIAGNOSIS — Z79899 Other long term (current) drug therapy: Secondary | ICD-10-CM | POA: Diagnosis not present

## 2023-07-24 DIAGNOSIS — G629 Polyneuropathy, unspecified: Secondary | ICD-10-CM | POA: Diagnosis not present

## 2023-07-24 DIAGNOSIS — Z87891 Personal history of nicotine dependence: Secondary | ICD-10-CM | POA: Insufficient documentation

## 2023-07-24 DIAGNOSIS — I200 Unstable angina: Secondary | ICD-10-CM

## 2023-07-24 HISTORY — PX: LEFT HEART CATH AND CORONARY ANGIOGRAPHY: CATH118249

## 2023-07-24 LAB — GLUCOSE, CAPILLARY: Glucose-Capillary: 105 mg/dL — ABNORMAL HIGH (ref 70–99)

## 2023-07-24 SURGERY — LEFT HEART CATH AND CORONARY ANGIOGRAPHY
Anesthesia: Moderate Sedation | Laterality: Left

## 2023-07-24 SURGERY — LEFT HEART CATH AND CORONARY ANGIOGRAPHY
Anesthesia: Moderate Sedation

## 2023-07-24 MED ORDER — LIDOCAINE HCL 1 % IJ SOLN
INTRAMUSCULAR | Status: AC
  Filled 2023-07-24: qty 20

## 2023-07-24 MED ORDER — HEPARIN SODIUM (PORCINE) 1000 UNIT/ML IJ SOLN
INTRAMUSCULAR | Status: AC
  Filled 2023-07-24: qty 10

## 2023-07-24 MED ORDER — ASPIRIN 81 MG PO CHEW
CHEWABLE_TABLET | ORAL | Status: AC
  Filled 2023-07-24: qty 1

## 2023-07-24 MED ORDER — SODIUM CHLORIDE 0.9 % WEIGHT BASED INFUSION: 1.0000 mL/kg/h | INTRAVENOUS | Status: DC

## 2023-07-24 MED ORDER — VERAPAMIL HCL 2.5 MG/ML IV SOLN
INTRAVENOUS | Status: AC
  Filled 2023-07-24: qty 2

## 2023-07-24 MED ORDER — HEPARIN SODIUM (PORCINE) 1000 UNIT/ML IJ SOLN
INTRAMUSCULAR | Status: DC | PRN
  Administered 2023-07-24: 3500 [IU] via INTRAVENOUS

## 2023-07-24 MED ORDER — LIDOCAINE HCL (PF) 1 % IJ SOLN
INTRAMUSCULAR | Status: DC | PRN
  Administered 2023-07-24: 2 mL

## 2023-07-24 MED ORDER — FENTANYL CITRATE (PF) 100 MCG/2ML IJ SOLN
INTRAMUSCULAR | Status: AC
  Filled 2023-07-24: qty 2

## 2023-07-24 MED ORDER — FENTANYL CITRATE (PF) 100 MCG/2ML IJ SOLN
INTRAMUSCULAR | Status: DC | PRN
  Administered 2023-07-24: 50 ug via INTRAVENOUS
  Administered 2023-07-24: 25 ug via INTRAVENOUS

## 2023-07-24 MED ORDER — VERAPAMIL HCL 2.5 MG/ML IV SOLN
INTRAVENOUS | Status: DC | PRN
  Administered 2023-07-24: 2.5 mg via INTRA_ARTERIAL

## 2023-07-24 MED ORDER — SODIUM CHLORIDE 0.9 % WEIGHT BASED INFUSION
3.0000 mL/kg/h | INTRAVENOUS | Status: AC
  Administered 2023-07-24: 3 mL/kg/h via INTRAVENOUS

## 2023-07-24 MED ORDER — ASPIRIN 81 MG PO CHEW
81.0000 mg | CHEWABLE_TABLET | ORAL | Status: AC
  Administered 2023-07-24: 81 mg via ORAL

## 2023-07-24 MED ORDER — MIDAZOLAM HCL 2 MG/2ML IJ SOLN
INTRAMUSCULAR | Status: AC
  Filled 2023-07-24: qty 2

## 2023-07-24 MED ORDER — IOHEXOL 300 MG/ML  SOLN
INTRAMUSCULAR | Status: DC | PRN
  Administered 2023-07-24: 48 mL

## 2023-07-24 MED ORDER — MIDAZOLAM HCL 2 MG/2ML IJ SOLN
INTRAMUSCULAR | Status: DC | PRN
  Administered 2023-07-24 (×2): 1 mg via INTRAVENOUS

## 2023-07-24 MED ORDER — HEPARIN (PORCINE) IN NACL 1000-0.9 UT/500ML-% IV SOLN
INTRAVENOUS | Status: AC
  Filled 2023-07-24: qty 1000

## 2023-07-24 MED ORDER — HEPARIN (PORCINE) IN NACL 2000-0.9 UNIT/L-% IV SOLN
INTRAVENOUS | Status: DC | PRN
  Administered 2023-07-24: 1000 mL

## 2023-07-24 SURGICAL SUPPLY — 10 items
CATH INFINITI AMBI 5FR JK (CATHETERS) IMPLANT
DEVICE RAD TR BAND REGULAR (VASCULAR PRODUCTS) IMPLANT
DRAPE BRACHIAL (DRAPES) IMPLANT
GLIDESHEATH SLEND SS 6F .021 (SHEATH) IMPLANT
GUIDEWIRE INQWIRE 1.5J.035X260 (WIRE) IMPLANT
INQWIRE 1.5J .035X260CM (WIRE) ×1
PACK CARDIAC CATH (CUSTOM PROCEDURE TRAY) ×1 IMPLANT
PROTECTION STATION PRESSURIZED (MISCELLANEOUS) ×1
SET ATX-X65L (MISCELLANEOUS) IMPLANT
STATION PROTECTION PRESSURIZED (MISCELLANEOUS) IMPLANT

## 2023-07-24 NOTE — Discharge Instructions (Signed)
Radial Site Care Refer to this sheet in the next few weeks. These instructions provide you with information about caring for yourself after your procedure. Your health care provider may also give you more specific instructions. Your treatment has been planned according to current medical practices, but problems sometimes occur. Call your health care provider if you have any problems or questions after your procedure. What can I expect after the procedure? After your procedure, it is typical to have the following: Bruising at the radial site that usually fades within 1-2 weeks. Blood collecting in the tissue (hematoma) that may be painful to the touch. It should usually decrease in size and tenderness within 1-2 weeks.  Follow these instructions at home: Take medicines only as directed by your health care provider. If you are on a medication called Metformin please do not take for 48 hours after your procedure. Over the next 48hrs please increase your fluid intake of water and non caffeine beverages to flush the contrast dye out of your system.  You may shower 24 hours after the procedure  Leave your bandage on and gently wash the site with plain soap and water. Pat the area dry with a clean towel. Do not rub the site, because this may cause bleeding.  Remove your dressing 48hrs after your procedure and leave open to air.  Do not submerge your site in water for 7 days. This includes swimming and washing dishes.  Check your insertion site every day for redness, swelling, or drainage. Do not apply powder or lotion to the site. Do not flex or bend the affected arm for 24 hours or as directed by your health care provider. Do not push or pull heavy objects with the affected arm for 24 hours or as directed by your health care provider. Do not lift over 10 lb (4.5 kg) for 5 days after your procedure or as directed by your health care provider. Ask your health care provider when it is okay to: Return to  work or school. Resume usual physical activities or sports. Resume sexual activity. Do not drive home if you are discharged the same day as the procedure. Have someone else drive you. You may drive 48 hours after the procedure Do not operate machinery or power tools for 24 hours after the procedure. If your procedure was done as an outpatient procedure, which means that you went home the same day as your procedure, a responsible adult should be with you for the first 24 hours after you arrive home. Keep all follow-up visits as directed by your health care provider. This is important. Contact a health care provider if: You have a fever. You have chills. You have increased bleeding from the radial site. Hold pressure on the site. Get help right away if: You have unusual pain at the radial site. You have redness, warmth, or swelling at the radial site. You have drainage (other than a small amount of blood on the dressing) from the radial site. The radial site is bleeding, and the bleeding does not stop after 15 minutes of holding steady pressure on the site. Your arm or hand becomes pale, cool, tingly, or numb. This information is not intended to replace advice given to you by your health care provider. Make sure you discuss any questions you have with your health care provider. Document Released: 11/08/2010 Document Revised: 03/13/2016 Document Reviewed: 04/24/2014 Elsevier Interactive Patient Education  2018 [employer]  

## 2023-07-24 NOTE — Interval H&P Note (Signed)
History and Physical Interval Note:  07/24/2023 10:22 AM  Heather Mahoney  has presented today for surgery, with the diagnosis of L Cath   Angina  CAD.  The various methods of treatment have been discussed with the patient and family. After consideration of risks, benefits and other options for treatment, the patient has consented to  Procedure(s): LEFT HEART CATH AND CORONARY ANGIOGRAPHY (Left) as a surgical intervention.  The patient's history has been reviewed, patient examined, no change in status, stable for surgery.  I have reviewed the patient's chart and labs.  Questions were answered to the patient's satisfaction.     [name]

## 2023-07-27 ENCOUNTER — Encounter: Payer: Self-pay | Admitting: Cardiovascular Disease

## 2023-07-27 ENCOUNTER — Telehealth: Payer: Self-pay | Admitting: Emergency Medicine

## 2023-07-27 MED ORDER — REPATHA SURECLICK 140 MG/ML ~~LOC~~ SOAJ: 140.0000 mg | SUBCUTANEOUS | 3 refills | Status: DC

## 2023-07-27 NOTE — Telephone Encounter (Signed)
Called and spoke with patient. Informed patient of the following from Dr. Mariah Milling.   Lipid panel  Cholesterol is very elevated over 300  We need to start Repatha 140 mg subcu every 2 weeks  Make sure on Crestor 40 daily with Zetia 10 daily   Patient verbalizes understanding. Prescription sent to preferred pharmacy.

## 2023-08-03 ENCOUNTER — Encounter (INDEPENDENT_AMBULATORY_CARE_PROVIDER_SITE_OTHER): Payer: Medicaid Other

## 2023-08-03 ENCOUNTER — Other Ambulatory Visit (INDEPENDENT_AMBULATORY_CARE_PROVIDER_SITE_OTHER): Payer: Medicaid Other

## 2023-08-03 ENCOUNTER — Ambulatory Visit (INDEPENDENT_AMBULATORY_CARE_PROVIDER_SITE_OTHER): Payer: Medicaid Other | Admitting: Nurse Practitioner

## 2023-08-21 NOTE — Progress Notes (Unsigned)
Cardiology Office Note    Date:  08/26/2023   ID:  Heather Mahoney, Heather Mahoney 05/09/1966, MRN [id number]  PCP:  Hilbert Bible, FNP  Cardiologist:  Julien Nordmann, MD  Electrophysiologist:  None   Chief Complaint: Follow-up  History of Present Illness:   Heather Mahoney is a 57 y.o. female with history of CAD status post prior PCI to the RCA x 2 in 12/2018, AAA status post endovascular repair and a right femoral endarterectomy due to ischemic right lower extremity in 07/2018 followed by vascular surgery, orthostatic syncope, HTN, HLD, tobacco use, PVCs, and GERD who presents for follow-up of LHC.  She was previously evaluated by Dr. Alvino Chapel for possible syncope in 2017.  Echo at that time showed an EF of 55 to 60%, no regional wall motion abnormalities, normal LV diastolic function, mild mitral regurgitation, normal RV systolic function, normal RVSP, and trivial pericardial effusion.  Treadmill MPI at that time showed no significant ischemia with an EF of 46% felt to be decreased secondary to GI uptake artifact, rare PVCs, good exercise tolerance with the patient achieving 10.1 METS, and was overall low risk.  Outpatient cardiac monitoring showed a predominant rhythm of sinus with an average rate of 88 bpm (range 57 to 136 bpm), 10% of the beats were tachycardic, for isolated PACs, and 14,946 isolated PVCs with an overall 12% burden.  She was seen in 11/2018 with exertional dyspnea with subsequent coronary CTA showing hemodynamically significant stenosis in the mid RCA with indeterminate FFR in the proximal LAD and proximal LCx.  In this setting she underwent LHC in 12/2018 that showed ostial LAD 30% stenosis, proximal to mid LCx 40% stenosis, OM1 60% stenosis, proximal RCA 75% stenosis, mid RCA 80% stenosis with normal LVEDP and LVEF of 65%.  She underwent successful PCI/DES x 2 to the RCA.  Echo in 02/2019 showed an EF of 50 to 55%, unable to exclude hypokinesis of the inferior wall, diastolic  dysfunction, and mild mitral regurgitation.  She was seen in the ED in 03/2023 with chest pain with negative high-sensitivity troponin x 2.  CTA chest negative for PE with a small pericardial effusion and aortic atherosclerosis noted.  She was most recently seen in the office on 07/20/2023 noting exertional chest discomfort and shortness of breath.  In this setting she underwent LHC 07/24/2023 that showed widely patent RCA stents with mild in-stent restenosis, stable mild to moderate LAD and LCx disease, and new 80% stenosis in the right posterior AV groove, however the vessel was small in diameter, at most 2 mm, and was too small to stent.  LVEF 55 to 65% by visual estimate.  LVEDP mildly elevated.  With regards to her AAA and PAD, she is followed by vascular surgery with ABI in 06/2023 normal bilaterally, abdominal ultrasound showing patent endovascular aneurysm repair without evidence of endoleak, and right LEA stent patent without evidence of significant stenosis.  She comes in doing well from a cardiac perspective and is without symptoms of angina or cardiac decompensation at this time.  She does indicate she continues to have some exertional fatigue and shortness of breath.  No frank chest pain.  She indicates for a while she was eating out at [facility] and [employer] quite regularly, in the context of the most recent lipid panel obtained in September.  She also reports a strong family history of hyperlipidemia.  In the setting of her significant hyperlipidemia she was afraid to eat, though this is improved.  She  has been adherent to rosuvastatin 40 mg and ezetimibe.  She has recently started modifying her diet and is now following a heart healthy diet.  No right radial arteriotomy site complications.  No dizziness, presyncope, or syncope.  No falls or symptoms concerning for bleeding.   Labs independently reviewed: 06/2023 - BUN 13, serum creatinine 1.09, potassium 4.6, Hgb 11.5, PLT 43, TC 321, TG 189,  HDL 46, LDL 238  02/2022 - albumin 4.7, AST/ALT normal 10/2021 - TSH 5.540, free T4 normal  Past Medical History:  Diagnosis Date   Anxiety    Arthritis    CAD (coronary artery disease)    a. PCI to proximal and distal RCA 3/26   Cancer [facility])    Mom grandma   Chronic kidney disease    Mom   Depression 2023   Diabetes mellitus without complication (HCC)    GERD (gastroesophageal reflux disease)    Heart murmur    MR   Hiatal hernia    Hot flashes    Hyperlipidemia    Hypertension    Myocardial infarction (HCC)    Neuropathy    Thyroid disease    Mom   Tobacco abuse    a. Started at age 79, quit during 3 pregnancies. b. 1 PPD for a 40 pack-year hx (2019)     Past Surgical History:  Procedure Laterality Date   ABDOMINAL AORTIC ANEURYSM REPAIR     2019   BREAST BIOPSY Right    Multiple biopsies-all benign   BREAST SURGERY  1986   I&D for 'milk duct'   COLONOSCOPY WITH PROPOFOL N/A 07/07/2022   Procedure: COLONOSCOPY WITH PROPOFOL;  Surgeon: Wyline Mood, MD;  Location: [facility] ENDOSCOPY;  Service: Gastroenterology;  Laterality: N/A;   CORONARY STENT INTERVENTION N/A 01/13/2019   Procedure: CORONARY STENT INTERVENTION;  Surgeon: Yvonne Kendall, MD;  Location: [facility] INVASIVE CV LAB;  Service: Cardiovascular;  Laterality: N/A;   EMBOLECTOMY  08/13/2018   Procedure: Right SFA and profunda femoris Fogarty embolectomy 3  Fogarty embolectomy balloon;  Surgeon: Annice Needy, MD;  Location: [facility] ORS;  Service: Vascular;;   ENDARTERECTOMY FEMORAL Right 08/13/2018   Procedure: Right common femoral, profunda femoris, and superficial femoral artery endarterectomies and patch angioplasty ;  Surgeon: Annice Needy, MD;  Location: [facility] ORS;  Service: Vascular;  Laterality: Right;   ENDOVASCULAR REPAIR/STENT GRAFT N/A 08/13/2018   Procedure: ENDOVASCULAR REPAIR/STENT GRAFT;  Surgeon: Renford Dills, MD;  Location: [facility] INVASIVE CV LAB;  Service: Cardiovascular;  Laterality: N/A;   LEFT  HEART CATH AND CORONARY ANGIOGRAPHY Left 01/13/2019   Procedure: LEFT HEART CATH AND CORONARY ANGIOGRAPHY;  Surgeon: Antonieta Iba, MD;  Location: [facility] INVASIVE CV LAB;  Service: Cardiovascular;  Laterality: Left;   LEFT HEART CATH AND CORONARY ANGIOGRAPHY Left 07/24/2023   Procedure: LEFT HEART CATH AND CORONARY ANGIOGRAPHY;  Surgeon: Iran Ouch, MD;  Location: [facility] INVASIVE CV LAB;  Service: Cardiovascular;  Laterality: Left;   LOWER EXTREMITY ANGIOGRAPHY Right 09/08/2022   Procedure: Lower Extremity Angiography;  Surgeon: Annice Needy, MD;  Location: [facility] INVASIVE CV LAB;  Service: Cardiovascular;  Laterality: Right;   LOWER EXTREMITY ANGIOGRAPHY Right 09/15/2022   Procedure: Lower Extremity Angiography;  Surgeon: Annice Needy, MD;  Location: [facility] INVASIVE CV LAB;  Service: Cardiovascular;  Laterality: Right;   SALPINGOOPHORECTOMY Left 2000    Current Medications: No outpatient medications have been marked as taking for the 08/26/23 encounter (Office Visit) with Sondra Barges, PA-C.  Allergies:   Strawberry extract and Strawberry flavor   Social History   Socioeconomic History   Marital status: Single    Spouse name: Not on file   Number of children: 3   Years of education: Not on file   Highest education level: GED or equivalent  Occupational History   Not on file  Tobacco Use   Smoking status: Former    Current packs/day: 0.00    Average packs/day: 0.3 packs/day for 38.0 years (9.5 ttl pk-yrs)    Types: Cigarettes    Start date: 08/10/1980    Quit date: 08/10/2018    Years since quitting: 5.0   Smokeless tobacco: Never   Tobacco comments:    Quit 08-10-2018  Vaping Use   Vaping status: Never Used  Substance and Sexual Activity   Alcohol use: Not Currently    Comment: occassional,none last 24hrs   Drug use: Not Currently    Types: Marijuana    Comment: Smoke caused severe chest pain stopped immediately   Sexual activity: Yes    Birth control/protection:  Post-menopausal  Other Topics Concern   Not on file  Social History Narrative   Lives with youngest daughter Heather Mahoney. 2 Sons lives in [place]. 2 indoor dogs.   Social Determinants of Health   Financial Resource Strain: Low Risk  (08/23/2023)   Overall Financial Resource Strain (CARDIA)    Difficulty of Paying Living Expenses: Not hard at all  Food Insecurity: No Food Insecurity (08/23/2023)   Hunger Vital Sign    Worried About Running Out of Food in the Last Year: Never true    Ran Out of Food in the Last Year: Never true  Transportation Needs: No Transportation Needs (08/23/2023)   PRAPARE - Administrator, Civil Service (Medical): No    Lack of Transportation (Non-Medical): No  Physical Activity: Insufficiently Active (08/23/2023)   Exercise Vital Sign    Days of Exercise per Week: 2 days    Minutes of Exercise per Session: 10 min  Stress: Stress Concern Present (08/23/2023)   [employer] of Occupational Health - Occupational Stress Questionnaire    Feeling of Stress : Very much  Social Connections: Socially Isolated (08/23/2023)   Social Connection and Isolation Panel [NHANES]    Frequency of Communication with Friends and Family: More than three times a week    Frequency of Social Gatherings with Friends and Family: Never    Attends Religious Services: Never    Database administrator or Organizations: No    Attends Engineer, structural: Not on file    Marital Status: Separated     Family History:  The patient's family history includes Asthma (age of onset: 91) in her mother; Breast cancer in her mother; Breast cancer (age of onset: 17) in her maternal grandmother; Diabetes in her maternal grandmother and mother; Heart attack in her mother; Heart disease in her maternal grandmother and mother; Hyperlipidemia in her maternal grandmother and mother; Hypertension in her mother; Sarcoidosis in her mother.  ROS:   12-point review of systems is negative unless  otherwise noted in the HPI.   EKGs/Labs/Other Studies Reviewed:    Studies reviewed were summarized above. The additional studies were reviewed today:  LHC 07/24/2023:   Ost 1st Mrg to 1st Mrg lesion is 60% stenosed.   Ost LAD to Prox LAD lesion is 40% stenosed.   Prox Cx to Mid Cx lesion is 50% stenosed.   Prox RCA to Mid RCA lesion is  20% stenosed.   Dist RCA lesion is 10% stenosed.   Mid RCA lesion is 30% stenosed.   RPAV lesion is 80% stenosed.   The left ventricular systolic function is normal.   LV end diastolic pressure is mildly elevated.   The left ventricular ejection fraction is 55-65% by visual estimate.   1.  Widely patent RCA stents with mild in-stent restenosis.  Stable mild to moderate LAD and left circumflex disease.  There is new 80% stenosis in the right posterior AV groove artery but vessel diameter is at most 2 mm in that area. 2.  Normal LV systolic function and mildly elevated left ventricular end-diastolic pressure.   Recommendations: Recommend continued aggressive medical therapy. The right posterior AV groove artery is too small to stent. ___________  Renal artery ultrasound 08/10/2019: Summary:  Largest Aortic Diameter: 2.1 cm    Renal:    Right: Normal size right kidney. No evidence of right renal artery         stenosis. Normal right Resisitive Index. Normal cortical         thickness of right kidney. RRV flow present.  Left:  Normal size of left kidney. No evidence of left renal artery         stenosis. Normal left Resistive Index. Abnormal cortical         thickness of the left kidney. LRV flow present.  Mesenteric:  Normal Celiac artery and Superior Mesenteric artery findings.  __________  2D echo 02/23/2019: 1. The left ventricle has low normal systolic function, with an ejection  fraction of 50-55%. The cavity size was normal. There is mildly increased  left ventricular wall thickness. Unable to exclude mild hypokinesis of the  inferior  wall. Left ventricular   diastolic Doppler parameters are consistent with impaired relaxation.   2. The right ventricle has normal systolic function. The cavity was  normal. There is no increase in right ventricular wall thickness. Unable  to estimate RVSP.  __________  [facility] 01/13/2019: Coronary dominance: Right dominant  Left mainstem: Large vessel that bifurcates into the LAD and left circumflex, no significant disease noted  Left anterior descending (LAD): Large vessel that extends to the apical region, diagonal branch 2 of moderate size, mild proximal LAD disease estimated at 30%  Left circumflex (LCx): Large vessel with OM branch 2, mild mid left circumflex disease estimated at 40%, high OM branches small bifurcating diffusely diseased 60% proximal disease  Right coronary artery (RCA): Right dominant vessel with PL and PDA, moderate to large sized vessel 75% proximal napkin ring lesion, with 80% distal stenosis circumferential  Left ventriculography: Left ventricular systolic function is normal, LVEF is estimated at 55-65%, there is no significant mitral regurgitation , no significant aortic valve stenosis  Final Conclusions:  Single-vessel disease notably of the RCA moderate to severe proximal disease estimated 75% and distal RCA disease estimated at 80% There is disease of high OM vessel, small in caliber diffusely diseased Mild proximal LAD disease  Recommendations:  Case discussed with interventional cardiology, Dr. END Given functional study/cardiac CTA showing stenosis in RCA by FFR and given her symptoms concerning for unstable angina, will consider stenting of proximal RCA and distal RCA Medical management for disease on the left system   PCI 01/13/2019: Conclusions: Severe single vessel coronary artery disease with diffuse disease involving the RCA with focal lesions of up to 80% involving the proximal and distal vessel (see diagnostic angiography report by Dr. Mariah Milling for  details). Successful PCI to the  proximal RCA using a Resolute Onyx 2.75 x 22 mm drug-eluting stent with 0% residual stenosis and TIMI-3 flow. Successful PCI to the distal RCA using a Resolute Onyx 2.75 x 18 mm drug-eluting stent with 0% residual stenosis and TIMI-3 flow.   Recommendations: Overnight extended recovery. Dual antiplatelet therapy with aspirin and clopidogrel for at least 12 months, ideally longer given multivessel coronary artery and peripheral vascular disease. Aggressive secondary prevention. __________  Coronary CTA 01/04/2019: FINDINGS: Coronary calcium score: The patient's coronary artery calcium score is 51, which places the patient in the 94 percentile.   Coronary arteries: Normal coronary origins.  Right dominance.   Right Coronary Artery: Proximal RCA atherosclerotic plaque with moderate stenosis (50-69%). Possible severe stenosis in the mid RCA, 70-99 % stenosis, atherosclerotic plaque. This area is poorly visualized due to motion and image resolution.   Left Main Coronary Artery: No detectable plaque or stenosis.   Left Anterior Descending Coronary Artery: Tubular plaque progresses from mild to severe stenosis in proximal LAD. At narrowest point, severe mixed atherosclerotic plaque with positive remodeling, 70-99% stenosis. Mild diffuse plaque in mid and distal LAD.   Left Circumflex Artery: Severe stenosis in the proximal L circumflex with atherosclerotic plaque, 70-99% stenosis.   Aorta: Normal size, 34 mm at the mid ascending aorta (level of the PA bifurcation) measured double oblique. No calcifications. No dissection.   Aortic Valve: No calcifications.   Other findings:   Normal pulmonary vein drainage into the left atrium.   Normal left atrial appendage without a thrombus.   Normal size of the pulmonary artery.   IMPRESSION: 1. Severe CAD, CADRADS = 4V. Severe atherosclerotic plaque with 70-99% stenosis in the proximal LAD, proximal L  circumflex and mid RCA. Positive remodeling with spotty calcifications in the proximal LAD, suggesting vulnerable plaque characteristics. 2. The patient's coronary artery calcium score is 51, which places the patient in the 94 percentile. 3. Normal coronary origin with right dominance.  ctFFR: 1. Left Main:  No significant stenosis. FFR = 0.98 2. LAD: Proximal FFR = 0.77, Mid FFR = 0.75, Distal FFR = 0.73 3. LCX: Proximal FFR = 0.79, Distal FFR = 0.68 4. RCA: Proximal FFR = 0.95, Mid FFR = 0.67, Distal FFR = 0.52   IMPRESSION: 1. CT FFR analysis showed hemodynamically significant stenosis in the mid RCA. Indeterminate FFR in the proximal LAD and proximal L circumflex lesions. Consider coronary angiography. ___________  2D echo 02/06/2016: - Left ventricle: The cavity size was normal. Systolic function was    normal. The estimated ejection fraction was in the range of 55%    to 60%. Wall motion was normal; there were no regional wall    motion abnormalities. Left ventricular diastolic function    parameters were normal.  - Mitral valve: There was mild regurgitation.  - Right ventricle: Systolic function was normal.  - Pulmonary arteries: Systolic pressure was within the normal    range.  - Pericardium, extracardiac: A trivial pericardial effusion was    identified.  __________  24-hour Holter 01/2016: Overall rhythm was sinus rhythm, with average heart rate of 88 BPM. Minimal heart rate of 57 bpm at 5:24 AM 02/06/2016. Maximum heart rate of 136 BPM and 1747 02/05/2016. 10% total number of beats in tachycardia. Longest RR interval was 1.46 seconds.   No high grade supraventricular ectopy: 4 isolated PACs.    Ventricular ectopy: 14,946 isolated PVCs. 53 ventricular couplets. No ventricular runs. 12% of total number of beats were ventricular beats.  No documented atrial fibrillation. __________  Treadmill MPI 01/23/2016: Blood pressure demonstrated a hypertensive response to  exercise. There was no ST segment deviation noted during stress. No T wave inversion was noted during stress.   Exercise myocardial perfusion imaging study with no significant  ischemia Normal wall motion, EF estimated at 46% Depressed EF likely secondary to GI uptake artifact  No EKG changes concerning for ischemia at peak stress or in recovery. Rare PVCs noted Good exercise tolerance, 10.1 METS achieved Low risk scan   EKG:  EKG is ordered today.  The EKG ordered today demonstrates NSR, 80 bpm, first-degree AV block, nonspecific ST-T changes, consistent with prior tracings  Recent Labs: 07/20/2023: BUN 13; Creatinine, Ser 1.09; Hemoglobin 11.5; Platelets 483; Potassium 4.6; Sodium 141  Recent Lipid Panel    Component Value Date/Time   CHOL 321 (H) 07/20/2023 1525   TRIG 189 (H) 07/20/2023 1525   HDL 46 07/20/2023 1525   CHOLHDL 7.0 (H) 07/20/2023 1525   CHOLHDL 4.5 07/09/2020 1803   VLDL 34 07/09/2020 1803   LDLCALC 238 (H) 07/20/2023 1525   LDLDIRECT 185 (H) 12/06/2018 1017    PHYSICAL EXAM:    VS:  BP 136/82 (BP Location: Left Arm, Patient Position: Sitting, Cuff Size: Normal)   Pulse 80   Ht 5\' 6"  (1.676 m)   Wt 159 lb 3.2 oz (72.2 kg)   SpO2 97%   BMI 25.70 kg/m   BMI: Body mass index is 25.7 kg/m.  Physical Exam Vitals reviewed.  Constitutional:      Appearance: She is well-developed.  HENT:     Head: Normocephalic and atraumatic.  Eyes:     General:        Right eye: No discharge.        Left eye: No discharge.  Neck:     Vascular: No JVD.  Cardiovascular:     Rate and Rhythm: Normal rate and regular rhythm.     Pulses:          Posterior tibial pulses are 2+ on the right side and 2+ on the left side.     Heart sounds: Normal heart sounds, S1 normal and S2 normal. Heart sounds not distant. No midsystolic click and no opening snap. No murmur heard.    No friction rub.  Pulmonary:     Effort: Pulmonary effort is normal. No respiratory distress.      Breath sounds: Normal breath sounds. No decreased breath sounds, wheezing, rhonchi or rales.  Chest:     Chest wall: No tenderness.  Abdominal:     General: There is no distension.  Musculoskeletal:     Cervical back: Normal range of motion.     Right lower leg: No edema.     Left lower leg: No edema.  Skin:    General: Skin is warm and dry.     Nails: There is no clubbing.  Neurological:     Mental Status: She is alert and oriented to person, place, and time.  Psychiatric:        Speech: Speech normal.        Behavior: Behavior normal.        Thought Content: Thought content normal.        Judgment: Judgment normal.     Wt Readings from Last 3 Encounters:  08/26/23 159 lb 3.2 oz (72.2 kg)  08/24/23 159 lb 3.2 oz (72.2 kg)  07/24/23 167 lb (75.8 kg)     ASSESSMENT & PLAN:  CAD involving the native coronaries with stable angina: No current symptoms of angina or cardiac decompensation.  Recent LHC showed patent RCA stents with mild in-stent restenosis and otherwise nonobstructive disease involving the left coronary tree with medical therapy recommended.  Continue aggressive risk factor modification and secondary prevention including aspirin 81 mg, clopidogrel 75 mg, amlodipine 10 mg, ezetimibe 10 mg, rosuvastatin 40 mg, and metoprolol succinate 50 mg.  No right radial arteriotomy site complications.  She does note exertional fatigue and dyspnea.  Obtain echo.  HTN: Blood pressure is reasonably controlled in the office today.  Continue amlodipine to 10 mg, losartan 100 mg, and metoprolol succinate 50 mg.  Low-sodium diet encouraged.  HLD: Total cholesterol 321 with LDL 238 in 06/2023 with normal AST/ALT in 02/2022.  Target LDL less than 55 in the context of underlying CAD and PAD.  Concern for familial hyperlipidemia.  She was eating a significant amount of fast food leading up to the most recent lipid panel and has now improved her diet.  She is concerned that Medicaid will not pay for  PCSK9 inhibitor.  We will refer her to the lipid clinic for further evaluation given severe hyperlipidemia in the context of underlying CAD and PAD.  Continue rosuvastatin 40 mg and ezetimibe 10 mg.  AAA: Status post endovascular repair with ongoing management per vascular surgery.  PAD: Denies any symptoms of lifestyle limiting claudication.  No nonhealing wounds.  Followed by vascular surgery.  Remains on aspirin and statin therapy.   Disposition: F/u with Dr. Mariah Milling or an APP in 4 months.   Medication Adjustments/Labs and Tests Ordered: Current medicines are reviewed at length with the patient today.  Concerns regarding medicines are outlined above. Medication changes, Labs and Tests ordered today are summarized above and listed in the Patient Instructions accessible in Encounters.   Signed, Eula Listen, PA-C 08/26/2023 4:57 PM     [facility] [place] [address] [place], [place] [address] [phone]

## 2023-08-24 ENCOUNTER — Ambulatory Visit (HOSPITAL_BASED_OUTPATIENT_CLINIC_OR_DEPARTMENT_OTHER): Payer: Medicaid Other | Admitting: Family Medicine

## 2023-08-24 ENCOUNTER — Encounter (HOSPITAL_BASED_OUTPATIENT_CLINIC_OR_DEPARTMENT_OTHER): Payer: Self-pay | Admitting: Family Medicine

## 2023-08-24 VITALS — BP 128/87 | HR 63 | Ht 66.0 in | Wt 159.2 lb

## 2023-08-24 DIAGNOSIS — E119 Type 2 diabetes mellitus without complications: Secondary | ICD-10-CM

## 2023-08-24 DIAGNOSIS — Z7689 Persons encountering health services in other specified circumstances: Secondary | ICD-10-CM

## 2023-08-24 DIAGNOSIS — F32A Depression, unspecified: Secondary | ICD-10-CM | POA: Diagnosis not present

## 2023-08-24 DIAGNOSIS — R7989 Other specified abnormal findings of blood chemistry: Secondary | ICD-10-CM | POA: Diagnosis not present

## 2023-08-24 DIAGNOSIS — F419 Anxiety disorder, unspecified: Secondary | ICD-10-CM | POA: Diagnosis not present

## 2023-08-24 MED ORDER — BUPROPION HCL ER (XL) 150 MG PO TB24: 150.0000 mg | ORAL_TABLET | Freq: Every day | ORAL | 3 refills | Status: DC

## 2023-08-24 NOTE — Progress Notes (Signed)
New Patient Office Visit  Subjective:   Heather Mahoney Jun 04, 1966 08/24/2023  Chief Complaint  Patient presents with   New Patient (Initial Visit)    Patient is here today to get established with the practice. Patient states she has been having stomach problems to the point that she has not been able to eat.    HPI: Heather [place]oney presents today to establish [facility] at [facility]. Introduced to Publishing rights manager role and practice setting.  All questions answered.   Last PCP: [facility]  Last annual physical: Unknown  Concerns: See below   ANXIETY and DEPRESSION: Heather Mahoney presents for the medical management of anxiety. Patient has noticed significant decrease in her appetite, irritation any time there is emotional stress. She has ongoing stressors at home with granddaughter and significant situational anxiety in new situations. She states she was on a missions trip last week in [place] and could not eat due to anxiety.  Current medication regimen: none Counseling: Recommended  Well controlled: Not currently Denies SI/HI.     08/24/2023    9:43 AM  GAD 7 : Generalized Anxiety Score  Nervous, Anxious, on Edge 1  Control/stop worrying 1  Worry too much - different things 1  Trouble relaxing 1  Restless 1  Easily annoyed or irritable 1  Afraid - awful might happen 3  Total GAD 7 Score 9  Anxiety Difficulty Somewhat difficult       08/24/2023    9:37 AM 01/09/2016    9:26 AM 12/12/2015    2:32 PM 11/29/2015    8:41 AM  Depression screen PHQ 2/9  Decreased Interest 0 0 0 0  Down, Depressed, Hopeless 0 0 0 0  PHQ - 2 Score 0 0 0 0  Altered sleeping 3     Tired, decreased energy 2     Change in appetite 3     Feeling bad or failure about yourself  0     Trouble concentrating 2     Moving slowly or fidgety/restless 0     Suicidal thoughts 0     PHQ-9 Score 10     Difficult doing work/chores  Somewhat difficult       DIABETES MELLITUS: Heather Mahoney presents for the medical management of diabetes.  Current diabetes medication regimen: Farxiga 10mg  (could not tolerate Metformin in the past)  Patient is  adhering to a diabetic diet.  Patient is  exercising regularly.  Patient is not checking BS regularly.  Patient is  checking their feet regularly.  Denies polydipsia, polyphagia, polyuria, open wounds or ulcers on feet.    Last A1C - "Under 7" per patient over 3 months ago. Pt states she had eye exam with [place] [facility] in the past 1-2 months.   No results found for: "HGBA1C"  No foot exam found No results found for: "LABMICR", "MICROALBUR"  Wt Readings from Last 3 Encounters:  08/24/23 159 lb 3.2 oz (72.2 kg)  07/24/23 167 lb (75.8 kg)  07/20/23 167 lb 2 oz (75.8 kg)     The following portions of the patient's history were reviewed and updated as appropriate: past medical history, past surgical history, family history, social history, allergies, medications, and problem list.   Patient Active Problem List   Diagnosis Date Noted   Anxiety and depression 08/24/2023   Encounter for screening colonoscopy    Adenomatous polyp of colon  Acute gastroenteritis 04/26/2020   History of endovascular stent graft for abdominal aortic aneurysm 04/26/2020   Urticaria 04/26/2020   Diabetes (HCC) 07/05/2019   Precordial chest pain    Chest pain 04/09/2019   Atherosclerosis of native arteries of extremity with intermittent claudication (HCC) 04/01/2019   Coronary artery disease of native artery of native heart with stable angina pectoris (HCC) 01/26/2019   CAD (coronary artery disease) 01/14/2019   Unstable angina (HCC) 01/13/2019   PAD (peripheral artery disease) (HCC) 08/30/2018   AAA (abdominal aortic aneurysm) (HCC) 08/10/2018   Chest pain with moderate risk for cardiac etiology 12/18/2017   Scotoma 12/18/2017   PVC (premature ventricular contraction) 02/12/2016    Essential hypertension, benign 02/12/2016   Tobacco use disorder 02/12/2016   Migraine headache 01/09/2016   Lymphocytosis 12/21/2015   Hypertension 12/12/2015   Mixed hyperlipidemia 12/12/2015   Vitamin D deficiency 12/12/2015   Mitral regurgitation 12/12/2015   Hypertensive urgency 11/29/2015   Abdominal pulsatile mass 11/29/2015   Past Medical History:  Diagnosis Date   Anxiety    Arthritis    CAD (coronary artery disease)    a. PCI to proximal and distal RCA 3/26   Cancer [facility])    Mom grandma   Chronic kidney disease    Mom   Depression 2023   Diabetes mellitus without complication (HCC)    GERD (gastroesophageal reflux disease)    Heart murmur    MR   Hiatal hernia    Hot flashes    Hyperlipidemia    Hypertension    Myocardial infarction (HCC)    Neuropathy    Thyroid disease    Mom   Tobacco abuse    a. Started at age 12, quit during 3 pregnancies. b. 1 PPD for a 40 pack-year hx (2019)    Past Surgical History:  Procedure Laterality Date   ABDOMINAL AORTIC ANEURYSM REPAIR     2019   BREAST BIOPSY Right    Multiple biopsies-all benign   BREAST SURGERY  1986   I&D for 'milk duct'   COLONOSCOPY WITH PROPOFOL N/A 07/07/2022   Procedure: COLONOSCOPY WITH PROPOFOL;  Surgeon: Wyline Mood, MD;  Location: [facility] ENDOSCOPY;  Service: Gastroenterology;  Laterality: N/A;   CORONARY STENT INTERVENTION N/A 01/13/2019   Procedure: CORONARY STENT INTERVENTION;  Surgeon: Yvonne Kendall, MD;  Location: [facility] INVASIVE CV LAB;  Service: Cardiovascular;  Laterality: N/A;   EMBOLECTOMY  08/13/2018   Procedure: Right SFA and profunda femoris Fogarty embolectomy 3  Fogarty embolectomy balloon;  Surgeon: Annice Needy, MD;  Location: [facility] ORS;  Service: Vascular;;   ENDARTERECTOMY FEMORAL Right 08/13/2018   Procedure: Right common femoral, profunda femoris, and superficial femoral artery endarterectomies and patch angioplasty ;  Surgeon: Annice Needy, MD;  Location: [facility] ORS;   Service: Vascular;  Laterality: Right;   ENDOVASCULAR REPAIR/STENT GRAFT N/A 08/13/2018   Procedure: ENDOVASCULAR REPAIR/STENT GRAFT;  Surgeon: Renford Dills, MD;  Location: [facility] INVASIVE CV LAB;  Service: Cardiovascular;  Laterality: N/A;   LEFT HEART CATH AND CORONARY ANGIOGRAPHY Left 01/13/2019   Procedure: LEFT HEART CATH AND CORONARY ANGIOGRAPHY;  Surgeon: Antonieta Iba, MD;  Location: [facility] INVASIVE CV LAB;  Service: Cardiovascular;  Laterality: Left;   LEFT HEART CATH AND CORONARY ANGIOGRAPHY Left 07/24/2023   Procedure: LEFT HEART CATH AND CORONARY ANGIOGRAPHY;  Surgeon: Iran Ouch, MD;  Location: [facility] INVASIVE CV LAB;  Service: Cardiovascular;  Laterality: Left;   LOWER EXTREMITY ANGIOGRAPHY Right 09/08/2022   Procedure: Lower Extremity  Angiography;  Surgeon: Annice Needy, MD;  Location: [facility] INVASIVE CV LAB;  Service: Cardiovascular;  Laterality: Right;   LOWER EXTREMITY ANGIOGRAPHY Right 09/15/2022   Procedure: Lower Extremity Angiography;  Surgeon: Annice Needy, MD;  Location: [facility] INVASIVE CV LAB;  Service: Cardiovascular;  Laterality: Right;   SALPINGOOPHORECTOMY Left 2000   Family History  Problem Relation Age of Onset   Breast cancer Mother    Diabetes Mother    Hypertension Mother    Hyperlipidemia Mother    Heart disease Mother        CABG X 4   Asthma Mother 68   Sarcoidosis Mother        In remission   Heart attack Mother    Breast cancer Maternal Grandmother 21   Diabetes Maternal Grandmother    Heart disease Maternal Grandmother    Hyperlipidemia Maternal Grandmother    Social History   Socioeconomic History   Marital status: Single    Spouse name: Not on file   Number of children: 3   Years of education: Not on file   Highest education level: GED or equivalent  Occupational History   Not on file  Tobacco Use   Smoking status: Former    Current packs/day: 0.00    Average packs/day: 0.3 packs/day for 38.0 years (9.5 ttl pk-yrs)    Types:  Cigarettes    Start date: 08/10/1980    Quit date: 08/10/2018    Years since quitting: 5.0   Smokeless tobacco: Never   Tobacco comments:    Quit 08-10-2018  Vaping Use   Vaping status: Never Used  Substance and Sexual Activity   Alcohol use: Not Currently    Comment: occassional,none last 24hrs   Drug use: Not Currently    Types: Marijuana    Comment: Smoke caused severe chest pain stopped immediately   Sexual activity: Yes    Birth control/protection: Post-menopausal  Other Topics Concern   Not on file  Social History Narrative   Lives with youngest daughter Joslyn Devon. 2 Sons lives in [place]. 2 indoor dogs.   Social Determinants of Health   Financial Resource Strain: Low Risk  (08/23/2023)   Overall Financial Resource Strain (CARDIA)    Difficulty of Paying Living Expenses: Not hard at all  Food Insecurity: No Food Insecurity (08/23/2023)   Hunger Vital Sign    Worried About Running Out of Food in the Last Year: Never true    Ran Out of Food in the Last Year: Never true  Transportation Needs: No Transportation Needs (08/23/2023)   PRAPARE - Administrator, Civil Service (Medical): No    Lack of Transportation (Non-Medical): No  Physical Activity: Insufficiently Active (08/23/2023)   Exercise Vital Sign    Days of Exercise per Week: 2 days    Minutes of Exercise per Session: 10 min  Stress: Stress Concern Present (08/23/2023)   [employer] of Occupational Health - Occupational Stress Questionnaire    Feeling of Stress : Very much  Social Connections: Socially Isolated (08/23/2023)   Social Connection and Isolation Panel [NHANES]    Frequency of Communication with Friends and Family: More than three times a week    Frequency of Social Gatherings with Friends and Family: Never    Attends Religious Services: Never    Database administrator or Organizations: No    Attends Banker Meetings: Not on file    Marital Status: Separated  Intimate Partner  Violence: Not At Risk (05/23/2021)  Humiliation, Afraid, Rape, and Kick questionnaire    Fear of Current or Ex-Partner: No    Emotionally Abused: No    Physically Abused: No    Sexually Abused: No   Outpatient Medications Prior to Visit  Medication Sig Dispense Refill   amLODipine (NORVASC) 10 MG tablet Take 1 tablet (10 mg total) by mouth daily. 90 tablet 3   aspirin 81 MG EC tablet Take 1 tablet (81 mg total) by mouth daily. 90 tablet 3   cloNIDine (CATAPRES) 0.2 MG tablet Take 1 tablet (0.2 mg total) by mouth in the morning and at bedtime. Take an extra 0.1 mg for BP >160 180 tablet 3   clopidogrel (PLAVIX) 75 MG tablet Take 1 tablet (75 mg total) by mouth daily. 90 tablet 3   dapagliflozin propanediol (FARXIGA) 10 MG TABS tablet Take 10 mg by mouth daily.     ezetimibe (ZETIA) 10 MG tablet Take 1 tablet (10 mg total) by mouth daily. 90 tablet 4   gabapentin (NEURONTIN) 600 MG tablet Take 600 mg by mouth 3 (three) times daily as needed.     losartan (COZAAR) 100 MG tablet Take 1 tablet (100 mg total) by mouth daily. 30 tablet 2   pantoprazole (PROTONIX) 40 MG tablet Take 1 tablet (40 mg total) by mouth daily. 90 tablet 3   venlafaxine (EFFEXOR) 75 MG tablet Take 75 mg by mouth 2 (two) times daily with a meal.     metoprolol succinate (TOPROL-XL) 50 MG 24 hr tablet Take 1 tablet (50 mg total) by mouth daily. Take with or immediately following a meal. 90 tablet 3   rosuvastatin (CRESTOR) 40 MG tablet Take 1 tablet (40 mg total) by mouth daily. 90 tablet 3   Evolocumab (REPATHA SURECLICK) 140 MG/ML SOAJ Inject 140 mg into the skin every 14 (fourteen) days. 6 mL 3   No facility-administered medications prior to visit.   Allergies  Allergen Reactions   Strawberry Extract Hives and Swelling   Strawberry Flavor Rash    ROS: A complete ROS was performed with pertinent positives/negatives noted in the HPI. The remainder of the ROS are negative.   Objective:   Today's Vitals   08/24/23  0922  BP: 128/87  Pulse: 63  SpO2: 100%  Weight: 159 lb 3.2 oz (72.2 kg)  Height: 5\' 6"  (1.676 m)    GENERAL: Well-appearing, in NAD. Well nourished.  SKIN: Pink, warm and dry. No rash, lesion, ulceration, or ecchymoses.  Head: Normocephalic. NECK: Trachea midline. Full ROM w/o pain or tenderness.  RESPIRATORY: Chest wall symmetrical. Respirations even and non-labored. Breath sounds clear to auscultation bilaterally.  CARDIAC: S1, S2 present, regular rate and rhythm without murmur or gallops. Peripheral pulses 2+ bilaterally.  MSK: Muscle tone and strength appropriate for age.  NEUROLOGIC: No motor or sensory deficits. Steady, even gait. C2-C12 intact.  PSYCH/MENTAL STATUS: Alert, oriented x 3. Cooperative, appropriate mood and affect.    Health Maintenance Due  Topic Date Due   HEMOGLOBIN A1C  Never done   FOOT EXAM  Never done   OPHTHALMOLOGY EXAM  Never done   Diabetic kidney evaluation - Urine ACR  Never done   DTaP/Tdap/Td (1 - Tdap) Never done   Zoster Vaccines- Shingrix (1 of 2) Never done   Cervical Cancer Screening (HPV/Pap Cotest)  11/07/2018    No results found for any visits on 08/24/23.     Assessment & Plan:  1. Encounter to establish [place] with new doctor Patient requesting AE and  will return for fasting labs and AE and pap within 4-6 weeks. Discussed need for records and will obtain release from [facility] (prior PCP) to update our records of HM, disease hx, and vaccine record.   2. Elevated TSH Slightly elevated Per chart review. Pt denies symptoms of hyper/hypothyroid disease. Will check with fasting labs.  - TSH; Future  3. Type 2 diabetes mellitus without complication, without long-term current use of insulin (HCC) Lipids managed by Cardiology and she has appt this week. Will evaluate fasting labs for glucose control and obtain urine albumin. Continue on Farxiga at this time. Discussed diet and exercise regimen .   - CBC with  Differential/Platelet; Future - Comprehensive metabolic panel; Future - Hemoglobin A1c; Future - Microalbumin / creatinine urine ratio; Future  4. Anxiety and depression Uncontrolled currently and likely contributing to her GI upset. Continue Effexor 75mg  BID and start Welbutrin 150mg  XL. Continue therapy as directed as well. Safety plan reviewed with patient. Will follow up in 4 weeks or sooner as needed.    Patient to reach out to office if new, worrisome, or unresolved symptoms arise or if no improvement in patient's condition. Patient verbalized understanding and is agreeable to treatment plan. All questions answered to patient's satisfaction.    Return in about 4 weeks (around 09/21/2023) for ANNUAL PHYSICAL (fasting labs before) .   Yolanda Manges, FNP

## 2023-08-24 NOTE — Patient Instructions (Signed)
Get your Flu shot at your local pharmacy.

## 2023-08-26 ENCOUNTER — Ambulatory Visit: Payer: Medicaid Other | Attending: Physician Assistant | Admitting: Physician Assistant

## 2023-08-26 ENCOUNTER — Encounter: Payer: Self-pay | Admitting: Physician Assistant

## 2023-08-26 VITALS — BP 136/82 | HR 80 | Ht 66.0 in | Wt 159.2 lb

## 2023-08-26 DIAGNOSIS — I10 Essential (primary) hypertension: Secondary | ICD-10-CM

## 2023-08-26 DIAGNOSIS — I25118 Atherosclerotic heart disease of native coronary artery with other forms of angina pectoris: Secondary | ICD-10-CM

## 2023-08-26 DIAGNOSIS — R0609 Other forms of dyspnea: Secondary | ICD-10-CM

## 2023-08-26 DIAGNOSIS — I714 Abdominal aortic aneurysm, without rupture, unspecified: Secondary | ICD-10-CM

## 2023-08-26 DIAGNOSIS — I739 Peripheral vascular disease, unspecified: Secondary | ICD-10-CM

## 2023-08-26 DIAGNOSIS — E785 Hyperlipidemia, unspecified: Secondary | ICD-10-CM

## 2023-08-26 NOTE — Patient Instructions (Signed)
Medication Instructions:  Your Physician recommend you continue on your current medication as directed.    *If you need a refill on your cardiac medications before your next appointment, please call your pharmacy*  Lab Work: None ordered  Testing/Procedures: Your physician has requested that you have an echocardiogram. Echocardiography is a painless test that uses sound waves to create images of your heart. It provides your doctor with information about the size and shape of your heart and how well your heart's chambers and valves are working.   You may receive an ultrasound enhancing agent through an IV if needed to better visualize your heart during the echo. This procedure takes approximately one hour.  There are no restrictions for this procedure.  This will take place at [address] [facility], [place] [address]  Please note: We ask at that you not bring children with you during ultrasound (echo/ vascular) testing. Due to room size and safety concerns, children are not allowed in the ultrasound rooms during exams. Our front office staff cannot provide observation of children in our lobby area while testing is being conducted. An adult accompanying a patient to their appointment will only be allowed in the ultrasound room at the discretion of the ultrasound technician under special circumstances. We apologize for any inconvenience.   Follow-Up: At [facility], you and your health needs are our priority.  As part of our continuing mission to provide you with exceptional heart care, we have created designated Provider Care Teams.  These Care Teams include your primary Cardiologist (physician) and Advanced Practice Providers (APPs -  Physician Assistants and Nurse Practitioners) who all work together to provide you with the care you need, when you need it.  We recommend signing up for the patient portal called "MyChart".  Sign up information is provided on  this After Visit Summary.  MyChart is used to connect with patients for Virtual Visits (Telemedicine).  Patients are able to view lab/test results, encounter notes, upcoming appointments, etc.  Non-urgent messages can be sent to your provider as well.   To learn more about what you can do with MyChart, go to [email or web address].    Your next appointment:   4 month(s)  Provider:   You may see Julien Nordmann, MD or one of the following Advanced Practice Providers on your designated Care Team:   Eula Listen, [place]

## 2023-09-02 ENCOUNTER — Telehealth: Payer: Self-pay | Admitting: Cardiovascular Disease

## 2023-09-02 MED ORDER — LOSARTAN POTASSIUM 100 MG PO TABS: 100.0000 mg | ORAL_TABLET | Freq: Every day | ORAL | 3 refills | Status: DC

## 2023-09-02 MED ORDER — ROSUVASTATIN CALCIUM 40 MG PO TABS: 40.0000 mg | ORAL_TABLET | Freq: Every day | ORAL | 3 refills | Status: DC

## 2023-09-02 MED ORDER — CLONIDINE HCL 0.2 MG PO TABS: 0.2000 mg | ORAL_TABLET | Freq: Two times a day (BID) | ORAL | 3 refills | Status: AC

## 2023-09-02 MED ORDER — CLOPIDOGREL BISULFATE 75 MG PO TABS: 75.0000 mg | ORAL_TABLET | Freq: Every day | ORAL | 3 refills | Status: DC

## 2023-09-02 MED ORDER — AMLODIPINE BESYLATE 10 MG PO TABS: 10.0000 mg | ORAL_TABLET | Freq: Every day | ORAL | 3 refills | Status: DC

## 2023-09-02 MED ORDER — EZETIMIBE 10 MG PO TABS: 10.0000 mg | ORAL_TABLET | Freq: Every day | ORAL | 3 refills | Status: DC

## 2023-09-02 MED ORDER — METOPROLOL SUCCINATE ER 50 MG PO TB24: 50.0000 mg | ORAL_TABLET | Freq: Every day | ORAL | 3 refills | Status: DC

## 2023-09-02 NOTE — Telephone Encounter (Signed)
Requested Prescriptions   Signed Prescriptions Disp Refills   amLODipine (NORVASC) 10 MG tablet 30 tablet 3    Sig: Take 1 tablet (10 mg total) by mouth daily.    Authorizing Provider: Antonieta Iba    Ordering User: Guerry Minors   cloNIDine (CATAPRES) 0.2 MG tablet 90 tablet 3    Sig: Take 1 tablet (0.2 mg total) by mouth in the morning and at bedtime. Take an extra 0.1 mg for BP >160    Authorizing Provider: Antonieta Iba    Ordering User: Katrinka Blazing, Trey Bebee L   ezetimibe (ZETIA) 10 MG tablet 30 tablet 3    Sig: Take 1 tablet (10 mg total) by mouth daily.    Authorizing Provider: Antonieta Iba    Ordering User: Guerry Minors   losartan (COZAAR) 100 MG tablet 30 tablet 3    Sig: Take 1 tablet (100 mg total) by mouth daily.    Authorizing Provider: Antonieta Iba    Ordering User: Feliberto Harts L   metoprolol succinate (TOPROL-XL) 50 MG 24 hr tablet 30 tablet 3    Sig: Take 1 tablet (50 mg total) by mouth daily. Take with or immediately following a meal.    Authorizing Provider: Antonieta Iba    Ordering User: Katrinka Blazing, Acheron Sugg L   rosuvastatin (CRESTOR) 40 MG tablet 30 tablet 3    Sig: Take 1 tablet (40 mg total) by mouth daily.    Authorizing Provider: Antonieta Iba    Ordering User: Guerry Minors   clopidogrel (PLAVIX) 75 MG tablet 30 tablet 3    Sig: Take 1 tablet (75 mg total) by mouth daily.    Authorizing Provider: Antonieta Iba    Ordering User: Guerry Minors

## 2023-09-02 NOTE — Telephone Encounter (Signed)
*  STAT* If patient is at the pharmacy, call can be transferred to refill team.   1. Which medications need to be refilled? (please list name of each medication and dose if known) amLODipine (NORVASC) 10 MG tablet (Expired)  cloNIDine (CATAPRES) 0.2 MG tablet   Take 1 tablet (0.2 mg total) by mouth in the morning and at bedtime. Take an extra 0.1 mg for BP >160    ezetimibe (ZETIA) 10 MG tablet  losartan (COZAAR) 100 MG tablet  clopidogrel (PLAVIX) 75 MG tablet  metoprolol succinate (TOPROL-XL) 50 MG 24 hr tablet (Expired)  rosuvastatin (CRESTOR) 40 MG tablet (Expired)  2. Would you like to learn more about the convenience, safety, & potential cost savings by using the [facility]?  no   3. Are you open to using the [facility] (Type [facility]. np   4. Which pharmacy/location (including street and city if local pharmacy) is medication to be sent to? [facility] [address] ([address]), [place] - [address] Phone: [phone]  Fax: [phone]       5. Do they need a 30 day or 90 day supply? 30

## 2023-09-02 NOTE — Telephone Encounter (Signed)
Last office visit: 08/26/23 with plan to f/u in 4 months  Next office visit: 12/29/23

## 2023-09-14 ENCOUNTER — Other Ambulatory Visit (HOSPITAL_BASED_OUTPATIENT_CLINIC_OR_DEPARTMENT_OTHER): Payer: Medicaid Other

## 2023-09-14 DIAGNOSIS — R7989 Other specified abnormal findings of blood chemistry: Secondary | ICD-10-CM

## 2023-09-14 DIAGNOSIS — E119 Type 2 diabetes mellitus without complications: Secondary | ICD-10-CM

## 2023-09-15 LAB — CBC WITH DIFFERENTIAL/PLATELET
Basophils Absolute: 0.1 10*3/uL (ref 0.0–0.2)
Basos: 1 %
EOS (ABSOLUTE): 0.4 10*3/uL (ref 0.0–0.4)
Eos: 3 %
Hematocrit: 38.5 % (ref 34.0–46.6)
Hemoglobin: 12.3 g/dL (ref 11.1–15.9)
Immature Grans (Abs): 0 10*3/uL (ref 0.0–0.1)
Immature Granulocytes: 0 %
Lymphocytes Absolute: 3.4 10*3/uL — ABNORMAL HIGH (ref 0.7–3.1)
Lymphs: 31 %
MCH: 27.2 pg (ref 26.6–33.0)
MCHC: 31.9 g/dL (ref 31.5–35.7)
MCV: 85 fL (ref 79–97)
Monocytes Absolute: 0.8 10*3/uL (ref 0.1–0.9)
Monocytes: 7 %
Neutrophils Absolute: 6.3 10*3/uL (ref 1.4–7.0)
Neutrophils: 58 %
Platelets: 522 10*3/uL — ABNORMAL HIGH (ref 150–450)
RBC: 4.52 x10E6/uL (ref 3.77–5.28)
RDW: 15.8 % — ABNORMAL HIGH (ref 11.7–15.4)
WBC: 11 10*3/uL — ABNORMAL HIGH (ref 3.4–10.8)

## 2023-09-15 LAB — HEMOGLOBIN A1C
Est. average glucose Bld gHb Est-mCnc: 146 mg/dL
Hgb A1c MFr Bld: 6.7 % — ABNORMAL HIGH (ref 4.8–5.6)

## 2023-09-15 LAB — COMPREHENSIVE METABOLIC PANEL
ALT: 21 [IU]/L (ref 0–32)
AST: 22 [IU]/L (ref 0–40)
Albumin: 4.7 g/dL (ref 3.8–4.9)
Alkaline Phosphatase: 118 [IU]/L (ref 44–121)
BUN/Creatinine Ratio: 14 (ref 9–23)
BUN: 10 mg/dL (ref 6–24)
Bilirubin Total: 0.5 mg/dL (ref 0.0–1.2)
CO2: 22 mmol/L (ref 20–29)
Calcium: 9.7 mg/dL (ref 8.7–10.2)
Chloride: 103 mmol/L (ref 96–106)
Creatinine, Ser: 0.72 mg/dL (ref 0.57–1.00)
Globulin, Total: 2.4 g/dL (ref 1.5–4.5)
Glucose: 120 mg/dL — ABNORMAL HIGH (ref 70–99)
Potassium: 4.1 mmol/L (ref 3.5–5.2)
Sodium: 142 mmol/L (ref 134–144)
Total Protein: 7.1 g/dL (ref 6.0–8.5)
eGFR: 97 mL/min/{1.73_m2} (ref >59)

## 2023-09-15 LAB — MICROALBUMIN / CREATININE URINE RATIO
Creatinine, Urine: 44.2 mg/dL
Microalb/Creat Ratio: 30 mg/g{creat} — ABNORMAL HIGH (ref 0–29)
Microalbumin, Urine: 13.2 ug/mL

## 2023-09-15 LAB — TSH: TSH: 1.94 u[IU]/mL (ref 0.450–4.500)

## 2023-09-16 ENCOUNTER — Ambulatory Visit: Payer: Medicaid Other | Attending: Physician Assistant

## 2023-09-16 DIAGNOSIS — I10 Essential (primary) hypertension: Secondary | ICD-10-CM | POA: Diagnosis not present

## 2023-09-17 LAB — ECHOCARDIOGRAM COMPLETE
Area-P 1/2: 3.08 cm2
S' Lateral: 2.9 cm

## 2023-09-21 ENCOUNTER — Ambulatory Visit (HOSPITAL_BASED_OUTPATIENT_CLINIC_OR_DEPARTMENT_OTHER): Payer: Medicaid Other | Admitting: Family Medicine

## 2023-09-21 ENCOUNTER — Encounter (HOSPITAL_BASED_OUTPATIENT_CLINIC_OR_DEPARTMENT_OTHER): Payer: Self-pay | Admitting: Family Medicine

## 2023-09-21 VITALS — BP 136/88 | HR 68 | Temp 98.2°F | Ht 66.0 in | Wt 169.8 lb

## 2023-09-21 DIAGNOSIS — Z0000 Encounter for general adult medical examination without abnormal findings: Secondary | ICD-10-CM | POA: Diagnosis not present

## 2023-09-21 DIAGNOSIS — D72820 Lymphocytosis (symptomatic): Secondary | ICD-10-CM

## 2023-09-21 DIAGNOSIS — B9689 Other specified bacterial agents as the cause of diseases classified elsewhere: Secondary | ICD-10-CM

## 2023-09-21 DIAGNOSIS — F32A Depression, unspecified: Secondary | ICD-10-CM

## 2023-09-21 DIAGNOSIS — F419 Anxiety disorder, unspecified: Secondary | ICD-10-CM

## 2023-09-21 DIAGNOSIS — Z1231 Encounter for screening mammogram for malignant neoplasm of breast: Secondary | ICD-10-CM | POA: Diagnosis not present

## 2023-09-21 DIAGNOSIS — J019 Acute sinusitis, unspecified: Secondary | ICD-10-CM

## 2023-09-21 DIAGNOSIS — E119 Type 2 diabetes mellitus without complications: Secondary | ICD-10-CM | POA: Diagnosis not present

## 2023-09-21 DIAGNOSIS — D75838 Other thrombocytosis: Secondary | ICD-10-CM | POA: Insufficient documentation

## 2023-09-21 MED ORDER — AZITHROMYCIN 250 MG PO TABS: ORAL_TABLET | ORAL | 0 refills | Status: AC

## 2023-09-21 MED ORDER — DAPAGLIFLOZIN PROPANEDIOL 10 MG PO TABS: 10.0000 mg | ORAL_TABLET | Freq: Every day | ORAL | 5 refills | Status: DC

## 2023-09-21 MED ORDER — GABAPENTIN 600 MG PO TABS: 600.0000 mg | ORAL_TABLET | Freq: Three times a day (TID) | ORAL | 5 refills | Status: AC | PRN

## 2023-09-21 MED ORDER — VENLAFAXINE HCL 75 MG PO TABS: 75.0000 mg | ORAL_TABLET | Freq: Two times a day (BID) | ORAL | 5 refills | Status: AC

## 2023-09-21 NOTE — Patient Instructions (Addendum)
Please obtain your Tdap (tetanus) and Shingrix vaccines at your local pharmacy.   Please schedule Lipid Clinic Appt at these other locations:   [facility] at [facility] [phone] [address] [place], [place] [address]  [facility] [phone] [address] [place], [place] [address]   Things to do to keep yourself healthy: - Exercise at least 30-45 minutes a day, 3-4 days a week.  - Eat a low-fat diet with lots of fruits and vegetables, up to 7-9 servings per day.  - Seatbelts can save your life. Wear them always.  - Smoke detectors on every level of your home, check batteries every year.  - Eye Doctor - have an eye exam every 1-2 years  - Safe sex - if you may be exposed to STDs, use a condom.  - No smoking, vaping, or use of any tobacco products.  - Alcohol -  If you drink, do it moderately, less than 2 drinks per day.  - No illegal drug use.  - Depression is common in our stressful world.If you're feeling down or losing interest in things you normally enjoy, please come in for a visit.  - Violence - If anyone is threatening or hurting you, please call immediately.

## 2023-09-21 NOTE — Progress Notes (Signed)
Subjective:   Heather Mahoney [birth year]  09/21/2023   CC: Chief Complaint  Patient presents with   Annual Exam    Annual Exam. Pap was 02/16/2023 from [place].     HPI: Heather Mahoney is a 57 y.o. female who presents for a routine health maintenance exam.  Labs collected at time of visit.    URI SYMPTOMS: Onset: Several Months, worsened in the past several weeks.   Fever: No Runny nose: Yes Nasal congestion: Yes  Sinus pressure: Yes, sinus headache current intermittently Post nasal drip: Yes Cough: No Ear pain: Yes, feels full bilaterally Sore throat: No   Treatments tried: OTC Medications   Recent sick contacts: Denies.    ANXIETY: Heather Mahoney presents for the medical management of anxiety.  Current medication regimen: Welbutrin - she only took once and stopped due to emotional suppression per patient. Would like to Effexor 75mg . She feels the Effexor is working well for her currently and would like to continue on current regimen.  Counseling: Recommended Well controlled: Yes, currently with Effexor only per patient.  Denies SI/HI.      08/24/2023    9:43 AM  GAD 7 : Generalized Anxiety Score  Nervous, Anxious, on Edge 1  Control/stop worrying 1  Worry too much - different things 1  Trouble relaxing 1  Restless 1  Easily annoyed or irritable 1  Afraid - awful might happen 3  Total GAD 7 Score 9  Anxiety Difficulty Somewhat difficult      08/24/2023    9:37 AM 01/09/2016    9:26 AM 12/12/2015    2:32 PM 11/29/2015    8:41 AM  Depression screen PHQ 2/9  Decreased Interest 0 0 0 0  Down, Depressed, Hopeless 0 0 0 0  PHQ - 2 Score 0 0 0 0  Altered sleeping 3     Tired, decreased energy 2     Change in appetite 3     Feeling bad or failure about yourself  0     Trouble concentrating 2     Moving slowly or fidgety/restless 0     Suicidal thoughts 0     PHQ-9 Score 10     Difficult doing work/chores Somewhat difficult        DIABETES  MELLITUS: Heather Mahoney presents for the medical management of diabetes.  Current diabetes medication regimen: Farxiga 10mg  QD Patient is  adhering to a diabetic diet.  Patient is  exercising regularly.  Patient is not checking BS regularly.  Patient is  checking their feet regularly.  Denies polydipsia, polyphagia, polyuria, open wounds or ulcers on feet.   Lab Results  Component Value Date   HGBA1C 6.7 (H) 09/14/2023    09/21/2023 Foot Exam Completed  Lab Results  Component Value Date   LABMICR 13.2 09/14/2023    Wt Readings from Last 3 Encounters:  09/21/23 169 lb 12.8 oz (77 kg)  08/26/23 159 lb 3.2 oz (72.2 kg)  08/24/23 159 lb 3.2 oz (72.2 kg)    HEALTH SCREENINGS: - Vision Screening: up to date - Dental Visits: up to date - Pap smear: Completed Shari 2024 w/ prevous PCP per patient; Will obtain records - Breast Exam: up to date - STD Screening: Not applicable - Mammogram (40+): Ordered today  - Colonoscopy (45+): Up to date  - Bone Density (65+ or under 65 with predisposing conditions): Not applicable  - Lung CA screening with low-dose CT:  Not applicable Adults age  50-80 who are current cigarette smokers or quit within the last 15 years. Must have 20 pack year history.   Depression and Anxiety Screen done today and results listed below:     08/24/2023    9:37 AM 01/09/2016    9:26 AM 12/12/2015    2:32 PM 11/29/2015    8:41 AM  Depression screen PHQ 2/9  Decreased Interest 0 0 0 0  Down, Depressed, Hopeless 0 0 0 0  PHQ - 2 Score 0 0 0 0  Altered sleeping 3     Tired, decreased energy 2     Change in appetite 3     Feeling bad or failure about yourself  0     Trouble concentrating 2     Moving slowly or fidgety/restless 0     Suicidal thoughts 0     PHQ-9 Score 10     Difficult doing work/chores Somewhat difficult         08/24/2023    9:43 AM  GAD 7 : Generalized Anxiety Score  Nervous, Anxious, on Edge 1  Control/stop worrying 1  Worry too much -  different things 1  Trouble relaxing 1  Restless 1  Easily annoyed or irritable 1  Afraid - awful might happen 3  Total GAD 7 Score 9  Anxiety Difficulty Somewhat difficult    IMMUNIZATIONS: - Tdap: Tetanus vaccination status reviewed: Declined. - HPV: Not applicable - Influenza: Refused - Pneumovax: Not applicable - Prevnar 20: Not applicable - Shingrix (50+): Refused   Past medical history, surgical history, medications, allergies, family history and social history reviewed with patient today and changes made to appropriate areas of the chart.   Past Medical History:  Diagnosis Date   Anxiety    Arthritis    CAD (coronary artery disease)    a. PCI to proximal and distal RCA 3/26   Cancer [facility])    Mom grandma   Chronic kidney disease    Mom   Depression 2023   Diabetes mellitus without complication (HCC)    GERD (gastroesophageal reflux disease)    Heart murmur    MR   Hiatal hernia    Hot flashes    Hyperlipidemia    Hypertension    Myocardial infarction (HCC)    Neuropathy    Thyroid disease    Mom   Tobacco abuse    a. Started at age 26, quit during 3 pregnancies. b. 1 PPD for a 40 pack-year hx (2019)     Past Surgical History:  Procedure Laterality Date   ABDOMINAL AORTIC ANEURYSM REPAIR     2019   BREAST BIOPSY Right    Multiple biopsies-all benign   BREAST SURGERY  1986   I&D for 'milk duct'   COLONOSCOPY WITH PROPOFOL N/A 07/07/2022   Procedure: COLONOSCOPY WITH PROPOFOL;  Surgeon: Wyline Mood, MD;  Location: [facility] ENDOSCOPY;  Service: Gastroenterology;  Laterality: N/A;   CORONARY STENT INTERVENTION N/A 01/13/2019   Procedure: CORONARY STENT INTERVENTION;  Surgeon: Yvonne Kendall, MD;  Location: [facility] INVASIVE CV LAB;  Service: Cardiovascular;  Laterality: N/A;   EMBOLECTOMY  08/13/2018   Procedure: Right SFA and profunda femoris Fogarty embolectomy 3  Fogarty embolectomy balloon;  Surgeon: Annice Needy, MD;  Location: [facility] ORS;  Service:  Vascular;;   ENDARTERECTOMY FEMORAL Right 08/13/2018   Procedure: Right common femoral, profunda femoris, and superficial femoral artery endarterectomies and patch angioplasty ;  Surgeon: Annice Needy, MD;  Location: [facility] ORS;  Service: Vascular;  Laterality: Right;   ENDOVASCULAR REPAIR/STENT GRAFT N/A 08/13/2018   Procedure: ENDOVASCULAR REPAIR/STENT GRAFT;  Surgeon: Renford Dills, MD;  Location: [facility] INVASIVE CV LAB;  Service: Cardiovascular;  Laterality: N/A;   LEFT HEART CATH AND CORONARY ANGIOGRAPHY Left 01/13/2019   Procedure: LEFT HEART CATH AND CORONARY ANGIOGRAPHY;  Surgeon: Antonieta Iba, MD;  Location: [facility] INVASIVE CV LAB;  Service: Cardiovascular;  Laterality: Left;   LEFT HEART CATH AND CORONARY ANGIOGRAPHY Left 07/24/2023   Procedure: LEFT HEART CATH AND CORONARY ANGIOGRAPHY;  Surgeon: Iran Ouch, MD;  Location: [facility] INVASIVE CV LAB;  Service: Cardiovascular;  Laterality: Left;   LOWER EXTREMITY ANGIOGRAPHY Right 09/08/2022   Procedure: Lower Extremity Angiography;  Surgeon: Annice Needy, MD;  Location: [facility] INVASIVE CV LAB;  Service: Cardiovascular;  Laterality: Right;   LOWER EXTREMITY ANGIOGRAPHY Right 09/15/2022   Procedure: Lower Extremity Angiography;  Surgeon: Annice Needy, MD;  Location: [facility] INVASIVE CV LAB;  Service: Cardiovascular;  Laterality: Right;   SALPINGOOPHORECTOMY Left 2000    Current Outpatient Medications on File Prior to Visit  Medication Sig   amLODipine (NORVASC) 10 MG tablet Take 1 tablet (10 mg total) by mouth daily.   aspirin 81 MG EC tablet Take 1 tablet (81 mg total) by mouth daily.   cloNIDine (CATAPRES) 0.2 MG tablet Take 1 tablet (0.2 mg total) by mouth in the morning and at bedtime. Take an extra 0.1 mg for BP >160   clopidogrel (PLAVIX) 75 MG tablet Take 1 tablet (75 mg total) by mouth daily.   ezetimibe (ZETIA) 10 MG tablet Take 1 tablet (10 mg total) by mouth daily.   losartan (COZAAR) 100 MG tablet Take 1 tablet (100 mg  total) by mouth daily.   metoprolol succinate (TOPROL-XL) 50 MG 24 hr tablet Take 1 tablet (50 mg total) by mouth daily. Take with or immediately following a meal.   pantoprazole (PROTONIX) 40 MG tablet Take 1 tablet (40 mg total) by mouth daily.   rosuvastatin (CRESTOR) 40 MG tablet Take 1 tablet (40 mg total) by mouth daily.   No current facility-administered medications on file prior to visit.    Allergies  Allergen Reactions   Strawberry Extract Hives and Swelling   Strawberry Flavor Rash     Social History   Socioeconomic History   Marital status: Single    Spouse name: Not on file   Number of children: 3   Years of education: Not on file   Highest education level: GED or equivalent  Occupational History   Not on file  Tobacco Use   Smoking status: Former    Current packs/day: 0.00    Average packs/day: 0.3 packs/day for 38.0 years (9.5 ttl pk-yrs)    Types: Cigarettes    Start date: 08/10/1980    Quit date: 08/10/2018    Years since quitting: 5.1    Passive exposure: Past   Smokeless tobacco: Never   Tobacco comments:    Quit 08-10-2018  Vaping Use   Vaping status: Never Used  Substance and Sexual Activity   Alcohol use: Not Currently    Comment: occassional,none last 24hrs   Drug use: Not Currently    Types: Marijuana    Comment: Smoke caused severe chest pain stopped immediately   Sexual activity: Yes    Birth control/protection: Post-menopausal  Other Topics Concern   Not on file  Social History Narrative   Lives with youngest daughter Joslyn Devon. 2 Sons lives in [place]. 2 indoor dogs.  Social Determinants of Health   Financial Resource Strain: Low Risk  (08/23/2023)   Overall Financial Resource Strain (CARDIA)    Difficulty of Paying Living Expenses: Not hard at all  Food Insecurity: No Food Insecurity (08/23/2023)   Hunger Vital Sign    Worried About Running Out of Food in the Last Year: Never true    Ran Out of Food in the Last Year: Never true   Transportation Needs: No Transportation Needs (08/23/2023)   PRAPARE - Administrator, Civil Service (Medical): No    Lack of Transportation (Non-Medical): No  Physical Activity: Insufficiently Active (08/23/2023)   Exercise Vital Sign    Days of Exercise per Week: 2 days    Minutes of Exercise per Session: 10 min  Stress: Stress Concern Present (08/23/2023)   [employer] of Occupational Health - Occupational Stress Questionnaire    Feeling of Stress : Very much  Social Connections: Socially Isolated (08/23/2023)   Social Connection and Isolation Panel [NHANES]    Frequency of Communication with Friends and Family: More than three times a week    Frequency of Social Gatherings with Friends and Family: Never    Attends Religious Services: Never    Database administrator or Organizations: No    Attends Engineer, structural: Not on file    Marital Status: Separated  Intimate Partner Violence: Not At Risk (05/23/2021)   Humiliation, Afraid, Rape, and Kick questionnaire    Fear of Current or Ex-Partner: No    Emotionally Abused: No    Physically Abused: No    Sexually Abused: No   Social History   Tobacco Use  Smoking Status Former   Current packs/day: 0.00   Average packs/day: 0.3 packs/day for 38.0 years (9.5 ttl pk-yrs)   Types: Cigarettes   Start date: 08/10/1980   Quit date: 08/10/2018   Years since quitting: 5.1   Passive exposure: Past  Smokeless Tobacco Never  Tobacco Comments   Quit 08-10-2018   Social History   Substance and Sexual Activity  Alcohol Use Not Currently   Comment: occassional,none last 24hrs    Family History  Problem Relation Age of Onset   Breast cancer Mother    Diabetes Mother    Hypertension Mother    Hyperlipidemia Mother    Heart disease Mother        CABG X 4   Asthma Mother 104   Sarcoidosis Mother        In remission   Heart attack Mother    Breast cancer Maternal Grandmother 38   Diabetes Maternal  Grandmother    Heart disease Maternal Grandmother    Hyperlipidemia Maternal Grandmother      ROS: Denies fever, fatigue, unexplained weight loss/gain, chest pain, SHOB, and palpitations. Denies neurological deficits, gastrointestinal or genitourinary complaints, and skin changes.   Objective:   Today's Vitals   09/21/23 0947 09/21/23 1032  BP: (!) 132/90 (!) 149/109  Pulse: 68   Temp: 98.2 F (36.8 C)   TempSrc: Oral   SpO2: 100%   Weight: 169 lb 12.8 oz (77 kg)   Height: 5\' 6"  (1.676 m)   PainSc: 0-No pain     GENERAL APPEARANCE: Well-appearing, in NAD. Well nourished.  SKIN: Pink, warm and dry. Turgor normal. No rash, lesion, ulceration, or ecchymoses. Hair evenly distributed.  HEENT: HEAD: Normocephalic.  EYES: PERRLA. EOMI. Lids intact w/o defect. Sclera white, Conjunctiva pink w/o exudate.  EARS: External ear w/o redness, swelling, masses or  lesions. EAC clear. TM's intact, translucent w/o bulging, appropriate landmarks visualized. Appropriate acuity to conversational tones.  NOSE: Septum midline w/o deformity. Nares patent, mucosa pink and non-inflamed w/o drainage. No sinus tenderness.  THROAT: Uvula midline. Oropharynx clear. Tonsils non-inflamed w/o exudate. Oral mucosa pink and moist.  NECK: Supple, Trachea midline. Full ROM w/o pain or tenderness. No lymphadenopathy. Thyroid non-tender w/o enlargement or palpable masses.  BREASTS: Breasts pendulous, symmetrical, and w/o palpable masses. Nipples everted and w/o discharge. No rash or skin retraction. No axillary or supraclavicular lymphadenopathy.  RESPIRATORY: Chest wall symmetrical w/o masses. Respirations even and non-labored. Breath sounds clear to auscultation bilaterally. No wheezes, rales, rhonchi, or crackles. CARDIAC: S1, S2 present, regular rate and rhythm. No gallops, murmurs, rubs, or clicks. PMI w/o lifts, heaves, or thrills. No carotid bruits. Capillary refill <2 seconds. Peripheral pulses 2+  bilaterally. GI: Abdomen soft w/o distention. Normoactive bowel sounds. No palpable masses or tenderness. No guarding or rebound tenderness. Liver and spleen w/o tenderness or enlargement. No CVA tenderness.  GU: External genitalia without erythema, lesions, or masses. No lymphadenopathy. Vaginal mucosa pink and moist without exudate, lesions, or ulcerations. Cervix pink without discharge. Cervical os closed. Uterus and adnexae palpable, not enlarged, and w/o tenderness. No palpable masses.  MSK: Muscle tone and strength appropriate for age, w/o atrophy or abnormal movement.  EXTREMITIES: Active ROM intact, w/o tenderness, crepitus, or contracture. No obvious joint deformities or effusions. No clubbing, edema, or cyanosis.  NEUROLOGIC: CN's II-XII intact. Motor strength symmetrical with no obvious weakness. No sensory deficits. DTR's 2+ symmetric bilaterally. Steady, even gait.  PSYCH/MENTAL STATUS: Alert, oriented x 3. Cooperative, appropriate mood and affect.   Chaperoned by Cristy Hilts, CMA   Results for orders placed or performed in visit on 09/16/23  ECHOCARDIOGRAM COMPLETE  Result Value Ref Range   S' Lateral 2.90 cm   Area-P 1/2 3.08 cm2   Est EF 60 - 65%     Assessment & Plan:  1. Annual physical exam Discussed patient's lab results and preventative screenings. Will obtain record of Pap to update health maintenance from previous PCP Patient declines  routine vaccines.We discussed the risk of this means means there could be underlying issue or malignancy not identified, increased morbidity, and even mortality. Patient verbalized understanding and acceptance of risk. Patient knows they can change their mind at any time and we will be happy to coordinate these things for them.    2. Type 2 diabetes mellitus without complication, without long-term current use of insulin (HCC) Well controlled. Continue Farxiga 10mg  as directed. Discussed diet w/ decrease in sugar, carbohydrate intake and  regular exercise as tolerated. Repeat in 6 months. Foot exam completed today. Discussed urine albumin result and recommend monitoring glucose, increase clear fluids, and monitor protein.   3. Anxiety and depression Stable per patient. Safety plan reviewed. Counseling recommended. Will continue on Effexor as directed and follow up as needed.   4. Breast cancer screening by mammogram - MM 3D SCREENING MAMMOGRAM BILATERAL BREAST; Future  5. Acute bacterial sinusitis Start Zpack x 5 days. Recommend using antihistamine for nasal congestion. Reach out to PCP if no improvement in 48 hours.   6. Lymphocytosis 7. Reactive thrombocytosis Chronic per chart review. Patient has seen hematology in the past and etiology thought to be reactive.    Orders Placed This Encounter  Procedures   MM 3D SCREENING MAMMOGRAM BILATERAL BREAST    Standing Status:   Future    Standing Expiration Date:   09/20/2024  Order Specific Question:   Reason for Exam (SYMPTOM  OR DIAGNOSIS REQUIRED)    Answer:   Routine Breast Cancer Screening.    Order Specific Question:   Is the patient pregnant?    Answer:   No    Order Specific Question:   Preferred imaging location?    Answer:   [facility]    PATIENT COUNSELING:  - Encouraged a healthy well-balanced diet. Patient may adjust caloric intake to maintain or achieve ideal body weight. May reduce intake of dietary saturated fat and total fat and have adequate dietary potassium and calcium preferably from fresh fruits, vegetables, and low-fat dairy products.   - Advised to avoid cigarette smoking. - Discussed with the patient that most people either abstain from alcohol or drink within safe limits (<=14/week and <=4 drinks/occasion for males, <=7/weeks and <= 3 drinks/occasion for females) and that the risk for alcohol disorders and other health effects rises proportionally with the number of drinks per week and how often a drinker exceeds daily limits. - Discussed  cessation/primary prevention of drug use and availability of treatment for abuse.  - Discussed sexually transmitted diseases, avoidance of unintended pregnancy and contraceptive alternatives.  - Stressed the importance of regular exercise - Injury prevention: Discussed safety belts, safety helmets, smoke detector, smoking near bedding or upholstery.  - Dental health: Discussed importance of regular tooth brushing, flossing, and dental visits.   NEXT PREVENTATIVE PHYSICAL DUE IN 1 YEAR.  Return in about 6 months (around 03/21/2024) for Follow up Diabetes, Anxiety (labs fasting prior) .  Patient to reach out to office if new, worrisome, or unresolved symptoms arise or if no improvement in patient's condition. Patient verbalized understanding and is agreeable to treatment plan. All questions answered to patient's satisfaction.    Hilbert Bible, [place]

## 2023-09-23 ENCOUNTER — Telehealth: Payer: Self-pay | Admitting: Cardiovascular Disease

## 2023-09-23 NOTE — Telephone Encounter (Signed)
Will they pay for Praluent or Nexletol?

## 2023-09-23 NOTE — Telephone Encounter (Signed)
Pt c/o medication issue:  1. Name of Medication: rosuvastatin (CRESTOR) 40 MG tablet  ezetimibe (ZETIA) 10 MG tablet   2. How are you currently taking this medication (dosage and times per day)?   3. Are you having a reaction (difficulty breathing--STAT)?   4. What is your medication issue? Patient is calling to see if there is an alternative for these medications since she states they are not working and she is unable to be seen by Dr. Rennis Golden until March currently.

## 2023-09-23 NOTE — Telephone Encounter (Signed)
Pt called and reports she is still taking the Crestor 40 and Zetia 10 daily and medicaid won't pay for this Repatha.  Also she reports se can't get into the "high cholesterol clinic" until mid-March and won't see you until that same time.  Can we pivot to another med and then do some blood work a month after she starts it?

## 2023-09-24 ENCOUNTER — Other Ambulatory Visit (HOSPITAL_COMMUNITY): Payer: Self-pay

## 2023-09-24 ENCOUNTER — Telehealth: Payer: Self-pay | Admitting: Pharmacy Technician

## 2023-09-24 NOTE — Telephone Encounter (Signed)
Would you please check if PA is needed for [employer] - pt on Medicaid with familial hyperlipidemia

## 2023-09-24 NOTE — Telephone Encounter (Signed)
Pharmacy Patient Advocate Encounter  Received notification from [facility] Medicaid that Prior Authorization for repatha has been APPROVED from 09/24/23 to 09/23/24. Ran test claim, Copay is $4.00 one month. This test claim was processed through [facility]- copay amounts may vary at other pharmacies due to pharmacy/plan contracts, or as the patient moves through the different stages of their insurance plan.   PA #/Case ID/Reference #: [id number]

## 2023-09-24 NOTE — Telephone Encounter (Signed)
Pharmacy Patient Advocate Encounter   Received notification from Pt Calls Messages that prior authorization for repatha is required/requested.   Insurance verification completed.   The patient is insured through [facility] .   Per test claim: PA required; PA submitted to above mentioned insurance via CoverMyMeds Key/confirmation #/EOC [facility] Status is pending

## 2023-09-28 ENCOUNTER — Other Ambulatory Visit (HOSPITAL_BASED_OUTPATIENT_CLINIC_OR_DEPARTMENT_OTHER): Payer: Self-pay | Admitting: Family Medicine

## 2023-09-30 MED ORDER — REPATHA SURECLICK 140 MG/ML ~~LOC~~ SOAJ: 140.0000 mg | SUBCUTANEOUS | 3 refills | Status: DC

## 2023-10-19 ENCOUNTER — Encounter: Payer: Self-pay | Admitting: Emergency Medicine

## 2023-12-29 ENCOUNTER — Ambulatory Visit: Payer: Medicaid Other | Admitting: Physician Assistant

## 2024-01-04 ENCOUNTER — Institutional Professional Consult (permissible substitution): Payer: Medicaid Other | Admitting: Internal Medicine

## 2024-02-08 ENCOUNTER — Ambulatory Visit: Attending: Physician Assistant | Admitting: Physician Assistant

## 2024-02-08 ENCOUNTER — Encounter: Payer: Self-pay | Admitting: Physician Assistant

## 2024-02-08 VITALS — BP 136/80 | HR 72 | Ht 66.0 in | Wt 174.8 lb

## 2024-02-08 DIAGNOSIS — I10 Essential (primary) hypertension: Secondary | ICD-10-CM

## 2024-02-08 DIAGNOSIS — I714 Abdominal aortic aneurysm, without rupture, unspecified: Secondary | ICD-10-CM | POA: Diagnosis not present

## 2024-02-08 DIAGNOSIS — I25118 Atherosclerotic heart disease of native coronary artery with other forms of angina pectoris: Secondary | ICD-10-CM | POA: Diagnosis not present

## 2024-02-08 DIAGNOSIS — E785 Hyperlipidemia, unspecified: Secondary | ICD-10-CM | POA: Diagnosis not present

## 2024-02-08 DIAGNOSIS — I739 Peripheral vascular disease, unspecified: Secondary | ICD-10-CM

## 2024-02-08 NOTE — Patient Instructions (Signed)
 Medication Instructions:  Your Physician recommend you continue on your current medication as directed.    *If you need a refill on your cardiac medications before your next appointment, please call your pharmacy*  Lab Work: None ordered at this time   Follow-Up: At [facility], you and your health needs are our priority.  As part of our continuing mission to provide you with exceptional heart care, our providers are all part of one team.  This team includes your primary Cardiologist (physician) and Advanced Practice Providers or APPs (Physician Assistants and Nurse Practitioners) who all work together to provide you with the care you need, when you need it.  Your next appointment:   6 month(s)  Provider:   You may see Julien Nordmann, MD or Eula Listen, PA-C

## 2024-02-08 NOTE — Progress Notes (Signed)
 Cardiology Office Note    Date:  02/08/2024   ID:  Heather Mahoney, DOB 1966-01-09, MRN [id number]  PCP:  Nonda Bays, FNP  Cardiologist:  Belva Boyden, MD  Electrophysiologist:  None   Chief Complaint: Follow-up  History of Present Illness:   Heather Mahoney is a 58 y.o. female with history of CAD status post prior PCI to the RCA x 2 in 12/2018, AAA status post endovascular repair and right femoral endarterectomy due to ischemic right lower extremity in 07/2018 followed by vascular surgery, orthostatic syncope, HTN, HLD, tobacco use, PVCs, and GERD who presents for follow-up of CAD.   She was previously evaluated by Dr. Lina Render for possible syncope in 2017.  Echo at that time showed an EF of 55 to 60%, no regional wall motion abnormalities, normal LV diastolic function, mild mitral regurgitation, normal RV systolic function, normal RVSP, and trivial pericardial effusion.  Treadmill MPI at that time showed no significant ischemia with an EF of 46% felt to be decreased secondary to GI uptake artifact, rare PVCs, good exercise tolerance with the patient achieving 10.1 METs, and was overall low risk.  Outpatient cardiac monitoring showed a predominant rhythm of sinus with an average rate of 88 bpm (range 57 to 136 bpm), 10% of the beats were tachycardic, for isolated PACs, and 14,946 isolated PVCs with an overall 12% burden.  She was seen in 11/2018 with exertional dyspnea with subsequent coronary CTA showing hemodynamically significant stenosis in the mid RCA with indeterminate FFR in the proximal LAD and proximal LCx.  In this setting she underwent LHC in 12/2018 that showed ostial LAD 30% stenosis, proximal to mid LCx 40% stenosis, OM1 60% stenosis, proximal RCA 75% stenosis, mid RCA 80% stenosis with normal LVEDP and LVEF of 65%.  She underwent successful PCI/DES x 2 to the RCA.  Echo in 02/2019 showed an EF of 50 to 55%, unable to exclude hypokinesis of the inferior wall, diastolic  dysfunction, and mild mitral regurgitation.   She was seen in the ED in 03/2023 with chest pain with negative high-sensitivity troponin x 2.  CTA chest negative for PE with a small pericardial effusion and aortic atherosclerosis noted.  She was most recently seen in the office on 07/20/2023 noting exertional chest discomfort and shortness of breath.  In this setting she underwent LHC 07/24/2023 that showed widely patent RCA stents with mild in-stent restenosis, stable mild to moderate LAD and LCx disease, and new 80% stenosis in the right posterior AV groove, however the vessel was small in diameter, at most 2 mm, and was too small to stent.  LVEF 55 to 65% by visual estimate.  LVEDP mildly elevated.   With regards to her AAA and PAD, she is followed by vascular surgery with ABI in 06/2023 normal bilaterally, abdominal ultrasound showing patent endovascular aneurysm repair without evidence of endoleak, and right LEA stent patent without evidence of significant stenosis.  She was last seen in the office in 08/2023 and reported ongoing exertional fatigue and shortness of breath without frank chest pain.  She reported some dietary indiscretion that had improved.  She was initiated on Repatha  with continuation of rosuvastatin  and ezetimibe .  Echo in 08/2023 showed an EF of 60 to 65%, no regional wall motion abnormalities, grade 1 diastolic dysfunction, normal RV systolic function and ventricular cavity size, mild mitral regurgitation, mild to moderate tricuspid regurgitation, and an estimated right atrial pressure of 3 mmHg.  She comes in doing well from a  cardiac perspective and is without symptoms of angina or cardiac decompensation.  No dizziness, presyncope, or syncope.  She has been under increased stress lately.  She notes if she takes some of her medications at nighttime she sleeps better throughout the evening.  She has had some dietary indiscretion.  Tolerating Repatha  without off target effect.  Also  remains on ezetimibe  and rosuvastatin .  No falls or symptoms concerning for bleeding.   Labs independently reviewed: 08/2023 - Hgb 12.3, PLT 522, BUN 10, serum creatinine 0.72, potassium 4.1, albumin 4.7, AST/ALT normal, A1c 6.7, TSH normal 06/2023 - TC 321, TG 189, HDL 46, LDL 238  Past Medical History:  Diagnosis Date   Anxiety    Arthritis    CAD (coronary artery disease)    a. PCI to proximal and distal RCA 3/26   [facility]cer [facility])    Mom grandma   Chest pain 04/09/2019   Chronic kidney disease    Mom   Depression 2023   Diabetes mellitus without complication (HCC)    GERD (gastroesophageal reflux disease)    Heart murmur    MR   Hiatal hernia    Hot flashes    Hyperlipidemia    Hypertension    Myocardial infarction (HCC)    Neuropathy    Thyroid  disease    Mom   Tobacco abuse    a. Started at age 18, quit during 3 pregnancies. b. 1 PPD for a 40 pack-year hx (2019)     Past Surgical History:  Procedure Laterality Date   ABDOMINAL AORTIC ANEURYSM REPAIR     2019   BREAST BIOPSY Right    Multiple biopsies-all benign   BREAST SURGERY  1986   I&D for 'milk duct'   COLONOSCOPY WITH PROPOFOL  N/A 07/07/2022   Procedure: COLONOSCOPY WITH PROPOFOL ;  Surgeon: Luke Salaam, MD;  Location: [facility] ENDOSCOPY;  Service: Gastroenterology;  Laterality: N/A;   CORONARY STENT INTERVENTION N/A 01/13/2019   Procedure: CORONARY STENT INTERVENTION;  Surgeon: Sammy Crisp, MD;  Location: [facility] INVASIVE CV LAB;  Service: Cardiovascular;  Laterality: N/A;   EMBOLECTOMY  08/13/2018   Procedure: Right SFA and profunda femoris Fogarty embolectomy 3  Fogarty embolectomy balloon;  Surgeon: Celso College, MD;  Location: [facility] ORS;  Service: Vascular;;   ENDARTERECTOMY FEMORAL Right 08/13/2018   Procedure: Right common femoral, profunda femoris, and superficial femoral artery endarterectomies and patch angioplasty ;  Surgeon: Celso College, MD;  Location: [facility] ORS;  Service: Vascular;  Laterality: Right;    ENDOVASCULAR REPAIR/STENT GRAFT N/A 08/13/2018   Procedure: ENDOVASCULAR REPAIR/STENT GRAFT;  Surgeon: Jackquelyn Mass, MD;  Location: [facility] INVASIVE CV LAB;  Service: Cardiovascular;  Laterality: N/A;   LEFT HEART CATH AND CORONARY ANGIOGRAPHY Left 01/13/2019   Procedure: LEFT HEART CATH AND CORONARY ANGIOGRAPHY;  Surgeon: Devorah Fonder, MD;  Location: [facility] INVASIVE CV LAB;  Service: Cardiovascular;  Laterality: Left;   LEFT HEART CATH AND CORONARY ANGIOGRAPHY Left 07/24/2023   Procedure: LEFT HEART CATH AND CORONARY ANGIOGRAPHY;  Surgeon: Wenona Hamilton, MD;  Location: [facility] INVASIVE CV LAB;  Service: Cardiovascular;  Laterality: Left;   LOWER EXTREMITY ANGIOGRAPHY Right 09/08/2022   Procedure: Lower Extremity Angiography;  Surgeon: Celso College, MD;  Location: [facility] INVASIVE CV LAB;  Service: Cardiovascular;  Laterality: Right;   LOWER EXTREMITY ANGIOGRAPHY Right 09/15/2022   Procedure: Lower Extremity Angiography;  Surgeon: Celso College, MD;  Location: [facility] INVASIVE CV LAB;  Service: Cardiovascular;  Laterality: Right;   SALPINGOOPHORECTOMY Left 2000  Current Medications: Current Meds  Medication Sig   amLODipine  (NORVASC ) 10 MG tablet Take 1 tablet (10 mg total) by mouth daily.   aspirin  81 MG EC tablet Take 1 tablet (81 mg total) by mouth daily.   cloNIDine  (CATAPRES ) 0.2 MG tablet Take 1 tablet (0.2 mg total) by mouth in the morning and at bedtime. Take an extra 0.1 mg for BP >160   clopidogrel  (PLAVIX ) 75 MG tablet Take 1 tablet (75 mg total) by mouth daily.   dapagliflozin  propanediol (FARXIGA ) 10 MG TABS tablet Take 1 tablet (10 mg total) by mouth daily.   Evolocumab  (REPATHA  SURECLICK) 140 MG/ML SOAJ Inject 140 mg into the skin every 14 (fourteen) days.   ezetimibe  (ZETIA ) 10 MG tablet Take 1 tablet (10 mg total) by mouth daily.   gabapentin  (NEURONTIN ) 600 MG tablet Take 1 tablet (600 mg total) by mouth 3 (three) times daily as needed.   losartan  (COZAAR ) 100 MG tablet  Take 1 tablet (100 mg total) by mouth daily.   metoprolol  succinate (TOPROL -XL) 50 MG 24 hr tablet Take 1 tablet (50 mg total) by mouth daily. Take with or immediately following a meal.   pantoprazole  (PROTONIX ) 40 MG tablet Take 1 tablet (40 mg total) by mouth daily.   rosuvastatin  (CRESTOR ) 40 MG tablet Take 1 tablet (40 mg total) by mouth daily.   venlafaxine  (EFFEXOR ) 75 MG tablet Take 1 tablet (75 mg total) by mouth 2 (two) times daily with a meal.    Allergies:   Strawberry extract and Strawberry flavoring agent (non-screening)   Social History   Socioeconomic History   Marital status: Single    Spouse name: Not on file   Number of children: 3   Years of education: Not on file   Highest education level: GED or equivalent  Occupational History   Not on file  Tobacco Use   Smoking status: Former    Current packs/day: 0.00    Average packs/day: 0.3 packs/day for 38.0 years (9.5 ttl pk-yrs)    Types: Cigarettes    Start date: 08/10/1980    Quit date: 08/10/2018    Years since quitting: 5.5    Passive exposure: Past   Smokeless tobacco: Never   Tobacco comments:    Quit 08-10-2018  Vaping Use   Vaping status: Never Used  Substance and Sexual Activity   Alcohol use: Not Currently    Comment: occassional,none last 24hrs   Drug use: Not Currently    Types: Marijuana    Comment: Smoke caused severe chest pain stopped immediately   Sexual activity: Yes    Birth control/protection: Post-menopausal  Other Topics Concern   Not on file  Social History Narrative   Lives with youngest daughter Twana Gal. 2 Sons lives in [place]. 2 indoor dogs.   Social Drivers of Corporate investment banker Strain: Low Risk  (08/23/2023)   Overall Financial Resource Strain (CARDIA)    Difficulty of Paying Living Expenses: Not hard at all  Food Insecurity: No Food Insecurity (08/23/2023)   Hunger Vital Sign    Worried About Running Out of Food in the Last Year: Never true    Ran Out of Food in the  Last Year: Never true  Transportation Needs: No Transportation Needs (08/23/2023)   PRAPARE - Administrator, Civil Service (Medical): No    Lack of Transportation (Non-Medical): No  Physical Activity: Insufficiently Active (08/23/2023)   Exercise Vital Sign    Days of Exercise per Week: 2  days    Minutes of Exercise per Session: 10 min  Stress: Stress Concern Present (08/23/2023)   [employer] of Occupational Health - Occupational Stress Questionnaire    Feeling of Stress : Very much  Social Connections: Socially Isolated (08/23/2023)   Social Connection and Isolation Panel [NHANES]    Frequency of Communication with Friends and Family: More than three times a week    Frequency of Social Gatherings with Friends and Family: Never    Attends Religious Services: Never    Database administrator or Organizations: No    Attends Engineer, structural: Not on file    Marital Status: Separated     Family History:  The patient's family history includes Asthma (age of onset: 37) in her mother; Breast cancer in her mother; Breast cancer (age of onset: 8) in her maternal grandmother; Diabetes in her maternal grandmother and mother; Heart attack in her mother; Heart disease in her maternal grandmother and mother; Hyperlipidemia in her maternal grandmother and mother; Hypertension in her mother; Sarcoidosis in her mother.  ROS:   12-point review of systems is negative unless otherwise noted in the HPI.   EKGs/Labs/Other Studies Reviewed:    Studies reviewed were summarized above. The additional studies were reviewed today:  2D echo 09/16/2023: 1. Left ventricular ejection fraction, by estimation, is 60 to 65%. The  left ventricle has normal function. The left ventricle has no regional  wall motion abnormalities. Left ventricular diastolic parameters are  consistent with Grade I diastolic  dysfunction (impaired relaxation). The average left ventricular global   longitudinal strain is -17.2 %.   2. Right ventricular systolic function is normal. The right ventricular  size is normal. Tricuspid regurgitation signal is inadequate for assessing  PA pressure.   3. The mitral valve is normal in structure. Mild mitral valve  regurgitation. No evidence of mitral stenosis.   4. Tricuspid valve regurgitation is mild to moderate.   5. The aortic valve is normal in structure. Aortic valve regurgitation is  not visualized. No aortic stenosis is present.   6. The inferior vena cava is normal in size with greater than 50%  respiratory variability, suggesting right atrial pressure of 3 mmHg.  ___________  LHC 07/24/2023:   Ost 1st Mrg to 1st Mrg lesion is 60% stenosed.   Ost LAD to Prox LAD lesion is 40% stenosed.   Prox Cx to Mid Cx lesion is 50% stenosed.   Prox RCA to Mid RCA lesion is 20% stenosed.   Dist RCA lesion is 10% stenosed.   Mid RCA lesion is 30% stenosed.   RPAV lesion is 80% stenosed.   The left ventricular systolic function is normal.   LV end diastolic pressure is mildly elevated.   The left ventricular ejection fraction is 55-65% by visual estimate.   1.  Widely patent RCA stents with mild in-stent restenosis.  Stable mild to moderate LAD and left circumflex disease.  There is new 80% stenosis in the right posterior AV groove artery but vessel diameter is at most 2 mm in that area. 2.  Normal LV systolic function and mildly elevated left ventricular end-diastolic pressure.   Recommendations: Recommend continued aggressive medical therapy. The right posterior AV groove artery is too small to stent. ___________   Renal artery ultrasound 08/10/2019: Summary:  Largest Aortic Diameter: 2.1 cm    Renal:    Right: Normal size right kidney. No evidence of right renal artery  stenosis. Normal right Resisitive Index. Normal cortical         thickness of right kidney. RRV flow present.  Left:  Normal size of left kidney. No evidence  of left renal artery         stenosis. Normal left Resistive Index. Abnormal cortical         thickness of the left kidney. LRV flow present.  Mesenteric:  Normal Celiac artery and Superior Mesenteric artery findings.  __________   2D echo 02/23/2019: 1. The left ventricle has low normal systolic function, with an ejection  fraction of 50-55%. The cavity size was normal. There is mildly increased  left ventricular wall thickness. Unable to exclude mild hypokinesis of the  inferior wall. Left ventricular   diastolic Doppler parameters are consistent with impaired relaxation.   2. The right ventricle has normal systolic function. The cavity was  normal. There is no increase in right ventricular wall thickness. Unable  to estimate RVSP.  __________   [facility] 01/13/2019: Coronary dominance: Right dominant  Left mainstem: Large vessel that bifurcates into the LAD and left circumflex, no significant disease noted  Left anterior descending (LAD): Large vessel that extends to the apical region, diagonal branch 2 of moderate size, mild proximal LAD disease estimated at 30%  Left circumflex (LCx): Large vessel with OM branch 2, mild mid left circumflex disease estimated at 40%, high OM branches small bifurcating diffusely diseased 60% proximal disease  Right coronary artery (RCA): Right dominant vessel with PL and PDA, moderate to large sized vessel 75% proximal napkin ring lesion, with 80% distal stenosis circumferential  Left ventriculography: Left ventricular systolic function is normal, LVEF is estimated at 55-65%, there is no significant mitral regurgitation , no significant aortic valve stenosis  Final Conclusions:  Single-vessel disease notably of the RCA moderate to severe proximal disease estimated 75% and distal RCA disease estimated at 80% There is disease of high OM vessel, small in caliber diffusely diseased Mild proximal LAD disease  Recommendations:  Case discussed with  interventional cardiology, Dr. END Given functional study/cardiac CTA showing stenosis in RCA by FFR and given her symptoms concerning for unstable angina, will consider stenting of proximal RCA and distal RCA Medical management for disease on the left system    PCI 01/13/2019: Conclusions: Severe single vessel coronary artery disease with diffuse disease involving the RCA with focal lesions of up to 80% involving the proximal and distal vessel (see diagnostic angiography report by Dr. Gollan for details). Successful PCI to the proximal RCA using a Resolute Onyx 2.75 x 22 mm drug-eluting stent with 0% residual stenosis and TIMI-3 flow. Successful PCI to the distal RCA using a Resolute Onyx 2.75 x 18 mm drug-eluting stent with 0% residual stenosis and TIMI-3 flow.   Recommendations: Overnight extended recovery. Dual antiplatelet therapy with aspirin  and clopidogrel  for at least 12 months, ideally longer given multivessel coronary artery and peripheral vascular disease. Aggressive secondary prevention. __________   Coronary CTA 01/04/2019: FINDINGS: Coronary calcium  score: The patient's coronary artery calcium  score is 51, which places the patient in the 94 percentile.   Coronary arteries: Normal coronary origins.  Right dominance.   Right Coronary Artery: Proximal RCA atherosclerotic plaque with moderate stenosis (50-69%). Possible severe stenosis in the mid RCA, 70-99 % stenosis, atherosclerotic plaque. This area is poorly visualized due to motion and image resolution.   Left Main Coronary Artery: No detectable plaque or stenosis.   Left Anterior Descending Coronary Artery: Tubular plaque progresses from mild to  severe stenosis in proximal LAD. At narrowest point, severe mixed atherosclerotic plaque with positive remodeling, 70-99% stenosis. Mild diffuse plaque in mid and distal LAD.   Left Circumflex Artery: Severe stenosis in the proximal L circumflex with atherosclerotic plaque,  70-99% stenosis.   Aorta: Normal size, 34 mm at the mid ascending aorta (level of the PA bifurcation) measured double oblique. No calcifications. No dissection.   Aortic Valve: No calcifications.   Other findings:   Normal pulmonary vein drainage into the left atrium.   Normal left atrial appendage without a thrombus.   Normal size of the pulmonary artery.   IMPRESSION: 1. Severe CAD, CADRADS = 4V. Severe atherosclerotic plaque with 70-99% stenosis in the proximal LAD, proximal L circumflex and mid RCA. Positive remodeling with spotty calcifications in the proximal LAD, suggesting vulnerable plaque characteristics. 2. The patient's coronary artery calcium  score is 51, which places the patient in the 94 percentile. 3. Normal coronary origin with right dominance.   ctFFR: 1. Left Main:  No significant stenosis. FFR = 0.98 2. LAD: Proximal FFR = 0.77, Mid FFR = 0.75, Distal FFR = 0.73 3. LCX: Proximal FFR = 0.79, Distal FFR = 0.68 4. RCA: Proximal FFR = 0.95, Mid FFR = 0.67, Distal FFR = 0.52   IMPRESSION: 1. CT FFR analysis showed hemodynamically significant stenosis in the mid RCA. Indeterminate FFR in the proximal LAD and proximal L circumflex lesions. Consider coronary angiography. ___________   2D echo 02/06/2016: - Left ventricle: The cavity size was normal. Systolic function was    normal. The estimated ejection fraction was in the range of 55%    to 60%. Wall motion was normal; there were no regional wall    motion abnormalities. Left ventricular diastolic function    parameters were normal.  - Mitral valve: There was mild regurgitation.  - Right ventricle: Systolic function was normal.  - Pulmonary arteries: Systolic pressure was within the normal    range.  - Pericardium, extracardiac: A trivial pericardial effusion was    identified.  __________   24-hour Holter 01/2016: Overall rhythm was sinus rhythm, with average heart rate of 88 BPM. Minimal heart rate of  57 bpm at 5:24 AM 02/06/2016. Maximum heart rate of 136 BPM and 1747 02/05/2016. 10% total number of beats in tachycardia. Longest RR interval was 1.46 seconds.   No high grade supraventricular ectopy: 4 isolated PACs.    Ventricular ectopy: 14,946 isolated PVCs. 53 ventricular couplets. No ventricular runs. 12% of total number of beats were ventricular beats.   No documented atrial fibrillation. __________   Treadmill MPI 01/23/2016: Blood pressure demonstrated a hypertensive response to exercise. There was no ST segment deviation noted during stress. No T wave inversion was noted during stress.   Exercise myocardial perfusion imaging study with no significant  ischemia Normal wall motion, EF estimated at 46% Depressed EF likely secondary to GI uptake artifact  No EKG changes concerning for ischemia at peak stress or in recovery. Rare PVCs noted Good exercise tolerance, 10.1 METS achieved Low risk scan    EKG:  EKG is not ordered today.    Recent Labs: 09/14/2023: ALT 21; BUN 10; Creatinine, Ser 0.72; Hemoglobin 12.3; Platelets 522; Potassium 4.1; Sodium 142; TSH 1.940  Recent Lipid Panel    Component Value Date/Time   CHOL 321 (H) 07/20/2023 1525   TRIG 189 (H) 07/20/2023 1525   HDL 46 07/20/2023 1525   CHOLHDL 7.0 (H) 07/20/2023 1525   CHOLHDL 4.5 07/09/2020 1803  VLDL 34 07/09/2020 1803   LDLCALC 238 (H) 07/20/2023 1525   LDLDIRECT 185 (H) 12/06/2018 1017    PHYSICAL EXAM:    VS:  BP 136/80 (BP Location: Left Arm, Patient Position: Sitting, Cuff Size: Normal)   Pulse 72   Ht 5\' 6"  (1.676 m)   Wt 174 lb 12.8 oz (79.3 kg)   SpO2 99%   BMI 28.21 kg/m   BMI: Body mass index is 28.21 kg/m.  Physical Exam Vitals reviewed.  Constitutional:      Appearance: She is well-developed.  HENT:     Head: Normocephalic and atraumatic.  Eyes:     General:        Right eye: No discharge.        Left eye: No discharge.  Cardiovascular:     Rate and Rhythm: Normal rate  and regular rhythm.     Heart sounds: Normal heart sounds, S1 normal and S2 normal. Heart sounds not distant. No midsystolic click and no opening snap. No murmur heard.    No friction rub.  Pulmonary:     Effort: Pulmonary effort is normal. No respiratory distress.     Breath sounds: Normal breath sounds. No decreased breath sounds, wheezing, rhonchi or rales.  Chest:     Chest wall: No tenderness.  Musculoskeletal:     Cervical back: Normal range of motion.     Right lower leg: No edema.     Left lower leg: No edema.  Skin:    General: Skin is warm and dry.     Nails: There is no clubbing.  Neurological:     Mental Status: She is alert and oriented to person, place, and time.  Psychiatric:        Speech: Speech normal.        Behavior: Behavior normal.        Thought Content: Thought content normal.        Judgment: Judgment normal.     Wt Readings from Last 3 Encounters:  02/08/24 174 lb 12.8 oz (79.3 kg)  09/21/23 169 lb 12.8 oz (77 kg)  08/26/23 159 lb 3.2 oz (72.2 kg)     ASSESSMENT & PLAN:   CAD involving the native coronary arteries with stable angina: She is doing well and without current symptoms of angina or cardiac decompensation.  LHC in 07/2023 showed patent RCA stents with mild in-stent restenosis and otherwise nonobstructive disease involving the left coronary tree with medical therapy recommended. Continue aggressive risk factor modification and secondary prevention including aspirin  81 mg, clopidogrel  75 mg, amlodipine  10 mg, Repatha , ezetimibe  10 mg, rosuvastatin  40 mg, and metoprolol  succinate 50 mg.  No indication for further ischemic testing at this time.  HTN: Blood pressure is reasonably controlled in the office today.  Continue current pharmacotherapy including amlodipine  10 mg, losartan  100 mg, Toprol -XL 50 mg, and as needed clonidine .  Low-sodium diet encouraged.  HLD: Total cholesterol 321 with LDL 238 in 06/2023 with normal AST/ALT in 02/2022.  Target  LDL less than 55 in the context of underlying CAD and PAD.  Concern for familial hyperlipidemia.  She will have blood work through PCPs office later this week.  She will contact us  once this is complete for review.  For now, she remains on Repatha  140 mg every 14 days, rosuvastatin  40 mg, and ezetimibe  10 mg.  AAA: Status post endovascular repair with ongoing management per vascular surgery.  PAD: No symptoms of lifestyle limiting claudication or nonhealing wounds.  Followed  by vascular surgery.  Remains on aspirin  and statin.   Disposition: F/u with Dr. Gollan or an APP in 6 months.   Medication Adjustments/Labs and Tests Ordered: Current medicines are reviewed at length with the patient today.  Concerns regarding medicines are outlined above. Medication changes, Labs and Tests ordered today are summarized above and listed in the Patient Instructions accessible in Encounters.   Signed, Varney Gentleman, PA-C 02/08/2024 4:25 PM     [facility] [place] [address] [place], [place] [address] [phone]

## 2024-03-08 ENCOUNTER — Encounter (INDEPENDENT_AMBULATORY_CARE_PROVIDER_SITE_OTHER): Payer: Self-pay

## 2024-03-15 ENCOUNTER — Other Ambulatory Visit (HOSPITAL_BASED_OUTPATIENT_CLINIC_OR_DEPARTMENT_OTHER): Payer: Self-pay | Admitting: *Deleted

## 2024-03-15 ENCOUNTER — Other Ambulatory Visit (HOSPITAL_COMMUNITY): Payer: Self-pay | Admitting: Family Medicine

## 2024-03-15 ENCOUNTER — Other Ambulatory Visit (HOSPITAL_BASED_OUTPATIENT_CLINIC_OR_DEPARTMENT_OTHER): Payer: Medicaid Other

## 2024-03-15 DIAGNOSIS — Z0000 Encounter for general adult medical examination without abnormal findings: Secondary | ICD-10-CM

## 2024-03-16 LAB — LIPID PANEL
Chol/HDL Ratio: 5.6 ratio — ABNORMAL HIGH (ref 0.0–4.4)
Cholesterol, Total: 274 mg/dL — ABNORMAL HIGH (ref 100–199)
HDL: 49 mg/dL (ref >39)
LDL Chol Calc (NIH): 197 mg/dL — ABNORMAL HIGH (ref 0–99)
Triglycerides: 152 mg/dL — ABNORMAL HIGH (ref 0–149)
VLDL Cholesterol Cal: 28 mg/dL (ref 5–40)

## 2024-03-16 LAB — COMPREHENSIVE METABOLIC PANEL WITH GFR
ALT: 20 IU/L (ref 0–32)
AST: 20 IU/L (ref 0–40)
Albumin: 4.7 g/dL (ref 3.8–4.9)
Alkaline Phosphatase: 115 IU/L (ref 44–121)
BUN/Creatinine Ratio: 13 (ref 9–23)
BUN: 13 mg/dL (ref 6–24)
Bilirubin Total: 0.4 mg/dL (ref 0.0–1.2)
CO2: 22 mmol/L (ref 20–29)
Calcium: 9.7 mg/dL (ref 8.7–10.2)
Chloride: 103 mmol/L (ref 96–106)
Creatinine, Ser: 0.97 mg/dL (ref 0.57–1.00)
Globulin, Total: 2.7 g/dL (ref 1.5–4.5)
Glucose: 100 mg/dL — ABNORMAL HIGH (ref 70–99)
Potassium: 4.2 mmol/L (ref 3.5–5.2)
Sodium: 141 mmol/L (ref 134–144)
Total Protein: 7.4 g/dL (ref 6.0–8.5)
eGFR: 67 mL/min/{1.73_m2} (ref >59)

## 2024-03-16 LAB — CBC WITH DIFFERENTIAL/PLATELET
Basophils Absolute: 0.1 10*3/uL (ref 0.0–0.2)
Basos: 1 %
EOS (ABSOLUTE): 0.3 10*3/uL (ref 0.0–0.4)
Eos: 3 %
Hematocrit: 41 % (ref 34.0–46.6)
Hemoglobin: 12.9 g/dL (ref 11.1–15.9)
Immature Grans (Abs): 0 10*3/uL (ref 0.0–0.1)
Immature Granulocytes: 0 %
Lymphocytes Absolute: 3.6 10*3/uL — ABNORMAL HIGH (ref 0.7–3.1)
Lymphs: 30 %
MCH: 27.4 pg (ref 26.6–33.0)
MCHC: 31.5 g/dL (ref 31.5–35.7)
MCV: 87 fL (ref 79–97)
Monocytes Absolute: 0.8 10*3/uL (ref 0.1–0.9)
Monocytes: 7 %
Neutrophils Absolute: 7.1 10*3/uL — ABNORMAL HIGH (ref 1.4–7.0)
Neutrophils: 59 %
Platelets: 520 10*3/uL — ABNORMAL HIGH (ref 150–450)
RBC: 4.7 x10E6/uL (ref 3.77–5.28)
RDW: 16.3 % — ABNORMAL HIGH (ref 11.7–15.4)
WBC: 11.9 10*3/uL — ABNORMAL HIGH (ref 3.4–10.8)

## 2024-03-16 LAB — HEMOGLOBIN A1C
Est. average glucose Bld gHb Est-mCnc: 134 mg/dL
Hgb A1c MFr Bld: 6.3 % — ABNORMAL HIGH (ref 4.8–5.6)

## 2024-03-16 LAB — TSH: TSH: 2.19 u[IU]/mL (ref 0.450–4.500)

## 2024-03-18 ENCOUNTER — Emergency Department: Admission: EM | Admit: 2024-03-18 | Discharge: 2024-03-18 | Disposition: A | Discharge status: Home / Self Care

## 2024-03-18 ENCOUNTER — Emergency Department

## 2024-03-18 ENCOUNTER — Other Ambulatory Visit: Payer: Self-pay

## 2024-03-18 DIAGNOSIS — I10 Essential (primary) hypertension: Secondary | ICD-10-CM | POA: Diagnosis not present

## 2024-03-18 DIAGNOSIS — I2510 Atherosclerotic heart disease of native coronary artery without angina pectoris: Secondary | ICD-10-CM | POA: Diagnosis not present

## 2024-03-18 DIAGNOSIS — H538 Other visual disturbances: Secondary | ICD-10-CM | POA: Diagnosis not present

## 2024-03-18 DIAGNOSIS — R519 Headache, unspecified: Secondary | ICD-10-CM | POA: Diagnosis not present

## 2024-03-18 DIAGNOSIS — E119 Type 2 diabetes mellitus without complications: Secondary | ICD-10-CM | POA: Insufficient documentation

## 2024-03-18 DIAGNOSIS — R6884 Jaw pain: Secondary | ICD-10-CM | POA: Insufficient documentation

## 2024-03-18 MED ORDER — LOSARTAN POTASSIUM 50 MG PO TABS
100.0000 mg | ORAL_TABLET | Freq: Once | ORAL | Status: AC
  Administered 2024-03-18: 100 mg via ORAL
  Filled 2024-03-18: qty 2

## 2024-03-18 MED ORDER — AMLODIPINE BESYLATE 5 MG PO TABS
10.0000 mg | ORAL_TABLET | Freq: Once | ORAL | Status: AC
  Administered 2024-03-18: 10 mg via ORAL
  Filled 2024-03-18: qty 2

## 2024-03-18 MED ORDER — METOPROLOL SUCCINATE ER 50 MG PO TB24
50.0000 mg | ORAL_TABLET | Freq: Once | ORAL | Status: AC
  Administered 2024-03-18: 50 mg via ORAL
  Filled 2024-03-18: qty 1

## 2024-03-18 NOTE — ED Notes (Signed)
 Pt ambulated to bathroom down the hallway with steady gait. This tech and PD officer accompanied patient. Pt is pleasant and cooperative.

## 2024-03-18 NOTE — Discharge Instructions (Signed)
 Your evaluation in the emergency department was reassuring, and CTs showed no fractures of your face and no no bleeding in your brain.  You can use Tylenol  as needed for any ongoing discomfort.  With regard to your blood pressure, do continue to take your home blood pressure medications as already prescribed.
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
## 2024-03-18 NOTE — ED Notes (Addendum)
 RN stated "The emergency MD will be over soon to assess you to determine if you need blood work or scans". The patient responded "I aint giving you any blood I just got my blood drawn and I won't be giving it".   [employer] standing in hallway with patient.
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
## 2024-03-18 NOTE — ED Notes (Signed)
 Pt verbalizes understanding of discharge instructions. Pt discharged under poloce custody, Pt left ER ambulatory accompanied by [employer] officer

## 2024-03-18 NOTE — ED Notes (Addendum)
 Pt ambulatory to restroom accompanied by [employer] officer

## 2024-03-21 ENCOUNTER — Ambulatory Visit (HOSPITAL_BASED_OUTPATIENT_CLINIC_OR_DEPARTMENT_OTHER): Payer: Medicaid Other | Admitting: Family Medicine

## 2024-04-04 ENCOUNTER — Other Ambulatory Visit (HOSPITAL_BASED_OUTPATIENT_CLINIC_OR_DEPARTMENT_OTHER): Payer: Self-pay | Admitting: Family Medicine

## 2024-05-06 ENCOUNTER — Other Ambulatory Visit (HOSPITAL_BASED_OUTPATIENT_CLINIC_OR_DEPARTMENT_OTHER): Payer: Self-pay | Admitting: Family Medicine

## 2024-05-16 ENCOUNTER — Telehealth: Payer: Self-pay | Admitting: Emergency Medicine

## 2024-05-16 ENCOUNTER — Ambulatory Visit (INDEPENDENT_AMBULATORY_CARE_PROVIDER_SITE_OTHER): Admitting: Family Medicine

## 2024-05-16 ENCOUNTER — Encounter (HOSPITAL_BASED_OUTPATIENT_CLINIC_OR_DEPARTMENT_OTHER): Payer: Self-pay | Admitting: Family Medicine

## 2024-05-16 ENCOUNTER — Other Ambulatory Visit (HOSPITAL_COMMUNITY)
Admission: RE | Admit: 2024-05-16 | Discharge: 2024-05-16 | Disposition: A | Discharge status: Home / Self Care | Source: Ambulatory Visit | Attending: Family Medicine | Admitting: Family Medicine

## 2024-05-16 VITALS — BP 189/104 | HR 63 | Ht 66.0 in | Wt 169.1 lb

## 2024-05-16 DIAGNOSIS — E782 Mixed hyperlipidemia: Secondary | ICD-10-CM

## 2024-05-16 DIAGNOSIS — I10 Essential (primary) hypertension: Secondary | ICD-10-CM

## 2024-05-16 DIAGNOSIS — N898 Other specified noninflammatory disorders of vagina: Secondary | ICD-10-CM | POA: Insufficient documentation

## 2024-05-16 DIAGNOSIS — Z124 Encounter for screening for malignant neoplasm of cervix: Secondary | ICD-10-CM | POA: Diagnosis present

## 2024-05-16 DIAGNOSIS — E119 Type 2 diabetes mellitus without complications: Secondary | ICD-10-CM

## 2024-05-16 DIAGNOSIS — Z1231 Encounter for screening mammogram for malignant neoplasm of breast: Secondary | ICD-10-CM

## 2024-05-16 MED ORDER — BLOOD PRESSURE CUFF MISC: 0 refills | Status: AC

## 2024-05-16 NOTE — Progress Notes (Signed)
 Subjective:   Heather Mahoney 03-05-1966 05/16/2024  Chief Complaint  Patient presents with   Follow-up    6 month follow up patient had labs would like for you to look at spot on breast    HPI: Vollie M Napoleon presents today for re-assessment and management of chronic medical conditions.  DIABETES MELLITUS: Alazne M Bloch presents for the medical management of diabetes.  Current diabetes medication regimen: Farxiga  10mg  (She had stopped her Farxiga  for 1.5 months and made dietary changes and has restarted after seeing A1C improvement)  Patient is  adhering to a diabetic diet. She is eating lean meats, cut out sugar.  Patient is  exercising regularly.  Patient is  checking BS regularly.  Patient is  checking their feet regularly.  Denies polydipsia, polyphagia, polyuria, open wounds or ulcers on feet.  Lab Results  Component Value Date   HGBA1C 6.3 (H) 03/15/2024    Foot Exam: 09/21/2023 Lab Results  Component Value Date   LABMICR 13.2 09/14/2023    Wt Readings from Last 3 Encounters:  05/16/24 169 lb 1.6 oz (76.7 kg)  03/18/24 176 lb (79.8 kg)  02/08/24 174 lb 12.8 oz (79.3 kg)    HYPERLIPIDEMIA: Chieko M Boyajian presents for the medical management of hyperlipidemia.  Patient's current HLD regimen is: Repatha  140mg  Q14 days, Zetia  10mg  daily, and Rosuvastatin  40mg  daily  Patient is  currently taking prescribed medications for HLD.  She is not meeting her target LDL of less than 55 per Cardiology recommendations due to CAD and PAD history. There is concern for possible familial hyperlipidemia. Will forward to her Cardiology team for review.  Adhering to heathy diet: Yes, has made dietary changes with lean proteins.  Exercising regularly: yes Denies myalgias.  Lab Results  Component Value Date   CHOL 274 (H) 03/15/2024   HDL 49 03/15/2024   LDLCALC 197 (H) 03/15/2024   LDLDIRECT 185 (H) 12/06/2018   TRIG 152 (H) 03/15/2024   CHOLHDL 5.6 (H) 03/15/2024   The  ASCVD Risk score (Arnett DK, et al., 2019) failed to calculate for the following reasons:   Risk score cannot be calculated because patient has a medical history suggesting prior/existing ASCVD   BREAST CONCERN:  Patient states she has noticed some palpable white patches present to left breast intermittently for the past several months. Reports mild tenderness with palpation. Denies pain, drainage, or swelling. Last Mammogram was  2023. She is requesting to complete pap smear today and has vaginal discharge she would like checked.     The following portions of the patient's history were reviewed and updated as appropriate: past medical history, past surgical history, family history, social history, allergies, medications, and problem list.   Patient Active Problem List   Diagnosis Date Noted   Type 2 diabetes mellitus without complication, without long-term current use of insulin (HCC) 09/21/2023   Reactive thrombocytosis 09/21/2023   Anxiety and depression 08/24/2023   Encounter for screening colonoscopy    Adenomatous polyp of colon    History of endovascular stent graft for abdominal aortic aneurysm 04/26/2020   Urticaria 04/26/2020   Precordial chest pain    Atherosclerosis of native arteries of extremity with intermittent claudication (HCC) 04/01/2019   Coronary artery disease of native artery of native heart with stable angina pectoris (HCC) 01/26/2019   CAD (coronary artery disease) 01/14/2019   Unstable angina (HCC) 01/13/2019   PAD (peripheral artery disease) (HCC) 08/30/2018   AAA (abdominal aortic aneurysm) (HCC) 08/10/2018  Chest pain with moderate risk for cardiac etiology 12/18/2017   Scotoma 12/18/2017   PVC (premature ventricular contraction) 02/12/2016   Essential hypertension, benign 02/12/2016   Tobacco use disorder 02/12/2016   Migraine headache 01/09/2016   Lymphocytosis 12/21/2015   Hypertension 12/12/2015   Mixed hyperlipidemia 12/12/2015   Vitamin D   deficiency 12/12/2015   Mitral regurgitation 12/12/2015   Hypertensive urgency 11/29/2015   Abdominal pulsatile mass 11/29/2015   Past Medical History:  Diagnosis Date   Anxiety    Arthritis    CAD (coronary artery disease)    a. PCI to proximal and distal RCA 3/26   Cancer [facility])    Mom grandma   Chest pain 04/09/2019   Chronic kidney disease    Mom   Depression 2023   Diabetes mellitus without complication (HCC)    GERD (gastroesophageal reflux disease)    Heart murmur    MR   Hiatal hernia    Hot flashes    Hyperlipidemia    Hypertension    Myocardial infarction (HCC)    Neuropathy    Thyroid  disease    Mom   Tobacco abuse    a. Started at age 37, quit during 3 pregnancies. b. 1 PPD for a 40 pack-year hx (2019)    Past Surgical History:  Procedure Laterality Date   ABDOMINAL AORTIC ANEURYSM REPAIR     2019   BREAST BIOPSY Right    Multiple biopsies-all benign   BREAST SURGERY  1986   I&D for 'milk duct'   COLONOSCOPY WITH PROPOFOL  N/A 07/07/2022   Procedure: COLONOSCOPY WITH PROPOFOL ;  Surgeon: Therisa Bi, MD;  Location: [facility] ENDOSCOPY;  Service: Gastroenterology;  Laterality: N/A;   CORONARY STENT INTERVENTION N/A 01/13/2019   Procedure: CORONARY STENT INTERVENTION;  Surgeon: Mady Bruckner, MD;  Location: [facility] INVASIVE CV LAB;  Service: Cardiovascular;  Laterality: N/A;   EMBOLECTOMY  08/13/2018   Procedure: Right SFA and profunda femoris Fogarty embolectomy 3  Fogarty embolectomy balloon;  Surgeon: Marea Selinda RAMAN, MD;  Location: [facility] ORS;  Service: Vascular;;   ENDARTERECTOMY FEMORAL Right 08/13/2018   Procedure: Right common femoral, profunda femoris, and superficial femoral artery endarterectomies and patch angioplasty ;  Surgeon: Marea Selinda RAMAN, MD;  Location: [facility] ORS;  Service: Vascular;  Laterality: Right;   ENDOVASCULAR REPAIR/STENT GRAFT N/A 08/13/2018   Procedure: ENDOVASCULAR REPAIR/STENT GRAFT;  Surgeon: Jama Cordella MATSU, MD;  Location: [facility] INVASIVE  CV LAB;  Service: Cardiovascular;  Laterality: N/A;   LEFT HEART CATH AND CORONARY ANGIOGRAPHY Left 01/13/2019   Procedure: LEFT HEART CATH AND CORONARY ANGIOGRAPHY;  Surgeon: Perla Evalene PARAS, MD;  Location: [facility] INVASIVE CV LAB;  Service: Cardiovascular;  Laterality: Left;   LEFT HEART CATH AND CORONARY ANGIOGRAPHY Left 07/24/2023   Procedure: LEFT HEART CATH AND CORONARY ANGIOGRAPHY;  Surgeon: Darron Deatrice LABOR, MD;  Location: [facility] INVASIVE CV LAB;  Service: Cardiovascular;  Laterality: Left;   LOWER EXTREMITY ANGIOGRAPHY Right 09/08/2022   Procedure: Lower Extremity Angiography;  Surgeon: Marea Selinda RAMAN, MD;  Location: [facility] INVASIVE CV LAB;  Service: Cardiovascular;  Laterality: Right;   LOWER EXTREMITY ANGIOGRAPHY Right 09/15/2022   Procedure: Lower Extremity Angiography;  Surgeon: Marea Selinda RAMAN, MD;  Location: [facility] INVASIVE CV LAB;  Service: Cardiovascular;  Laterality: Right;   SALPINGOOPHORECTOMY Left 2000   Family History  Problem Relation Age of Onset   Breast cancer Mother    Diabetes Mother    Hypertension Mother    Hyperlipidemia Mother    Heart disease Mother  CABG X 4   Asthma Mother 45   Sarcoidosis Mother        In remission   Heart attack Mother    Breast cancer Maternal Grandmother 44   Diabetes Maternal Grandmother    Heart disease Maternal Grandmother    Hyperlipidemia Maternal Grandmother    Outpatient Medications Prior to Visit  Medication Sig Dispense Refill   amLODipine  (NORVASC ) 10 MG tablet Take 1 tablet (10 mg total) by mouth daily. 30 tablet 3   aspirin  81 MG EC tablet Take 1 tablet (81 mg total) by mouth daily. 90 tablet 3   cloNIDine  (CATAPRES ) 0.2 MG tablet Take 1 tablet (0.2 mg total) by mouth in the morning and at bedtime. Take an extra 0.1 mg for BP >160 90 tablet 3   clopidogrel  (PLAVIX ) 75 MG tablet Take 1 tablet (75 mg total) by mouth daily. 30 tablet 3   Evolocumab  (REPATHA  SURECLICK) 140 MG/ML SOAJ Inject 140 mg into the skin every 14  (fourteen) days. 6 mL 3   ezetimibe  (ZETIA ) 10 MG tablet Take 1 tablet (10 mg total) by mouth daily. 30 tablet 3   FARXIGA  10 MG TABS tablet Take 1 tablet by mouth once daily 30 tablet 5   gabapentin  (NEURONTIN ) 600 MG tablet Take 1 tablet (600 mg total) by mouth 3 (three) times daily as needed. 90 tablet 5   losartan  (COZAAR ) 100 MG tablet Take 1 tablet (100 mg total) by mouth daily. 30 tablet 3   metoprolol  succinate (TOPROL -XL) 50 MG 24 hr tablet Take 1 tablet (50 mg total) by mouth daily. Take with or immediately following a meal. 30 tablet 3   pantoprazole  (PROTONIX ) 40 MG tablet Take 1 tablet (40 mg total) by mouth daily. 90 tablet 3   rosuvastatin  (CRESTOR ) 40 MG tablet Take 1 tablet (40 mg total) by mouth daily. 30 tablet 3   venlafaxine  (EFFEXOR ) 75 MG tablet Take 1 tablet (75 mg total) by mouth 2 (two) times daily with a meal. 60 tablet 5   No facility-administered medications prior to visit.   Allergies  Allergen Reactions   Strawberry Extract Hives and Swelling   Strawberry Flavoring Agent (Non-Screening) Rash     ROS: A complete ROS was performed with pertinent positives/negatives noted in the HPI. The remainder of the ROS are negative.    Objective:   Today's Vitals   05/16/24 1050 05/16/24 1316  BP: (!) 187/105 (!) 189/104  Pulse: 63   SpO2: 100%   Weight: 169 lb 1.6 oz (76.7 kg)   Height: 5' 6 (1.676 m)   PainSc: 0-No pain     Physical Exam          GENERAL: Well-appearing, in NAD. Well nourished.  SKIN: Pink, warm and dry.  Head: Normocephalic. NECK: Trachea midline. Full ROM w/o pain or tenderness.  BREASTS: Breasts pendulous, symmetrical, and w/o palpable masses. Nipples everted and w/o discharge. No rash or skin retraction. No axillary or supraclavicular lymphadenopathy.  RESPIRATORY: Chest wall symmetrical. Respirations even and non-labored. Breath sounds clear to auscultation bilaterally.  CARDIAC: S1, S2 present, regular rate and rhythm without  murmur or gallops. Peripheral pulses 2+ bilaterally.  MSK: Muscle tone and strength appropriate for age. Joints w/o tenderness, redness, or swelling.  GU: External genitalia without erythema, lesions, or masses. No lymphadenopathy. Vaginal mucosa pink and moist with white thick discharge, no lesions, or ulcerations. Cervix pink without discharge. Cervical os closed. Uterus and adnexae palpable, not enlarged, and w/o tenderness. No palpable  masses.  EXTREMITIES: Without clubbing, cyanosis, or edema.  NEUROLOGIC: No motor or sensory deficits. Steady, even gait. C2-C12 intact.  PSYCH/MENTAL STATUS: Alert, oriented x 3. Cooperative, appropriate mood and affect.   Chaperoned by Kerri Fuel, CMA   Health Maintenance Due  Topic Date Due   MAMMOGRAM  05/31/2023   OPHTHALMOLOGY EXAM  02/09/2024      Assessment & Plan:  1. Type 2 diabetes mellitus without complication, without long-term current use of insulin (HCC) (Primary) Controlled. Continue Farxiga  as directed and dietary changes with exercise. Will return in 3 months for A1C and evaluation. Discussed importance of taking medication as directed. Ophthalomogy list provided.  - Comprehensive metabolic panel with GFR; Future - Hemoglobin A1c; Future  2. Breast cancer screening by mammogram No concerns of exam or palpable or visible lesions. Obtain screening mammogram.  - MM 3D SCREENING MAMMOGRAM BILATERAL BREAST; Future  3. Mixed hyperlipidemia Uncontrolled. Will forward results to Cardiology for review. Continue on current regimen and repeat labs in 3 months. Discussed heart healthy diet to help.  - Lipid panel; Future  4. Encounter for Papanicolaou smear for cervical cancer screening - Cytology - PAP  5. Vaginal discharge Swab obtained during pap. Will treat pending results.  - Cervicovaginal ancillary only  6. Primary hypertension Uncontrolled. Patient to take medications as directed. Asymptomatic at this time. Recommend following  up with Cardiology and patient verbalized understanding.   No orders of the defined types were placed in this encounter.  Lab Orders         Comprehensive metabolic panel with GFR         Lipid panel         Hemoglobin A1c     No images are attached to the encounter or orders placed in the encounter.  Return in about 3 months (around 08/16/2024) for DIABETES CHECK UP, HLD/CAD (Fasting labs prior) .    Patient to reach out to office if new, worrisome, or unresolved symptoms arise or if no improvement in patient's condition. Patient verbalized understanding and is agreeable to treatment plan. All questions answered to patient's satisfaction.    Thersia Schuyler Stark, [place]

## 2024-05-16 NOTE — Telephone Encounter (Signed)
 [facility], orders for [facility] and BP cuff, Call to Dr [facility] office and spoke to [facility] - appears that the Feb request was closed because Bernardino was treating pt's hyperlipidemia - however that referral is because the 3 lipid meds have not been effective, and pt's hx of familial lipidemia.  Chloe reports that Hilty's scheduler will call pt.

## 2024-05-16 NOTE — Telephone Encounter (Signed)
-----   Message from Bernardino Bring sent at 05/16/2024  1:30 PM EDT ----- Thanks for reaching out.  I suspect her lipid panel will still be significantly abnormal despite treatment with rosuvastatin  40 mg, ezetimibe , and Repatha .  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  I am going to make a referral to our lipid clinic to have her evaluated there.  Marcus, Please place referral to the lipid clinic for her to see Dr. Mona.  Thanks, Ryan ----- Message ----- From: Knute Thersia Bitters, FNP Sent: 05/16/2024   1:22 PM EDT To: Bernardino CHRISTELLA Bring, PA-C  Hi Ryan,  I was able to see Heather Mahoney for her appt with me today. We had drawn her lipids at the end of May and she had rescheduled her appt to today. I am planning to recheck in 3 months with labwork as she is not at LDL goal. If you would like this done prior, let me know. Thought I would send you an update of her current labs. Thanks!

## 2024-05-17 ENCOUNTER — Ambulatory Visit (HOSPITAL_BASED_OUTPATIENT_CLINIC_OR_DEPARTMENT_OTHER): Payer: Self-pay | Admitting: Family Medicine

## 2024-05-17 LAB — CERVICOVAGINAL ANCILLARY ONLY
Bacterial Vaginitis (gardnerella): POSITIVE — AB
Candida Glabrata: NEGATIVE
Candida Vaginitis: NEGATIVE
Comment: NEGATIVE
Comment: NEGATIVE
Comment: NEGATIVE

## 2024-05-17 MED ORDER — METRONIDAZOLE 500 MG PO TABS
500.0000 mg | ORAL_TABLET | Freq: Two times a day (BID) | ORAL | 0 refills | Status: AC
Start: 2024-05-17 — End: 2024-05-24

## 2024-05-17 NOTE — Progress Notes (Signed)
 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 This is not a sexually transmitted infection.  Sexual partners do not need to be treated, however abstaining from sex or using condoms may prevent recurrence of the overgrowth. Some women have a recurrence of the overgrowth even when fully treated.  Call the office if your symptoms begin again.  Do not douche.  This is associated with decreased cure rates and more bacterial overgrowths. Take the full course of the antibiotic prescribed to you even if you begin to feel better. Do not drink alcohol with the antibiotic flagyl  (metronidazole ) as this drug will cause nausea and severe vomiting if you drink while taking antibiotic.  Do not use latex condoms with clindamycin as the drug will cause the condom to fail.

## 2024-05-24 LAB — CYTOLOGY - PAP
Chlamydia: NEGATIVE
Comment: NEGATIVE
Comment: NEGATIVE
Comment: NEGATIVE
Comment: NEGATIVE
Comment: NEGATIVE
Comment: NORMAL
Diagnosis: NEGATIVE
HPV 16: NEGATIVE
HPV 18 / 45: NEGATIVE
High risk HPV: POSITIVE — AB
Neisseria Gonorrhea: NEGATIVE
Trichomonas: NEGATIVE

## 2024-05-25 NOTE — Progress Notes (Signed)
 Hi Heather Mahoney, Your Pap smear did show a positive HPV reading.  There are multiple strains of HPV that the body will naturally clear on its own.  There is no signs of any cellular changes with your Pap smear.  At this time, we would recommend repeating your Pap smear in 1 year.  We will plan to do this with your upcoming physical in July 2026.  If you have further questions please let me know

## 2024-06-08 ENCOUNTER — Ambulatory Visit

## 2024-06-24 ENCOUNTER — Other Ambulatory Visit: Payer: Self-pay | Admitting: Cardiovascular Disease

## 2024-06-29 ENCOUNTER — Ambulatory Visit
Admission: RE | Admit: 2024-06-29 | Discharge: 2024-06-29 | Disposition: A | Discharge status: Home / Self Care | Source: Ambulatory Visit | Attending: Family Medicine | Admitting: Family Medicine

## 2024-06-29 DIAGNOSIS — Z1231 Encounter for screening mammogram for malignant neoplasm of breast: Secondary | ICD-10-CM

## 2024-07-04 NOTE — Progress Notes (Signed)
 Heather Mahoney,   Your mammogram results show no evidence of breast cancer. We will plan to repeat this in 1 year for routine screening.  If you should have concerns or changes in your breasts within the next year, please let us  know.   Thersia Stark, FNP-C

## 2024-07-06 ENCOUNTER — Telehealth: Payer: Self-pay | Admitting: Emergency Medicine

## 2024-07-06 NOTE — Telephone Encounter (Signed)
 Pt called for BP cuff script in office for pt and [facility] message sent

## 2024-07-06 NOTE — Telephone Encounter (Signed)
 Patient states that she doesn't need prescription anymore. Please advise

## 2024-07-13 ENCOUNTER — Telehealth: Payer: Self-pay | Admitting: Emergency Medicine

## 2024-07-13 NOTE — Telephone Encounter (Signed)
 Letter retrieved from pt pickup box and shredded

## 2024-07-27 NOTE — Progress Notes (Unsigned)
 Cardiology Office Note    Date:  07/28/2024   ID:  Heather Mahoney, DOB Feb 28, 1966, MRN [id number]  PCP:  Heather Thersia Bitters, FNP  Cardiologist:  Evalene Lunger, MD  Electrophysiologist:  None   Chief Complaint: Follow up  History of Present Illness:   Heather Mahoney is a 58 y.o. female with history of CAD status post prior PCI to the RCA x 2 in 12/2018, AAA status post endovascular repair and right femoral endarterectomy due to ischemic right lower extremity in 07/2018 followed by vascular surgery, orthostatic syncope, HTN, HLD, tobacco use, PVCs, and GERD who presents for follow-up of CAD.   She was previously evaluated by Dr. Monette for possible syncope in 2017.  Echo at that time showed an EF of 55 to 60%, no regional wall motion abnormalities, normal LV diastolic function, mild mitral regurgitation, normal RV systolic function, normal RVSP, and trivial pericardial effusion.  Treadmill MPI at that time showed no significant ischemia with an EF of 46% felt to be decreased secondary to GI uptake artifact, rare PVCs, good exercise tolerance with the patient achieving 10.1 METs, and was overall low risk.  Outpatient cardiac monitoring showed a predominant rhythm of sinus with an average rate of 88 bpm (range 57 to 136 bpm), 10% of the beats were tachycardic, for isolated PACs, and 14,946 isolated PVCs with an overall 12% burden.  She was seen in 11/2018 with exertional dyspnea with subsequent coronary CTA showing hemodynamically significant stenosis in the mid RCA with indeterminate FFR in the proximal LAD and proximal LCx.  In this setting she underwent LHC in 12/2018 that showed ostial LAD 30% stenosis, proximal to mid LCx 40% stenosis, OM1 60% stenosis, proximal RCA 75% stenosis, mid RCA 80% stenosis with normal LVEDP and LVEF of 65%.  She underwent successful PCI/DES x 2 to the RCA.  Echo in 02/2019 showed an EF of 50 to 55%, unable to exclude hypokinesis of the inferior wall, diastolic  dysfunction, and mild mitral regurgitation.   She was seen in the ED in 03/2023 with chest pain with negative high-sensitivity troponin x 2.  CTA chest negative for PE with a small pericardial effusion and aortic atherosclerosis noted.  She was seen in the office on 07/20/2023 noting exertional chest discomfort and shortness of breath.  In this setting she underwent LHC 07/24/2023 that showed widely patent RCA stents with mild in-stent restenosis, stable mild to moderate LAD and LCx disease, and new 80% stenosis in the right posterior AV groove, however the vessel was small in diameter, at most 2 mm, and was too small to stent.  LVEF 55 to 65% by visual estimate.  LVEDP mildly elevated.   With regards to her AAA and PAD, she is followed by vascular surgery with ABI in 06/2023 normal bilaterally, abdominal ultrasound showing patent endovascular aneurysm repair without evidence of endoleak, and right LEA stent patent without evidence of significant stenosis.   She was seen in the office in 08/2023 and reported ongoing exertional fatigue and shortness of breath without frank chest pain.  She reported some dietary indiscretion that had improved.  She was initiated on Repatha  with continuation of rosuvastatin  and ezetimibe .  Echo in 08/2023 showed an EF of 60 to 65%, no regional wall motion abnormalities, grade 1 diastolic dysfunction, normal RV systolic function and ventricular cavity size, mild mitral regurgitation, mild to moderate tricuspid regurgitation, and an estimated right atrial pressure of 3 mmHg.  She was last seen in the office in  01/2024 and was doing well from a cardiac perspective with no changes pursued at that time.  She comes in doing well from a cardiac perspective and is without symptoms of angina or cardiac decompensation.  No dizziness, presyncope, or syncope.  No falls or symptoms concerning for bleeding.  She self discontinued Repatha  approximately 2 months prior indicating she has been unable  to give herself injections and her daughter's work schedule does not allow for this.  She was on Repatha , rosuvastatin , and ezetimibe  with adherence when labs were obtained in 02/2024 and demonstrated an LDL of 197.  She has follow-up with the lipid clinic next week.  Blood pressure is elevated this morning, though she has not taken clonidine .  She reports blood pressure is well-controlled at home.   Labs independently reviewed: 02/2024 - BUN 13, serum creatinine 0.97, potassium 4.2, albumin 4.7, AST/ALT normal, A1c 6.3, TSH normal, TC 274, TG 152, HDL 49, LDL 197, Hgb 12.9, PLT 520  Past Medical History:  Diagnosis Date   Anxiety    Arthritis    CAD (coronary artery disease)    a. PCI to proximal and distal RCA 3/26   Cancer [facility])    Mom grandma   Chest pain 04/09/2019   Chronic kidney disease    Mom   Depression 2023   Diabetes mellitus without complication (HCC)    GERD (gastroesophageal reflux disease)    Heart murmur    MR   Hiatal hernia    Hot flashes    Hyperlipidemia    Hypertension    Myocardial infarction (HCC)    Neuropathy    Thyroid  disease    Mom   Tobacco abuse    a. Started at age 23, quit during 3 pregnancies. b. 1 PPD for a 40 pack-year hx (2019)     Past Surgical History:  Procedure Laterality Date   ABDOMINAL AORTIC ANEURYSM REPAIR     2019   BREAST BIOPSY Right    Multiple biopsies-all benign   BREAST SURGERY  1986   I&D for 'milk duct'   COLONOSCOPY WITH PROPOFOL  N/A 07/07/2022   Procedure: COLONOSCOPY WITH PROPOFOL ;  Surgeon: Therisa Bi, MD;  Location: [facility] ENDOSCOPY;  Service: Gastroenterology;  Laterality: N/A;   CORONARY STENT INTERVENTION N/A 01/13/2019   Procedure: CORONARY STENT INTERVENTION;  Surgeon: Mady Bruckner, MD;  Location: [facility] INVASIVE CV LAB;  Service: Cardiovascular;  Laterality: N/A;   EMBOLECTOMY  08/13/2018   Procedure: Right SFA and profunda femoris Fogarty embolectomy 3  Fogarty embolectomy balloon;  Surgeon: Marea Selinda RAMAN, MD;  Location: [facility] ORS;  Service: Vascular;;   ENDARTERECTOMY FEMORAL Right 08/13/2018   Procedure: Right common femoral, profunda femoris, and superficial femoral artery endarterectomies and patch angioplasty ;  Surgeon: Marea Selinda RAMAN, MD;  Location: [facility] ORS;  Service: Vascular;  Laterality: Right;   ENDOVASCULAR STENT GRAFT (AAA) N/A 08/13/2018   Procedure: ENDOVASCULAR REPAIR/STENT GRAFT;  Surgeon: Jama Cordella MATSU, MD;  Location: [facility] INVASIVE CV LAB;  Service: Cardiovascular;  Laterality: N/A;   LEFT HEART CATH AND CORONARY ANGIOGRAPHY Left 01/13/2019   Procedure: LEFT HEART CATH AND CORONARY ANGIOGRAPHY;  Surgeon: Perla Evalene PARAS, MD;  Location: [facility] INVASIVE CV LAB;  Service: Cardiovascular;  Laterality: Left;   LEFT HEART CATH AND CORONARY ANGIOGRAPHY Left 07/24/2023   Procedure: LEFT HEART CATH AND CORONARY ANGIOGRAPHY;  Surgeon: Darron Deatrice LABOR, MD;  Location: [facility] INVASIVE CV LAB;  Service: Cardiovascular;  Laterality: Left;   LOWER EXTREMITY ANGIOGRAPHY Right 09/08/2022  Procedure: Lower Extremity Angiography;  Surgeon: Marea Selinda RAMAN, MD;  Location: [facility] INVASIVE CV LAB;  Service: Cardiovascular;  Laterality: Right;   LOWER EXTREMITY ANGIOGRAPHY Right 09/15/2022   Procedure: Lower Extremity Angiography;  Surgeon: Marea Selinda RAMAN, MD;  Location: [facility] INVASIVE CV LAB;  Service: Cardiovascular;  Laterality: Right;   SALPINGOOPHORECTOMY Left 2000    Current Medications: Current Meds  Medication Sig   amLODipine  (NORVASC ) 10 MG tablet Take 1 tablet by mouth once daily   aspirin  81 MG EC tablet Take 1 tablet (81 mg total) by mouth daily.   Blood Pressure Monitoring (BLOOD PRESSURE CUFF) MISC Check blood pressure as instructed by your physician   cloNIDine  (CATAPRES ) 0.2 MG tablet Take 1 tablet (0.2 mg total) by mouth in the morning and at bedtime. Take an extra 0.1 mg for BP >160   clopidogrel  (PLAVIX ) 75 MG tablet Take 1 tablet (75 mg total) by mouth daily.   Evolocumab  (REPATHA   SURECLICK) 140 MG/ML SOAJ Inject 140 mg into the skin every 14 (fourteen) days.   ezetimibe  (ZETIA ) 10 MG tablet Take 1 tablet (10 mg total) by mouth daily.   FARXIGA  10 MG TABS tablet Take 1 tablet by mouth once daily   gabapentin  (NEURONTIN ) 600 MG tablet Take 1 tablet (600 mg total) by mouth 3 (three) times daily as needed.   losartan  (COZAAR ) 100 MG tablet Take 1 tablet by mouth once daily   metoprolol  succinate (TOPROL -XL) 50 MG 24 hr tablet TAKE 1 TABLET BY MOUTH ONCE DAILY.  TAKE WITH OR IMMEDIATELY AFTER A MEAL   pantoprazole  (PROTONIX ) 40 MG tablet Take 1 tablet (40 mg total) by mouth daily.   rosuvastatin  (CRESTOR ) 40 MG tablet Take 1 tablet (40 mg total) by mouth daily.   venlafaxine  (EFFEXOR ) 75 MG tablet Take 1 tablet (75 mg total) by mouth 2 (two) times daily with a meal.    Allergies:   Strawberry extract and Strawberry flavoring agent (non-screening)   Social History   Socioeconomic History   Marital status: Single    Spouse name: Not on file   Number of children: 3   Years of education: Not on file   Highest education level: Associate degree: occupational, Scientist, product/process development, or vocational program  Occupational History   Not on file  Tobacco Use   Smoking status: Former    Current packs/day: 0.00    Average packs/day: 0.3 packs/day for 38.0 years (9.5 ttl pk-yrs)    Types: Cigarettes    Start date: 08/10/1980    Quit date: 08/10/2018    Years since quitting: 5.9    Passive exposure: Past   Smokeless tobacco: Never   Tobacco comments:    Quit 08-10-2018  Vaping Use   Vaping status: Never Used  Substance and Sexual Activity   Alcohol use: Not Currently    Comment: occassional,none last 24hrs   Drug use: Not Currently    Types: Marijuana    Comment: Smoke caused severe chest pain stopped immediately   Sexual activity: Yes    Birth control/protection: Post-menopausal  Other Topics Concern   Not on file  Social History Narrative   Lives with youngest daughter  Clarnce. 2 Sons lives in [place]. 2 indoor dogs.   Social Drivers of Health   Financial Resource Strain: High Risk (05/15/2024)   Overall Financial Resource Strain (CARDIA)    Difficulty of Paying Living Expenses: Hard  Food Insecurity: Food Insecurity Present (05/15/2024)   Hunger Vital Sign    Worried About Running Out  of Food in the Last Year: Often true    Ran Out of Food in the Last Year: Often true  Transportation Needs: No Transportation Needs (05/15/2024)   PRAPARE - Administrator, Civil Service (Medical): No    Lack of Transportation (Non-Medical): No  Physical Activity: Insufficiently Active (05/15/2024)   Exercise Vital Sign    Days of Exercise per Week: 2 days    Minutes of Exercise per Session: 20 min  Stress: Stress Concern Present (05/15/2024)   [employer] of Occupational Health - Occupational Stress Questionnaire    Feeling of Stress: To some extent  Social Connections: Socially Isolated (05/15/2024)   Social Connection and Isolation Panel    Frequency of Communication with Friends and Family: More than three times a week    Frequency of Social Gatherings with Friends and Family: Never    Attends Religious Services: Never    Diplomatic Services operational officer: No    Attends Engineer, structural: Not on file    Marital Status: Separated     Family History:  The patient's family history includes Asthma (age of onset: 76) in her mother; Breast cancer in her mother; Breast cancer (age of onset: 31) in her maternal grandmother; Diabetes in her maternal grandmother and mother; Heart attack in her mother; Heart disease in her maternal grandmother and mother; Hyperlipidemia in her maternal grandmother and mother; Hypertension in her mother; Sarcoidosis in her mother.  ROS:   12-point review of systems is negative unless otherwise noted in the HPI.   EKGs/Labs/Other Studies Reviewed:    Studies reviewed were summarized above. The additional studies  were reviewed today:  2D echo 09/16/2023: 1. Left ventricular ejection fraction, by estimation, is 60 to 65%. The  left ventricle has normal function. The left ventricle has no regional  wall motion abnormalities. Left ventricular diastolic parameters are  consistent with Grade I diastolic  dysfunction (impaired relaxation). The average left ventricular global  longitudinal strain is -17.2 %.   2. Right ventricular systolic function is normal. The right ventricular  size is normal. Tricuspid regurgitation signal is inadequate for assessing  PA pressure.   3. The mitral valve is normal in structure. Mild mitral valve  regurgitation. No evidence of mitral stenosis.   4. Tricuspid valve regurgitation is mild to moderate.   5. The aortic valve is normal in structure. Aortic valve regurgitation is  not visualized. No aortic stenosis is present.   6. The inferior vena cava is normal in size with greater than 50%  respiratory variability, suggesting right atrial pressure of 3 mmHg.  ___________   LHC 07/24/2023:   Ost 1st Mrg to 1st Mrg lesion is 60% stenosed.   Ost LAD to Prox LAD lesion is 40% stenosed.   Prox Cx to Mid Cx lesion is 50% stenosed.   Prox RCA to Mid RCA lesion is 20% stenosed.   Dist RCA lesion is 10% stenosed.   Mid RCA lesion is 30% stenosed.   RPAV lesion is 80% stenosed.   The left ventricular systolic function is normal.   LV end diastolic pressure is mildly elevated.   The left ventricular ejection fraction is 55-65% by visual estimate.   1.  Widely patent RCA stents with mild in-stent restenosis.  Stable mild to moderate LAD and left circumflex disease.  There is new 80% stenosis in the right posterior AV groove artery but vessel diameter is at most 2 mm in that area. 2.  Normal LV systolic function and mildly elevated left ventricular end-diastolic pressure.   Recommendations: Recommend continued aggressive medical therapy. The right posterior AV groove artery  is too small to stent. ___________   Renal artery ultrasound 08/10/2019: Summary:  Largest Aortic Diameter: 2.1 cm    Renal:    Right: Normal size right kidney. No evidence of right renal artery         stenosis. Normal right Resisitive Index. Normal cortical         thickness of right kidney. RRV flow present.  Left:  Normal size of left kidney. No evidence of left renal artery         stenosis. Normal left Resistive Index. Abnormal cortical         thickness of the left kidney. LRV flow present.  Mesenteric:  Normal Celiac artery and Superior Mesenteric artery findings.  __________   2D echo 02/23/2019: 1. The left ventricle has low normal systolic function, with an ejection  fraction of 50-55%. The cavity size was normal. There is mildly increased  left ventricular wall thickness. Unable to exclude mild hypokinesis of the  inferior wall. Left ventricular   diastolic Doppler parameters are consistent with impaired relaxation.   2. The right ventricle has normal systolic function. The cavity was  normal. There is no increase in right ventricular wall thickness. Unable  to estimate RVSP.  __________   [facility] 01/13/2019: Coronary dominance: Right dominant  Left mainstem: Large vessel that bifurcates into the LAD and left circumflex, no significant disease noted  Left anterior descending (LAD): Large vessel that extends to the apical region, diagonal branch 2 of moderate size, mild proximal LAD disease estimated at 30%  Left circumflex (LCx): Large vessel with OM branch 2, mild mid left circumflex disease estimated at 40%, high OM branches small bifurcating diffusely diseased 60% proximal disease  Right coronary artery (RCA): Right dominant vessel with PL and PDA, moderate to large sized vessel 75% proximal napkin ring lesion, with 80% distal stenosis circumferential  Left ventriculography: Left ventricular systolic function is normal, LVEF is estimated at 55-65%, there is no  significant mitral regurgitation , no significant aortic valve stenosis  Final Conclusions:  Single-vessel disease notably of the RCA moderate to severe proximal disease estimated 75% and distal RCA disease estimated at 80% There is disease of high OM vessel, small in caliber diffusely diseased Mild proximal LAD disease  Recommendations:  Case discussed with interventional cardiology, Dr. END Given functional study/cardiac CTA showing stenosis in RCA by FFR and given her symptoms concerning for unstable angina, will consider stenting of proximal RCA and distal RCA Medical management for disease on the left system    PCI 01/13/2019: Conclusions: Severe single vessel coronary artery disease with diffuse disease involving the RCA with focal lesions of up to 80% involving the proximal and distal vessel (see diagnostic angiography report by Dr. Gollan for details). Successful PCI to the proximal RCA using a Resolute Onyx 2.75 x 22 mm drug-eluting stent with 0% residual stenosis and TIMI-3 flow. Successful PCI to the distal RCA using a Resolute Onyx 2.75 x 18 mm drug-eluting stent with 0% residual stenosis and TIMI-3 flow.   Recommendations: Overnight extended recovery. Dual antiplatelet therapy with aspirin  and clopidogrel  for at least 12 months, ideally longer given multivessel coronary artery and peripheral vascular disease. Aggressive secondary prevention. __________   Coronary CTA 01/04/2019: FINDINGS: Coronary calcium  score: The patient's coronary artery calcium  score is 51, which places the patient in the 94 percentile.  Coronary arteries: Normal coronary origins.  Right dominance.   Right Coronary Artery: Proximal RCA atherosclerotic plaque with moderate stenosis (50-69%). Possible severe stenosis in the mid RCA, 70-99 % stenosis, atherosclerotic plaque. This area is poorly visualized due to motion and image resolution.   Left Main Coronary Artery: No detectable plaque or  stenosis.   Left Anterior Descending Coronary Artery: Tubular plaque progresses from mild to severe stenosis in proximal LAD. At narrowest point, severe mixed atherosclerotic plaque with positive remodeling, 70-99% stenosis. Mild diffuse plaque in mid and distal LAD.   Left Circumflex Artery: Severe stenosis in the proximal L circumflex with atherosclerotic plaque, 70-99% stenosis.   Aorta: Normal size, 34 mm at the mid ascending aorta (level of the PA bifurcation) measured double oblique. No calcifications. No dissection.   Aortic Valve: No calcifications.   Other findings:   Normal pulmonary vein drainage into the left atrium.   Normal left atrial appendage without a thrombus.   Normal size of the pulmonary artery.   IMPRESSION: 1. Severe CAD, CADRADS = 4V. Severe atherosclerotic plaque with 70-99% stenosis in the proximal LAD, proximal L circumflex and mid RCA. Positive remodeling with spotty calcifications in the proximal LAD, suggesting vulnerable plaque characteristics. 2. The patient's coronary artery calcium  score is 51, which places the patient in the 94 percentile. 3. Normal coronary origin with right dominance.   ctFFR: 1. Left Main:  No significant stenosis. FFR = 0.98 2. LAD: Proximal FFR = 0.77, Mid FFR = 0.75, Distal FFR = 0.73 3. LCX: Proximal FFR = 0.79, Distal FFR = 0.68 4. RCA: Proximal FFR = 0.95, Mid FFR = 0.67, Distal FFR = 0.52   IMPRESSION: 1. CT FFR analysis showed hemodynamically significant stenosis in the mid RCA. Indeterminate FFR in the proximal LAD and proximal L circumflex lesions. Consider coronary angiography. ___________   2D echo 02/06/2016: - Left ventricle: The cavity size was normal. Systolic function was    normal. The estimated ejection fraction was in the range of 55%    to 60%. Wall motion was normal; there were no regional wall    motion abnormalities. Left ventricular diastolic function    parameters were normal.  - Mitral  valve: There was mild regurgitation.  - Right ventricle: Systolic function was normal.  - Pulmonary arteries: Systolic pressure was within the normal    range.  - Pericardium, extracardiac: A trivial pericardial effusion was    identified.  __________   24-hour Holter 01/2016: Overall rhythm was sinus rhythm, with average heart rate of 88 BPM. Minimal heart rate of 57 bpm at 5:24 AM 02/06/2016. Maximum heart rate of 136 BPM and 1747 02/05/2016. 10% total number of beats in tachycardia. Longest RR interval was 1.46 seconds.   No high grade supraventricular ectopy: 4 isolated PACs.    Ventricular ectopy: 14,946 isolated PVCs. 53 ventricular couplets. No ventricular runs. 12% of total number of beats were ventricular beats.   No documented atrial fibrillation. __________   Treadmill MPI 01/23/2016: Blood pressure demonstrated a hypertensive response to exercise. There was no ST segment deviation noted during stress. No T wave inversion was noted during stress.   Exercise myocardial perfusion imaging study with no significant  ischemia Normal wall motion, EF estimated at 46% Depressed EF likely secondary to GI uptake artifact  No EKG changes concerning for ischemia at peak stress or in recovery. Rare PVCs noted Good exercise tolerance, 10.1 METS achieved Low risk scan   EKG:  EKG is ordered today.  The EKG ordered today demonstrates NSR, 68 bpm, first-degree AV block, baseline wandering, nonspecific inferolateral ST-T changes consistent with prior tracings  Recent Labs: 03/15/2024: ALT 20; BUN 13; Creatinine, Ser 0.97; Hemoglobin 12.9; Platelets 520; Potassium 4.2; Sodium 141; TSH 2.190  Recent Lipid Panel    Component Value Date/Time   CHOL 274 (H) 03/15/2024 1326   TRIG 152 (H) 03/15/2024 1326   HDL 49 03/15/2024 1326   CHOLHDL 5.6 (H) 03/15/2024 1326   CHOLHDL 4.5 07/09/2020 1803   VLDL 34 07/09/2020 1803   LDLCALC 197 (H) 03/15/2024 1326   LDLDIRECT 185 (H) 12/06/2018 1017     PHYSICAL EXAM:    VS:  BP (!) 160/80 (BP Location: Left Arm, Patient Position: Sitting, Cuff Size: Normal)   Pulse 68   Ht 5' 6 (1.676 m)   Wt 168 lb 12.8 oz (76.6 kg)   SpO2 99%   BMI 27.25 kg/m   BMI: Body mass index is 27.25 kg/m.  Physical Exam Vitals reviewed.  Constitutional:      Appearance: She is well-developed.  HENT:     Head: Normocephalic and atraumatic.  Eyes:     General:        Right eye: No discharge.        Left eye: No discharge.  Cardiovascular:     Rate and Rhythm: Normal rate and regular rhythm.     Heart sounds: Normal heart sounds, S1 normal and S2 normal. Heart sounds not distant. No midsystolic click and no opening snap. No murmur heard.    No friction rub.  Pulmonary:     Effort: Pulmonary effort is normal. No respiratory distress.     Breath sounds: Normal breath sounds. No decreased breath sounds, wheezing, rhonchi or rales.  Musculoskeletal:     Cervical back: Normal range of motion.     Right lower leg: No edema.     Left lower leg: No edema.  Skin:    General: Skin is warm and dry.     Nails: There is no clubbing.  Neurological:     Mental Status: She is alert and oriented to person, place, and time.  Psychiatric:        Speech: Speech normal.        Behavior: Behavior normal.        Thought Content: Thought content normal.        Judgment: Judgment normal.     Wt Readings from Last 3 Encounters:  07/28/24 168 lb 12.8 oz (76.6 kg)  05/16/24 169 lb 1.6 oz (76.7 kg)  03/18/24 176 lb (79.8 kg)     ASSESSMENT & PLAN:   CAD involving the native coronary arteries with stable angina: She is doing well and without symptoms of angina or cardiac decompensation.  LHC in 07/2023 showed patent RCA stents with mild in-stent restenosis and otherwise nonobstructive disease involving the left coronary tree with medical therapy recommended. Continue aggressive risk factor modification and secondary prevention including aspirin  81 mg,  clopidogrel  75 mg, amlodipine  10 mg, ezetimibe  10 mg, rosuvastatin  40 mg, and metoprolol  succinate 50 mg.  No indication for further ischemic testing at this time.   HTN: Blood pressure is elevated at triage though typically well-controlled.  She has not taken all of her antihypertensive medication this morning.  She remains on amlodipine  10 mg, losartan  100 mg, and Toprol -XL 50 mg along with as needed clonidine  0.2 mg for systolic blood pressure greater than 160 mmHg.  If blood pressure continues to run  elevated on a consistent basis could transition losartan  to higher intensity ARB.  Low-sodium diet encouraged.  HLD: Total cholesterol 274, triglyceride 152, and LDL 197 in 02/2024 with normal AST/ALT at that time.  Target LDL less than 55.  Concern for familial hyperlipidemia.  Obtain LP(a).  She self discontinued Repatha  given inability to self administer injection.  She was on Repatha , rosuvastatin , and ezetimibe  when cholesterol was obtained in 02/2024.  She remains on rosuvastatin  40 mg and ezetimibe  10 mg.  She has follow-up with the lipid clinic next week.  May benefit from initiation of inclisiran.  AAA: Status post endovascular repair.  Ongoing management and follow-up per vascular surgery.  Recommend optimal blood pressure control.  PAD: No symptoms of lifestyle-limiting claudication or nonhealing wounds.  Followed by vascular surgery.  Remains on aspirin  and rosuvastatin .     Disposition: F/u with Dr. Gollan or an APP in 6 months.   Medication Adjustments/Labs and Tests Ordered: Current medicines are reviewed at length with the patient today.  Concerns regarding medicines are outlined above. Medication changes, Labs and Tests ordered today are summarized above and listed in the Patient Instructions accessible in Encounters.   Signed, Bernardino Bring, PA-C 07/28/2024 12:34 PM     [facility] [place] [address] [place], [place] [address] [phone]

## 2024-07-28 ENCOUNTER — Encounter: Payer: Self-pay | Admitting: Physician Assistant

## 2024-07-28 ENCOUNTER — Ambulatory Visit: Attending: Physician Assistant | Admitting: Physician Assistant

## 2024-07-28 DIAGNOSIS — I10 Essential (primary) hypertension: Secondary | ICD-10-CM | POA: Diagnosis not present

## 2024-07-28 DIAGNOSIS — I739 Peripheral vascular disease, unspecified: Secondary | ICD-10-CM

## 2024-07-28 DIAGNOSIS — I714 Abdominal aortic aneurysm, without rupture, unspecified: Secondary | ICD-10-CM | POA: Diagnosis not present

## 2024-07-28 DIAGNOSIS — E785 Hyperlipidemia, unspecified: Secondary | ICD-10-CM

## 2024-07-28 DIAGNOSIS — I25118 Atherosclerotic heart disease of native coronary artery with other forms of angina pectoris: Secondary | ICD-10-CM

## 2024-07-28 NOTE — Patient Instructions (Signed)
 Medication Instructions:  Your physician recommends that you continue on your current medications as directed. Please refer to the Current Medication list given to you today.   *If you need a refill on your cardiac medications before your next appointment, please call your pharmacy*  Lab Work: Your provider would like for you to have following labs drawn today L[facility].   If you have labs (blood work) drawn today and your tests are completely normal, you will receive your results only by: MyChart Message (if you have MyChart) OR A paper copy in the mail If you have any lab test that is abnormal or we need to change your treatment, we will call you to review the results.  Follow-Up: At [facility], you and your health needs are our priority.  As part of our continuing mission to provide you with exceptional heart care, our providers are all part of one team.  This team includes your primary Cardiologist (physician) and Advanced Practice Providers or APPs (Physician Assistants and Nurse Practitioners) who all work together to provide you with the care you need, when you need it.  Your next appointment:   6 month(s)  Provider:   You may see Timothy Gollan, MD or Bernardino Bring, PA-C

## 2024-07-29 ENCOUNTER — Ambulatory Visit: Payer: Self-pay | Admitting: Physician Assistant

## 2024-07-29 LAB — LIPOPROTEIN A (LPA): Lipoprotein (a): 49.8 nmol/L (ref <75.0)

## 2024-08-01 ENCOUNTER — Ambulatory Visit (INDEPENDENT_AMBULATORY_CARE_PROVIDER_SITE_OTHER): Admitting: Internal Medicine

## 2024-08-01 VITALS — BP 160/90 | HR 65 | Ht 66.0 in | Wt 171.0 lb

## 2024-08-01 DIAGNOSIS — I25118 Atherosclerotic heart disease of native coronary artery with other forms of angina pectoris: Secondary | ICD-10-CM

## 2024-08-01 DIAGNOSIS — I739 Peripheral vascular disease, unspecified: Secondary | ICD-10-CM | POA: Diagnosis not present

## 2024-08-01 DIAGNOSIS — E785 Hyperlipidemia, unspecified: Secondary | ICD-10-CM

## 2024-08-01 MED ORDER — ROSUVASTATIN CALCIUM 40 MG PO TABS: 40.0000 mg | ORAL_TABLET | Freq: Every day | ORAL | 3 refills | Status: AC

## 2024-08-01 MED ORDER — EZETIMIBE 10 MG PO TABS: 10.0000 mg | ORAL_TABLET | Freq: Every day | ORAL | 3 refills | Status: AC

## 2024-08-01 NOTE — Progress Notes (Signed)
 LIPID CLINIC CONSULT NOTE  Chief Complaint:  Manage dyslipidemia  Primary Care Physician: Knute Thersia Bitters, FNP  Primary Cardiologist:  Evalene Lunger, MD  HPI:  Heather Mahoney is a 58 y.o. female who is being seen today for the evaluation of dyslipidemia at the request of Abigail Bernardino HERO, PA-C. This is a pleasant 58 year old female kindly referred for evaluation management of dyslipidemia.  She has a history of coronary artery disease and prior stent to the RCA.  There is also family history of heart disease and high cholesterol.  She has been on combination therapy with Repatha , rosuvastatin  and ezetimibe .  Recent lipid showed total cholesterol of 274, LDL 197, HDL 49 and triglycerides 152.  She actually only tried 2 doses of the Repatha  but reported that she would not take further doses due to intolerance.  We then discussed other options today including Leqvio which is provider administered and would require less frequent dosing.  PMHx:  Past Medical History:  Diagnosis Date   Anxiety    Arthritis    CAD (coronary artery disease)    a. PCI to proximal and distal RCA 3/26   Cancer [facility])    Mom grandma   Chest pain 04/09/2019   Chronic kidney disease    Mom   Depression 2023   Diabetes mellitus without complication (HCC)    GERD (gastroesophageal reflux disease)    Heart murmur    MR   Hiatal hernia    Hot flashes    Hyperlipidemia    Hypertension    Myocardial infarction (HCC)    Neuropathy    Thyroid  disease    Mom   Tobacco abuse    a. Started at age 89, quit during 3 pregnancies. b. 1 PPD for a 40 pack-year hx (2019)     Past Surgical History:  Procedure Laterality Date   ABDOMINAL AORTIC ANEURYSM REPAIR     2019   BREAST BIOPSY Right    Multiple biopsies-all benign   BREAST SURGERY  1986   I&D for 'milk duct'   COLONOSCOPY WITH PROPOFOL  N/A 07/07/2022   Procedure: COLONOSCOPY WITH PROPOFOL ;  Surgeon: Therisa Bi, MD;  Location: [facility] ENDOSCOPY;   Service: Gastroenterology;  Laterality: N/A;   CORONARY STENT INTERVENTION N/A 01/13/2019   Procedure: CORONARY STENT INTERVENTION;  Surgeon: Mady Bruckner, MD;  Location: [facility] INVASIVE CV LAB;  Service: Cardiovascular;  Laterality: N/A;   EMBOLECTOMY  08/13/2018   Procedure: Right SFA and profunda femoris Fogarty embolectomy 3  Fogarty embolectomy balloon;  Surgeon: Marea Selinda RAMAN, MD;  Location: [facility] ORS;  Service: Vascular;;   ENDARTERECTOMY FEMORAL Right 08/13/2018   Procedure: Right common femoral, profunda femoris, and superficial femoral artery endarterectomies and patch angioplasty ;  Surgeon: Marea Selinda RAMAN, MD;  Location: [facility] ORS;  Service: Vascular;  Laterality: Right;   ENDOVASCULAR STENT GRAFT (AAA) N/A 08/13/2018   Procedure: ENDOVASCULAR REPAIR/STENT GRAFT;  Surgeon: Jama Cordella MATSU, MD;  Location: [facility] INVASIVE CV LAB;  Service: Cardiovascular;  Laterality: N/A;   LEFT HEART CATH AND CORONARY ANGIOGRAPHY Left 01/13/2019   Procedure: LEFT HEART CATH AND CORONARY ANGIOGRAPHY;  Surgeon: Lunger Evalene PARAS, MD;  Location: [facility] INVASIVE CV LAB;  Service: Cardiovascular;  Laterality: Left;   LEFT HEART CATH AND CORONARY ANGIOGRAPHY Left 07/24/2023   Procedure: LEFT HEART CATH AND CORONARY ANGIOGRAPHY;  Surgeon: Darron Deatrice LABOR, MD;  Location: [facility] INVASIVE CV LAB;  Service: Cardiovascular;  Laterality: Left;   LOWER EXTREMITY ANGIOGRAPHY Right 09/08/2022  Procedure: Lower Extremity Angiography;  Surgeon: Marea Selinda RAMAN, MD;  Location: [facility] INVASIVE CV LAB;  Service: Cardiovascular;  Laterality: Right;   LOWER EXTREMITY ANGIOGRAPHY Right 09/15/2022   Procedure: Lower Extremity Angiography;  Surgeon: Marea Selinda RAMAN, MD;  Location: [facility] INVASIVE CV LAB;  Service: Cardiovascular;  Laterality: Right;   SALPINGOOPHORECTOMY Left 2000    FAMHx:  Family History  Problem Relation Age of Onset   Breast cancer Mother    Diabetes Mother    Hypertension Mother    Hyperlipidemia Mother     Heart disease Mother        CABG X 4   Asthma Mother 67   Sarcoidosis Mother        In remission   Heart attack Mother    Breast cancer Maternal Grandmother 82   Diabetes Maternal Grandmother    Heart disease Maternal Grandmother    Hyperlipidemia Maternal Grandmother     SOCHx:   reports that she quit smoking about 5 years ago. Her smoking use included cigarettes. She started smoking about 44 years ago. She has a 9.5 pack-year smoking history. She has been exposed to tobacco smoke. She has never used smokeless tobacco. She reports that she does not currently use alcohol. She reports that she does not currently use drugs after having used the following drugs: Marijuana.  ALLERGIES:  Allergies  Allergen Reactions   Strawberry Extract Hives and Swelling   Strawberry Flavoring Agent (Non-Screening) Rash    ROS: Pertinent items noted in HPI and remainder of comprehensive ROS otherwise negative.  HOME MEDS: Current Outpatient Medications on File Prior to Visit  Medication Sig Dispense Refill   amLODipine  (NORVASC ) 10 MG tablet Take 1 tablet by mouth once daily 90 tablet 2   aspirin  81 MG EC tablet Take 1 tablet (81 mg total) by mouth daily. 90 tablet 3   Blood Pressure Monitoring (BLOOD PRESSURE CUFF) MISC Check blood pressure as instructed by your physician 1 each 0   cloNIDine  (CATAPRES ) 0.2 MG tablet Take 1 tablet (0.2 mg total) by mouth in the morning and at bedtime. Take an extra 0.1 mg for BP >160 90 tablet 3   clopidogrel  (PLAVIX ) 75 MG tablet Take 1 tablet (75 mg total) by mouth daily. 30 tablet 3   Evolocumab  (REPATHA  SURECLICK) 140 MG/ML SOAJ Inject 140 mg into the skin every 14 (fourteen) days. 6 mL 3   ezetimibe  (ZETIA ) 10 MG tablet Take 1 tablet (10 mg total) by mouth daily. 30 tablet 3   FARXIGA  10 MG TABS tablet Take 1 tablet by mouth once daily 30 tablet 5   gabapentin  (NEURONTIN ) 600 MG tablet Take 1 tablet (600 mg total) by mouth 3 (three) times daily as needed. 90  tablet 5   losartan  (COZAAR ) 100 MG tablet Take 1 tablet by mouth once daily 90 tablet 2   metoprolol  succinate (TOPROL -XL) 50 MG 24 hr tablet TAKE 1 TABLET BY MOUTH ONCE DAILY.  TAKE WITH OR IMMEDIATELY AFTER A MEAL 90 tablet 2   pantoprazole  (PROTONIX ) 40 MG tablet Take 1 tablet (40 mg total) by mouth daily. 90 tablet 3   rosuvastatin  (CRESTOR ) 40 MG tablet Take 1 tablet (40 mg total) by mouth daily. 30 tablet 3   venlafaxine  (EFFEXOR ) 75 MG tablet Take 1 tablet (75 mg total) by mouth 2 (two) times daily with a meal. 60 tablet 5   No current facility-administered medications on file prior to visit.    LABS/IMAGING: No results found for this  or any previous visit (from the past 48 hours). No results found.  LIPID PANEL:    Component Value Date/Time   CHOL 274 (H) 03/15/2024 1326   TRIG 152 (H) 03/15/2024 1326   HDL 49 03/15/2024 1326   CHOLHDL 5.6 (H) 03/15/2024 1326   CHOLHDL 4.5 07/09/2020 1803   VLDL 34 07/09/2020 1803   LDLCALC 197 (H) 03/15/2024 1326   LDLDIRECT 185 (H) 12/06/2018 1017    Lipoprotein (a)  Date/Time Value Ref Range Status  07/28/2024 09:59 AM 49.8 <75.0 nmol/L Final    Comment:    Note:  Values greater than or equal to 75.0 nmol/L may        indicate an independent risk factor for CHD,        but must be evaluated with caution when applied        to non-Caucasian populations due to the        influence of genetic factors on Lp(a) across        ethnicities.      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   Ms. Pincus has a likely familial hyperlipidemia but her cholesterol remains well above target on combination therapy.  She only tried Repatha  for 2 doses but says she could not tolerated.  She is interested in another option which might be Leqvio.  Will try to reach out for prior authorization for this.  This could give her an additional 50% reduction in her cholesterol which would be helpful.  She did have an assessment of LP(a) which was negative.  If approved we will plan repeat lipids in about 6 months.  She can likely follow-up with her cardiology team in [place] at that time.  Thanks again for the kind referral.  Vinie KYM Maxcy, MD, [facility], FNLA, FACP  [place]  [facility]  Medical Director of the Advanced Lipid Disorders &  Cardiovascular Risk Reduction Clinic Diplomate of the American Board of Clinical Lipidology Attending Cardiologist  Direct Dial: [phone]  Fax: [phone]  Website:  [email or web address]  Vinie BROCKS [place] Dube 08/01/2024, 9:22 AM

## 2024-08-01 NOTE — Patient Instructions (Signed)
 Medication Instructions:  Dr. Mona has recommended an injectable medication called LEQVIO. This is administered by a health care provider. The frequency of injections is TWO injections given 3 months apart (loading dose) and then every 6 months after that. The injection appointments at [facility] ([address] [place], [place]  [address]). Once we have the benefits check information, we will reach out to let you know if the medication is covered 100%, if there is a deductible, co-insurance, out-of-pocket max. From there, we will see if you need patient assistance and our team will take care of working on this. Because of the frequency schedule of this medication, your follow up/repeat cholesterol lab work will be about 5-6 months from now.   *If you need a refill on your cardiac medications before your next appointment, please call your pharmacy*  Lab Work: FASTING lab work in 5-6 months  If you have labs (blood work) drawn today and your tests are completely normal, you will receive your results only by: MyChart Message (if you have MyChart) OR A paper copy in the mail If you have any lab test that is abnormal or we need to change your treatment, we will call you to review the results.   Follow-Up: At [facility], you and your health needs are our priority.  As part of our continuing mission to provide you with exceptional heart care, our providers are all part of one team.  This team includes your primary Cardiologist (physician) and Advanced Practice Providers or APPs (Physician Assistants and Nurse Practitioners) who all work together to provide you with the care you need, when you need it.  Your next appointment:    5-6 months with Dr. Gollan or Bernardino Bring PA  We recommend signing up for the patient portal called MyChart.  Sign up information is provided on this After Visit Summary.  MyChart is used to connect with patients for Virtual Visits  (Telemedicine).  Patients are able to view lab/test results, encounter notes, upcoming appointments, etc.  Non-urgent messages can be sent to your provider as well.   To learn more about what you can do with MyChart, go to [email or web address].   Other Instructions

## 2024-08-04 ENCOUNTER — Telehealth: Payer: Self-pay | Admitting: Internal Medicine

## 2024-08-04 NOTE — Telephone Encounter (Signed)
 Faxed Leqvio benefits check form.  Routed to [facility] pharmacy team

## 2024-08-05 ENCOUNTER — Telehealth: Payer: Self-pay

## 2024-08-05 DIAGNOSIS — E119 Type 2 diabetes mellitus without complications: Secondary | ICD-10-CM | POA: Diagnosis not present

## 2024-08-05 DIAGNOSIS — H40003 Preglaucoma, unspecified, bilateral: Secondary | ICD-10-CM | POA: Diagnosis not present

## 2024-08-05 LAB — OPHTHALMOLOGY REPORT-SCANNED

## 2024-08-05 NOTE — Telephone Encounter (Signed)
 Dr. Mona and Jenna, patient will be scheduled as soon as possible.  Auth Submission: APPROVED Site of care: Site of care: [facility] Payer: [employer] dual complete medicare Medication & CPT/J Code(s) submitted: Leqvio (Inclisiran) [id number] Diagnosis Code:  Route of submission (phone, fax, portal): portal Phone # Fax # Auth type: Buy/Bill PB Units/visits requested: 284mg  x 3 doses Reference number: [id number] Approval from: 08/05/24 to 08/05/25

## 2024-08-10 NOTE — Telephone Encounter (Signed)
 1st leqvio injection 08/17/24

## 2024-08-16 ENCOUNTER — Encounter (HOSPITAL_BASED_OUTPATIENT_CLINIC_OR_DEPARTMENT_OTHER): Payer: Self-pay

## 2024-08-16 ENCOUNTER — Ambulatory Visit (HOSPITAL_BASED_OUTPATIENT_CLINIC_OR_DEPARTMENT_OTHER): Admitting: Family Medicine

## 2024-08-17 ENCOUNTER — Ambulatory Visit

## 2024-08-17 VITALS — BP 192/122 | HR 83 | Temp 98.6°F | Resp 16 | Ht 66.0 in | Wt 169.2 lb

## 2024-08-17 MED ORDER — INCLISIRAN SODIUM 284 MG/1.5ML ~~LOC~~ SOSY: 284.0000 mg | PREFILLED_SYRINGE | Freq: Once | SUBCUTANEOUS | Status: AC

## 2024-08-17 NOTE — Progress Notes (Unsigned)
 Pt arrived at the infusion clinic to get her Leqvio injection. Pt bp was 186/122 in the right arm and 5 mins later bp was rechecked again and it was 192/122 in the left arm. Pt stated that she did not take her 4 blood pressure meds until 10 am. Pt was rescheduled to come back 08/18/24 at 8:15 am and advised to take bp meds before she comes. Pt verbalized understanding.

## 2024-08-18 ENCOUNTER — Ambulatory Visit (INDEPENDENT_AMBULATORY_CARE_PROVIDER_SITE_OTHER): Admitting: *Deleted

## 2024-08-18 VITALS — BP 128/83 | HR 59 | Temp 98.4°F | Resp 18 | Ht 66.0 in | Wt 169.4 lb

## 2024-08-18 DIAGNOSIS — E782 Mixed hyperlipidemia: Secondary | ICD-10-CM | POA: Diagnosis not present

## 2024-08-18 MED ORDER — INCLISIRAN SODIUM 284 MG/1.5ML ~~LOC~~ SOSY
284.0000 mg | PREFILLED_SYRINGE | Freq: Once | SUBCUTANEOUS | Status: AC
  Administered 2024-08-18: 284 mg via SUBCUTANEOUS
  Filled 2024-08-18: qty 1.5

## 2024-08-18 NOTE — Progress Notes (Signed)
 Diagnosis: Hyperlipidemia  Provider:  Chilton Greathouse MD  Procedure: Injection  Leqvio (inclisiran), Dose: 284 mg, Site: subcutaneous, Number of injections: 1  Injection Site(s): Left arm  Post Care: Observation period completed  Discharge: Condition: Good, Destination: Home . AVS Provided  Performed by:  Forrest Moron, RN

## 2024-10-18 ENCOUNTER — Telehealth: Payer: Self-pay | Admitting: Cardiovascular Disease

## 2024-10-18 ENCOUNTER — Other Ambulatory Visit: Payer: Self-pay

## 2024-10-18 ENCOUNTER — Inpatient Hospital Stay
Admission: EM | Admit: 2024-10-18 | Discharge: 2024-10-20 | DRG: 254 | Disposition: A | Discharge status: Home / Self Care | Source: Ambulatory Visit | Attending: Internal Medicine | Admitting: Internal Medicine

## 2024-10-18 ENCOUNTER — Encounter (HOSPITAL_BASED_OUTPATIENT_CLINIC_OR_DEPARTMENT_OTHER): Payer: Self-pay | Admitting: Internal Medicine

## 2024-10-18 ENCOUNTER — Other Ambulatory Visit (INDEPENDENT_AMBULATORY_CARE_PROVIDER_SITE_OTHER): Payer: Self-pay | Admitting: Nurse Practitioner

## 2024-10-18 ENCOUNTER — Emergency Department

## 2024-10-18 ENCOUNTER — Encounter: Payer: Self-pay | Admitting: Internal Medicine

## 2024-10-18 DIAGNOSIS — Z7901 Long term (current) use of anticoagulants: Secondary | ICD-10-CM

## 2024-10-18 DIAGNOSIS — Z9102 Food additives allergy status: Secondary | ICD-10-CM

## 2024-10-18 DIAGNOSIS — I10 Essential (primary) hypertension: Secondary | ICD-10-CM | POA: Diagnosis present

## 2024-10-18 DIAGNOSIS — E119 Type 2 diabetes mellitus without complications: Secondary | ICD-10-CM

## 2024-10-18 DIAGNOSIS — E1151 Type 2 diabetes mellitus with diabetic peripheral angiopathy without gangrene: Secondary | ICD-10-CM

## 2024-10-18 DIAGNOSIS — D75839 Thrombocytosis, unspecified: Secondary | ICD-10-CM | POA: Diagnosis present

## 2024-10-18 DIAGNOSIS — Z7982 Long term (current) use of aspirin: Secondary | ICD-10-CM

## 2024-10-18 DIAGNOSIS — I714 Abdominal aortic aneurysm, without rupture, unspecified: Secondary | ICD-10-CM

## 2024-10-18 DIAGNOSIS — F32A Depression, unspecified: Secondary | ICD-10-CM | POA: Diagnosis present

## 2024-10-18 DIAGNOSIS — Z803 Family history of malignant neoplasm of breast: Secondary | ICD-10-CM

## 2024-10-18 DIAGNOSIS — T82856A Stenosis of peripheral vascular stent, initial encounter: Principal | ICD-10-CM | POA: Diagnosis present

## 2024-10-18 DIAGNOSIS — I70221 Atherosclerosis of native arteries of extremities with rest pain, right leg: Secondary | ICD-10-CM

## 2024-10-18 DIAGNOSIS — Z83438 Family history of other disorder of lipoprotein metabolism and other lipidemia: Secondary | ICD-10-CM

## 2024-10-18 DIAGNOSIS — Z7984 Long term (current) use of oral hypoglycemic drugs: Secondary | ICD-10-CM

## 2024-10-18 DIAGNOSIS — Z7902 Long term (current) use of antithrombotics/antiplatelets: Secondary | ICD-10-CM

## 2024-10-18 DIAGNOSIS — Z95828 Presence of other vascular implants and grafts: Secondary | ICD-10-CM | POA: Diagnosis not present

## 2024-10-18 DIAGNOSIS — I70219 Atherosclerosis of native arteries of extremities with intermittent claudication, unspecified extremity: Secondary | ICD-10-CM | POA: Insufficient documentation

## 2024-10-18 DIAGNOSIS — E1165 Type 2 diabetes mellitus with hyperglycemia: Secondary | ICD-10-CM | POA: Diagnosis present

## 2024-10-18 DIAGNOSIS — D72829 Elevated white blood cell count, unspecified: Secondary | ICD-10-CM | POA: Diagnosis present

## 2024-10-18 DIAGNOSIS — I70211 Atherosclerosis of native arteries of extremities with intermittent claudication, right leg: Secondary | ICD-10-CM | POA: Diagnosis present

## 2024-10-18 DIAGNOSIS — Z8249 Family history of ischemic heart disease and other diseases of the circulatory system: Secondary | ICD-10-CM

## 2024-10-18 DIAGNOSIS — Z825 Family history of asthma and other chronic lower respiratory diseases: Secondary | ICD-10-CM

## 2024-10-18 DIAGNOSIS — I70209 Unspecified atherosclerosis of native arteries of extremities, unspecified extremity: Principal | ICD-10-CM

## 2024-10-18 DIAGNOSIS — I739 Peripheral vascular disease, unspecified: Secondary | ICD-10-CM | POA: Diagnosis present

## 2024-10-18 DIAGNOSIS — Z9889 Other specified postprocedural states: Secondary | ICD-10-CM

## 2024-10-18 DIAGNOSIS — K219 Gastro-esophageal reflux disease without esophagitis: Secondary | ICD-10-CM | POA: Diagnosis present

## 2024-10-18 DIAGNOSIS — Z79899 Other long term (current) drug therapy: Secondary | ICD-10-CM

## 2024-10-18 DIAGNOSIS — I252 Old myocardial infarction: Secondary | ICD-10-CM

## 2024-10-18 DIAGNOSIS — F17200 Nicotine dependence, unspecified, uncomplicated: Secondary | ICD-10-CM | POA: Diagnosis present

## 2024-10-18 DIAGNOSIS — E876 Hypokalemia: Secondary | ICD-10-CM | POA: Diagnosis present

## 2024-10-18 DIAGNOSIS — Z87891 Personal history of nicotine dependence: Secondary | ICD-10-CM

## 2024-10-18 DIAGNOSIS — E782 Mixed hyperlipidemia: Secondary | ICD-10-CM | POA: Diagnosis present

## 2024-10-18 DIAGNOSIS — Z833 Family history of diabetes mellitus: Secondary | ICD-10-CM

## 2024-10-18 DIAGNOSIS — Z955 Presence of coronary angioplasty implant and graft: Secondary | ICD-10-CM

## 2024-10-18 DIAGNOSIS — I2510 Atherosclerotic heart disease of native coronary artery without angina pectoris: Secondary | ICD-10-CM | POA: Diagnosis present

## 2024-10-18 DIAGNOSIS — Y838 Other surgical procedures as the cause of abnormal reaction of the patient, or of later complication, without mention of misadventure at the time of the procedure: Secondary | ICD-10-CM | POA: Diagnosis present

## 2024-10-18 LAB — CBC WITH DIFFERENTIAL/PLATELET
Abs Immature Granulocytes: 0.03 K/uL (ref 0.00–0.07)
Basophils Absolute: 0.1 K/uL (ref 0.0–0.1)
Basophils Relative: 1 %
Eosinophils Absolute: 0.4 K/uL (ref 0.0–0.5)
Eosinophils Relative: 4 %
HCT: 36.9 % (ref 36.0–46.0)
Hemoglobin: 12.3 g/dL (ref 12.0–15.0)
Immature Granulocytes: 0 %
Lymphocytes Relative: 31 %
Lymphs Abs: 3.5 K/uL (ref 0.7–4.0)
MCH: 27.3 pg (ref 26.0–34.0)
MCHC: 33.3 g/dL (ref 30.0–36.0)
MCV: 81.8 fL (ref 80.0–100.0)
Monocytes Absolute: 0.7 K/uL (ref 0.1–1.0)
Monocytes Relative: 6 %
Neutro Abs: 6.5 K/uL (ref 1.7–7.7)
Neutrophils Relative %: 58 %
Platelets: 538 K/uL — ABNORMAL HIGH (ref 150–400)
RBC: 4.51 MIL/uL (ref 3.87–5.11)
RDW: 16 % — ABNORMAL HIGH (ref 11.5–15.5)
WBC: 11.2 K/uL — ABNORMAL HIGH (ref 4.0–10.5)
nRBC: 0 % (ref 0.0–0.2)

## 2024-10-18 LAB — PROTIME-INR
INR: 0.9 (ref 0.8–1.2)
Prothrombin Time: 13.2 s (ref 11.4–15.2)

## 2024-10-18 LAB — COMPREHENSIVE METABOLIC PANEL WITH GFR
ALT: 12 U/L (ref 0–44)
AST: 18 U/L (ref 15–41)
Albumin: 4.5 g/dL (ref 3.5–5.0)
Alkaline Phosphatase: 118 U/L (ref 38–126)
Anion gap: 13 (ref 5–15)
BUN: 14 mg/dL (ref 6–20)
CO2: 24 mmol/L (ref 22–32)
Calcium: 9.5 mg/dL (ref 8.9–10.3)
Chloride: 103 mmol/L (ref 98–111)
Creatinine, Ser: 0.64 mg/dL (ref 0.44–1.00)
GFR, Estimated: >60 mL/min
Glucose, Bld: 158 mg/dL — ABNORMAL HIGH (ref 70–99)
Potassium: 3.3 mmol/L — ABNORMAL LOW (ref 3.5–5.1)
Sodium: 141 mmol/L (ref 135–145)
Total Bilirubin: 0.2 mg/dL (ref 0.0–1.2)
Total Protein: 8.1 g/dL (ref 6.5–8.1)

## 2024-10-18 LAB — HEPARIN LEVEL (UNFRACTIONATED): Heparin Unfractionated: 0.4 [IU]/mL (ref 0.30–0.70)

## 2024-10-18 LAB — HIV ANTIBODY (ROUTINE TESTING W REFLEX): HIV Screen 4th Generation wRfx: NONREACTIVE

## 2024-10-18 LAB — APTT: aPTT: 35 s (ref 24–36)

## 2024-10-18 LAB — MAGNESIUM: Magnesium: 2.3 mg/dL (ref 1.7–2.4)

## 2024-10-18 LAB — LACTIC ACID, PLASMA
Lactic Acid, Venous: 1.9 mmol/L (ref 0.5–1.9)
Lactic Acid, Venous: 1.3 mmol/L (ref 0.5–1.9)

## 2024-10-18 MED ORDER — METOPROLOL SUCCINATE ER 50 MG PO TB24
50.0000 mg | ORAL_TABLET | Freq: Every day | ORAL | Status: DC
  Administered 2024-10-19 – 2024-10-20 (×2): 50 mg via ORAL
  Filled 2024-10-18 (×2): qty 1

## 2024-10-18 MED ORDER — PANTOPRAZOLE SODIUM 40 MG PO TBEC
40.0000 mg | DELAYED_RELEASE_TABLET | Freq: Every day | ORAL | Status: DC
  Administered 2024-10-18 – 2024-10-20 (×3): 40 mg via ORAL
  Filled 2024-10-18 (×3): qty 1

## 2024-10-18 MED ORDER — VENLAFAXINE HCL 37.5 MG PO TABS
75.0000 mg | ORAL_TABLET | Freq: Two times a day (BID) | ORAL | Status: DC
  Filled 2024-10-18 (×4): qty 2

## 2024-10-18 MED ORDER — ACETAMINOPHEN 650 MG RE SUPP: 650.0000 mg | Freq: Four times a day (QID) | RECTAL | Status: DC | PRN

## 2024-10-18 MED ORDER — LABETALOL HCL 5 MG/ML IV SOLN
10.0000 mg | INTRAVENOUS | Status: DC | PRN
  Filled 2024-10-18: qty 4

## 2024-10-18 MED ORDER — ROSUVASTATIN CALCIUM 10 MG PO TABS
40.0000 mg | ORAL_TABLET | Freq: Every day | ORAL | Status: DC
  Administered 2024-10-18 – 2024-10-20 (×3): 40 mg via ORAL
  Filled 2024-10-18 (×3): qty 4

## 2024-10-18 MED ORDER — EZETIMIBE 10 MG PO TABS
10.0000 mg | ORAL_TABLET | Freq: Every day | ORAL | Status: DC
  Administered 2024-10-19 – 2024-10-20 (×2): 10 mg via ORAL
  Filled 2024-10-18 (×2): qty 1

## 2024-10-18 MED ORDER — SENNOSIDES-DOCUSATE SODIUM 8.6-50 MG PO TABS: 1.0000 | ORAL_TABLET | Freq: Every evening | ORAL | Status: DC | PRN

## 2024-10-18 MED ORDER — LOSARTAN POTASSIUM 50 MG PO TABS
100.0000 mg | ORAL_TABLET | Freq: Every day | ORAL | Status: DC
  Administered 2024-10-19 – 2024-10-20 (×2): 100 mg via ORAL
  Filled 2024-10-18 (×2): qty 2

## 2024-10-18 MED ORDER — ONDANSETRON HCL 4 MG PO TABS: 4.0000 mg | ORAL_TABLET | Freq: Four times a day (QID) | ORAL | Status: DC | PRN

## 2024-10-18 MED ORDER — HEPARIN BOLUS VIA INFUSION
4000.0000 [IU] | Freq: Once | INTRAVENOUS | Status: AC
  Administered 2024-10-18: 4000 [IU] via INTRAVENOUS
  Filled 2024-10-18: qty 4000

## 2024-10-18 MED ORDER — ONDANSETRON HCL 4 MG/2ML IJ SOLN: 4.0000 mg | Freq: Four times a day (QID) | INTRAMUSCULAR | Status: DC | PRN

## 2024-10-18 MED ORDER — ASPIRIN 81 MG PO TBEC
81.0000 mg | DELAYED_RELEASE_TABLET | Freq: Every day | ORAL | Status: DC
  Administered 2024-10-19 – 2024-10-20 (×2): 81 mg via ORAL
  Filled 2024-10-18 (×2): qty 1

## 2024-10-18 MED ORDER — DAPAGLIFLOZIN PROPANEDIOL 10 MG PO TABS
10.0000 mg | ORAL_TABLET | Freq: Every day | ORAL | Status: DC
  Administered 2024-10-18 – 2024-10-20 (×3): 10 mg via ORAL
  Filled 2024-10-18 (×3): qty 1

## 2024-10-18 MED ORDER — GABAPENTIN 300 MG PO CAPS: 600.0000 mg | ORAL_CAPSULE | Freq: Three times a day (TID) | ORAL | Status: DC | PRN

## 2024-10-18 MED ORDER — HEPARIN (PORCINE) 25000 UT/250ML-% IV SOLN
1150.0000 [IU]/h | INTRAVENOUS | Status: DC
  Administered 2024-10-18: 1150 [IU]/h via INTRAVENOUS
  Filled 2024-10-18: qty 250

## 2024-10-18 MED ORDER — CLONIDINE HCL 0.1 MG PO TABS
0.2000 mg | ORAL_TABLET | Freq: Two times a day (BID) | ORAL | Status: DC
  Administered 2024-10-18 – 2024-10-20 (×4): 0.2 mg via ORAL
  Filled 2024-10-18 (×4): qty 2

## 2024-10-18 MED ORDER — CLOPIDOGREL BISULFATE 75 MG PO TABS: 75.0000 mg | ORAL_TABLET | Freq: Every day | ORAL | Status: DC

## 2024-10-18 MED ORDER — AMLODIPINE BESYLATE 10 MG PO TABS
10.0000 mg | ORAL_TABLET | Freq: Every day | ORAL | Status: DC
  Administered 2024-10-19 – 2024-10-20 (×2): 10 mg via ORAL
  Filled 2024-10-18 (×2): qty 1

## 2024-10-18 MED ORDER — ACETAMINOPHEN 325 MG PO TABS: 650.0000 mg | ORAL_TABLET | Freq: Four times a day (QID) | ORAL | Status: DC | PRN

## 2024-10-18 MED ORDER — SODIUM CHLORIDE 0.9% FLUSH
3.0000 mL | Freq: Two times a day (BID) | INTRAVENOUS | Status: DC
  Administered 2024-10-19 – 2024-10-20 (×2): 3 mL via INTRAVENOUS

## 2024-10-18 NOTE — Telephone Encounter (Signed)
 Error

## 2024-10-18 NOTE — ED Notes (Signed)
 Lab called to collect blood work.

## 2024-10-18 NOTE — H&P (Signed)
 " History and Physical    Heather Mahoney FMW:[id number] DOB: [birth year] DOA: 10/18/2024  DOS: the patient was seen and examined on 10/18/2024  PCP: Knute Thersia Bitters, FNP   Patient coming from: Home  I have personally briefly reviewed patient's old medical records in [facility] and [facility]where  HPI:   Heather Mahoney is a 58 y.o. year old female with medical history of hypertension, hyperlipidemia, type 2 diabetes, peripheral arterial disease status post stent placement presented to the ED with right leg pain.  Patient had stent placed in right lower extremity and 08/2022.  He states he has been having pain in his right lower extremity for about 3 weeks that has been gradually worsening.  Given the symptoms, DVT study was obtained that showed no DVT but concern for stent occlusion.  EDP discussed case with vascular surgery who recommended heparin  drip along with likely angiogram this admission.  Given this, [facility] consulted for admission.  Lab work obtained after admission showed CBC with mild leukocytosis that is chronic and stable along with thrombocytosis that is chronic and stable.  CMP with mild hypokalemia and moderate hyperglycemia otherwise unremarkable.  Magnesium  normal at 2.3.    Review of Systems: As mentioned in the history of present illness. All other systems reviewed and are negative.   Past Medical History:  Diagnosis Date   Anxiety    Arthritis    CAD (coronary artery disease)    a. PCI to proximal and distal RCA 3/26   Cancer [facility])    Mom grandma   Chest pain 04/09/2019   Chronic kidney disease    Mom   Depression 2023   Diabetes mellitus without complication (HCC)    GERD (gastroesophageal reflux disease)    Heart murmur    MR   Hiatal hernia    Hot flashes    Hyperlipidemia    Hypertension    Myocardial infarction (HCC)    Neuropathy    Thyroid  disease    Mom   Tobacco abuse    a. Started at age 50, quit during 3 pregnancies. b. 1 PPD for a  40 pack-year hx (2019)     Past Surgical History:  Procedure Laterality Date   ABDOMINAL AORTIC ANEURYSM REPAIR     2019   BREAST BIOPSY Right    Multiple biopsies-all benign   BREAST SURGERY  1986   I&D for 'milk duct'   COLONOSCOPY WITH PROPOFOL  N/A 07/07/2022   Procedure: COLONOSCOPY WITH PROPOFOL ;  Surgeon: Therisa Bi, MD;  Location: [facility] ENDOSCOPY;  Service: Gastroenterology;  Laterality: N/A;   CORONARY STENT INTERVENTION N/A 01/13/2019   Procedure: CORONARY STENT INTERVENTION;  Surgeon: Mady Bruckner, MD;  Location: [facility] INVASIVE CV LAB;  Service: Cardiovascular;  Laterality: N/A;   EMBOLECTOMY  08/13/2018   Procedure: Right SFA and profunda femoris Fogarty embolectomy 3  Fogarty embolectomy balloon;  Surgeon: Marea Selinda RAMAN, MD;  Location: [facility] ORS;  Service: Vascular;;   ENDARTERECTOMY FEMORAL Right 08/13/2018   Procedure: Right common femoral, profunda femoris, and superficial femoral artery endarterectomies and patch angioplasty ;  Surgeon: Marea Selinda RAMAN, MD;  Location: [facility] ORS;  Service: Vascular;  Laterality: Right;   ENDOVASCULAR STENT GRAFT (AAA) N/A 08/13/2018   Procedure: ENDOVASCULAR REPAIR/STENT GRAFT;  Surgeon: Jama Cordella MATSU, MD;  Location: [facility] INVASIVE CV LAB;  Service: Cardiovascular;  Laterality: N/A;   LEFT HEART CATH AND CORONARY ANGIOGRAPHY Left 01/13/2019   Procedure: LEFT HEART CATH AND CORONARY ANGIOGRAPHY;  Surgeon: Perla Lye  J, MD;  Location: [facility] INVASIVE CV LAB;  Service: Cardiovascular;  Laterality: Left;   LEFT HEART CATH AND CORONARY ANGIOGRAPHY Left 07/24/2023   Procedure: LEFT HEART CATH AND CORONARY ANGIOGRAPHY;  Surgeon: Darron Deatrice LABOR, MD;  Location: [facility] INVASIVE CV LAB;  Service: Cardiovascular;  Laterality: Left;   LOWER EXTREMITY ANGIOGRAPHY Right 09/08/2022   Procedure: Lower Extremity Angiography;  Surgeon: Marea Selinda RAMAN, MD;  Location: [facility] INVASIVE CV LAB;  Service: Cardiovascular;  Laterality: Right;   LOWER EXTREMITY  ANGIOGRAPHY Right 09/15/2022   Procedure: Lower Extremity Angiography;  Surgeon: Marea Selinda RAMAN, MD;  Location: [facility] INVASIVE CV LAB;  Service: Cardiovascular;  Laterality: Right;   SALPINGOOPHORECTOMY Left 2000     Allergies[1]  Family History  Problem Relation Age of Onset   Breast cancer Mother    Diabetes Mother    Hypertension Mother    Hyperlipidemia Mother    Heart disease Mother        CABG X 4   Asthma Mother 67   Sarcoidosis Mother        In remission   Heart attack Mother    Breast cancer Maternal Grandmother 42   Diabetes Maternal Grandmother    Heart disease Maternal Grandmother    Hyperlipidemia Maternal Grandmother     Prior to Admission medications  Medication Sig Start Date End Date Taking? Authorizing Provider  amLODipine  (NORVASC ) 10 MG tablet Take 1 tablet by mouth once daily 06/24/24  Yes Gollan, Timothy J, MD  aspirin  81 MG EC tablet Take 1 tablet (81 mg total) by mouth daily. 01/14/19  Yes Visser, Jacquelyn D, PA-C  cloNIDine  (CATAPRES ) 0.2 MG tablet Take 1 tablet (0.2 mg total) by mouth in the morning and at bedtime. Take an extra 0.1 mg for BP >160 09/02/23  Yes Gollan, Timothy J, MD  clopidogrel  (PLAVIX ) 75 MG tablet Take 1 tablet (75 mg total) by mouth daily. 09/02/23  Yes Gollan, Timothy J, MD  ezetimibe  (ZETIA ) 10 MG tablet Take 1 tablet (10 mg total) by mouth daily. 08/01/24  Yes HiltyVinie BROCKS, MD  FARXIGA  10 MG TABS tablet Take 1 tablet by mouth once daily 05/06/24  Yes Caudle, Alexis Olivia, FNP  gabapentin  (NEURONTIN ) 600 MG tablet Take 1 tablet (600 mg total) by mouth 3 (three) times daily as needed. 09/21/23  Yes Caudle, Thersia Bitters, FNP  losartan  (COZAAR ) 100 MG tablet Take 1 tablet by mouth once daily 06/24/24  Yes Gollan, Timothy J, MD  metoprolol  succinate (TOPROL -XL) 50 MG 24 hr tablet TAKE 1 TABLET BY MOUTH ONCE DAILY.  TAKE WITH OR IMMEDIATELY AFTER A MEAL 06/24/24  Yes Gollan, Timothy J, MD  pantoprazole  (PROTONIX ) 40 MG tablet Take 1 tablet  (40 mg total) by mouth daily. 07/20/23  Yes Gollan, Timothy J, MD  rosuvastatin  (CRESTOR ) 40 MG tablet Take 1 tablet (40 mg total) by mouth daily. 08/01/24 07/27/25 Yes Hilty, Vinie BROCKS, MD  venlafaxine  (EFFEXOR ) 75 MG tablet Take 1 tablet (75 mg total) by mouth 2 (two) times daily with a meal. 09/21/23  Yes Caudle, Thersia Bitters, FNP  Blood Pressure Monitoring (BLOOD PRESSURE CUFF) MISC Check blood pressure as instructed by your physician 05/16/24   Abigail Bernardino HERO, PA-C    Social History:  reports that she quit smoking about 6 years ago. Her smoking use included cigarettes. She started smoking about 44 years ago. She has a 9.5 pack-year smoking history. She has been exposed to tobacco smoke. She has never used smokeless tobacco. She reports  that she does not currently use alcohol. She reports that she does not currently use drugs after having used the following drugs: Marijuana.    Physical Exam: Vitals:   10/18/24 1142 10/18/24 1300 10/18/24 1457 10/18/24 1458  BP: (!) 151/93  (!) 172/101 (!) 164/107  Pulse: 63  75 70  Resp: 17  18   Temp:   98 F (36.7 C)   TempSrc:      SpO2: 97%  100%   Weight:  76.8 kg    Height:  5' 5.98 (1.676 m)      Gen: NAD HENT: NCAT CV: normal heart sounds Lung: CTAB Abd: No TTP, normal bowel sounds MSK: No asymmetry, good bulk and tone Neuro: alert and oriented   Labs on Admission: I have personally reviewed following labs and imaging studies  CBC: Recent Labs  Lab 10/18/24 1326  WBC 11.2*  NEUTROABS 6.5  HGB 12.3  HCT 36.9  MCV 81.8  PLT 538*   Basic Metabolic Panel: Recent Labs  Lab 10/18/24 1326  NA 141  K 3.3*  CL 103  CO2 24  GLUCOSE 158*  BUN 14  CREATININE 0.64  CALCIUM  9.5  MG 2.3   GFR: Estimated Creatinine Clearance: 79.2 mL/min (by C-G formula based on SCr of 0.64 mg/dL). Liver Function Tests: Recent Labs  Lab 10/18/24 1326  AST 18  ALT 12  ALKPHOS 118  BILITOT 0.2  PROT 8.1  ALBUMIN 4.5   No results for  input(s): LIPASE, AMYLASE in the last 168 hours. No results for input(s): AMMONIA in the last 168 hours. Coagulation Profile: Recent Labs  Lab 10/18/24 1326  INR 0.9   Cardiac Enzymes: No results for input(s): CKTOTAL, CKMB, CKMBINDEX, TROPONINI, TROPONINIHS in the last 168 hours. BNP (last 3 results) No results for input(s): BNP in the last 8760 hours. HbA1C: No results for input(s): HGBA1C in the last 72 hours. CBG: No results for input(s): GLUCAP in the last 168 hours. Lipid Profile: No results for input(s): CHOL, HDL, LDLCALC, TRIG, CHOLHDL, LDLDIRECT in the last 72 hours. Thyroid  Function Tests: No results for input(s): TSH, T4TOTAL, FREET4, T3FREE, THYROIDAB in the last 72 hours. Anemia Panel: No results for input(s): VITAMINB12, FOLATE, FERRITIN, TIBC, IRON, RETICCTPCT in the last 72 hours. Urine analysis:    Component Value Date/Time   COLORURINE YELLOW (A) 02/24/2022 1409   APPEARANCEUR CLEAR (A) 02/24/2022 1409   LABSPEC 1.036 (H) 02/24/2022 1409   PHURINE 6.0 02/24/2022 1409   GLUCOSEU NEGATIVE 02/24/2022 1409   HGBUR MODERATE (A) 02/24/2022 1409   BILIRUBINUR NEGATIVE 02/24/2022 1409   KETONESUR NEGATIVE 02/24/2022 1409   PROTEINUR NEGATIVE 02/24/2022 1409   NITRITE NEGATIVE 02/24/2022 1409   LEUKOCYTESUR NEGATIVE 02/24/2022 1409    Radiological Exams on Admission: I have personally reviewed images US  Venous Img Lower Unilateral Right Result Date: 10/18/2024 EXAM: ULTRASOUND DUPLEX OF THE RIGHT LOWER EXTREMITY VEINS TECHNIQUE: Duplex ultrasound using B-mode/gray scaled imaging and Doppler spectral analysis and color flow was obtained of the deep venous structures of the right lower extremity. COMPARISON: 02/19/2023 CLINICAL HISTORY: pain, hx of stent FINDINGS: The common femoral vein, femoral vein, popliteal vein, and posterior tibial vein demonstrate normal compressibility with normal color flow and spectral  analysis. The partially imaged right superficial femoral artery stent is occluded with no arterial flow. The distal superficial femoral artery is patent with dampened, monophasic waveforms. IMPRESSION: 1. No evidence of DVT in the right lower extremity. 2. Occluded partially imaged right superficial femoral artery stent  with distal reconstitution. Recommended dedicated arterial imaging for further evaluation as clinically indicated. Electronically signed by: Michaeline Blanch MD 10/18/2024 11:40 AM EST RP Workstation: [id number]    EKG: My personal interpretation of EKG shows: pending    Assessment/Plan Principal Problem:   Stenosis of peripheral vascular stent, initial encounter Active Problems:   Hypertension   Mixed hyperlipidemia   Tobacco use disorder   PAD (peripheral artery disease)   CAD (coronary artery disease)   Type 2 diabetes mellitus without complication, without long-term current use of insulin (HCC)   Patient with occlusion of superficial femoral artery stent resulting in leg pain.  Vascular surgery consulted and patient is on heparin .  They plan to see in consultation and will likely perform angiogram per EDP's discussion.  Appreciate their involvement in the care of this patient. NPO after midnight.   Hypertension: Continue home medicines.  Use IV antihypertensives as needed.  As needed.  Hyperlipidemia: CAD/PAD: Continue home statin, Zetia , aspirin ..  Holding Plavix  currently given heparin  drip and angiogram.  Type 2 diabetes: Continue home Farxiga  and started on SSI here.   VTE prophylaxis:  IV heparin  gtts  Diet: HH Code Status:  Full Code Telemetry:  Admission status: Observation, Telemetry bed Patient is from: Home Anticipated d/c is to: Home Anticipated d/c is in: 1-2 days   Family Communication: Updated at bedside  Consults called: Vascular surgery   Severity of Illness: The appropriate patient status for this patient is OBSERVATION. Observation status is  judged to be reasonable and necessary in order to provide the required intensity of service to ensure the patient's safety. The patient's presenting symptoms, physical exam findings, and initial radiographic and laboratory data in the context of their medical condition is felt to place them at decreased risk for further clinical deterioration. Furthermore, it is anticipated that the patient will be medically stable for discharge from the hospital within 2 midnights of admission.    Morene Bathe, MD [facility] DEL[facility]      [1]  Allergies Allergen Reactions   Strawberry Extract Hives and Swelling   Strawberry Flavoring Agent (Non-Screening) Rash   "

## 2024-10-18 NOTE — Consult Note (Signed)
 " [facility] Vascular Consult Note  MRN : [id number]  Heather Mahoney is a 58 y.o. (09/14/1966) female who presents with chief complaint of  Chief Complaint  Patient presents with   Leg Pain  .   Consulting Physician:Caitlin Rexford, MD Reason for consult: Right femoral artery occlusion History of Present Illness: The patient is a 58 year old female who is well-known to our vascular service.  She presented to [facility] due to pain in her right lower extremity that has been going on for the last 3 weeks.  She notes that it initially began as some soreness and discomfort with walking but now she can barely walk 1-2 steps without significant pain and discomfort.  She has a previous medical history of endovascular aortic repair in 2019.  She also underwent right femoral endarterectomy in 2019 as well.  She has also had subsequent interventions done on her right lower extremity on 09/15/2022.  We last saw the patient a little over a year ago, as she noted she was moving to [place] .  She currently denies any explicit rest pain like symptoms.  She denies any open wounds or ulcerations.  She underwent a DVT study which found occlusion of her right SFA incidentally.  Current Facility-Administered Medications  Medication Dose Route Frequency Provider Last Rate Last Admin   acetaminophen  (TYLENOL ) tablet 650 mg  650 mg Oral Q6H PRN Fernand Prost, MD       Or   acetaminophen  (TYLENOL ) suppository 650 mg  650 mg Rectal Q6H PRN Khan, Ghalib, MD       [START ON 10/19/2024] amLODipine  (NORVASC ) tablet 10 mg  10 mg Oral Daily Khan, Ghalib, MD       [START ON 10/19/2024] aspirin  EC tablet 81 mg  81 mg Oral Daily Khan, Ghalib, MD       cloNIDine  (CATAPRES ) tablet 0.2 mg  0.2 mg Oral BID Khan, Ghalib, MD       dapagliflozin  propanediol (FARXIGA ) tablet 10 mg  10 mg Oral Daily Khan, Ghalib, MD       [START ON 10/19/2024] ezetimibe  (ZETIA ) tablet 10 mg  10 mg Oral  Daily Fernand Prost, MD       gabapentin  (NEURONTIN ) capsule 600 mg  600 mg Oral TID PRN Fernand Prost, MD       heparin  ADULT infusion 100 units/mL (25000 units/250mL)  1,150 Units/hr Intravenous Continuous Perley Lum DEL, RPH 11.5 mL/hr at 10/18/24 1514 1,150 Units/hr at 10/18/24 1514   [START ON 10/19/2024] losartan  (COZAAR ) tablet 100 mg  100 mg Oral Daily Khan, Ghalib, MD       NOREEN ON 10/19/2024] metoprolol  succinate (TOPROL -XL) 24 hr tablet 50 mg  50 mg Oral Daily Khan, Ghalib, MD       ondansetron  (ZOFRAN ) tablet 4 mg  4 mg Oral Q6H PRN Khan, Ghalib, MD       Or   ondansetron  (ZOFRAN ) injection 4 mg  4 mg Intravenous Q6H PRN Fernand Prost, MD       pantoprazole  (PROTONIX ) EC tablet 40 mg  40 mg Oral Daily Khan, Ghalib, MD       rosuvastatin  (CRESTOR ) tablet 40 mg  40 mg Oral Daily Khan, Ghalib, MD       senna-docusate (Senokot-S) tablet 1 tablet  1 tablet Oral QHS PRN Fernand Prost, MD       sodium chloride  flush (NS) 0.9 % injection 3 mL  3 mL Intravenous Q12H Fernand Prost, MD  venlafaxine  (EFFEXOR ) tablet 75 mg  75 mg Oral BID WC Fernand Prost, MD       Facility-Administered Medications Ordered in Other Encounters  Medication Dose Route Frequency Provider Last Rate Last Admin   inclisiran (LEQVIO ) injection 284 mg  284 mg Subcutaneous Once Hilty, Vinie BROCKS, MD        Past Medical History:  Diagnosis Date   Anxiety    Arthritis    CAD (coronary artery disease)    a. PCI to proximal and distal RCA 3/26   Cancer [facility])    Mom grandma   Chest pain 04/09/2019   Chronic kidney disease    Mom   Depression 2023   Diabetes mellitus without complication (HCC)    GERD (gastroesophageal reflux disease)    Heart murmur    MR   Hiatal hernia    Hot flashes    Hyperlipidemia    Hypertension    Myocardial infarction (HCC)    Neuropathy    Thyroid  disease    Mom   Tobacco abuse    a. Started at age 79, quit during 3 pregnancies. b. 1 PPD for a 40 pack-year hx (2019)     Past  Surgical History:  Procedure Laterality Date   ABDOMINAL AORTIC ANEURYSM REPAIR     2019   BREAST BIOPSY Right    Multiple biopsies-all benign   BREAST SURGERY  1986   I&D for 'milk duct'   COLONOSCOPY WITH PROPOFOL  N/A 07/07/2022   Procedure: COLONOSCOPY WITH PROPOFOL ;  Surgeon: Therisa Bi, MD;  Location: [facility] ENDOSCOPY;  Service: Gastroenterology;  Laterality: N/A;   CORONARY STENT INTERVENTION N/A 01/13/2019   Procedure: CORONARY STENT INTERVENTION;  Surgeon: Mady Bruckner, MD;  Location: [facility] INVASIVE CV LAB;  Service: Cardiovascular;  Laterality: N/A;   EMBOLECTOMY  08/13/2018   Procedure: Right SFA and profunda femoris Fogarty embolectomy 3  Fogarty embolectomy balloon;  Surgeon: Marea Selinda RAMAN, MD;  Location: [facility] ORS;  Service: Vascular;;   ENDARTERECTOMY FEMORAL Right 08/13/2018   Procedure: Right common femoral, profunda femoris, and superficial femoral artery endarterectomies and patch angioplasty ;  Surgeon: Marea Selinda RAMAN, MD;  Location: [facility] ORS;  Service: Vascular;  Laterality: Right;   ENDOVASCULAR STENT GRAFT (AAA) N/A 08/13/2018   Procedure: ENDOVASCULAR REPAIR/STENT GRAFT;  Surgeon: Jama Cordella MATSU, MD;  Location: [facility] INVASIVE CV LAB;  Service: Cardiovascular;  Laterality: N/A;   LEFT HEART CATH AND CORONARY ANGIOGRAPHY Left 01/13/2019   Procedure: LEFT HEART CATH AND CORONARY ANGIOGRAPHY;  Surgeon: Perla Evalene PARAS, MD;  Location: [facility] INVASIVE CV LAB;  Service: Cardiovascular;  Laterality: Left;   LEFT HEART CATH AND CORONARY ANGIOGRAPHY Left 07/24/2023   Procedure: LEFT HEART CATH AND CORONARY ANGIOGRAPHY;  Surgeon: Darron Deatrice LABOR, MD;  Location: [facility] INVASIVE CV LAB;  Service: Cardiovascular;  Laterality: Left;   LOWER EXTREMITY ANGIOGRAPHY Right 09/08/2022   Procedure: Lower Extremity Angiography;  Surgeon: Marea Selinda RAMAN, MD;  Location: [facility] INVASIVE CV LAB;  Service: Cardiovascular;  Laterality: Right;   LOWER EXTREMITY ANGIOGRAPHY Right 09/15/2022    Procedure: Lower Extremity Angiography;  Surgeon: Marea Selinda RAMAN, MD;  Location: [facility] INVASIVE CV LAB;  Service: Cardiovascular;  Laterality: Right;   SALPINGOOPHORECTOMY Left 2000    Social History Social History[1]  Family History Family History  Problem Relation Age of Onset   Breast cancer Mother    Diabetes Mother    Hypertension Mother    Hyperlipidemia Mother    Heart disease Mother  CABG X 4   Asthma Mother 77   Sarcoidosis Mother        In remission   Heart attack Mother    Breast cancer Maternal Grandmother 55   Diabetes Maternal Grandmother    Heart disease Maternal Grandmother    Hyperlipidemia Maternal Grandmother     Allergies[2]   REVIEW OF SYSTEMS (Negative unless checked)  Constitutional: [] Weight loss  [] Fever  [] Chills Cardiac: [] Chest pain   [] Chest pressure   [] Palpitations   [] Shortness of breath when laying flat   [] Shortness of breath at rest   [] Shortness of breath with exertion. Vascular:  [] Pain in legs with walking   [] Pain in legs at rest   [] Pain in legs when laying flat   [] Claudication   [x] Pain in feet when walking  [] Pain in feet at rest  [] Pain in feet when laying flat   [] History of DVT   [] Phlebitis   [] Swelling in legs   [] Varicose veins   [] Non-healing ulcers Pulmonary:   [] Uses home oxygen   [] Productive cough   [] Hemoptysis   [] Wheeze  [] COPD   [] Asthma Neurologic:  [] Dizziness  [] Blackouts   [] Seizures   [] History of stroke   [] History of TIA  [] Aphasia   [] Temporary blindness   [] Dysphagia   [] Weakness or numbness in arms   [] Weakness or numbness in legs Musculoskeletal:  [] Arthritis   [] Joint swelling   [] Joint pain   [] Low back pain Hematologic:  [] Easy bruising  [] Easy bleeding   [] Hypercoagulable state   [] Anemic  [] Hepatitis Gastrointestinal:  [] Blood in stool   [] Vomiting blood  [] Gastroesophageal reflux/heartburn   [] Difficulty swallowing. Genitourinary:  [] Chronic kidney disease   [] Difficult urination  [] Frequent urination   [] Burning with urination   [] Blood in urine Skin:  [] Rashes   [] Ulcers   [] Wounds Psychological:  [] History of anxiety   []  History of major depression.  Physical Examination  Vitals:   10/18/24 1142 10/18/24 1300 10/18/24 1457 10/18/24 1458  BP: (!) 151/93  (!) 172/101 (!) 164/107  Pulse: 63  75 70  Resp: 17  18   Temp:   98 F (36.7 C)   TempSrc:      SpO2: 97%  100%   Weight:  76.8 kg    Height:  5' 5.98 (1.676 m)     Body mass index is 27.34 kg/m. Gen:  WD/WN, NAD Head: [place]/AT, No temporalis wasting. Prominent temp pulse not noted. Ear/Nose/Throat: Hearing grossly intact, nares w/o erythema or drainage, oropharynx w/o Erythema/Exudate Eyes: Sclera non-icteric, conjunctiva clear Neck: Trachea midline.  No JVD.  Pulmonary:  Good air movement, respirations not labored, equal bilaterally.  Cardiac: RRR, normal S1, S2. Vascular:  Vessel Right Left  PT Not Palpable Not Palpable  DP Not Palpable Not Palpable   Gastrointestinal: soft, non-tender/non-distended. No guarding/reflex.  Musculoskeletal: M/S 5/5 throughout.  Extremities without ischemic changes.  No deformity or atrophy. No edema. Neurologic: Sensation grossly intact in extremities.  Symmetrical.  Speech is fluent. Motor exam as listed above. Psychiatric: Judgment intact, Mood & affect appropriate for pt's clinical situation. Dermatologic: No rashes or ulcers noted.  No cellulitis or open wounds. Lymph : No Cervical, Axillary, or Inguinal lymphadenopathy.    CBC Lab Results  Component Value Date   WBC 11.2 (H) 10/18/2024   HGB 12.3 10/18/2024   HCT 36.9 10/18/2024   MCV 81.8 10/18/2024   PLT 538 (H) 10/18/2024    BMET    Component Value Date/Time   NA 141 10/18/2024  1326   NA 141 03/15/2024 1326   K 3.3 (L) 10/18/2024 1326   CL 103 10/18/2024 1326   CO2 24 10/18/2024 1326   GLUCOSE 158 (H) 10/18/2024 1326   BUN 14 10/18/2024 1326   BUN 13 03/15/2024 1326   CREATININE 0.64 10/18/2024 1326   CALCIUM   9.5 10/18/2024 1326   GFRNONAA >60 10/18/2024 1326   GFRAA >60 07/05/2020 0834   Estimated Creatinine Clearance: 79.2 mL/min (by C-G formula based on SCr of 0.64 mg/dL).  COAG Lab Results  Component Value Date   INR 0.9 10/18/2024   INR 1.03 08/13/2018   INR 1.08 01/08/2016    Radiology US  Venous Img Lower Unilateral Right Result Date: 10/18/2024 EXAM: ULTRASOUND DUPLEX OF THE RIGHT LOWER EXTREMITY VEINS TECHNIQUE: Duplex ultrasound using B-mode/gray scaled imaging and Doppler spectral analysis and color flow was obtained of the deep venous structures of the right lower extremity. COMPARISON: 02/19/2023 CLINICAL HISTORY: pain, hx of stent FINDINGS: The common femoral vein, femoral vein, popliteal vein, and posterior tibial vein demonstrate normal compressibility with normal color flow and spectral analysis. The partially imaged right superficial femoral artery stent is occluded with no arterial flow. The distal superficial femoral artery is patent with dampened, monophasic waveforms. IMPRESSION: 1. No evidence of DVT in the right lower extremity. 2. Occluded partially imaged right superficial femoral artery stent with distal reconstitution. Recommended dedicated arterial imaging for further evaluation as clinically indicated. Electronically signed by: Michaeline Blanch MD 10/18/2024 11:40 AM EST RP Workstation: [id number]      Assessment/Plan 1. Superficial Femoral Artery Occlusion  Recommend:  The patient has experienced increased claudication symptoms and is now describing lifestyle limiting claudication and appears to be having mild rest pain symptroms.  Given the severity of the patient's severe right lower extremity symptoms the patient should undergo angiography with the hope for intervention.  Risk and benefits were reviewed the patient.  Indications for the procedure were reviewed.  All questions were answered, the patient agrees to proceed with right lower extremity angiography and  possible intervention.   The patient should continue walking and begin a more formal exercise program.  The patient should continue antiplatelet therapy and aggressive treatment of the lipid abnormalities  The patient will follow up with me after the angiogram.   2. Abdominal Aortic Aneurysm Patient has previous endovascular repair of AAA in 2019. Previous follow up scans showed no evidence of endoleak, we can continue to follow on an outpatient basis   3.  Type 2 diabetes Continue to maintain adequate blood glucose during hospitalization this is important for vascular health.  Plan of care discussed with Dr. Jama  and he is in agreement with plan noted above.   Family Communication:  Total Time:75 I spent 75 minutes in this encounter including personally reviewing extensive medical records, personally reviewing imaging studies and compared to prior scans, counseling the patient, placing orders, coordinating care and performing appropriate documentation  Thank you for allowing us  to participate in the care of this patient.   Nattalie Santiesteban E Bryon Parker, NP [facility] [phone] (Office Phone) [phone] (Office Fax) [phone] (Pager)  10/18/2024 4:38 PM  Staff may message me via secure chat in Epic  but this may not receive immediate response,  please page for urgent matters!  Dictation software was used to generate the above note. Typos may occur and escape review, as with typed/written notes. Any error is purely unintentional.  Please contact me directly for clarity if needed.      [  1]  Social History Tobacco Use   Smoking status: Former    Current packs/day: 0.00    Average packs/day: 0.3 packs/day for 38.0 years (9.5 ttl pk-yrs)    Types: Cigarettes    Start date: 08/10/1980    Quit date: 08/10/2018    Years since quitting: 6.1    Passive exposure: Past   Smokeless tobacco: Never   Tobacco comments:    Quit 08-10-2018  Vaping Use    Vaping status: Never Used  Substance Use Topics   Alcohol use: Not Currently    Comment: occassional,none last 24hrs   Drug use: Not Currently    Types: Marijuana    Comment: Smoke caused severe chest pain stopped immediately  [2]  Allergies Allergen Reactions   Strawberry Extract Hives and Swelling   Strawberry Flavoring Agent (Non-Screening) Rash   "

## 2024-10-18 NOTE — Telephone Encounter (Signed)
 PT called stating she is experiencing pain and circulation issues in her RT calf, stated she had a stent placed

## 2024-10-18 NOTE — H&P (View-Only) (Signed)
 " [facility] Vascular Consult Note  MRN : [id number]  Heather Mahoney is a 58 y.o. (09/14/1966) female who presents with chief complaint of  Chief Complaint  Patient presents with   Leg Pain  .   Consulting Physician:Caitlin Rexford, MD Reason for consult: Right femoral artery occlusion History of Present Illness: The patient is a 58 year old female who is well-known to our vascular service.  She presented to [facility] due to pain in her right lower extremity that has been going on for the last 3 weeks.  She notes that it initially began as some soreness and discomfort with walking but now she can barely walk 1-2 steps without significant pain and discomfort.  She has a previous medical history of endovascular aortic repair in 2019.  She also underwent right femoral endarterectomy in 2019 as well.  She has also had subsequent interventions done on her right lower extremity on 09/15/2022.  We last saw the patient a little over a year ago, as she noted she was moving to [place] .  She currently denies any explicit rest pain like symptoms.  She denies any open wounds or ulcerations.  She underwent a DVT study which found occlusion of her right SFA incidentally.  Current Facility-Administered Medications  Medication Dose Route Frequency Provider Last Rate Last Admin   acetaminophen  (TYLENOL ) tablet 650 mg  650 mg Oral Q6H PRN Fernand Prost, MD       Or   acetaminophen  (TYLENOL ) suppository 650 mg  650 mg Rectal Q6H PRN Khan, Ghalib, MD       [START ON 10/19/2024] amLODipine  (NORVASC ) tablet 10 mg  10 mg Oral Daily Khan, Ghalib, MD       [START ON 10/19/2024] aspirin  EC tablet 81 mg  81 mg Oral Daily Khan, Ghalib, MD       cloNIDine  (CATAPRES ) tablet 0.2 mg  0.2 mg Oral BID Khan, Ghalib, MD       dapagliflozin  propanediol (FARXIGA ) tablet 10 mg  10 mg Oral Daily Khan, Ghalib, MD       [START ON 10/19/2024] ezetimibe  (ZETIA ) tablet 10 mg  10 mg Oral  Daily Fernand Prost, MD       gabapentin  (NEURONTIN ) capsule 600 mg  600 mg Oral TID PRN Fernand Prost, MD       heparin  ADULT infusion 100 units/mL (25000 units/250mL)  1,150 Units/hr Intravenous Continuous Perley Lum DEL, RPH 11.5 mL/hr at 10/18/24 1514 1,150 Units/hr at 10/18/24 1514   [START ON 10/19/2024] losartan  (COZAAR ) tablet 100 mg  100 mg Oral Daily Khan, Ghalib, MD       NOREEN ON 10/19/2024] metoprolol  succinate (TOPROL -XL) 24 hr tablet 50 mg  50 mg Oral Daily Khan, Ghalib, MD       ondansetron  (ZOFRAN ) tablet 4 mg  4 mg Oral Q6H PRN Khan, Ghalib, MD       Or   ondansetron  (ZOFRAN ) injection 4 mg  4 mg Intravenous Q6H PRN Fernand Prost, MD       pantoprazole  (PROTONIX ) EC tablet 40 mg  40 mg Oral Daily Khan, Ghalib, MD       rosuvastatin  (CRESTOR ) tablet 40 mg  40 mg Oral Daily Khan, Ghalib, MD       senna-docusate (Senokot-S) tablet 1 tablet  1 tablet Oral QHS PRN Fernand Prost, MD       sodium chloride  flush (NS) 0.9 % injection 3 mL  3 mL Intravenous Q12H Fernand Prost, MD  venlafaxine  (EFFEXOR ) tablet 75 mg  75 mg Oral BID WC Fernand Prost, MD       Facility-Administered Medications Ordered in Other Encounters  Medication Dose Route Frequency Provider Last Rate Last Admin   inclisiran (LEQVIO ) injection 284 mg  284 mg Subcutaneous Once Hilty, Vinie BROCKS, MD        Past Medical History:  Diagnosis Date   Anxiety    Arthritis    CAD (coronary artery disease)    a. PCI to proximal and distal RCA 3/26   Cancer [facility])    Mom grandma   Chest pain 04/09/2019   Chronic kidney disease    Mom   Depression 2023   Diabetes mellitus without complication (HCC)    GERD (gastroesophageal reflux disease)    Heart murmur    MR   Hiatal hernia    Hot flashes    Hyperlipidemia    Hypertension    Myocardial infarction (HCC)    Neuropathy    Thyroid  disease    Mom   Tobacco abuse    a. Started at age 79, quit during 3 pregnancies. b. 1 PPD for a 40 pack-year hx (2019)     Past  Surgical History:  Procedure Laterality Date   ABDOMINAL AORTIC ANEURYSM REPAIR     2019   BREAST BIOPSY Right    Multiple biopsies-all benign   BREAST SURGERY  1986   I&D for 'milk duct'   COLONOSCOPY WITH PROPOFOL  N/A 07/07/2022   Procedure: COLONOSCOPY WITH PROPOFOL ;  Surgeon: Therisa Bi, MD;  Location: [facility] ENDOSCOPY;  Service: Gastroenterology;  Laterality: N/A;   CORONARY STENT INTERVENTION N/A 01/13/2019   Procedure: CORONARY STENT INTERVENTION;  Surgeon: Mady Bruckner, MD;  Location: [facility] INVASIVE CV LAB;  Service: Cardiovascular;  Laterality: N/A;   EMBOLECTOMY  08/13/2018   Procedure: Right SFA and profunda femoris Fogarty embolectomy 3  Fogarty embolectomy balloon;  Surgeon: Marea Selinda RAMAN, MD;  Location: [facility] ORS;  Service: Vascular;;   ENDARTERECTOMY FEMORAL Right 08/13/2018   Procedure: Right common femoral, profunda femoris, and superficial femoral artery endarterectomies and patch angioplasty ;  Surgeon: Marea Selinda RAMAN, MD;  Location: [facility] ORS;  Service: Vascular;  Laterality: Right;   ENDOVASCULAR STENT GRAFT (AAA) N/A 08/13/2018   Procedure: ENDOVASCULAR REPAIR/STENT GRAFT;  Surgeon: Jama Cordella MATSU, MD;  Location: [facility] INVASIVE CV LAB;  Service: Cardiovascular;  Laterality: N/A;   LEFT HEART CATH AND CORONARY ANGIOGRAPHY Left 01/13/2019   Procedure: LEFT HEART CATH AND CORONARY ANGIOGRAPHY;  Surgeon: Perla Evalene PARAS, MD;  Location: [facility] INVASIVE CV LAB;  Service: Cardiovascular;  Laterality: Left;   LEFT HEART CATH AND CORONARY ANGIOGRAPHY Left 07/24/2023   Procedure: LEFT HEART CATH AND CORONARY ANGIOGRAPHY;  Surgeon: Darron Deatrice LABOR, MD;  Location: [facility] INVASIVE CV LAB;  Service: Cardiovascular;  Laterality: Left;   LOWER EXTREMITY ANGIOGRAPHY Right 09/08/2022   Procedure: Lower Extremity Angiography;  Surgeon: Marea Selinda RAMAN, MD;  Location: [facility] INVASIVE CV LAB;  Service: Cardiovascular;  Laterality: Right;   LOWER EXTREMITY ANGIOGRAPHY Right 09/15/2022    Procedure: Lower Extremity Angiography;  Surgeon: Marea Selinda RAMAN, MD;  Location: [facility] INVASIVE CV LAB;  Service: Cardiovascular;  Laterality: Right;   SALPINGOOPHORECTOMY Left 2000    Social History Social History[1]  Family History Family History  Problem Relation Age of Onset   Breast cancer Mother    Diabetes Mother    Hypertension Mother    Hyperlipidemia Mother    Heart disease Mother  CABG X 4   Asthma Mother 77   Sarcoidosis Mother        In remission   Heart attack Mother    Breast cancer Maternal Grandmother 55   Diabetes Maternal Grandmother    Heart disease Maternal Grandmother    Hyperlipidemia Maternal Grandmother     Allergies[2]   REVIEW OF SYSTEMS (Negative unless checked)  Constitutional: [] Weight loss  [] Fever  [] Chills Cardiac: [] Chest pain   [] Chest pressure   [] Palpitations   [] Shortness of breath when laying flat   [] Shortness of breath at rest   [] Shortness of breath with exertion. Vascular:  [] Pain in legs with walking   [] Pain in legs at rest   [] Pain in legs when laying flat   [] Claudication   [x] Pain in feet when walking  [] Pain in feet at rest  [] Pain in feet when laying flat   [] History of DVT   [] Phlebitis   [] Swelling in legs   [] Varicose veins   [] Non-healing ulcers Pulmonary:   [] Uses home oxygen   [] Productive cough   [] Hemoptysis   [] Wheeze  [] COPD   [] Asthma Neurologic:  [] Dizziness  [] Blackouts   [] Seizures   [] History of stroke   [] History of TIA  [] Aphasia   [] Temporary blindness   [] Dysphagia   [] Weakness or numbness in arms   [] Weakness or numbness in legs Musculoskeletal:  [] Arthritis   [] Joint swelling   [] Joint pain   [] Low back pain Hematologic:  [] Easy bruising  [] Easy bleeding   [] Hypercoagulable state   [] Anemic  [] Hepatitis Gastrointestinal:  [] Blood in stool   [] Vomiting blood  [] Gastroesophageal reflux/heartburn   [] Difficulty swallowing. Genitourinary:  [] Chronic kidney disease   [] Difficult urination  [] Frequent urination   [] Burning with urination   [] Blood in urine Skin:  [] Rashes   [] Ulcers   [] Wounds Psychological:  [] History of anxiety   []  History of major depression.  Physical Examination  Vitals:   10/18/24 1142 10/18/24 1300 10/18/24 1457 10/18/24 1458  BP: (!) 151/93  (!) 172/101 (!) 164/107  Pulse: 63  75 70  Resp: 17  18   Temp:   98 F (36.7 C)   TempSrc:      SpO2: 97%  100%   Weight:  76.8 kg    Height:  5' 5.98 (1.676 m)     Body mass index is 27.34 kg/m. Gen:  WD/WN, NAD Head: [place]/AT, No temporalis wasting. Prominent temp pulse not noted. Ear/Nose/Throat: Hearing grossly intact, nares w/o erythema or drainage, oropharynx w/o Erythema/Exudate Eyes: Sclera non-icteric, conjunctiva clear Neck: Trachea midline.  No JVD.  Pulmonary:  Good air movement, respirations not labored, equal bilaterally.  Cardiac: RRR, normal S1, S2. Vascular:  Vessel Right Left  PT Not Palpable Not Palpable  DP Not Palpable Not Palpable   Gastrointestinal: soft, non-tender/non-distended. No guarding/reflex.  Musculoskeletal: M/S 5/5 throughout.  Extremities without ischemic changes.  No deformity or atrophy. No edema. Neurologic: Sensation grossly intact in extremities.  Symmetrical.  Speech is fluent. Motor exam as listed above. Psychiatric: Judgment intact, Mood & affect appropriate for pt's clinical situation. Dermatologic: No rashes or ulcers noted.  No cellulitis or open wounds. Lymph : No Cervical, Axillary, or Inguinal lymphadenopathy.    CBC Lab Results  Component Value Date   WBC 11.2 (H) 10/18/2024   HGB 12.3 10/18/2024   HCT 36.9 10/18/2024   MCV 81.8 10/18/2024   PLT 538 (H) 10/18/2024    BMET    Component Value Date/Time   NA 141 10/18/2024  1326   NA 141 03/15/2024 1326   K 3.3 (L) 10/18/2024 1326   CL 103 10/18/2024 1326   CO2 24 10/18/2024 1326   GLUCOSE 158 (H) 10/18/2024 1326   BUN 14 10/18/2024 1326   BUN 13 03/15/2024 1326   CREATININE 0.64 10/18/2024 1326   CALCIUM   9.5 10/18/2024 1326   GFRNONAA >60 10/18/2024 1326   GFRAA >60 07/05/2020 0834   Estimated Creatinine Clearance: 79.2 mL/min (by C-G formula based on SCr of 0.64 mg/dL).  COAG Lab Results  Component Value Date   INR 0.9 10/18/2024   INR 1.03 08/13/2018   INR 1.08 01/08/2016    Radiology US  Venous Img Lower Unilateral Right Result Date: 10/18/2024 EXAM: ULTRASOUND DUPLEX OF THE RIGHT LOWER EXTREMITY VEINS TECHNIQUE: Duplex ultrasound using B-mode/gray scaled imaging and Doppler spectral analysis and color flow was obtained of the deep venous structures of the right lower extremity. COMPARISON: 02/19/2023 CLINICAL HISTORY: pain, hx of stent FINDINGS: The common femoral vein, femoral vein, popliteal vein, and posterior tibial vein demonstrate normal compressibility with normal color flow and spectral analysis. The partially imaged right superficial femoral artery stent is occluded with no arterial flow. The distal superficial femoral artery is patent with dampened, monophasic waveforms. IMPRESSION: 1. No evidence of DVT in the right lower extremity. 2. Occluded partially imaged right superficial femoral artery stent with distal reconstitution. Recommended dedicated arterial imaging for further evaluation as clinically indicated. Electronically signed by: Michaeline Blanch MD 10/18/2024 11:40 AM EST RP Workstation: [id number]      Assessment/Plan 1. Superficial Femoral Artery Occlusion  Recommend:  The patient has experienced increased claudication symptoms and is now describing lifestyle limiting claudication and appears to be having mild rest pain symptroms.  Given the severity of the patient's severe right lower extremity symptoms the patient should undergo angiography with the hope for intervention.  Risk and benefits were reviewed the patient.  Indications for the procedure were reviewed.  All questions were answered, the patient agrees to proceed with right lower extremity angiography and  possible intervention.   The patient should continue walking and begin a more formal exercise program.  The patient should continue antiplatelet therapy and aggressive treatment of the lipid abnormalities  The patient will follow up with me after the angiogram.   2. Abdominal Aortic Aneurysm Patient has previous endovascular repair of AAA in 2019. Previous follow up scans showed no evidence of endoleak, we can continue to follow on an outpatient basis   3.  Type 2 diabetes Continue to maintain adequate blood glucose during hospitalization this is important for vascular health.  Plan of care discussed with Dr. Jama  and he is in agreement with plan noted above.   Family Communication:  Total Time:75 I spent 75 minutes in this encounter including personally reviewing extensive medical records, personally reviewing imaging studies and compared to prior scans, counseling the patient, placing orders, coordinating care and performing appropriate documentation  Thank you for allowing us  to participate in the care of this patient.   Nattalie Santiesteban E Bryon Parker, NP [facility] [phone] (Office Phone) [phone] (Office Fax) [phone] (Pager)  10/18/2024 4:38 PM  Staff may message me via secure chat in Epic  but this may not receive immediate response,  please page for urgent matters!  Dictation software was used to generate the above note. Typos may occur and escape review, as with typed/written notes. Any error is purely unintentional.  Please contact me directly for clarity if needed.      [  1]  Social History Tobacco Use   Smoking status: Former    Current packs/day: 0.00    Average packs/day: 0.3 packs/day for 38.0 years (9.5 ttl pk-yrs)    Types: Cigarettes    Start date: 08/10/1980    Quit date: 08/10/2018    Years since quitting: 6.1    Passive exposure: Past   Smokeless tobacco: Never   Tobacco comments:    Quit 08-10-2018  Vaping Use    Vaping status: Never Used  Substance Use Topics   Alcohol use: Not Currently    Comment: occassional,none last 24hrs   Drug use: Not Currently    Types: Marijuana    Comment: Smoke caused severe chest pain stopped immediately  [2]  Allergies Allergen Reactions   Strawberry Extract Hives and Swelling   Strawberry Flavoring Agent (Non-Screening) Rash   "

## 2024-10-18 NOTE — Telephone Encounter (Signed)
 Called patient back regarding pain and circulation issues in R leg. Patient had vascular surgery to get stent placed in R leg in 2024. For the last 3 weeks patient has been experiencing right leg pain from below the knee all the way down to the calf and it is becoming increasingly more painful. She denies swelling in the calf but reports some swelling in her right foot and it is warm to the touch. After hearing symptoms, I recommend patient go to the emergency room to get evaluated for potential blood clot in right calf. Patient states its better to be safe than sorry and wants to go and get it checked out. Patient getting her things together and heading to ED for evaluation. Will send over to her primary cardiologist for FYI.

## 2024-10-18 NOTE — ED Triage Notes (Signed)
 C/O right calf pain x 3 weeks. History of stent to right leg placed November 2023

## 2024-10-18 NOTE — ED Provider Notes (Signed)
 "  [facility] Provider Note    Event Date/Time   First MD Initiated Contact with Patient 10/18/24 1103     (approximate)   History   Leg Pain   HPI  Heather Mahoney is a 58 y.o. female with a past medical history of AAA, hypertension, diabetes, peripheral artery disease, presenting to the emergency department complaining of 3 weeks of right calf pain.  Patient reports that she has a history of a stent placed in that leg in November 2023.  She denies any recent surgeries or recent travel.  She reports that she does have some chest pain when she moves a certain way for the last week and a half but that she can reproduce it by pushing on a certain spot of her chest and thinks it is musculoskeletal.     Physical Exam   Triage Vital Signs: ED Triage Vitals  Encounter Vitals Group     BP 10/18/24 1046 (!) 224/107     Girls Systolic BP Percentile --      Girls Diastolic BP Percentile --      Boys Systolic BP Percentile --      Boys Diastolic BP Percentile --      Pulse Rate 10/18/24 1044 90     Resp 10/18/24 1044 16     Temp 10/18/24 1044 98.2 F (36.8 C)     Temp Source 10/18/24 1044 Oral     SpO2 10/18/24 1044 100 %     Weight 10/18/24 1045 169 lb 5 oz (76.8 kg)     Height --      Head Circumference --      Peak Flow --      Pain Score 10/18/24 1044 8     Pain Loc --      Pain Education --      Exclude from Growth Chart --     Most recent vital signs: Vitals:   10/18/24 1044 10/18/24 1046  BP:  (!) 224/107  Pulse: 90   Resp: 16   Temp: 98.2 F (36.8 C)   SpO2: 100%      General: Awake, no distress.  CV:  Good peripheral perfusion.  Resp:  Normal effort.  Abd:  No distention.  Other:  Point tenderness to palpation of the left lower chest wall   ED Results / Procedures / Treatments   Labs (all labs ordered are listed, but only abnormal results are displayed) Labs Reviewed - No data to display   EKG     RADIOLOGY Right lower  extremity venous ultrasound: IMPRESSION: 1. No evidence of DVT in the right lower extremity. 2. Occluded partially imaged right superficial femoral artery stent with distal reconstitution. Recommended dedicated arterial imaging for further evaluation as clinically indicated.    PROCEDURES:  Critical Care performed: No  Procedures   MEDICATIONS ORDERED IN ED: Medications - No data to display   IMPRESSION / MDM / ASSESSMENT AND PLAN / ED COURSE  I reviewed the triage vital signs and the nursing notes.                              Differential diagnosis includes, but is not limited to, DVT, worsening peripheral vascular disease, muscle spasm/cramp, muscle strain  Patient's presentation is most consistent with acute presentation with potential threat to life or bodily function.  Patient is a 58 year old female with a past medical history of hypertension, AAA,  peripheral artery disease status post stent placement to the right lower extremity in November 2023, presenting to the emergency department complaining of right calf pain x 3 weeks.  Venous ultrasound was performed which did not show any evidence of DVT however there was findings concerning for occlusion of her superficial femoral artery stent.  This finding was discussed with vascular surgery who recommended admission for the patient and starting her on a heparin  drip.  Patient may also follow-up outpatient if this is what she would prefer.  I discussed options with the patient who stated that she would prefer inpatient treatment.  Heparin  ordered as well as lab work.  Vascular surgery updated as to the patient's choice to stay.  I discussed the patient's case with the hospitalist who is in agreement with plan for admission at this time.      FINAL CLINICAL IMPRESSION(S) / ED DIAGNOSES   Final diagnoses:  Superficial femoral artery occlusion     Rx / DC Orders   ED Discharge Orders     None        Note:   This document was prepared using Dragon voice recognition software and may include unintentional dictation errors.   Rexford Reche HERO, MD 10/18/24 1333  "

## 2024-10-18 NOTE — Plan of Care (Signed)
" °  Problem: Clinical Measurements: Goal: Ability to maintain clinical measurements within normal limits will improve Outcome: Progressing Goal: Respiratory complications will improve Outcome: Progressing Goal: Cardiovascular complication will be avoided Outcome: Progressing   Problem: Activity: Goal: Risk for activity intolerance will decrease Outcome: Progressing   Problem: Nutrition: Goal: Adequate nutrition will be maintained Outcome: Progressing   Problem: Pain Managment: Goal: General experience of comfort will improve and/or be controlled Outcome: Progressing   Problem: Safety: Goal: Ability to remain free from injury will improve Outcome: Progressing   "

## 2024-10-18 NOTE — Progress Notes (Signed)
 PHARMACY - ANTICOAGULATION CONSULT NOTE  Pharmacy Consult for Heparin  Infusion Indication: Limb ischemia, superficial femoral artery stent occlusion  Allergies[1]  Patient Measurements: Height: 5' 5.98 (167.6 cm) Weight: 76.8 kg (169 lb 5 oz) IBW/kg (Calculated) : 59.26 HEPARIN  DW (KG): 74.9  Vital Signs: Temp: 98.2 F (36.8 C) (12/30 1044) Temp Source: Oral (12/30 1044) BP: 151/93 (12/30 1142) Pulse Rate: 63 (12/30 1142)  Labs: Recent Labs    10/18/24 1326  HGB 12.3  HCT 36.9  PLT 538*  APTT 35  LABPROT 13.2  INR 0.9    CrCl cannot be calculated (Patient's most recent lab result is older than the maximum 21 days allowed.).   Medical History: Past Medical History:  Diagnosis Date   Anxiety    Arthritis    CAD (coronary artery disease)    a. PCI to proximal and distal RCA 3/26   Cancer [facility])    Mom grandma   Chest pain 04/09/2019   Chronic kidney disease    Mom   Depression 2023   Diabetes mellitus without complication (HCC)    GERD (gastroesophageal reflux disease)    Heart murmur    MR   Hiatal hernia    Hot flashes    Hyperlipidemia    Hypertension    Myocardial infarction (HCC)    Neuropathy    Thyroid  disease    Mom   Tobacco abuse    a. Started at age 5, quit during 3 pregnancies. b. 1 PPD for a 40 pack-year hx (2019)     Medications:  Not on anticoagulation per chart review  Assessment: Patient is a 58 year old female with a past medical history of AAA, hypertension, diabetes, peripheral artery disease, presenting to the ED complaining of 3 weeks of right calf pain. Pharmacy has been consulted to initiate patient on a heparin  infusion for limb ischemia and superficial femoral artery stent occlusion.  Baseline labs: Hgb 12.3 PLT 538 INR 0.9 aPTT 35  No signs/symptoms of bleeding noted in chart.  Goal of Therapy:  Heparin  level 0.3-0.7 units/ml Monitor platelets by anticoagulation protocol: Yes   Plan:  Give 4000 unit bolus x  1 Start heparin  infusion at a rate of 1150 units/hr Check heparin  level in 6 hours Monitor CBC daily while on heparin   [name], PharmD, BCPS 10/18/2024,2:32 PM      [1]  Allergies Allergen Reactions   Strawberry Extract Hives and Swelling   Strawberry Flavoring Agent (Non-Screening) Rash

## 2024-10-19 ENCOUNTER — Encounter: Payer: Self-pay | Admitting: Vascular Surgery

## 2024-10-19 ENCOUNTER — Encounter
Admission: EM | Disposition: A | Discharge status: Home / Self Care | Payer: Self-pay | Source: Ambulatory Visit | Attending: Internal Medicine

## 2024-10-19 DIAGNOSIS — E876 Hypokalemia: Secondary | ICD-10-CM | POA: Diagnosis present

## 2024-10-19 DIAGNOSIS — I70211 Atherosclerosis of native arteries of extremities with intermittent claudication, right leg: Secondary | ICD-10-CM

## 2024-10-19 DIAGNOSIS — Z7984 Long term (current) use of oral hypoglycemic drugs: Secondary | ICD-10-CM | POA: Diagnosis not present

## 2024-10-19 DIAGNOSIS — I70209 Unspecified atherosclerosis of native arteries of extremities, unspecified extremity: Secondary | ICD-10-CM | POA: Diagnosis present

## 2024-10-19 DIAGNOSIS — Z803 Family history of malignant neoplasm of breast: Secondary | ICD-10-CM | POA: Diagnosis not present

## 2024-10-19 DIAGNOSIS — Z9102 Food additives allergy status: Secondary | ICD-10-CM | POA: Diagnosis not present

## 2024-10-19 DIAGNOSIS — E1151 Type 2 diabetes mellitus with diabetic peripheral angiopathy without gangrene: Secondary | ICD-10-CM | POA: Diagnosis present

## 2024-10-19 DIAGNOSIS — Z833 Family history of diabetes mellitus: Secondary | ICD-10-CM | POA: Diagnosis not present

## 2024-10-19 DIAGNOSIS — Y838 Other surgical procedures as the cause of abnormal reaction of the patient, or of later complication, without mention of misadventure at the time of the procedure: Secondary | ICD-10-CM | POA: Diagnosis present

## 2024-10-19 DIAGNOSIS — I2510 Atherosclerotic heart disease of native coronary artery without angina pectoris: Secondary | ICD-10-CM | POA: Diagnosis present

## 2024-10-19 DIAGNOSIS — T82856A Stenosis of peripheral vascular stent, initial encounter: Secondary | ICD-10-CM | POA: Diagnosis present

## 2024-10-19 DIAGNOSIS — Z79899 Other long term (current) drug therapy: Secondary | ICD-10-CM | POA: Diagnosis not present

## 2024-10-19 DIAGNOSIS — Z7982 Long term (current) use of aspirin: Secondary | ICD-10-CM | POA: Diagnosis not present

## 2024-10-19 DIAGNOSIS — I252 Old myocardial infarction: Secondary | ICD-10-CM | POA: Diagnosis not present

## 2024-10-19 DIAGNOSIS — K219 Gastro-esophageal reflux disease without esophagitis: Secondary | ICD-10-CM | POA: Diagnosis present

## 2024-10-19 DIAGNOSIS — I10 Essential (primary) hypertension: Secondary | ICD-10-CM | POA: Diagnosis present

## 2024-10-19 DIAGNOSIS — I714 Abdominal aortic aneurysm, without rupture, unspecified: Secondary | ICD-10-CM | POA: Diagnosis present

## 2024-10-19 DIAGNOSIS — E1165 Type 2 diabetes mellitus with hyperglycemia: Secondary | ICD-10-CM | POA: Diagnosis present

## 2024-10-19 DIAGNOSIS — Z7902 Long term (current) use of antithrombotics/antiplatelets: Secondary | ICD-10-CM | POA: Diagnosis not present

## 2024-10-19 DIAGNOSIS — Z8249 Family history of ischemic heart disease and other diseases of the circulatory system: Secondary | ICD-10-CM | POA: Diagnosis not present

## 2024-10-19 DIAGNOSIS — F32A Depression, unspecified: Secondary | ICD-10-CM | POA: Diagnosis present

## 2024-10-19 DIAGNOSIS — D75839 Thrombocytosis, unspecified: Secondary | ICD-10-CM | POA: Diagnosis present

## 2024-10-19 DIAGNOSIS — Z955 Presence of coronary angioplasty implant and graft: Secondary | ICD-10-CM | POA: Diagnosis not present

## 2024-10-19 DIAGNOSIS — D72829 Elevated white blood cell count, unspecified: Secondary | ICD-10-CM | POA: Diagnosis present

## 2024-10-19 DIAGNOSIS — Z7901 Long term (current) use of anticoagulants: Secondary | ICD-10-CM | POA: Diagnosis not present

## 2024-10-19 DIAGNOSIS — E782 Mixed hyperlipidemia: Secondary | ICD-10-CM | POA: Diagnosis present

## 2024-10-19 HISTORY — PX: LOWER EXTREMITY ANGIOGRAPHY: CATH118251

## 2024-10-19 LAB — BASIC METABOLIC PANEL WITH GFR
Anion gap: 13 (ref 5–15)
BUN: 10 mg/dL (ref 6–20)
CO2: 23 mmol/L (ref 22–32)
Calcium: 9.2 mg/dL (ref 8.9–10.3)
Chloride: 105 mmol/L (ref 98–111)
Creatinine, Ser: 0.66 mg/dL (ref 0.44–1.00)
GFR, Estimated: >60 mL/min
Glucose, Bld: 119 mg/dL — ABNORMAL HIGH (ref 70–99)
Potassium: 3.9 mmol/L (ref 3.5–5.1)
Sodium: 141 mmol/L (ref 135–145)

## 2024-10-19 LAB — GLUCOSE, CAPILLARY
Glucose-Capillary: 119 mg/dL — ABNORMAL HIGH (ref 70–99)
Glucose-Capillary: 123 mg/dL — ABNORMAL HIGH (ref 70–99)
Glucose-Capillary: 108 mg/dL — ABNORMAL HIGH (ref 70–99)
Glucose-Capillary: 111 mg/dL — ABNORMAL HIGH (ref 70–99)

## 2024-10-19 LAB — CBC
HCT: 35.8 % — ABNORMAL LOW (ref 36.0–46.0)
Hemoglobin: 11.8 g/dL — ABNORMAL LOW (ref 12.0–15.0)
MCH: 27 pg (ref 26.0–34.0)
MCHC: 33 g/dL (ref 30.0–36.0)
MCV: 81.9 fL (ref 80.0–100.0)
Platelets: 563 K/uL — ABNORMAL HIGH (ref 150–400)
RBC: 4.37 MIL/uL (ref 3.87–5.11)
RDW: 15.9 % — ABNORMAL HIGH (ref 11.5–15.5)
WBC: 12.8 K/uL — ABNORMAL HIGH (ref 4.0–10.5)
nRBC: 0 % (ref 0.0–0.2)

## 2024-10-19 LAB — HEPARIN LEVEL (UNFRACTIONATED): Heparin Unfractionated: 0.46 [IU]/mL (ref 0.30–0.70)

## 2024-10-19 LAB — HEMOGLOBIN A1C
Hgb A1c MFr Bld: 6.2 % — ABNORMAL HIGH (ref 4.8–5.6)
Mean Plasma Glucose: 131.24 mg/dL

## 2024-10-19 SURGERY — LOWER EXTREMITY ANGIOGRAPHY
Anesthesia: Moderate Sedation | Laterality: Right

## 2024-10-19 MED ORDER — IODIXANOL 320 MG/ML IV SOLN
INTRAVENOUS | Status: DC | PRN
  Administered 2024-10-19: 55 mL

## 2024-10-19 MED ORDER — MIDAZOLAM HCL 2 MG/ML PO SYRP
8.0000 mg | ORAL_SOLUTION | Freq: Once | ORAL | Status: DC | PRN
  Filled 2024-10-19: qty 5

## 2024-10-19 MED ORDER — DIPHENHYDRAMINE HCL 50 MG/ML IJ SOLN: 50.0000 mg | Freq: Once | INTRAMUSCULAR | Status: DC | PRN

## 2024-10-19 MED ORDER — NITROGLYCERIN 1 MG/10 ML FOR IR/CATH LAB
INTRA_ARTERIAL | Status: DC | PRN
  Administered 2024-10-19: 400 ug via INTRA_ARTERIAL

## 2024-10-19 MED ORDER — FENTANYL CITRATE (PF) 100 MCG/2ML IJ SOLN
INTRAMUSCULAR | Status: DC | PRN
  Administered 2024-10-19: 25 ug via INTRAVENOUS
  Administered 2024-10-19: 50 ug via INTRAVENOUS
  Administered 2024-10-19 (×2): 12.5 ug via INTRAVENOUS

## 2024-10-19 MED ORDER — HEPARIN SODIUM (PORCINE) 1000 UNIT/ML IJ SOLN
INTRAMUSCULAR | Status: AC
  Filled 2024-10-19: qty 10

## 2024-10-19 MED ORDER — DIPHENHYDRAMINE HCL 50 MG/ML IJ SOLN
INTRAMUSCULAR | Status: AC
  Filled 2024-10-19: qty 1

## 2024-10-19 MED ORDER — HEPARIN SODIUM (PORCINE) 1000 UNIT/ML IJ SOLN
INTRAMUSCULAR | Status: DC | PRN
  Administered 2024-10-19: 4000 [IU] via INTRAVENOUS

## 2024-10-19 MED ORDER — MIDAZOLAM HCL (PF) 2 MG/2ML IJ SOLN
INTRAMUSCULAR | Status: DC | PRN
  Administered 2024-10-19: 1 mg via INTRAVENOUS
  Administered 2024-10-19: 2 mg via INTRAVENOUS
  Administered 2024-10-19: 1 mg via INTRAVENOUS

## 2024-10-19 MED ORDER — INSULIN ASPART 100 UNIT/ML IJ SOLN: 0.0000 [IU] | Freq: Every day | INTRAMUSCULAR | Status: DC

## 2024-10-19 MED ORDER — METHYLPREDNISOLONE SODIUM SUCC 125 MG IJ SOLR: 125.0000 mg | Freq: Once | INTRAMUSCULAR | Status: DC | PRN

## 2024-10-19 MED ORDER — NITROGLYCERIN 1 MG/10 ML FOR IR/CATH LAB
INTRA_ARTERIAL | Status: AC
  Filled 2024-10-19: qty 10

## 2024-10-19 MED ORDER — CEFAZOLIN SODIUM-DEXTROSE 2-4 GM/100ML-% IV SOLN
INTRAVENOUS | Status: AC
  Filled 2024-10-19: qty 100

## 2024-10-19 MED ORDER — HYDROMORPHONE HCL 1 MG/ML IJ SOLN
1.0000 mg | Freq: Once | INTRAMUSCULAR | Status: AC | PRN
  Administered 2024-10-19: 1 mg via INTRAVENOUS
  Filled 2024-10-19: qty 1

## 2024-10-19 MED ORDER — HEPARIN (PORCINE) IN NACL 1000-0.9 UT/500ML-% IV SOLN
INTRAVENOUS | Status: DC | PRN
  Administered 2024-10-19: 1000 mL

## 2024-10-19 MED ORDER — HEPARIN (PORCINE) 25000 UT/250ML-% IV SOLN
1150.0000 [IU]/h | INTRAVENOUS | Status: DC
  Administered 2024-10-19: 1150 [IU]/h via INTRAVENOUS
  Filled 2024-10-19: qty 250

## 2024-10-19 MED ORDER — FAMOTIDINE 20 MG PO TABS: 40.0000 mg | ORAL_TABLET | Freq: Once | ORAL | Status: DC | PRN

## 2024-10-19 MED ORDER — FENTANYL CITRATE (PF) 100 MCG/2ML IJ SOLN
INTRAMUSCULAR | Status: AC
  Filled 2024-10-19: qty 2

## 2024-10-19 MED ORDER — CEFAZOLIN SODIUM-DEXTROSE 2-4 GM/100ML-% IV SOLN
2.0000 g | INTRAVENOUS | Status: AC
  Administered 2024-10-19: 2 g via INTRAVENOUS

## 2024-10-19 MED ORDER — DIPHENHYDRAMINE HCL 50 MG/ML IJ SOLN
INTRAMUSCULAR | Status: DC | PRN
  Administered 2024-10-19: 25 mg via INTRAVENOUS

## 2024-10-19 MED ORDER — INSULIN ASPART 100 UNIT/ML IJ SOLN
0.0000 [IU] | Freq: Three times a day (TID) | INTRAMUSCULAR | Status: DC
  Filled 2024-10-19: qty 1

## 2024-10-19 MED ORDER — LIDOCAINE-EPINEPHRINE (PF) 1 %-1:200000 IJ SOLN
INTRAMUSCULAR | Status: DC | PRN
  Administered 2024-10-19: 10 mL

## 2024-10-19 MED ORDER — SODIUM CHLORIDE 0.9 % IV SOLN: INTRAVENOUS | Status: DC

## 2024-10-19 MED ORDER — MIDAZOLAM HCL 2 MG/2ML IJ SOLN
INTRAMUSCULAR | Status: AC
  Filled 2024-10-19: qty 2

## 2024-10-19 SURGICAL SUPPLY — 18 items
BALLOON LUTONIX 018 4X220X130 (BALLOONS) IMPLANT
BALLOON LUTONIX 4X220X130 (BALLOONS) IMPLANT
BALLOON ULTRVRSE 2.5X300X150 (BALLOONS) IMPLANT
BALLOON ULTRVRSE 3X150X150 (BALLOONS) IMPLANT
CATH BEACON 5 .035 65 KMP TIP (CATHETERS) IMPLANT
COVER PROBE ULTRASOUND 5X96 (MISCELLANEOUS) IMPLANT
DEVICE PRESTO INFLATION (MISCELLANEOUS) IMPLANT
DEVICE RAD COMP TR BAND LRG (VASCULAR PRODUCTS) IMPLANT
DRAPE BRACHIAL (DRAPES) IMPLANT
DRAPE INCISE IOBAN 66X45 STRL (DRAPES) IMPLANT
GLIDEWIRE ADV .035X260CM (WIRE) IMPLANT
NEEDLE ENTRY 21GA 7CM ECHOTIP (NEEDLE) IMPLANT
PACK ANGIOGRAPHY (CUSTOM PROCEDURE TRAY) ×1 IMPLANT
SHEATH HALO 035 5FRX10 (SHEATH) IMPLANT
SHEATH MICROPUNCTURE PEDAL 4FR (SHEATH) IMPLANT
STENT LIFESTENT 5F 5X170X135 (Permanent Stent) IMPLANT
WIRE G V18X300CM (WIRE) IMPLANT
WIRE J 3MM .035X145CM (WIRE) IMPLANT

## 2024-10-19 NOTE — Progress Notes (Signed)
 PHARMACY - ANTICOAGULATION CONSULT NOTE  Pharmacy Consult for Heparin  Infusion Indication: Limb ischemia, superficial femoral artery stent occlusion  Allergies[1]  Patient Measurements: Height: 5' 5.98 (167.6 cm) Weight: 77.9 kg (171 lb 11.8 oz) IBW/kg (Calculated) : 59.26 HEPARIN  DW (KG): 74.9  Vital Signs: Temp: 98 F (36.7 C) (12/31 0334) Temp Source: Oral (12/31 0334) BP: 150/96 (12/31 0334) Pulse Rate: 62 (12/31 0334)  Labs: Recent Labs    10/18/24 1326 10/18/24 2226 10/19/24 0509  HGB 12.3  --  11.8*  HCT 36.9  --  35.8*  PLT 538*  --  563*  APTT 35  --   --   LABPROT 13.2  --   --   INR 0.9  --   --   HEPARINUNFRC  --  0.40 0.46  CREATININE 0.64  --   --     Estimated Creatinine Clearance: 79.7 mL/min (by C-G formula based on SCr of 0.64 mg/dL).   Medical History: Past Medical History:  Diagnosis Date   Anxiety    Arthritis    CAD (coronary artery disease)    a. PCI to proximal and distal RCA 3/26   Cancer [facility])    Mom grandma   Chest pain 04/09/2019   Chronic kidney disease    Mom   Depression 2023   Diabetes mellitus without complication (HCC)    GERD (gastroesophageal reflux disease)    Heart murmur    MR   Hiatal hernia    Hot flashes    Hyperlipidemia    Hypertension    Myocardial infarction (HCC)    Neuropathy    Thyroid  disease    Mom   Tobacco abuse    a. Started at age 64, quit during 3 pregnancies. b. 1 PPD for a 40 pack-year hx (2019)     Medications:  Not on anticoagulation per chart review  Assessment: Patient is a 58 year old female with a past medical history of AAA, hypertension, diabetes, peripheral artery disease, presenting to the ED complaining of 3 weeks of right calf pain. Pharmacy has been consulted to initiate patient on a heparin  infusion for limb ischemia and superficial femoral artery stent occlusion.  Baseline labs: Hgb 12.3 PLT 538 INR 0.9 aPTT 35  No signs/symptoms of bleeding noted in chart.  Goal  of Therapy:  Heparin  level 0.3-0.7 units/ml Monitor platelets by anticoagulation protocol: Yes   Plan:  12/31:  HL @ 0509 = 0.46, therapeutic X 2  -- Will continue pt on current rate and recheck HL on 01/01 with AM labs  -- Monitor CBC daily while on heparin   Tysheka Fanguy D, PharmD 10/19/2024,6:30 AM        [1]  Allergies Allergen Reactions   Strawberry Extract Hives and Swelling   Strawberry Flavoring Agent (Non-Screening) Rash

## 2024-10-19 NOTE — Progress Notes (Signed)
 " Progress Note   Patient: Heather Mahoney FMW:[id number] DOB: 1966-04-22 DOA: 10/18/2024     0 DOS: the patient was seen and examined on 10/19/2024   Brief hospital course:  Heather Mahoney is a 58 y.o. year old female with medical history of hypertension, hyperlipidemia, type 2 diabetes, peripheral arterial disease status post stent placement presented to the ED with right leg pain.  Patient had stent placed in right lower extremity and 08/2022.  He states he has been having pain in his right lower extremity for about 3 weeks that has been gradually worsening.  Given the symptoms, DVT study was obtained that showed no DVT but concern for stent occlusion.  EDP discussed case with vascular surgery who recommended heparin  drip along with likely angiogram this admission.  Given this, TRH consulted for admission.  Lab work obtained after admission showed CBC with mild leukocytosis that is chronic and stable along with thrombocytosis that is chronic and stable.  CMP with mild hypokalemia and moderate hyperglycemia otherwise unremarkable.  Magnesium  normal at 2.3.      Assessment and Plan:  Stenosis of peripheral vascular stent, initial encounter Patient with occlusion of superficial femoral artery stent resulting in leg pain.   Patient is status post vascular surgery intervention today Monitor closely   Hypertension:  Continue current antihypertensives   Hyperlipidemia: CAD/PAD:  Continue home statin, Zetia , aspirin ..  Holding Plavix  currently given heparin  drip and angiogram.   Type 2 diabetes:  Continue home Farxiga  and started on SSI here.     VTE prophylaxis:  IV heparin  gtts  Diet: HH Code Status:  Full Code   Family Communication: Updated at bedside   Consults called: Vascular surgery    Subjective:  Patient seen and examined at bedside this morning Denies nausea vomiting abdominal pain chest pain cough  Physical Exam: Gen: Middle-age female laying in bed in no acute  distress HENT: Normocephalic atraumatic CV: normal heart sounds Lung: With auscultation bilaterally Abd: Nontender, no masses palpable MSK: No lateralizing signs noted Neuro: alert and oriented  Vitals:   10/19/24 1515 10/19/24 1530 10/19/24 1545 10/19/24 1600  BP: (!) 130/98 (!) 140/88 130/87 133/68  Pulse: 62     Resp: 16 17 18 15   Temp:      TempSrc:      SpO2: 100%     Weight:      Height:        Data Reviewed: Doppler ultrasound of the lower extremity did not show any evidence of DVT on the right, there is occluded partially imaged right superficial femoral artery stent    Latest Ref Rng & Units 10/19/2024    5:09 AM 10/18/2024    1:26 PM 03/15/2024    1:26 PM  CBC  WBC 4.0 - 10.5 K/uL 12.8  11.2  11.9   Hemoglobin 12.0 - 15.0 g/dL 88.1  87.6  87.0   Hematocrit 36.0 - 46.0 % 35.8  36.9  41.0   Platelets 150 - 400 K/uL 563  538  520        Latest Ref Rng & Units 10/19/2024    5:09 AM 10/18/2024    1:26 PM 03/15/2024    1:26 PM  BMP  Glucose 70 - 99 mg/dL 880  841  899   BUN 6 - 20 mg/dL 10  14  13    Creatinine 0.44 - 1.00 mg/dL 9.33  9.35  9.02   BUN/Creat Ratio 9 - 23   13   Sodium  135 - 145 mmol/L 141  141  141   Potassium 3.5 - 5.1 mmol/L 3.9  3.3  4.2   Chloride 98 - 111 mmol/L 105  103  103   CO2 22 - 32 mmol/L 23  24  22    Calcium  8.9 - 10.3 mg/dL 9.2  9.5  9.7      Family Communication: None at baseline  Disposition: Status is: Inpatient   Time spent: 51 minutes  Author: Drue ONEIDA Potter, MD 10/19/2024 4:52 PM  For on call review [email or web address].  "

## 2024-10-19 NOTE — Op Note (Signed)
 [facility]  Percutaneous Study/Intervention Procedural Note   Date of Surgery: 10/19/2024  Surgeon(s):Cleatus Gabriel    Assistants:none  Pre-operative Diagnosis: PAD with disabling claudication right lower extremity, subacute on chronic symptoms  Post-operative diagnosis:  Same  Procedure(s) Performed:             1.  Ultrasound guidance for vascular access right posterior tibial artery              2.  Catheter placement into right common femoral artery from right posterior tibial artery             3.  Selective right lower extremity angiogram             4.  Percutaneous transluminal angioplasty of right posterior tibial artery with 2.5 mm diameter angioplasty balloon             5.  Percutaneous transluminal angioplasty of right SFA with 4 mm diameter Lutonix drug-coated angioplasty balloon  6.  Percutaneous transluminal angioplasty of the right popliteal artery and tibioperoneal trunk with 3 mm diameter angioplasty balloon             7.  Stent placement to the right distal SFA and most proximal popliteal artery with 5 mm diameter by 17 cm length life stent  EBL: 5 cc  Contrast: 55 cc  Fluoro Time: 4.9 minutes  Moderate Conscious Sedation Time: approximately 37 minutes using 4 mg of Versed  and 100 mcg of Fentanyl               Indications:  Patient is a 58 y.o.female with disabling claudication symptoms of the right lower extremity again.  She has had previous right lower extremity intervention and surgery after an aneurysm repair some years ago. The patient has noninvasive study showing occlusion of her right SFA stent. The patient is brought in for angiography for further evaluation and potential treatment. Risks and benefits are discussed and informed consent is obtained.   Procedure:  The patient was identified and appropriate procedural time out was performed.  The patient was then placed supine on the table and prepped and draped in the usual sterile  fashion. Moderate conscious sedation was administered during a face to face encounter with the patient throughout the procedure with my supervision of the RN administering medicines and monitoring the patient's vital signs, pulse oximetry, telemetry and mental status throughout from the start of the procedure until the patient was taken to the recovery room. Ultrasound was used to evaluate the right posterior tibial artery.  It was patent but small and diseased.  A digital ultrasound image was acquired.  A micropuncture needle was used to access the right posterior tibial artery under direct ultrasound guidance and a permanent image was performed.  A micropuncture wire and sheath were then placed.  A 0.035 J wire was advanced without resistance and a 5Fr sheath was placed.  Imaging performed through the sheath showed a small and diseased posterior tibial artery with areas of greater than 60% stenosis in the mid to distal posterior tibial artery in the proximal posterior tibial artery.  The distal popliteal artery was small and then appeared to occlude several centimeters below the previously placed SFA stent.  Imaging from below was not really adequate to evaluate more proximally.  He has an advantage wire and Kumpe catheter to cross the occlusion without difficulty and get up into the common femoral artery where imaging is performed.  Selective right lower extremity angiogram was  then performed. This demonstrated the right common femoral artery, profunda femoris artery, and proximal superficial femoral artery were patent.  There was occlusion a few centimeters above the previously placed SFA stent and this reconstituted in the mid popliteal artery a small somewhat diseased popliteal artery.  The anterior tibial artery was occluded in the proximal segment but did reconstitute in the midsegment and appeared to then provide flow to the foot.  The posterior tibial artery was the only continuous runoff distally and that  was our access site and had some disease as described above with 2 areas of greater than 60% stenosis. It was felt that it was in the patient's best interest to proceed with intervention after these images to avoid a second procedure and a larger amount of contrast and fluoroscopy based off of the findings from the initial angiogram. The patient was systemically heparinized.  I then proceeded with angioplasty.  Over a V18 wire, a 2.5 mm diameter by 30 cm length angioplasty balloon was used to treat the posterior tibial artery and tibioperoneal trunk and was inflated to 8 atm for 1 minute.  A 4 mm diameter by 22 cm length Lutonix drug-coated angioplasty balloon was used in the mid and distal SFA and above-knee popliteal artery and taken up to 10 atm for 1 minute.  Completion imaging showed high-grade residual stenosis throughout the previously placed stent and a new stent would be required.  The popliteal artery and the distal and mid to distal segment appeared to have some stenosis but was a very small vessel.  A 5 mm diameter by 17 cm length life stent was selected and deployed in the mid and distal SFA and proximal popliteal artery.  This was postdilated with a 4 mm diameter Lutonix drug-coated balloon that was also used to treat the above-knee popliteal artery with angioplasty.  The mid and distal popliteal artery and tibioperoneal trunk were then addressed with a 3 mm diameter by 15 cm length angioplasty balloon inflated to 6 atm for 1 minute.  Completion imaging following this showed marked improvement.  There appeared to be less than 30% residual stenosis in the posterior tibial artery and tibioperoneal trunk.  There is less than 20% residual stenosis in the right SFA and popliteal arteries after angioplasty and stent placement.  I elected to terminate the procedure. The sheath was removed and TR band was placed at the access site with excellent hemostatic result. The patient was taken to the recovery room in  stable condition having tolerated the procedure well.  Findings:                           Right lower Extremity:  This demonstrated the right common femoral artery, profunda femoris artery, and proximal superficial femoral artery were patent.  There was occlusion a few centimeters above the previously placed SFA stent and this reconstituted in the mid popliteal artery a small somewhat diseased popliteal artery.  The anterior tibial artery was occluded in the proximal segment but did reconstitute in the midsegment and appeared to then provide flow to the foot.  The posterior tibial artery was the only continuous runoff distally and that was our access site and had some disease as described above with 2 areas of greater than 60% stenosis.   Disposition: Patient was taken to the recovery room in stable condition having tolerated the procedure well.  Complications: None  Selinda Gu 10/19/2024 1:45 PM   This note was  created with Dragon Medical transcription system. Any errors in dictation are purely unintentional.

## 2024-10-19 NOTE — TOC CM/SW Note (Addendum)
 Transition of Care [facility]) - Inpatient Brief Assessment   Patient Details  Name: Uma NADALYN DERINGER MRN: [id number] Date of Birth: 07/17/1966  Transition of Care [facility]) CM/SW Contact:    Corean ONEIDA Haddock, RN Phone Number: 10/19/2024, 10:02 AM   Clinical Narrative:   Transition of Care Department [facility]) has reviewed patient and no TOC needs have been identified at this time.  If new patient transition needs arise, please place a TOC consult.  Per SDOH food and housing resources added to avs  Transition of Care Asessment: Insurance and Status: Insurance coverage has been reviewed Patient has primary care physician: Yes     Prior/Current Home Services: No current home services Social Drivers of Health Review: SDOH reviewed interventions complete Readmission risk has been reviewed: No (obs status.  no score generated) Transition of care needs: no transition of care needs at this time

## 2024-10-19 NOTE — Interval H&P Note (Signed)
 History and Physical Interval Note:  10/19/2024 9:53 AM  Heather Mahoney  has presented today for surgery, with the diagnosis of Atherosclerosis obliterans with intermittent claudication.  The various methods of treatment have been discussed with the patient and family. After consideration of risks, benefits and other options for treatment, the patient has consented to  Procedures: Lower Extremity Angiography (Right) as a surgical intervention.  The patient's history has been reviewed, patient examined, no change in status, stable for surgery.  I have reviewed the patient's chart and labs.  Questions were answered to the patient's satisfaction.     [name]

## 2024-10-19 NOTE — Discharge Instructions (Signed)
 Rent/Utility/Housing  Agency Name: [facility] Agency Address: [address] [place], [place] [address] Phone: [phone] Email: [email or web address] Website: [email or web address].org Service(s) Offered: Housing services, self-sufficiency, congregate meal program, weatherization program, Field seismologist program, emergency food assistance,  housing counseling, home ownership program, wheels -towork program.  Agency Name: [facility] Address: [address], [place], [place] [address] Phone: [phone] (8a-4p) [phone] (8p- 10p) Email: [email or web address] Website: [email or web address] Service(s) Offered: A program for homeless and/or needy men that includes one-on-one counseling, life skills training and job rehabilitation.  Agency Name: [facility] [place] Address: [address], [place], [place] [address] Phone: [phone] Website: www.[email or web address].org Service(s) Offered: Assistance to needy in emergency with utility bills, heating fuel, and prescriptions. Shelter for homeless 7pm-7am. February 12, 2017 15  Agency Name: [facility] [place] [facility]Developmentally Disabled) Address: [address], [place], [place] [address] Phone: [phone] Contact Person: Cathleen Corti Email: [email or web address] Website: [email or web address].[email or web address] Service(s) Offered: Helps individuals with developmental disabilities move from housing that is more restrictive to homes where they  can achieve greater independence and have more  opportunities.  Agency Name: [facility] Address: [address], [place], [place] [address] Phone: [phone] Email: [email or web address] Website: www.[email or web address].org Service(s) Offered: Provides affordable housing for low-income families, elderly, and disabled individuals. Offer a wide range of  programs and services, from financial planning to  afterschool and summer programs.  Agency Name: [facility] Address: [address] [place], [place] [address] Phone: [phone] Service(s) Offered: Child support services; child welfare services; food stamps; Medicaid; work first family assistance; and aid with fuel,  rent, food and medicine.  Agency Name: [facility] [place] [employer]. Address: [facility] [address], [place], [place]  [address] Phone: [phone] Website: www.familyabuseservices.org Service(s) Offered: 24 hour Crisis Line: [phone]; 24 hour Emergency Shelter; Transitional Housing; Support Groups; Scientist, physiological; [employer]; Hispanic Outreach: [phone];  Visitation Center: [phone].  Agency Name: [facility] [place]. Address: [address] [place], [place] [address] Phone: [phone] Service(s) Offered: CAP Services; Home and [employer]; Individual or Group Supports; Respite Care Non-Institutional Nursing;  Residential Supports; Respite Care and Personal Care Services; Transportation; Family and Friends Night; Recreational Activities; Three Nutritious Meals/Snacks; Consultation with Registered Dietician; Twenty-four hour Registered Nurse Access; Daily and [employer]; Camp Green Leaves; [place] for the [employer] (During Summer Months) Bingo Night (Every  Wednesday Night); Special Populations Dance Night  (Every Tuesday Night); Professional Hair Care Services.  Agency Name: [facility] Address: [address], [place], [place] [address] Phone: [phone] Contact Person: Jabier Mutton Website: [email or web address].Physicist, medical) Offered: Residential treatment facility for women; food and  clothing, educational & employment development and  transportation to work; Counsellor of financial skills;  parenting and family reunification; emotional and spiritual  support;  transitional housing for program graduates.  Agency Name: [facility] Address: [address], [place], [place] [address] Phone: [phone] Email: [email or web address] Website: [email or web address].es Service(s) Offered: Public housing units for elderly, disabled, and low income people; housing choice vouchers for income eligible  applicants; shelter plus care vouchers; and Psychologist, clinical.  Agency Name: [facility] [employer] Address: [address] [place], [place] [address] Phone: [phone] Email: [email or web address] Website: www.habitatalamance.org Service(s) Offered: Build houses for families in need of decent housing. Each adult in the family must invest 200 hours of labor on  someone else's house, work with volunteers to build their own house, attend classes  on budgeting, home maintenance, yard care, and attend homeowner association meetings.  Agency Name: [facility] Address: [address] [place], [place] [address] Phone: [phone] Website: www.rsli.org Service(s) Offered: Intermediate care facilities for intellectually delayed, Supervised Living in group homes for adults with developmental disabilities, Supervised Living for people who have dual diagnoses (MRMI), Independent Living, Supported Living, respite and a variety of CAP services, pre-vocational services, day supports, and [employer].  Agency Name: [place] [facility] Phone: [phone] Website: www.NCForeclosurePrevention.gov Service(s) Offered: Zero-interest, deferred loans to homeowners struggling to pay their mortgage. Call for more information.     Food Resources  Agency Name: [facility] Agency Address: [address], [place], [place] [address] Phone: [phone] Website: www.alamanceservices.org Service(s) Offered: Housing services, self-sufficiency, congregate meal program, weatherization program,  Event organiser program, emergency food assistance,  housing counseling, home ownership program, wheels - to work program.  Dole Food free for 60 and older at various locations from [employer], Monday-Friday:  [employer], [address] [place], [phone] -[facility], [address], [place] [phone]  -[place], [address], [place] [phone]  -[address], [address] [place], [phone]  Agency Name: [facility] Address: [phone] [address], [place], [place] [address] Phone: [phone] Website: www.alamancemow.org Service(s) Offered: Home delivered hot, frozen, and emergency  meals. Grocery assistance program which matches  volunteers one-on-one with seniors unable to grocery shop  for themselves. Must be 60 years and older; less than 20  hours of in-home aide service, limited or no driving ability;  live alone or with someone with a disability; live in  [place].  Agency Name: [facility]) Address: [address], [place], [place] [address] Phone: [phone] Service(s) Offered: Food is served to shut-ins, homeless, elderly, and low income people in the community every Saturday (11:30 am-12:30 pm) and Sunday (12:30 pm-1:30pm). Volunteers also offer help and encouragement in seeking employment,  and spiritual guidance.  Agency Name: Department of Social Services Address: [address] [place], [place] [address] Phone: [phone] Service(s) Offered: Child support services; child welfare services; food stamps; Medicaid; work first family assistance; and aid with fuel,  rent, food and medicine.  Agency Name: [facility] Address: [address], [place], [place] Phone: [phone] Website: www.dreamalign.com Services Offered: Monday 10:00am-12:00, 8:00pm-9:00pm, and Friday 10:00am-12:00.  Agency Name:  [facility] [place] Address: [address], [place], [place] [address] Phone: [phone] Website: www.[email or web address].org Service(s) Offered: Serves weekday meals, open from 11:30 am- 1:00 pm., and 6:30-7:30pm, Monday-Wednesday-Friday distributes food 3:30-6pm, Monday-Wednesday-Friday.  Agency Name: [facility] Address: [address], [place], [place] Phone: [phone] Website: [email or web address].[email or web address] Services Offered: Distributes food the 4th Saturday of the month, starting at 8:00 am  Agency Name: [facility] Address: [phone] [address], [place], [place] [address] Phone: [phone] Website: [email or web address] Service(s) Offered: Bread of life, weekly food pantry. Open Wednesdays from 10:00am-noon.  Agency Name: [facility] Address: [address] [place], [place], [place] Phone: [phone] Services Offered: Distributes food 9am-1pm, Monday-Thursday. Call for details.  Agency Name: [facility] Address: [address] [place], [place] [address] Phone: [phone] Website: firstbaptistburlington.[email or web address] Service(s) Offered: Games developer. Call for assistance.  Agency Name: [facility] Address: [address] [place], [place] [address] Phone: [phone] Service Offered: Emergency Food Pantry. Call for appointment.  Agency Name: [facility] Address: [phone]  [address] [place], [place] [address] Phone: [phone] Website: [email or web address] Services Offered: Games developer. Call for details  Agency Name: [facility] Address: [address] [place], [place] Phone: [phone] Website: [email or web address] Service(s) Offered: Emergency Food Pantry. Call for details.  Agency Name: [facility] Address: [address] [place], [place] [address] Phone: [phone] or [phone] Website:  [email or web address] Service(s) Offered: Distribute food 9am-11:30 am, Tuesday-Friday, and 1-3:30pm, Monday-Friday. Food pantry Monday-Friday 1pm-3pm, fresh items, Mon.-Wed.-Fri.  Agency Name: [facility] Address: [address] [place], [place] [address] Phone: [phone] Website: www.safealamance.org Services Offered: Distribute food Tues and Sats from 9:00am-noon. Closed 1st Saturday of each month. Call for details  Agency Name: [facility] Address: [address] [facility] [address], [place] [address] Phone: [phone]  Services Offered: Delivers meals every Thursday

## 2024-10-19 NOTE — Progress Notes (Signed)
 Dr. Marea made aware of pt c/o numb foot. No new orders at this time.

## 2024-10-19 NOTE — Progress Notes (Signed)
 PHARMACY - ANTICOAGULATION CONSULT NOTE  Pharmacy Consult for Heparin  Infusion Indication: Limb ischemia, superficial femoral artery stent occlusion  Allergies[1]  Patient Measurements: Height: 5' 5.98 (167.6 cm) Weight: 76.8 kg (169 lb 5 oz) IBW/kg (Calculated) : 59.26 HEPARIN  DW (KG): 74.9  Vital Signs: Temp: 98.1 F (36.7 C) (12/30 2008) BP: 170/106 (12/30 2100) Pulse Rate: 66 (12/30 2100)  Labs: Recent Labs    10/18/24 1326 10/18/24 2226  HGB 12.3  --   HCT 36.9  --   PLT 538*  --   APTT 35  --   LABPROT 13.2  --   INR 0.9  --   HEPARINUNFRC  --  0.40  CREATININE 0.64  --     Estimated Creatinine Clearance: 79.2 mL/min (by C-G formula based on SCr of 0.64 mg/dL).   Medical History: Past Medical History:  Diagnosis Date   Anxiety    Arthritis    CAD (coronary artery disease)    a. PCI to proximal and distal RCA 3/26   Cancer [facility])    Mom grandma   Chest pain 04/09/2019   Chronic kidney disease    Mom   Depression 2023   Diabetes mellitus without complication (HCC)    GERD (gastroesophageal reflux disease)    Heart murmur    MR   Hiatal hernia    Hot flashes    Hyperlipidemia    Hypertension    Myocardial infarction (HCC)    Neuropathy    Thyroid  disease    Mom   Tobacco abuse    a. Started at age 55, quit during 3 pregnancies. b. 1 PPD for a 40 pack-year hx (2019)     Medications:  Not on anticoagulation per chart review  Assessment: Patient is a 58 year old female with a past medical history of AAA, hypertension, diabetes, peripheral artery disease, presenting to the ED complaining of 3 weeks of right calf pain. Pharmacy has been consulted to initiate patient on a heparin  infusion for limb ischemia and superficial femoral artery stent occlusion.  Baseline labs: Hgb 12.3 PLT 538 INR 0.9 aPTT 35  No signs/symptoms of bleeding noted in chart.  Goal of Therapy:  Heparin  level 0.3-0.7 units/ml Monitor platelets by anticoagulation  protocol: Yes   Plan:  12/30:  HL @ 2226 = 0.40, therapeutic X 1  - Will continue pt on current rate and recheck HL in 6 hrs - Monitor CBC daily while on heparin   Annalei Friesz D, PharmD 10/19/2024,12:18 AM       [1]  Allergies Allergen Reactions   Strawberry Extract Hives and Swelling   Strawberry Flavoring Agent (Non-Screening) Rash

## 2024-10-20 DIAGNOSIS — T82856A Stenosis of peripheral vascular stent, initial encounter: Secondary | ICD-10-CM | POA: Diagnosis not present

## 2024-10-20 LAB — BASIC METABOLIC PANEL WITH GFR
Anion gap: 12 (ref 5–15)
BUN: 11 mg/dL (ref 6–20)
CO2: 25 mmol/L (ref 22–32)
Calcium: 9.5 mg/dL (ref 8.9–10.3)
Chloride: 102 mmol/L (ref 98–111)
Creatinine, Ser: 0.68 mg/dL (ref 0.44–1.00)
GFR, Estimated: >60 mL/min
Glucose, Bld: 126 mg/dL — ABNORMAL HIGH (ref 70–99)
Potassium: 3.9 mmol/L (ref 3.5–5.1)
Sodium: 140 mmol/L (ref 135–145)

## 2024-10-20 LAB — CBC WITH DIFFERENTIAL/PLATELET
Abs Immature Granulocytes: 0.06 K/uL (ref 0.00–0.07)
Basophils Absolute: 0.1 K/uL (ref 0.0–0.1)
Basophils Relative: 1 %
Eosinophils Absolute: 0.2 K/uL (ref 0.0–0.5)
Eosinophils Relative: 1 %
HCT: 35.9 % — ABNORMAL LOW (ref 36.0–46.0)
Hemoglobin: 11.9 g/dL — ABNORMAL LOW (ref 12.0–15.0)
Immature Granulocytes: 0 %
Lymphocytes Relative: 27 %
Lymphs Abs: 4 K/uL (ref 0.7–4.0)
MCH: 27.2 pg (ref 26.0–34.0)
MCHC: 33.1 g/dL (ref 30.0–36.0)
MCV: 82.2 fL (ref 80.0–100.0)
Monocytes Absolute: 0.9 K/uL (ref 0.1–1.0)
Monocytes Relative: 6 %
Neutro Abs: 9.6 K/uL — ABNORMAL HIGH (ref 1.7–7.7)
Neutrophils Relative %: 65 %
Platelets: 573 K/uL — ABNORMAL HIGH (ref 150–400)
RBC: 4.37 MIL/uL (ref 3.87–5.11)
RDW: 16 % — ABNORMAL HIGH (ref 11.5–15.5)
WBC: 14.9 K/uL — ABNORMAL HIGH (ref 4.0–10.5)
nRBC: 0 % (ref 0.0–0.2)

## 2024-10-20 LAB — GLUCOSE, CAPILLARY
Glucose-Capillary: 94 mg/dL (ref 70–99)
Glucose-Capillary: 106 mg/dL — ABNORMAL HIGH (ref 70–99)

## 2024-10-20 LAB — HEPARIN LEVEL (UNFRACTIONATED): Heparin Unfractionated: 0.53 [IU]/mL (ref 0.30–0.70)

## 2024-10-20 MED ORDER — APIXABAN 5 MG PO TABS: 5.0000 mg | ORAL_TABLET | Freq: Two times a day (BID) | ORAL | 2 refills | Status: AC

## 2024-10-20 MED ORDER — ACETAMINOPHEN 325 MG PO TABS: 650.0000 mg | ORAL_TABLET | Freq: Four times a day (QID) | ORAL | 0 refills | Status: AC | PRN

## 2024-10-20 NOTE — Progress Notes (Signed)
 PHARMACY - ANTICOAGULATION CONSULT NOTE  Pharmacy Consult for Heparin  Infusion Indication: Limb ischemia, superficial femoral artery stent occlusion  Allergies[1]  Patient Measurements: Height: 5' 5.98 (167.6 cm) Weight: 77.9 kg (171 lb 11.8 oz) IBW/kg (Calculated) : 59.26 HEPARIN  DW (KG): 74.9  Vital Signs: Temp: 98.6 F (37 C) (12/31 2105) Temp Source: Oral (12/31 2105) BP: 149/74 (12/31 2105) Pulse Rate: 58 (12/31 2105)  Labs: Recent Labs    10/18/24 1326 10/18/24 2226 10/19/24 0509 10/20/24 0010  HGB 12.3  --  11.8*  --   HCT 36.9  --  35.8*  --   PLT 538*  --  563*  --   APTT 35  --   --   --   LABPROT 13.2  --   --   --   INR 0.9  --   --   --   HEPARINUNFRC  --  0.40 0.46 0.53  CREATININE 0.64  --  0.66  --     Estimated Creatinine Clearance: 79.7 mL/min (by C-G formula based on SCr of 0.66 mg/dL).   Medical History: Past Medical History:  Diagnosis Date   Anxiety    Arthritis    CAD (coronary artery disease)    a. PCI to proximal and distal RCA 3/26   [facility]cer [facility])    Mom grandma   Chest pain 04/09/2019   Chronic kidney disease    Mom   Depression 2023   Diabetes mellitus without complication (HCC)    GERD (gastroesophageal reflux disease)    Heart murmur    MR   Hiatal hernia    Hot flashes    Hyperlipidemia    Hypertension    Myocardial infarction (HCC)    Neuropathy    Thyroid  disease    Mom   Tobacco abuse    a. Started at age 50, quit during 3 pregnancies. b. 1 PPD for a 40 pack-year hx (2019)     Medications:  Not on anticoagulation per chart review  Assessment: Patient is a 59 year old female with a past medical history of AAA, hypertension, diabetes, peripheral artery disease, presenting to the ED complaining of 3 weeks of right calf pain. Pharmacy has been consulted to initiate patient on a heparin  infusion for limb ischemia and superficial femoral artery stent occlusion.  Baseline labs: Hgb 12.3 PLT 538 INR 0.9 aPTT  35  No signs/symptoms of bleeding noted in chart.  Goal of Therapy:  Heparin  level 0.3-0.7 units/ml Monitor platelets by anticoagulation protocol: Yes   Plan:  01/01:  HL @ 0010 = 0.53, therapeutic X 3 - Will continue pt on current rate and recheck HL on 01/02 with AM labs - Monitor CBC daily while on heparin   Meryem Haertel D, PharmD 10/20/2024,1:27 AM         [1]  Allergies Allergen Reactions   Strawberry Extract Hives and Swelling   Strawberry Flavoring Agent (Non-Screening) Rash

## 2024-10-20 NOTE — Plan of Care (Signed)
 " Problem: Education: Goal: Knowledge of General Education information will improve Description: Including pain rating scale, medication(s)/side effects and non-pharmacologic comfort measures 10/20/2024 1339 by Jaylyne Breese, RN Outcome: Adequate for Discharge 10/20/2024 0916 by Maily Debarge, RN Outcome: Progressing   Problem: Health Behavior/Discharge Planning: Goal: Ability to manage health-related needs will improve 10/20/2024 1339 by Rohil Lesch, RN Outcome: Adequate for Discharge 10/20/2024 0916 by Helon Mad, RN Outcome: Progressing   Problem: Clinical Measurements: Goal: Ability to maintain clinical measurements within normal limits will improve 10/20/2024 1339 by Helon Mad, RN Outcome: Adequate for Discharge 10/20/2024 0916 by Helon Mad, RN Outcome: Progressing Goal: Will remain free from infection 10/20/2024 1339 by Mckale Haffey, RN Outcome: Adequate for Discharge 10/20/2024 0916 by Helon Mad, RN Outcome: Progressing Goal: Diagnostic test results will improve 10/20/2024 1339 by Caley Volkert, RN Outcome: Adequate for Discharge 10/20/2024 0916 by Helon Mad, RN Outcome: Progressing Goal: Respiratory complications will improve 10/20/2024 1339 by Bassem Bernasconi, RN Outcome: Adequate for Discharge 10/20/2024 0916 by Helon Mad, RN Outcome: Progressing Goal: Cardiovascular complication will be avoided 10/20/2024 1339 by Ramiro Pangilinan, RN Outcome: Adequate for Discharge 10/20/2024 0916 by Helon Mad, RN Outcome: Progressing   Problem: Activity: Goal: Risk for activity intolerance will decrease 10/20/2024 1339 by Curt Oatis, RN Outcome: Adequate for Discharge 10/20/2024 0916 by Helon Mad, RN Outcome: Progressing   Problem: Nutrition: Goal: Adequate nutrition will be maintained 10/20/2024 1339 by Duha Abair, RN Outcome: Adequate for Discharge 10/20/2024 0916 by Helon Mad, RN Outcome:  Progressing   Problem: Coping: Goal: Level of anxiety will decrease 10/20/2024 1339 by Lesean Woolverton, RN Outcome: Adequate for Discharge 10/20/2024 0916 by Helon Mad, RN Outcome: Progressing   Problem: Elimination: Goal: Will not experience complications related to bowel motility 10/20/2024 1339 by Helon Mad, RN Outcome: Adequate for Discharge 10/20/2024 0916 by Helon Mad, RN Outcome: Progressing Goal: Will not experience complications related to urinary retention 10/20/2024 1339 by Helon Mad, RN Outcome: Adequate for Discharge 10/20/2024 0916 by Helon Mad, RN Outcome: Progressing   Problem: Pain Managment: Goal: General experience of comfort will improve and/or be controlled 10/20/2024 1339 by Helon Mad, RN Outcome: Adequate for Discharge 10/20/2024 0916 by Helon Mad, RN Outcome: Progressing   Problem: Safety: Goal: Ability to remain free from injury will improve 10/20/2024 1339 by Helon Mad, RN Outcome: Adequate for Discharge 10/20/2024 0916 by Helon Mad, RN Outcome: Progressing   Problem: Skin Integrity: Goal: Risk for impaired skin integrity will decrease 10/20/2024 1339 by Helon Mad, RN Outcome: Adequate for Discharge 10/20/2024 0916 by Helon Mad, RN Outcome: Progressing   Problem: Education: Goal: Ability to describe self-care measures that may prevent or decrease complications (Diabetes Survival Skills Education) will improve 10/20/2024 1339 by Helon Mad, RN Outcome: Adequate for Discharge 10/20/2024 0916 by Helon Mad, RN Outcome: Progressing Goal: Individualized Educational Video(s) 10/20/2024 1339 by Ebunoluwa Gernert, RN Outcome: Adequate for Discharge 10/20/2024 0916 by Helon Mad, RN Outcome: Progressing   Problem: Coping: Goal: Ability to adjust to condition or change in health will improve 10/20/2024 1339 by Nitish Roes, RN Outcome: Adequate for  Discharge 10/20/2024 0916 by Helon Mad, RN Outcome: Progressing   Problem: Fluid Volume: Goal: Ability to maintain a balanced intake and output will improve 10/20/2024 1339 by Helon Mad, RN Outcome: Adequate for Discharge 10/20/2024 0916 by Jesika Men, RN Outcome: Progressing   Problem: Health Behavior/Discharge Planning: Goal: Ability to identify and utilize available resources and services will improve 10/20/2024 1339 by Emina Ribaudo, RN Outcome: Adequate for Discharge 10/20/2024  9083 by Joyclyn Plazola, RN Outcome: Progressing Goal: Ability to manage health-related needs will improve 10/20/2024 1339 by Kazuo Durnil, RN Outcome: Adequate for Discharge 10/20/2024 0916 by Helon Mad, RN Outcome: Progressing   Problem: Metabolic: Goal: Ability to maintain appropriate glucose levels will improve 10/20/2024 1339 by Lonnel Gjerde, RN Outcome: Adequate for Discharge 10/20/2024 0916 by Helon Mad, RN Outcome: Progressing   Problem: Nutritional: Goal: Maintenance of adequate nutrition will improve 10/20/2024 1339 by Thelma Lorenzetti, RN Outcome: Adequate for Discharge 10/20/2024 0916 by Helon Mad, RN Outcome: Progressing Goal: Progress toward achieving an optimal weight will improve 10/20/2024 1339 by Willeen Novak, RN Outcome: Adequate for Discharge 10/20/2024 0916 by Helon Mad, RN Outcome: Progressing   Problem: Skin Integrity: Goal: Risk for impaired skin integrity will decrease 10/20/2024 1339 by Helon Mad, RN Outcome: Adequate for Discharge 10/20/2024 0916 by Helon Mad, RN Outcome: Progressing   Problem: Tissue Perfusion: Goal: Adequacy of tissue perfusion will improve 10/20/2024 1339 by Shekela Goodridge, RN Outcome: Adequate for Discharge 10/20/2024 0916 by Helon Mad, RN Outcome: Progressing   "

## 2024-10-20 NOTE — Plan of Care (Signed)
" °  Problem: Education: Goal: Knowledge of General Education information will improve Description: Including pain rating scale, medication(s)/side effects and non-pharmacologic comfort measures Outcome: Progressing   Problem: Health Behavior/Discharge Planning: Goal: Ability to manage health-related needs will improve Outcome: Progressing   Problem: Clinical Measurements: Goal: Ability to maintain clinical measurements within normal limits will improve Outcome: Progressing Goal: Respiratory complications will improve Outcome: Progressing Goal: Cardiovascular complication will be avoided Outcome: Progressing   Problem: Activity: Goal: Risk for activity intolerance will decrease Outcome: Progressing   Problem: Nutrition: Goal: Adequate nutrition will be maintained Outcome: Progressing   Problem: Coping: Goal: Level of anxiety will decrease Outcome: Progressing   Problem: Pain Managment: Goal: General experience of comfort will improve and/or be controlled Outcome: Progressing   Problem: Safety: Goal: Ability to remain free from injury will improve Outcome: Progressing   Problem: Skin Integrity: Goal: Risk for impaired skin integrity will decrease Outcome: Progressing   Problem: Metabolic: Goal: Ability to maintain appropriate glucose levels will improve Outcome: Progressing   Problem: Nutritional: Goal: Maintenance of adequate nutrition will improve Outcome: Progressing   Problem: Tissue Perfusion: Goal: Adequacy of tissue perfusion will improve Outcome: Progressing   "

## 2024-10-20 NOTE — Progress Notes (Signed)
" ° ° °  Subjective  - POD # 1, status post retrograde stenting of the right SFA  Says her right leg feels much better today   Physical Exam:  Posterior tibial access site without hematoma.  The foot is warm and well-perfused.       Assessment/Plan:  POD #1  Subjectively, the patient's right leg feels much better today.  She is stable for discharge.  I would recommend changing her to Eliquis and aspirin  in addition to her Plavix .  Her heparin  drip can be discontinued once she is on her Eliquis  Wells Marialuisa Basara 10/20/2024 10:11 AM --  Vitals:   10/20/24 0401 10/20/24 0826  BP: (!) 144/94 (!) 162/97  Pulse: 64 72  Resp: 16 16  Temp: (!) 97.5 F (36.4 C) 98.2 F (36.8 C)  SpO2: 100% 100%    Intake/Output Summary (Last 24 hours) at 10/20/2024 1011 Last data filed at 10/20/2024 0508 Gross per 24 hour  Intake 487.96 ml  Output --  Net 487.96 ml     Laboratory CBC    Component Value Date/Time   WBC 14.9 (H) 10/20/2024 0507   HGB 11.9 (L) 10/20/2024 0507   HGB 12.9 03/15/2024 1326   HCT 35.9 (L) 10/20/2024 0507   HCT 41.0 03/15/2024 1326   PLT 573 (H) 10/20/2024 0507   PLT 520 (H) 03/15/2024 1326    BMET    Component Value Date/Time   NA 140 10/20/2024 0507   NA 141 03/15/2024 1326   K 3.9 10/20/2024 0507   CL 102 10/20/2024 0507   CO2 25 10/20/2024 0507   GLUCOSE 126 (H) 10/20/2024 0507   BUN 11 10/20/2024 0507   BUN 13 03/15/2024 1326   CREATININE 0.68 10/20/2024 0507   CALCIUM  9.5 10/20/2024 0507   GFRNONAA >60 10/20/2024 0507   GFRAA >60 07/05/2020 0834    COAG Lab Results  Component Value Date   INR 0.9 10/18/2024   INR 1.03 08/13/2018   INR 1.08 01/08/2016   No results found for: PTT  Antibiotics Anti-infectives (From admission, onward)    Start     Dose/Rate Route Frequency Ordered Stop   10/19/24 0824  ceFAZolin  (ANCEF ) IVPB 2g/100 mL premix        2 g 200 mL/hr over 30 Minutes Intravenous 30 min pre-op 10/19/24 0824 10/19/24 1339         V. Malvina Serene CLORE, M.D., [facility]  Pager:  [phone]  "

## 2024-10-20 NOTE — Discharge Summary (Signed)
 " Physician Discharge Summary   Patient: Heather Mahoney MRN: [id number] DOB: July 23, 1966  Admit date:     10/18/2024  Discharge date: 10/20/2024  Discharge Physician: Drue ONEIDA Potter   PCP: Knute Thersia Bitters, FNP   Recommendations at discharge:  Follow-up with vascular surgery  Discharge Diagnoses: Principal Problem:   Stenosis of peripheral vascular stent, initial encounter Active Problems:   Hypertension   Mixed hyperlipidemia   Tobacco use disorder   PAD (peripheral artery disease)   CAD (coronary artery disease)   Atherosclerosis of native arteries of extremity with intermittent claudication   Type 2 diabetes mellitus without complication, without long-term current use of insulin (HCC)   Superficial femoral artery occlusion  Resolved Problems:   * No resolved hospital problems. [facility] Course: Heather Mahoney is a 59 y.o. year old female with medical history of hypertension, hyperlipidemia, type 2 diabetes, peripheral arterial disease status post stent placement presented to the ED with right leg pain. Patient had stent placed in right lower extremity and 08/2022. He states he has been having pain in his right lower extremity for about 3 weeks that has been gradually worsening. Given the symptoms, DVT study was obtained that showed no DVT but concern for stent occlusion. EDP discussed case with vascular surgery who recommended heparin  drip along with likely angiogram this admission. Patient was taken for retrograde stenting of the right SFA.  Had been cleared by vascular surgeon today for discharge and will follow-up as an outpatient.  Vascular surgeon has recommended patient to be discharged on Eliquis, aspirin  and statin therapy.    Consultants: Vascular Procedures performed: As mentioned above Disposition: Home Diet recommendation:  Cardiac diet DISCHARGE MEDICATION: Allergies as of 10/20/2024       Reactions   Strawberry Extract Hives, Swelling   Strawberry Flavoring  Agent (non-screening) Rash        Medication List     STOP taking these medications    clopidogrel  75 MG tablet Commonly known as: PLAVIX        TAKE these medications    acetaminophen  325 MG tablet Commonly known as: TYLENOL  Take 2 tablets (650 mg total) by mouth every 6 (six) hours as needed for mild pain (pain score 1-3) or fever (or Fever >/= 101).   amLODipine  10 MG tablet Commonly known as: NORVASC  Take 1 tablet by mouth once daily   apixaban 5 MG Tabs tablet Commonly known as: ELIQUIS Take 1 tablet (5 mg total) by mouth 2 (two) times daily.   aspirin  EC 81 MG tablet Take 1 tablet (81 mg total) by mouth daily.   Blood Pressure Cuff Misc Check blood pressure as instructed by your physician   cloNIDine  0.2 MG tablet Commonly known as: CATAPRES  Take 1 tablet (0.2 mg total) by mouth in the morning and at bedtime. Take an extra 0.1 mg for BP >160   ezetimibe  10 MG tablet Commonly known as: ZETIA  Take 1 tablet (10 mg total) by mouth daily.   Farxiga  10 MG Tabs tablet Generic drug: dapagliflozin  propanediol Take 1 tablet by mouth once daily   gabapentin  600 MG tablet Commonly known as: NEURONTIN  Take 1 tablet (600 mg total) by mouth 3 (three) times daily as needed.   losartan  100 MG tablet Commonly known as: COZAAR  Take 1 tablet by mouth once daily   metoprolol  succinate 50 MG 24 hr tablet Commonly known as: TOPROL -XL TAKE 1 TABLET BY MOUTH ONCE DAILY.  TAKE WITH OR IMMEDIATELY AFTER A MEAL  pantoprazole  40 MG tablet Commonly known as: PROTONIX  Take 1 tablet (40 mg total) by mouth daily.   rosuvastatin  40 MG tablet Commonly known as: CRESTOR  Take 1 tablet (40 mg total) by mouth daily.   venlafaxine  75 MG tablet Commonly known as: EFFEXOR  Take 1 tablet (75 mg total) by mouth 2 (two) times daily with a meal.        Discharge Exam: Filed Weights   10/18/24 1300 10/19/24 0234 10/20/24 0444  Weight: 76.8 kg 77.9 kg 80.8 kg   Gen: Middle-age  FE female laying in bed in no acute distress HENT: Normocephalic atraumatic CV: normal heart sounds Lung: With auscultation bilaterally Abd: Nontender, no masses palpable MSK: No lateralizing signs noted Neuro: alert and oriented  Condition at discharge: good  The results of significant diagnostics from this hospitalization (including imaging, microbiology, ancillary and laboratory) are listed below for reference.   Imaging Studies: PERIPHERAL VASCULAR CATHETERIZATION Result Date: 10/19/2024 See surgical note for result.  US  Venous Img Lower Unilateral Right Result Date: 10/18/2024 EXAM: ULTRASOUND DUPLEX OF THE RIGHT LOWER EXTREMITY VEINS TECHNIQUE: Duplex ultrasound using B-mode/gray scaled imaging and Doppler spectral analysis and color flow was obtained of the deep venous structures of the right lower extremity. COMPARISON: 02/19/2023 CLINICAL HISTORY: pain, hx of stent FINDINGS: The common femoral vein, femoral vein, popliteal vein, and posterior tibial vein demonstrate normal compressibility with normal color flow and spectral analysis. The partially imaged right superficial femoral artery stent is occluded with no arterial flow. The distal superficial femoral artery is patent with dampened, monophasic waveforms. IMPRESSION: 1. No evidence of DVT in the right lower extremity. 2. Occluded partially imaged right superficial femoral artery stent with distal reconstitution. Recommended dedicated arterial imaging for further evaluation as clinically indicated. Electronically signed by: Michaeline Blanch MD 10/18/2024 11:40 AM EST RP Workstation: [id number]    Microbiology: Results for orders placed or performed in visit on 05/23/21  WET PREP FOR TRICH, YEAST, CLUE     Status: None   Collection Time: 05/23/21  4:38 PM  Result Value Ref Range Status   Trichomonas Exam Negative Negative Final   Yeast Exam Negative Negative Final   Clue Cell Exam Comment: Negative Final    Comment: NEG;AMINE NEG     Labs: CBC: Recent Labs  Lab 10/18/24 1326 10/19/24 0509 10/20/24 0507  WBC 11.2* 12.8* 14.9*  NEUTROABS 6.5  --  9.6*  HGB 12.3 11.8* 11.9*  HCT 36.9 35.8* 35.9*  MCV 81.8 81.9 82.2  PLT 538* 563* 573*   Basic Metabolic Panel: Recent Labs  Lab 10/18/24 1326 10/19/24 0509 10/20/24 0507  NA 141 141 140  K 3.3* 3.9 3.9  CL 103 105 102  CO2 24 23 25   GLUCOSE 158* 119* 126*  BUN 14 10 11   CREATININE 0.64 0.66 0.68  CALCIUM  9.5 9.2 9.5  MG 2.3  --   --    Liver Function Tests: Recent Labs  Lab 10/18/24 1326  AST 18  ALT 12  ALKPHOS 118  BILITOT 0.2  PROT 8.1  ALBUMIN 4.5   CBG: Recent Labs  Lab 10/19/24 0800 10/19/24 1742 10/19/24 2104 10/20/24 0825 10/20/24 1208  GLUCAP 123* 108* 111* 94 106*    Discharge time spent:  36 minutes.  Signed: Drue ONEIDA Potter, MD [facility] 10/20/2024 "

## 2024-10-20 NOTE — Progress Notes (Signed)
 Discharge instructions were reviewed with patient. Questions were encouraged and answered. IV was removed, and belongings collected by patient. Patient is waiting on her ride to get here.

## 2024-10-20 NOTE — Plan of Care (Signed)

## 2024-10-21 ENCOUNTER — Telehealth: Payer: Self-pay

## 2024-10-21 DIAGNOSIS — Z0189 Encounter for other specified special examinations: Secondary | ICD-10-CM

## 2024-10-21 NOTE — Telephone Encounter (Signed)
 Transition Care Management Follow-up Telephone Call Date of discharge and from where: [facility] 10/20/24 How have you been since you were released from the hospital? FEELING BETTER Any questions or concerns? Yes- needs bloodwork done (see routing comments)- also, they put her on eliquis 5mg  and d/c plavix   Items Reviewed: Did the pt receive and understand the discharge instructions provided? Yes  Medications obtained and verified? Yes  Other? No  Any new allergies since your discharge? No  Dietary orders reviewed? No Do you have support at home? Yes   Home Care and Equipment/Supplies: Were home health services ordered? no If so, what is the name of the agency?     Functional Questionnaire: (I = Independent and D = Dependent) ADLs: I  Bathing/Dressing- I  Meal Prep- I  Eating- I  Maintaining continence- I  Transferring/Ambulation- I  Managing Meds- I  Follow up appointments reviewed:  PCP Hospital f/u appt confirmed? Yes  Scheduled to see Thersia Stark on 11/17/24 @ 1:50 pm. Are transportation arrangements needed? No  If their condition worsens, is the pt aware to call PCP or go to the Emergency Dept.? Yes Was the patient provided with contact information for the PCP's office or ED? Yes Was to pt encouraged to call back with questions or concerns? Yes

## 2024-10-28 ENCOUNTER — Other Ambulatory Visit (HOSPITAL_BASED_OUTPATIENT_CLINIC_OR_DEPARTMENT_OTHER): Payer: Self-pay | Admitting: Family Medicine

## 2024-10-31 NOTE — Addendum Note (Signed)
 Addended by: Jasaiah Karwowski ELIZABETH on: 10/31/2024 11:27 AM   Modules accepted: Orders

## 2024-10-31 NOTE — Telephone Encounter (Addendum)
 Labs ordered for this patient as requested from hospital follow up. I have placed them as active so the patient is able to come and get them whenever she is able prior to her appointment with alexis on 1/29  11/17/2024 - patient came in office to get labs drawn and told us  insurance would not cover Hemoglobin A1C. Lab cancelled per patient request. Caudle, NP aware.

## 2024-11-11 ENCOUNTER — Encounter: Payer: Self-pay | Admitting: Internal Medicine

## 2024-11-15 ENCOUNTER — Telehealth: Payer: Self-pay | Admitting: Pharmacy Technician

## 2024-11-15 NOTE — Telephone Encounter (Signed)
 Patient populated back to the referral que. I have completed a BIV and [employer] is still active and shara is approved until 07/2025.  Luke

## 2024-11-17 ENCOUNTER — Telehealth: Payer: Self-pay

## 2024-11-17 ENCOUNTER — Ambulatory Visit (INDEPENDENT_AMBULATORY_CARE_PROVIDER_SITE_OTHER): Admitting: Family Medicine

## 2024-11-17 VITALS — BP 132/84 | HR 65 | Ht 69.8 in | Wt 167.9 lb

## 2024-11-17 DIAGNOSIS — E1151 Type 2 diabetes mellitus with diabetic peripheral angiopathy without gangrene: Secondary | ICD-10-CM

## 2024-11-17 DIAGNOSIS — R040 Epistaxis: Secondary | ICD-10-CM | POA: Diagnosis not present

## 2024-11-17 DIAGNOSIS — G8929 Other chronic pain: Secondary | ICD-10-CM

## 2024-11-17 DIAGNOSIS — L84 Corns and callosities: Secondary | ICD-10-CM | POA: Diagnosis not present

## 2024-11-17 DIAGNOSIS — I739 Peripheral vascular disease, unspecified: Secondary | ICD-10-CM | POA: Diagnosis not present

## 2024-11-17 DIAGNOSIS — M79671 Pain in right foot: Secondary | ICD-10-CM | POA: Diagnosis not present

## 2024-11-17 DIAGNOSIS — E782 Mixed hyperlipidemia: Secondary | ICD-10-CM | POA: Diagnosis not present

## 2024-11-17 DIAGNOSIS — I70221 Atherosclerosis of native arteries of extremities with rest pain, right leg: Secondary | ICD-10-CM

## 2024-11-17 MED ORDER — BLOOD GLUCOSE MONITORING SUPPL DEVI: 1.0000 | 0 refills | Status: AC

## 2024-11-17 MED ORDER — LANCETS MISC: 1.0000 | 3 refills | Status: AC

## 2024-11-17 MED ORDER — LANCET DEVICE MISC: 1.0000 | 0 refills | Status: AC

## 2024-11-17 MED ORDER — DAPAGLIFLOZIN PROPANEDIOL 10 MG PO TABS: 10.0000 mg | ORAL_TABLET | Freq: Every day | ORAL | 3 refills | Status: AC

## 2024-11-17 MED ORDER — BLOOD GLUCOSE TEST VI STRP: 1.0000 | ORAL_STRIP | 3 refills | Status: AC

## 2024-11-17 NOTE — Progress Notes (Signed)
 Complex Care Management Note  Care Guide Note 11/17/2024 Name: Heather Mahoney MRN: [id number] DOB: 1965-10-24  Heather Mahoney is a 59 y.o. year old female who sees Caudle, Thersia Bitters, FNP for primary care. I reached out to [name] by phone today to offer complex care management services.  [name] was given information about Complex Care Management services today including:   The Complex Care Management services include support from the care team which includes your Nurse Care Manager, Clinical Social Worker, or Pharmacist.  The Complex Care Management team is here to help remove barriers to the health concerns and goals most important to you. Complex Care Management services are voluntary, and the patient may decline or stop services at any time by request to their care team member.   Complex Care Management Consent Status: Patient agreed to services and verbal consent obtained.   Follow up plan:  Telephone appointment with complex care management team member scheduled for:  11/18/24 at 9:00 a.m.   Encounter Outcome:  Patient Scheduled  Dreama [facility] VBCI Assistant Direct Dial: [phone]  Fax: [phone]

## 2024-11-17 NOTE — Progress Notes (Signed)
 "    Subjective:   Heather Mahoney October 25, 1965 11/17/2024  Chief Complaint  Patient presents with   Hospitalization Follow-up    Pt was hospitalized 12/30. Is requesting a referral to podiatry. Also wants to discuss her sinuses as she has had some nosebleeds.    Discussed the use of AI scribe software for clinical note transcription with the patient, who gave verbal consent to proceed.  History of Present Illness Heather Mahoney is a 59 year old female with peripheral artery disease who presents with leg pain and circulation issues.  She has ongoing pain and circulation issues in her right lower leg following a stent placement in 2023. The pain has been worsening had been worsening for approx. 3 weeks before she went to the ER on 10/18/2024. A DVT study showed no blood clots, but there was concern about stent occlusion. She was admitted and placed on a heparin  drip, and her medications were adjusted to include Eliquis , aspirin , and statin therapy, while Plavix  was discontinued.  She underwent a angioplasty on the 31st, which included catheterization and angioplasty of the right leg. However, she continues to experience circulation issues, noting that keeping her leg at an angle sometimes provides relief with a 'fresh wave' of blood flow, tingling, and coolness. She was d/c on 10/20/24 to follow up with vascular surgery outpatient and was changed from Plavix  to Eliquis .   She reports sinus issues, with frequent epistaxis and sinus congestion, which she attributes to the dry winter air and possibly exacerbated by Eliquis . She uses Vaseline to manage nasal dryness but avoids nasal sprays.   She mentions a callus on her right foot, which has been present for 41 years and is causing pain, potentially affecting her gait. She is wanting to obtain a referral for Podiatry. She is using OTC plantar wart treatment on this.   Her social history includes living in [place] , visiting family in [place] , and  planning to attend [employer].   The following portions of the patient's history were reviewed and updated as appropriate: past medical history, past surgical history, family history, social history, allergies, medications, and problem list.   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, right leg (HCC) 11/17/2024   Pre-ulcerative calluses 11/17/2024   Stenosis of peripheral vascular stent, initial encounter 10/18/2024   Superficial femoral artery occlusion 10/18/2024   Type 2 diabetes mellitus without complication, without long-term current use of insulin  (HCC) 09/21/2023   Reactive thrombocytosis 09/21/2023   Anxiety and depression 08/24/2023   Encounter for screening colonoscopy    Adenomatous polyp of colon    History of endovascular stent graft for abdominal aortic aneurysm 04/26/2020   Urticaria 04/26/2020   Precordial chest pain    Atherosclerosis of native arteries of extremity with intermittent claudication 04/01/2019   Coronary artery disease of native artery of native heart with stable angina pectoris 01/26/2019   CAD (coronary artery disease) 01/14/2019   Unstable angina (HCC) 01/13/2019   PAD (peripheral artery disease) 08/30/2018   AAA (abdominal aortic aneurysm) 08/10/2018   Chest pain with moderate risk for cardiac etiology 12/18/2017   Scotoma 12/18/2017   PVC (premature ventricular contraction) 02/12/2016   Essential hypertension, benign 02/12/2016   Tobacco use disorder 02/12/2016   Migraine headache 01/09/2016   Lymphocytosis 12/21/2015  Hypertension 12/12/2015   Mixed hyperlipidemia 12/12/2015   Vitamin D  deficiency 12/12/2015   Mitral regurgitation 12/12/2015   Hypertensive urgency 11/29/2015   Abdominal pulsatile mass 11/29/2015   Past Medical  History:  Diagnosis Date   AAA (abdominal aortic aneurysm) 08/10/2018   Anxiety    Arthritis    CAD (coronary artery disease)    a. PCI to proximal and distal RCA 3/26   Cancer [facility])    Mom grandma   Chest pain 04/09/2019   Chronic kidney disease    Mom   Depression 2023   Diabetes mellitus without complication (HCC)    GERD (gastroesophageal reflux disease)    Heart murmur    MR   Hiatal hernia    Hot flashes    Hyperlipidemia    Hypertension    Myocardial infarction (HCC)    Neuropathy    Thyroid  disease    Mom   Tobacco abuse    a. Started at age 50, quit during 3 pregnancies. b. 1 PPD for a 40 pack-year hx (2019)    Past Surgical History:  Procedure Laterality Date   ABDOMINAL AORTIC ANEURYSM REPAIR     2019   BREAST BIOPSY Right    Multiple biopsies-all benign   BREAST SURGERY  1986   I&D for 'milk duct'   COLONOSCOPY WITH PROPOFOL  N/A 07/07/2022   Procedure: COLONOSCOPY WITH PROPOFOL ;  Surgeon: Therisa Bi, MD;  Location: [facility] ENDOSCOPY;  Service: Gastroenterology;  Laterality: N/A;   CORONARY STENT INTERVENTION N/A 01/13/2019   Procedure: CORONARY STENT INTERVENTION;  Surgeon: Mady Bruckner, MD;  Location: [facility] INVASIVE CV LAB;  Service: Cardiovascular;  Laterality: N/A;   EMBOLECTOMY  08/13/2018   Procedure: Right SFA and profunda femoris Fogarty embolectomy 3  Fogarty embolectomy balloon;  Surgeon: Marea Selinda RAMAN, MD;  Location: [facility] ORS;  Service: Vascular;;   ENDARTERECTOMY FEMORAL Right 08/13/2018   Procedure: Right common femoral, profunda femoris, and superficial femoral artery endarterectomies and patch angioplasty ;  Surgeon: Marea Selinda RAMAN, MD;  Location: [facility] ORS;  Service: Vascular;  Laterality: Right;   ENDOVASCULAR STENT GRAFT (AAA) N/A 08/13/2018   Procedure: ENDOVASCULAR REPAIR/STENT GRAFT;  Surgeon: Jama Cordella MATSU, MD;  Location: [facility] INVASIVE CV LAB;  Service: Cardiovascular;  Laterality: N/A;   LEFT HEART CATH AND CORONARY ANGIOGRAPHY Left  01/13/2019   Procedure: LEFT HEART CATH AND CORONARY ANGIOGRAPHY;  Surgeon: Perla Evalene PARAS, MD;  Location: [facility] INVASIVE CV LAB;  Service: Cardiovascular;  Laterality: Left;   LEFT HEART CATH AND CORONARY ANGIOGRAPHY Left 07/24/2023   Procedure: LEFT HEART CATH AND CORONARY ANGIOGRAPHY;  Surgeon: Darron Deatrice LABOR, MD;  Location: [facility] INVASIVE CV LAB;  Service: Cardiovascular;  Laterality: Left;   LOWER EXTREMITY ANGIOGRAPHY Right 09/08/2022   Procedure: Lower Extremity Angiography;  Surgeon: Marea Selinda RAMAN, MD;  Location: [facility] INVASIVE CV LAB;  Service: Cardiovascular;  Laterality: Right;   LOWER EXTREMITY ANGIOGRAPHY Right 09/15/2022   Procedure: Lower Extremity Angiography;  Surgeon: Marea Selinda RAMAN, MD;  Location: [facility] INVASIVE CV LAB;  Service: Cardiovascular;  Laterality: Right;   LOWER EXTREMITY ANGIOGRAPHY Right 10/19/2024   Procedure: Lower Extremity Angiography;  Surgeon: Marea Selinda RAMAN, MD;  Location: [facility] INVASIVE CV LAB;  Service: Cardiovascular;  Laterality: Right;   SALPINGOOPHORECTOMY Left 2000   Family History  Problem Relation Age of Onset   Breast cancer Mother    Diabetes Mother    Hypertension Mother    Hyperlipidemia Mother    Heart disease Mother  CABG X 4   Asthma Mother 62   Sarcoidosis Mother        In remission   Heart attack Mother    Breast cancer Maternal Grandmother 62   Diabetes Maternal Grandmother    Heart disease Maternal Grandmother    Hyperlipidemia Maternal Grandmother    Outpatient Medications Prior to Visit  Medication Sig Dispense Refill   acetaminophen  (TYLENOL ) 325 MG tablet Take 2 tablets (650 mg total) by mouth every 6 (six) hours as needed for mild pain (pain score 1-3) or fever (or Fever >/= 101). 20 tablet 0   amLODipine  (NORVASC ) 10 MG tablet Take 1 tablet by mouth once daily 90 tablet 2   apixaban  (ELIQUIS ) 5 MG TABS tablet Take 1 tablet (5 mg total) by mouth 2 (two) times daily. 60 tablet 2   aspirin  81 MG EC tablet Take 1 tablet  (81 mg total) by mouth daily. 90 tablet 3   Blood Pressure Monitoring (BLOOD PRESSURE CUFF) MISC Check blood pressure as instructed by your physician 1 each 0   cloNIDine  (CATAPRES ) 0.2 MG tablet Take 1 tablet (0.2 mg total) by mouth in the morning and at bedtime. Take an extra 0.1 mg for BP >160 90 tablet 3   ezetimibe  (ZETIA ) 10 MG tablet Take 1 tablet (10 mg total) by mouth daily. 90 tablet 3   gabapentin  (NEURONTIN ) 600 MG tablet Take 1 tablet (600 mg total) by mouth 3 (three) times daily as needed. 90 tablet 5   losartan  (COZAAR ) 100 MG tablet Take 1 tablet by mouth once daily 90 tablet 2   metoprolol  succinate (TOPROL -XL) 50 MG 24 hr tablet TAKE 1 TABLET BY MOUTH ONCE DAILY.  TAKE WITH OR IMMEDIATELY AFTER A MEAL 90 tablet 2   pantoprazole  (PROTONIX ) 40 MG tablet Take 1 tablet (40 mg total) by mouth daily. 90 tablet 3   rosuvastatin  (CRESTOR ) 40 MG tablet Take 1 tablet (40 mg total) by mouth daily. 90 tablet 3   venlafaxine  (EFFEXOR ) 75 MG tablet Take 1 tablet (75 mg total) by mouth 2 (two) times daily with a meal. 60 tablet 5   dapagliflozin  propanediol (FARXIGA ) 10 MG TABS tablet Take 1 tablet by mouth once daily 90 tablet 0   Facility-Administered Medications Prior to Visit  Medication Dose Route Frequency Provider Last Rate Last Admin   inclisiran (LEQVIO ) injection 284 mg  284 mg Subcutaneous Once Hilty, Vinie BROCKS, MD       Allergies[1]   ROS: A complete ROS was performed with pertinent positives/negatives noted in the HPI. The remainder of the ROS are negative.    Objective:   Today's Vitals   11/17/24 1339  BP: 132/84  Pulse: 65  SpO2: 99%  Weight: 167 lb 14.4 oz (76.2 kg)  Height: 5' 9.8 (1.773 m)    Physical Exam EXTREMITIES: Callus on the fourth metatarsal of the right foot.  GENERAL: Well-appearing, in NAD. Well nourished.  SKIN: Pink, warm and dry. Pre-ulcerative Callus near the fourth metatarsal of the right foot. Head: Normocephalic. NECK: Trachea midline.  Full ROM w/o pain or tenderness.  RESPIRATORY: Chest wall symmetrical. Respirations even and non-labored. Breath sounds clear to auscultation bilaterally.  CARDIAC: S1, S2 present, regular rate and rhythm without murmur or gallops. Peripheral pulses 2+ bilaterally.  MSK: Muscle tone and strength appropriate for age. s w/o tenderness, redness, or swelling.  EXTREMITIES: Without clubbing, cyanosis, or edema. +2 pulses bilateral lower extremities.  NEUROLOGIC: No motor or sensory deficits. Steady, even gait. C2-C12  intact.  PSYCH/MENTAL STATUS: Alert, oriented x 3. Cooperative, appropriate mood and affect.      Assessment & Plan:  1. Chronic foot pain, right (Primary) Discussed OTC callus treatment and placed referral to Podiatry per patient.  - Ambulatory referral to Podiatry  2. PAD (peripheral artery disease) 3. Atherosclerosis of native arteries of extremities with rest pain, right leg [facility]) Reviewed hospital course and reviewed medication changes. Referral for follow up with Vascular surgery placed. Pharmacy assistance due to cost of Eliquis  ($1200) order placed as this could be a barrier to adherence.  - Ambulatory referral to Vascular Surgery - AMB Referral VBCI Care Management   4. Type 2 diabetes mellitus with diabetic peripheral angiopathy without gangrene, without long-term current use of insulin  (HCC) A1C stable. Will provide glucose monitoring supplies. Continue on Farxiga  10mg  and dietary control. Will collect urine albumin and foot exam completed.  - Blood Glucose Monitoring Suppl DEVI; 1 each by Does not apply route as directed. Dispense based on patient and insurance preference. Use up to four times daily as directed. (FOR ICD-10 E10.9, E11.9).  Dispense: 1 each; Refill: 0 - Glucose Blood (BLOOD GLUCOSE TEST STRIPS) STRP; 1 each by Does not apply route as directed. Dispense based on patient and insurance preference. Use up to four times daily as directed. (FOR ICD-10 E10.9,  E11.9).  Dispense: 100 strip; Refill: 3 - Lancet Device MISC; 1 each by Does not apply route as directed. Dispense based on patient and insurance preference. Use up to four times daily as directed. (FOR ICD-10 E10.9, E11.9).  Dispense: 1 each; Refill: 0 - Lancets MISC; 1 each by Does not apply route as directed. Dispense based on patient and insurance preference. Use up to four times daily as directed. (FOR ICD-10 E10.9, E11.9).  Dispense: 100 each; Refill: 3 - Microalbumin / creatinine urine ratio  5. Epistaxis Discusssed contributing factors of cold dry air and Eliquis . Recommend saline nasal gel by [facility] and adding humidifier.   6. Mixed hyperlipidemia Managed by [facility] with Leqvio  infusins. Continue current regmen.   7. Pre-ulcerative calluses Referral placed to Podiatry for concern of recurrent callus formation.    Meds ordered this encounter  Medications   dapagliflozin  propanediol (FARXIGA ) 10 MG TABS tablet    Sig: Take 1 tablet (10 mg total) by mouth daily.    Dispense:  90 tablet    Refill:  3    Supervising Provider:   DE CUBA, RAYMOND J [[id number]]   Blood Glucose Monitoring Suppl DEVI    Sig: 1 each by Does not apply route as directed. Dispense based on patient and insurance preference. Use up to four times daily as directed. (FOR ICD-10 E10.9, E11.9).    Dispense:  1 each    Refill:  0    Supervising Provider:   DE CUBA, RAYMOND J [id number]   Glucose Blood (BLOOD GLUCOSE TEST STRIPS) STRP    Sig: 1 each by Does not apply route as directed. Dispense based on patient and insurance preference. Use up to four times daily as directed. (FOR ICD-10 E10.9, E11.9).    Dispense:  100 strip    Refill:  3    Supervising Provider:   DE CUBA, RAYMOND J [[id number]]   Lancet Device MISC    Sig: 1 each by Does not apply route as directed. Dispense based on patient and insurance preference. Use up to four times daily as directed. (FOR ICD-10 E10.9, E11.9).    Dispense:  1 each  Refill:  0    Supervising Provider:   DE CUBA, RAYMOND J [id number]   Lancets MISC    Sig: 1 each by Does not apply route as directed. Dispense based on patient and insurance preference. Use up to four times daily as directed. (FOR ICD-10 E10.9, E11.9).    Dispense:  100 each    Refill:  3    Supervising Provider:   DE CUBA, RAYMOND J [id number]   Lab Orders         Microalbumin / creatinine urine ratio     No images are attached to the encounter or orders placed in the encounter.  Return in about 6 months (around 05/17/2025) for DIABETES CHECK UP.    Patient to reach out to office if new, worrisome, or unresolved symptoms arise or if no improvement in patient's condition. Patient verbalized understanding and is agreeable to treatment plan. All questions answered to patient's satisfaction.    Heather Schuyler Stark, FNP     [1]  Allergies Allergen Reactions   Strawberry Extract Hives and Swelling   Strawberry Flavoring Agent (Non-Screening) Rash   "

## 2024-11-17 NOTE — Addendum Note (Signed)
 Addended by: Yaser Harvill ELIZABETH on: 11/17/2024 01:28 PM   Modules accepted: Orders

## 2024-11-18 ENCOUNTER — Other Ambulatory Visit: Payer: Self-pay

## 2024-11-18 DIAGNOSIS — I739 Peripheral vascular disease, unspecified: Secondary | ICD-10-CM

## 2024-11-18 LAB — CBC WITH DIFFERENTIAL/PLATELET
Basophils Absolute: 0.1 10*3/uL (ref 0.0–0.2)
Basos: 1 %
EOS (ABSOLUTE): 1.1 10*3/uL — ABNORMAL HIGH (ref 0.0–0.4)
Eos: 8 %
Hematocrit: 36.2 % (ref 34.0–46.6)
Hemoglobin: 11.4 g/dL (ref 11.1–15.9)
Immature Grans (Abs): 0 10*3/uL (ref 0.0–0.1)
Immature Granulocytes: 0 %
Lymphocytes Absolute: 3.6 10*3/uL — ABNORMAL HIGH (ref 0.7–3.1)
Lymphs: 28 %
MCH: 26.6 pg (ref 26.6–33.0)
MCHC: 31.5 g/dL (ref 31.5–35.7)
MCV: 85 fL (ref 79–97)
Monocytes Absolute: 0.7 10*3/uL (ref 0.1–0.9)
Monocytes: 5 %
Neutrophils Absolute: 7.3 10*3/uL — ABNORMAL HIGH (ref 1.4–7.0)
Neutrophils: 58 %
Platelets: 519 10*3/uL — ABNORMAL HIGH (ref 150–450)
RBC: 4.28 x10E6/uL (ref 3.77–5.28)
RDW: 15.7 % — ABNORMAL HIGH (ref 11.7–15.4)
WBC: 12.8 10*3/uL — ABNORMAL HIGH (ref 3.4–10.8)

## 2024-11-18 LAB — COMPREHENSIVE METABOLIC PANEL WITH GFR
ALT: 13 [IU]/L (ref 0–32)
AST: 16 [IU]/L (ref 0–40)
Albumin: 4.4 g/dL (ref 3.8–4.9)
Alkaline Phosphatase: 117 [IU]/L (ref 49–135)
BUN/Creatinine Ratio: 14 (ref 9–23)
BUN: 11 mg/dL (ref 6–24)
Bilirubin Total: 0.4 mg/dL (ref 0.0–1.2)
CO2: 23 mmol/L (ref 20–29)
Calcium: 9.4 mg/dL (ref 8.7–10.2)
Chloride: 101 mmol/L (ref 96–106)
Creatinine, Ser: 0.77 mg/dL (ref 0.57–1.00)
Globulin, Total: 2.5 g/dL (ref 1.5–4.5)
Glucose: 162 mg/dL — ABNORMAL HIGH (ref 70–99)
Potassium: 3.8 mmol/L (ref 3.5–5.2)
Sodium: 140 mmol/L (ref 134–144)
Total Protein: 6.9 g/dL (ref 6.0–8.5)
eGFR: 89 mL/min/{1.73_m2}

## 2024-11-18 LAB — LIPID PANEL
Chol/HDL Ratio: 4.3 ratio (ref 0.0–4.4)
Cholesterol, Total: 213 mg/dL — ABNORMAL HIGH (ref 100–199)
HDL: 50 mg/dL
LDL Chol Calc (NIH): 134 mg/dL — ABNORMAL HIGH (ref 0–99)
Triglycerides: 160 mg/dL — ABNORMAL HIGH (ref 0–149)
VLDL Cholesterol Cal: 29 mg/dL (ref 5–40)

## 2024-11-18 LAB — IRON,TIBC AND FERRITIN PANEL
Ferritin: 111 ng/mL (ref 15–150)
Iron Saturation: 18 % (ref 15–55)
Iron: 57 ug/dL (ref 27–159)
Total Iron Binding Capacity: 315 ug/dL (ref 250–450)
UIBC: 258 ug/dL (ref 131–425)

## 2024-11-18 LAB — MICROALBUMIN / CREATININE URINE RATIO
Creatinine, Urine: 87.2 mg/dL
Microalb/Creat Ratio: 4 mg/g{creat} (ref 0–29)
Microalbumin, Urine: 3.5 ug/mL

## 2024-11-18 LAB — MAGNESIUM: Magnesium: 1.9 mg/dL (ref 1.6–2.3)

## 2024-11-18 LAB — TSH: TSH: 1.47 u[IU]/mL (ref 0.450–4.500)

## 2024-11-18 NOTE — Progress Notes (Addendum)
" ° °  11/18/2024 Name: Thanvi RAYANA GEURIN MRN: [id number] DOB: 1966-06-24  Chief Complaint  Patient presents with   Medication Assistance    Heather Mahoney is a 59 y.o. year old female who presented for a telephone visit.   They were referred to the pharmacist by their PCP for assistance in managing medication access.    Subjective:  Care Team: Primary Care Provider: Knute Thersia Bitters, FNP ; Next Scheduled Visit:  Future Appointments  Date Time Provider Department Center  11/21/2024  8:15 AM [facility] 3 [facility] None  05/18/2025  8:30 AM Caudle, Thersia Bitters, FNP [facility] [phone] [place]      Medication Access/Adherence  Current Pharmacy:  [facility] [address]), [place] - [address] [place]) [place] [address] Phone: [phone] Fax: [phone]   Patient reports affordability concerns with their medications: is able to pay $12~ on medications without too much financial stress Patient reports access/transportation concerns to their pharmacy: No is still driving, being cautious with recent ice. Patient reports adherence concerns with their medications:  no concerns at this time   Using [facility]- happy with level of service they are receiving, good relationship with pharmacist. Darel we had a misunderstanding of patient being required to pay >$1200 for 90 days of Eliquis      Objective:  Lab Results  Component Value Date   HGBA1C 6.2 (H) 10/19/2024    Lab Results  Component Value Date   CREATININE 0.77 11/17/2024   BUN 11 11/17/2024   NA 140 11/17/2024   K 3.8 11/17/2024   CL 101 11/17/2024   CO2 23 11/17/2024    Lab Results  Component Value Date   CHOL 213 (H) 11/17/2024   HDL 50 11/17/2024   LDLCALC 134 (H) 11/17/2024   LDLDIRECT 185 (H) 12/06/2018   TRIG 160 (H) 11/17/2024   CHOLHDL 4.3 11/17/2024     Assessment/Plan:   Medication Access/Adherence - reviewed rx insurance plan information  - discussed copays, copay waiver if needed. Has good understanding of plan benefits, including option for transportation if needed. Follow Up Plan: f/u PRN, no apparent financial needs at this time as it related to medications.   Lang Sieve, PharmD, BCGP Clinical Pharmacist  [phone]    "

## 2024-11-21 ENCOUNTER — Ambulatory Visit

## 2024-11-21 ENCOUNTER — Ambulatory Visit (HOSPITAL_BASED_OUTPATIENT_CLINIC_OR_DEPARTMENT_OTHER): Payer: Self-pay | Admitting: Family Medicine

## 2024-11-23 ENCOUNTER — Ambulatory Visit (HOSPITAL_BASED_OUTPATIENT_CLINIC_OR_DEPARTMENT_OTHER): Payer: Self-pay | Admitting: Family Medicine

## 2024-11-23 NOTE — Progress Notes (Signed)
 Hi Heather Mahoney,  Your Cmp is stable. Your total and Ldl cholesterol have improved but please continue your cholesterol management with the Leqvio  by the lipid clinic to continue progress towards the goal of controlled LDL. Your thyroid  function and iron are stable. Please continue a good diabetic diet as discussed. I am concerned that your platelet count and WBC count are continuing to elevate. Given your history of reactive thrombocytopenia and lymphocytosis, I do believe evaluation and surveillance with Hematology would be advisable. If you are agreeable to establish care with Hematology, please let me know.

## 2024-11-24 NOTE — Telephone Encounter (Signed)
 Please see mychart message sent by pt and advise.

## 2024-11-29 ENCOUNTER — Ambulatory Visit

## 2025-05-18 ENCOUNTER — Ambulatory Visit (HOSPITAL_BASED_OUTPATIENT_CLINIC_OR_DEPARTMENT_OTHER): Admitting: Family Medicine
